# Patient Record
Sex: Female | Born: 1950 | Race: White | Hispanic: No | Smoking: Former smoker
Health system: Southern US, Community
[De-identification: ages and names within clinical notes are randomized; demographics above are authoritative.]

## PROBLEM LIST (undated history)

## (undated) DIAGNOSIS — K635 Polyp of colon: Secondary | ICD-10-CM

## (undated) DIAGNOSIS — A64 Unspecified sexually transmitted disease: Secondary | ICD-10-CM

## (undated) DIAGNOSIS — M1990 Unspecified osteoarthritis, unspecified site: Secondary | ICD-10-CM

## (undated) DIAGNOSIS — F419 Anxiety disorder, unspecified: Secondary | ICD-10-CM

## (undated) DIAGNOSIS — E7800 Pure hypercholesterolemia, unspecified: Secondary | ICD-10-CM

## (undated) DIAGNOSIS — J449 Chronic obstructive pulmonary disease, unspecified: Secondary | ICD-10-CM

## (undated) DIAGNOSIS — T7840XA Allergy, unspecified, initial encounter: Secondary | ICD-10-CM

## (undated) DIAGNOSIS — S20219A Contusion of unspecified front wall of thorax, initial encounter: Secondary | ICD-10-CM

## (undated) DIAGNOSIS — N890 Mild vaginal dysplasia: Secondary | ICD-10-CM

## (undated) DIAGNOSIS — F988 Other specified behavioral and emotional disorders with onset usually occurring in childhood and adolescence: Secondary | ICD-10-CM

## (undated) DIAGNOSIS — F329 Major depressive disorder, single episode, unspecified: Secondary | ICD-10-CM

## (undated) DIAGNOSIS — F1910 Other psychoactive substance abuse, uncomplicated: Secondary | ICD-10-CM

## (undated) DIAGNOSIS — F0390 Unspecified dementia without behavioral disturbance: Secondary | ICD-10-CM

## (undated) DIAGNOSIS — D649 Anemia, unspecified: Secondary | ICD-10-CM

## (undated) DIAGNOSIS — F32A Depression, unspecified: Secondary | ICD-10-CM

## (undated) DIAGNOSIS — H269 Unspecified cataract: Secondary | ICD-10-CM

## (undated) DIAGNOSIS — B029 Zoster without complications: Secondary | ICD-10-CM

## (undated) HISTORY — DX: Pure hypercholesterolemia, unspecified: E78.00

## (undated) HISTORY — DX: Unspecified cataract: H26.9

## (undated) HISTORY — DX: Anxiety disorder, unspecified: F41.9

## (undated) HISTORY — DX: Allergy, unspecified, initial encounter: T78.40XA

## (undated) HISTORY — DX: Mild vaginal dysplasia: N89.0

## (undated) HISTORY — PX: ACHILLES TENDON SURGERY: SHX542

## (undated) HISTORY — DX: Polyp of colon: K63.5

## (undated) HISTORY — DX: Anemia, unspecified: D64.9

## (undated) HISTORY — PX: POLYPECTOMY: SHX149

## (undated) HISTORY — DX: Unspecified osteoarthritis, unspecified site: M19.90

## (undated) HISTORY — PX: TONSILLECTOMY AND ADENOIDECTOMY: SUR1326

## (undated) HISTORY — DX: Other specified behavioral and emotional disorders with onset usually occurring in childhood and adolescence: F98.8

## (undated) HISTORY — DX: Unspecified dementia, unspecified severity, without behavioral disturbance, psychotic disturbance, mood disturbance, and anxiety: F03.90

## (undated) HISTORY — PX: AUGMENTATION MAMMAPLASTY: SUR837

## (undated) HISTORY — DX: Depression, unspecified: F32.A

## (undated) HISTORY — DX: Major depressive disorder, single episode, unspecified: F32.9

## (undated) HISTORY — DX: Zoster without complications: B02.9

## (undated) HISTORY — DX: Contusion of unspecified front wall of thorax, initial encounter: S20.219A

## (undated) HISTORY — DX: Unspecified sexually transmitted disease: A64

## (undated) HISTORY — DX: Other psychoactive substance abuse, uncomplicated: F19.10

## (undated) HISTORY — DX: Chronic obstructive pulmonary disease, unspecified: J44.9

## (undated) HISTORY — PX: TUBAL LIGATION: SHX77

---

## 1997-03-05 HISTORY — PX: ABDOMINAL SURGERY: SHX537

## 1997-03-05 HISTORY — PX: ABDOMINAL HYSTERECTOMY: SHX81

## 1997-10-20 ENCOUNTER — Encounter: Admission: RE | Admit: 1997-10-20 | Discharge: 1997-10-20 | Payer: Self-pay | Admitting: Family Medicine

## 1997-10-28 ENCOUNTER — Inpatient Hospital Stay (HOSPITAL_COMMUNITY): Admission: RE | Admit: 1997-10-28 | Discharge: 1997-10-31 | Payer: Self-pay | Admitting: Obstetrics and Gynecology

## 1998-09-13 ENCOUNTER — Other Ambulatory Visit: Admission: RE | Admit: 1998-09-13 | Discharge: 1998-09-13 | Payer: Self-pay | Admitting: Obstetrics and Gynecology

## 1999-05-08 ENCOUNTER — Encounter: Payer: Self-pay | Admitting: Obstetrics and Gynecology

## 1999-05-08 ENCOUNTER — Encounter: Admission: RE | Admit: 1999-05-08 | Discharge: 1999-05-08 | Payer: Self-pay | Admitting: Obstetrics and Gynecology

## 1999-09-13 ENCOUNTER — Other Ambulatory Visit: Admission: RE | Admit: 1999-09-13 | Discharge: 1999-09-13 | Payer: Self-pay | Admitting: Obstetrics and Gynecology

## 2000-09-24 ENCOUNTER — Other Ambulatory Visit: Admission: RE | Admit: 2000-09-24 | Discharge: 2000-09-24 | Payer: Self-pay | Admitting: Obstetrics and Gynecology

## 2000-11-13 ENCOUNTER — Encounter: Admission: RE | Admit: 2000-11-13 | Discharge: 2000-11-13 | Payer: Self-pay | Admitting: Obstetrics and Gynecology

## 2000-11-13 ENCOUNTER — Encounter: Payer: Self-pay | Admitting: Obstetrics and Gynecology

## 2000-11-19 ENCOUNTER — Encounter: Payer: Self-pay | Admitting: Obstetrics and Gynecology

## 2000-11-19 ENCOUNTER — Encounter: Admission: RE | Admit: 2000-11-19 | Discharge: 2000-11-19 | Payer: Self-pay | Admitting: Obstetrics and Gynecology

## 2001-09-25 ENCOUNTER — Other Ambulatory Visit: Admission: RE | Admit: 2001-09-25 | Discharge: 2001-09-25 | Payer: Self-pay | Admitting: Obstetrics and Gynecology

## 2002-02-05 ENCOUNTER — Ambulatory Visit (HOSPITAL_BASED_OUTPATIENT_CLINIC_OR_DEPARTMENT_OTHER): Admission: RE | Admit: 2002-02-05 | Discharge: 2002-02-05 | Payer: Self-pay | Admitting: Plastic Surgery

## 2002-10-16 ENCOUNTER — Other Ambulatory Visit: Admission: RE | Admit: 2002-10-16 | Discharge: 2002-10-16 | Payer: Self-pay | Admitting: Obstetrics and Gynecology

## 2003-10-18 ENCOUNTER — Other Ambulatory Visit: Admission: RE | Admit: 2003-10-18 | Discharge: 2003-10-18 | Payer: Self-pay | Admitting: Obstetrics and Gynecology

## 2004-03-05 DIAGNOSIS — N890 Mild vaginal dysplasia: Secondary | ICD-10-CM

## 2004-03-05 HISTORY — PX: COLONOSCOPY: SHX174

## 2004-03-05 HISTORY — DX: Mild vaginal dysplasia: N89.0

## 2004-10-24 ENCOUNTER — Other Ambulatory Visit: Admission: RE | Admit: 2004-10-24 | Discharge: 2004-10-24 | Payer: Self-pay | Admitting: Obstetrics and Gynecology

## 2004-11-15 ENCOUNTER — Ambulatory Visit: Payer: Self-pay | Admitting: Pulmonary Disease

## 2004-11-16 ENCOUNTER — Ambulatory Visit: Payer: Self-pay | Admitting: Pulmonary Disease

## 2004-11-29 ENCOUNTER — Other Ambulatory Visit: Admission: RE | Admit: 2004-11-29 | Discharge: 2004-11-29 | Payer: Self-pay | Admitting: Obstetrics and Gynecology

## 2004-12-01 ENCOUNTER — Ambulatory Visit: Payer: Self-pay | Admitting: Gastroenterology

## 2004-12-14 ENCOUNTER — Ambulatory Visit: Payer: Self-pay | Admitting: Gastroenterology

## 2004-12-14 ENCOUNTER — Encounter (INDEPENDENT_AMBULATORY_CARE_PROVIDER_SITE_OTHER): Payer: Self-pay | Admitting: Specialist

## 2005-05-30 ENCOUNTER — Other Ambulatory Visit: Admission: RE | Admit: 2005-05-30 | Discharge: 2005-05-30 | Payer: Self-pay | Admitting: Obstetrics and Gynecology

## 2005-06-22 ENCOUNTER — Ambulatory Visit: Payer: Self-pay | Admitting: Pulmonary Disease

## 2005-07-02 ENCOUNTER — Ambulatory Visit: Payer: Self-pay | Admitting: Pulmonary Disease

## 2005-11-08 ENCOUNTER — Other Ambulatory Visit: Admission: RE | Admit: 2005-11-08 | Discharge: 2005-11-08 | Payer: Self-pay | Admitting: Obstetrics and Gynecology

## 2006-05-21 ENCOUNTER — Other Ambulatory Visit: Admission: RE | Admit: 2006-05-21 | Discharge: 2006-05-21 | Payer: Self-pay | Admitting: Obstetrics and Gynecology

## 2006-09-27 ENCOUNTER — Ambulatory Visit: Payer: Self-pay | Admitting: Pulmonary Disease

## 2006-09-27 LAB — CONVERTED CEMR LAB
ALT: 17 units/L (ref 0–35)
AST: 20 units/L (ref 0–37)
Albumin: 3.4 g/dL — ABNORMAL LOW (ref 3.5–5.2)
Alkaline Phosphatase: 55 units/L (ref 39–117)
BUN: 11 mg/dL (ref 6–23)
CO2: 30 meq/L (ref 19–32)
Calcium: 8.7 mg/dL (ref 8.4–10.5)
Chloride: 106 meq/L (ref 96–112)
Cholesterol: 208 mg/dL (ref 0–200)
Creatinine, Ser: 0.8 mg/dL (ref 0.4–1.2)
Direct LDL: 122.1 mg/dL
GFR calc Af Amer: 96 mL/min
GFR calc non Af Amer: 79 mL/min
Glucose, Bld: 101 mg/dL — ABNORMAL HIGH (ref 70–99)
HDL: 64.1 mg/dL (ref >39.0)
Potassium: 4.6 meq/L (ref 3.5–5.1)
Sodium: 142 meq/L (ref 135–145)
Total Bilirubin: 0.7 mg/dL (ref 0.3–1.2)
Total CHOL/HDL Ratio: 3.2
Total Protein: 6 g/dL (ref 6.0–8.3)
Triglycerides: 90 mg/dL (ref 0–149)
VLDL: 18 mg/dL (ref 0–40)

## 2006-10-09 ENCOUNTER — Ambulatory Visit: Payer: Self-pay | Admitting: Pulmonary Disease

## 2006-10-14 ENCOUNTER — Ambulatory Visit: Payer: Self-pay | Admitting: Family Medicine

## 2006-11-13 ENCOUNTER — Other Ambulatory Visit: Admission: RE | Admit: 2006-11-13 | Discharge: 2006-11-13 | Payer: Self-pay | Admitting: Obstetrics and Gynecology

## 2007-01-24 ENCOUNTER — Ambulatory Visit: Payer: Self-pay | Admitting: Pulmonary Disease

## 2007-01-24 LAB — CONVERTED CEMR LAB
ALT: 19 units/L (ref 0–35)
AST: 21 units/L (ref 0–37)
Albumin: 3.5 g/dL (ref 3.5–5.2)
Alkaline Phosphatase: 60 units/L (ref 39–117)
Bilirubin, Direct: 0.1 mg/dL (ref 0.0–0.3)
Cholesterol: 201 mg/dL (ref 0–200)
Direct LDL: 115.7 mg/dL
HDL: 76.3 mg/dL (ref >39.0)
Total Bilirubin: 0.9 mg/dL (ref 0.3–1.2)
Total CHOL/HDL Ratio: 2.6
Total Protein: 6.2 g/dL (ref 6.0–8.3)
Triglycerides: 95 mg/dL (ref 0–149)
VLDL: 19 mg/dL (ref 0–40)

## 2007-07-05 ENCOUNTER — Encounter: Payer: Self-pay | Admitting: Pulmonary Disease

## 2007-07-05 DIAGNOSIS — F988 Other specified behavioral and emotional disorders with onset usually occurring in childhood and adolescence: Secondary | ICD-10-CM | POA: Insufficient documentation

## 2007-07-05 DIAGNOSIS — L259 Unspecified contact dermatitis, unspecified cause: Secondary | ICD-10-CM

## 2007-07-05 DIAGNOSIS — E7800 Pure hypercholesterolemia, unspecified: Secondary | ICD-10-CM

## 2007-07-05 DIAGNOSIS — Z87898 Personal history of other specified conditions: Secondary | ICD-10-CM

## 2007-10-09 ENCOUNTER — Telehealth: Payer: Self-pay | Admitting: Pulmonary Disease

## 2007-10-15 ENCOUNTER — Ambulatory Visit: Payer: Self-pay | Admitting: Pulmonary Disease

## 2007-10-21 ENCOUNTER — Telehealth: Payer: Self-pay | Admitting: Pulmonary Disease

## 2007-10-22 LAB — CONVERTED CEMR LAB
ALT: 21 units/L (ref 0–35)
AST: 21 units/L (ref 0–37)
Albumin: 3.8 g/dL (ref 3.5–5.2)
Alkaline Phosphatase: 49 units/L (ref 39–117)
BUN: 7 mg/dL (ref 6–23)
Bilirubin, Direct: 0.1 mg/dL (ref 0.0–0.3)
CO2: 31 meq/L (ref 19–32)
Calcium: 9.2 mg/dL (ref 8.4–10.5)
Chloride: 107 meq/L (ref 96–112)
Cholesterol: 173 mg/dL (ref 0–200)
Creatinine, Ser: 0.9 mg/dL (ref 0.4–1.2)
GFR calc Af Amer: 83 mL/min
GFR calc non Af Amer: 69 mL/min
Glucose, Bld: 94 mg/dL (ref 70–99)
HDL: 56.5 mg/dL (ref >39.0)
LDL Cholesterol: 95 mg/dL (ref 0–99)
Potassium: 4.5 meq/L (ref 3.5–5.1)
Sodium: 144 meq/L (ref 135–145)
Total Bilirubin: 0.6 mg/dL (ref 0.3–1.2)
Total CHOL/HDL Ratio: 3.1
Total Protein: 6.2 g/dL (ref 6.0–8.3)
Triglycerides: 107 mg/dL (ref 0–149)
VLDL: 21 mg/dL (ref 0–40)

## 2007-11-14 ENCOUNTER — Other Ambulatory Visit: Admission: RE | Admit: 2007-11-14 | Discharge: 2007-11-14 | Payer: Self-pay | Admitting: Obstetrics and Gynecology

## 2007-11-14 ENCOUNTER — Ambulatory Visit: Payer: Self-pay | Admitting: Obstetrics and Gynecology

## 2007-11-14 ENCOUNTER — Encounter: Payer: Self-pay | Admitting: Obstetrics and Gynecology

## 2007-12-16 ENCOUNTER — Ambulatory Visit: Payer: Self-pay | Admitting: Pulmonary Disease

## 2007-12-21 DIAGNOSIS — M459 Ankylosing spondylitis of unspecified sites in spine: Secondary | ICD-10-CM

## 2007-12-21 DIAGNOSIS — J309 Allergic rhinitis, unspecified: Secondary | ICD-10-CM | POA: Insufficient documentation

## 2007-12-21 DIAGNOSIS — D126 Benign neoplasm of colon, unspecified: Secondary | ICD-10-CM

## 2008-03-04 ENCOUNTER — Telehealth: Payer: Self-pay | Admitting: Pulmonary Disease

## 2008-04-08 ENCOUNTER — Encounter: Payer: Self-pay | Admitting: Pulmonary Disease

## 2008-04-08 ENCOUNTER — Telehealth: Payer: Self-pay | Admitting: Pulmonary Disease

## 2008-07-07 ENCOUNTER — Telehealth: Payer: Self-pay | Admitting: Pulmonary Disease

## 2008-07-12 ENCOUNTER — Encounter: Payer: Self-pay | Admitting: Pulmonary Disease

## 2008-09-15 ENCOUNTER — Telehealth: Payer: Self-pay | Admitting: Pulmonary Disease

## 2008-11-01 ENCOUNTER — Telehealth: Payer: Self-pay | Admitting: Pulmonary Disease

## 2008-11-01 ENCOUNTER — Ambulatory Visit: Payer: Self-pay | Admitting: Family Medicine

## 2008-11-01 ENCOUNTER — Encounter: Payer: Self-pay | Admitting: Pulmonary Disease

## 2008-11-05 ENCOUNTER — Ambulatory Visit: Payer: Self-pay | Admitting: Pulmonary Disease

## 2008-11-22 ENCOUNTER — Ambulatory Visit: Payer: Self-pay | Admitting: Obstetrics and Gynecology

## 2008-11-22 ENCOUNTER — Other Ambulatory Visit: Admission: RE | Admit: 2008-11-22 | Discharge: 2008-11-22 | Payer: Self-pay | Admitting: Obstetrics and Gynecology

## 2008-11-22 ENCOUNTER — Encounter: Payer: Self-pay | Admitting: Obstetrics and Gynecology

## 2008-12-10 ENCOUNTER — Telehealth: Payer: Self-pay | Admitting: Pulmonary Disease

## 2008-12-20 ENCOUNTER — Ambulatory Visit: Payer: Self-pay | Admitting: Pulmonary Disease

## 2008-12-20 LAB — CONVERTED CEMR LAB
ALT: 17 units/L (ref 0–35)
AST: 20 units/L (ref 0–37)
Albumin: 4 g/dL (ref 3.5–5.2)
Alkaline Phosphatase: 62 units/L (ref 39–117)
Bilirubin, Direct: 0.1 mg/dL (ref 0.0–0.3)
Cholesterol: 220 mg/dL — ABNORMAL HIGH (ref 0–200)
Direct LDL: 138.3 mg/dL
HDL: 58 mg/dL (ref >39.00)
Total Bilirubin: 1 mg/dL (ref 0.3–1.2)
Total CHOL/HDL Ratio: 4
Total Protein: 6.8 g/dL (ref 6.0–8.3)
Triglycerides: 179 mg/dL — ABNORMAL HIGH (ref 0.0–149.0)
VLDL: 35.8 mg/dL (ref 0.0–40.0)

## 2008-12-31 ENCOUNTER — Encounter: Payer: Self-pay | Admitting: Pulmonary Disease

## 2009-01-03 ENCOUNTER — Ambulatory Visit: Payer: Self-pay | Admitting: Pulmonary Disease

## 2009-01-03 LAB — CONVERTED CEMR LAB
ALT: 19 units/L (ref 0–35)
AST: 20 units/L (ref 0–37)
Albumin: 4.5 g/dL (ref 3.5–5.2)
Alkaline Phosphatase: 59 units/L (ref 39–117)
BUN: 14 mg/dL (ref 6–23)
Basophils Absolute: 0 10*3/uL (ref 0.0–0.1)
Basophils Relative: 0 % (ref 0.0–3.0)
Bilirubin, Direct: 0.1 mg/dL (ref 0.0–0.3)
CO2: 29 meq/L (ref 19–32)
Calcium: 9.1 mg/dL (ref 8.4–10.5)
Chloride: 102 meq/L (ref 96–112)
Creatinine, Ser: 0.9 mg/dL (ref 0.4–1.2)
Eosinophils Absolute: 0.1 10*3/uL (ref 0.0–0.7)
Eosinophils Relative: 0.5 % (ref 0.0–5.0)
GFR calc non Af Amer: 68.49 mL/min (ref >60)
Glucose, Bld: 88 mg/dL (ref 70–99)
HCT: 47.5 % — ABNORMAL HIGH (ref 36.0–46.0)
Hemoglobin: 16.4 g/dL — ABNORMAL HIGH (ref 12.0–15.0)
Lymphocytes Relative: 15.6 % (ref 12.0–46.0)
Lymphs Abs: 1.9 10*3/uL (ref 0.7–4.0)
MCHC: 34.4 g/dL (ref 30.0–36.0)
MCV: 98.1 fL (ref 78.0–100.0)
Monocytes Absolute: 0.3 10*3/uL (ref 0.1–1.0)
Monocytes Relative: 2.5 % — ABNORMAL LOW (ref 3.0–12.0)
Neutro Abs: 9.8 10*3/uL — ABNORMAL HIGH (ref 1.4–7.7)
Neutrophils Relative %: 81.4 % — ABNORMAL HIGH (ref 43.0–77.0)
Platelets: 258 10*3/uL (ref 150.0–400.0)
Potassium: 4.3 meq/L (ref 3.5–5.1)
RBC: 4.84 M/uL (ref 3.87–5.11)
RDW: 11.4 % — ABNORMAL LOW (ref 11.5–14.6)
Sodium: 142 meq/L (ref 135–145)
TSH: 1.19 microintl units/mL (ref 0.35–5.50)
Total Bilirubin: 1.1 mg/dL (ref 0.3–1.2)
Total Protein: 7.6 g/dL (ref 6.0–8.3)
Vit D, 25-Hydroxy: 40 ng/mL (ref 30–89)
WBC: 12.1 10*3/uL — ABNORMAL HIGH (ref 4.5–10.5)

## 2009-01-04 LAB — CONVERTED CEMR LAB
Cholesterol: 208 mg/dL — ABNORMAL HIGH (ref 0–200)
Direct LDL: 120 mg/dL
HDL: 58 mg/dL (ref >39.00)
Total CHOL/HDL Ratio: 4
Triglycerides: 241 mg/dL — ABNORMAL HIGH (ref 0.0–149.0)
VLDL: 48.2 mg/dL — ABNORMAL HIGH (ref 0.0–40.0)

## 2009-03-31 ENCOUNTER — Telehealth (INDEPENDENT_AMBULATORY_CARE_PROVIDER_SITE_OTHER): Payer: Self-pay | Admitting: *Deleted

## 2009-06-17 ENCOUNTER — Ambulatory Visit: Payer: Self-pay | Admitting: Pulmonary Disease

## 2009-06-21 ENCOUNTER — Telehealth (INDEPENDENT_AMBULATORY_CARE_PROVIDER_SITE_OTHER): Payer: Self-pay | Admitting: *Deleted

## 2009-06-21 LAB — CONVERTED CEMR LAB
ALT: 16 units/L (ref 0–35)
Alkaline Phosphatase: 54 units/L (ref 39–117)
Bilirubin, Direct: 0.1 mg/dL (ref 0.0–0.3)
HDL: 76.2 mg/dL (ref >39.00)
Total Bilirubin: 0.8 mg/dL (ref 0.3–1.2)
Total Protein: 6.6 g/dL (ref 6.0–8.3)

## 2009-09-09 ENCOUNTER — Encounter: Payer: Self-pay | Admitting: Pulmonary Disease

## 2009-09-09 ENCOUNTER — Telehealth (INDEPENDENT_AMBULATORY_CARE_PROVIDER_SITE_OTHER): Payer: Self-pay | Admitting: *Deleted

## 2009-11-29 ENCOUNTER — Other Ambulatory Visit: Admission: RE | Admit: 2009-11-29 | Discharge: 2009-11-29 | Payer: Self-pay | Admitting: Obstetrics and Gynecology

## 2009-11-29 ENCOUNTER — Ambulatory Visit: Payer: Self-pay | Admitting: Obstetrics and Gynecology

## 2009-12-15 ENCOUNTER — Encounter: Payer: Self-pay | Admitting: Pulmonary Disease

## 2009-12-21 ENCOUNTER — Telehealth: Payer: Self-pay | Admitting: Pulmonary Disease

## 2009-12-27 ENCOUNTER — Ambulatory Visit: Payer: Self-pay | Admitting: Pulmonary Disease

## 2010-01-31 ENCOUNTER — Encounter: Payer: Self-pay | Admitting: Gastroenterology

## 2010-03-22 ENCOUNTER — Telehealth: Payer: Self-pay | Admitting: Pulmonary Disease

## 2010-03-24 ENCOUNTER — Telehealth (INDEPENDENT_AMBULATORY_CARE_PROVIDER_SITE_OTHER): Payer: Self-pay | Admitting: *Deleted

## 2010-04-04 NOTE — Progress Notes (Signed)
Summary: adderall  Phone Note Call from Patient Call back at Home Phone [phone]   Caller: Patient Call For: nadel Summary of Call: pt needs 3 rx's for adderall 20mg - mailed to pt's home address (which i have verified). call pt at home # if needed or cell [phone] Initial call taken by: Tivis Ringer, CNA,  December 21, 2009 4:44 PM  Follow-up for Phone Call        3 rx have been printed out for 11-4, 12-4 and 03-08-2010 to not fill before these dates.  rx have been mailed to pt per her request. Randell Loop CMA  December 22, 2009 9:09 AM     New/Updated Medications: ADDERALL XR 20 MG XR24H-CAP (AMPHETAMINE-DEXTROAMPHETAMINE) Take 1 tablet by mouth once a day... **DO NOT FILL BEFORE 01-06-2010** ADDERALL XR 20 MG XR24H-CAP (AMPHETAMINE-DEXTROAMPHETAMINE) Take 1 tablet by mouth once a day... **DO NOT FILL BEFORE 02-05-2010** ADDERALL XR 20 MG XR24H-CAP (AMPHETAMINE-DEXTROAMPHETAMINE) Take 1 tablet by mouth once a day... **DO NOT FILL BEFORE 03-08-2010** Prescriptions: ADDERALL XR 20 MG XR24H-CAP (AMPHETAMINE-DEXTROAMPHETAMINE) Take 1 tablet by mouth once a day... **DO NOT FILL BEFORE 03-08-2010**  #30 x 0   Entered by:   Randell Loop CMA   Authorized by:   Michele Mcalpine MD   Signed by:   Randell Loop CMA on 12/22/2009   Method used:   Print then Give to Patient   RxID:   [id number] ADDERALL XR 20 MG XR24H-CAP (AMPHETAMINE-DEXTROAMPHETAMINE) Take 1 tablet by mouth once a day... **DO NOT FILL BEFORE 02-05-2010**  #30 x 0   Entered by:   Randell Loop CMA   Authorized by:   Michele Mcalpine MD   Signed by:   Randell Loop CMA on 12/22/2009   Method used:   Print then Give to Patient   RxID:   [id number] ADDERALL XR 20 MG XR24H-CAP (AMPHETAMINE-DEXTROAMPHETAMINE) Take 1 tablet by mouth once a day... **DO NOT FILL BEFORE 01-06-2010**  #30 x 0   Entered by:   Randell Loop CMA   Authorized by:   Michele Mcalpine MD   Signed by:   Randell Loop CMA on 12/22/2009   Method used:    Print then Give to Patient   RxID:   [id number] ADDERALL XR 20 MG XR24H-CAP (AMPHETAMINE-DEXTROAMPHETAMINE) Take 1 tablet by mouth once a day... **DO NOT FILL BEFORE 12-06-2009**  #30 x 0   Entered by:   Randell Loop CMA   Authorized by:   Michele Mcalpine MD   Signed by:   Randell Loop CMA on 12/22/2009   Method used:   Print then Give to Patient   RxID:   [id number]  this rx was printed out wrong so this was placed in the recycle bin. Randell Loop CMA  December 22, 2009 9:00 AM

## 2010-04-04 NOTE — Letter (Signed)
Summary: Colonoscopy Date Change Letter  [facility]  [address] [place], [place] [address]   Phone: [phone]  Fax: [phone]      January 31, 2010 MRN: [id number]   Desiree Salinas [address] [place], [place]  [address]   Dear Desiree Salinas,   Previously you were recommended to have a repeat colonoscopy around this time. Your chart was recently reviewed by Dr.Dylanie Quesenberry of [facility]. Follow up colonoscopy is now recommended in 12-2014. This revised recommendation is based on current, nationally recognized guidelines for colorectal cancer screening and polyp surveillance. These guidelines are endorsed by the American Cancer Society, [employer] on Colorectal Cancer as well as numerous other major medical organizations.  Please understand that our recommendation assumes that you do not have any new symptoms such as bleeding, a change in bowel habits, anemia, or significant abdominal discomfort. If you do have any concerning GI symptoms or want to discuss the guideline recommendations, please call to arrange an office visit at your earliest convenience. Otherwise we will keep you in our reminder system and contact you 1-2 months prior to the date listed above to schedule your next colonoscopy.  Thank you,  Barbette Hair. Arlyce Dice, M.D.  [facility] [phone]

## 2010-04-04 NOTE — Miscellaneous (Signed)
  Medications Added ADDERALL XR 20 MG XR24H-CAP (AMPHETAMINE-DEXTROAMPHETAMINE) Take 1 tablet by mouth once a day... **DO NOT FILL BEFORE 10-06-2009** ADDERALL XR 20 MG XR24H-CAP (AMPHETAMINE-DEXTROAMPHETAMINE) Take 1 tablet by mouth once a day... **DO NOT FILL BEFORE 11-06-2009** ADDERALL XR 20 MG XR24H-CAP (AMPHETAMINE-DEXTROAMPHETAMINE) Take 1 tablet by mouth once a day... **DO NOT FILL BEFORE 12-06-2009**       Clinical Lists Changes  Medications: Changed medication from ADDERALL XR 20 MG XR24H-CAP (AMPHETAMINE-DEXTROAMPHETAMINE) Take 1 tablet by mouth once a day... **DO NOT FILL BEFORE 09-05-2009** to ADDERALL XR 20 MG XR24H-CAP (AMPHETAMINE-DEXTROAMPHETAMINE) Take 1 tablet by mouth once a day... **DO NOT FILL BEFORE 10-06-2009** - Signed Changed medication from ADDERALL XR 20 MG XR24H-CAP (AMPHETAMINE-DEXTROAMPHETAMINE) Take 1 tablet by mouth once a day... **DO NOT FILL BEFORE 10-06-2009** to ADDERALL XR 20 MG XR24H-CAP (AMPHETAMINE-DEXTROAMPHETAMINE) Take 1 tablet by mouth once a day... **DO NOT FILL BEFORE 11-06-2009** - Signed Changed medication from ADDERALL XR 20 MG XR24H-CAP (AMPHETAMINE-DEXTROAMPHETAMINE) Take 1 tablet by mouth once a day... **DO NOT FILL BEFORE 11-06-2009** to ADDERALL XR 20 MG XR24H-CAP (AMPHETAMINE-DEXTROAMPHETAMINE) Take 1 tablet by mouth once a day... **DO NOT FILL BEFORE 12-06-2009** - Signed Rx of ADDERALL XR 20 MG XR24H-CAP (AMPHETAMINE-DEXTROAMPHETAMINE) Take 1 tablet by mouth once a day... **DO NOT FILL BEFORE 10-06-2009**;  #30 x 0;  Signed;  Entered by: Michele Mcalpine MD;  Authorized by: Michele Mcalpine MD;  Method used: Print then Give to Patient Rx of ADDERALL XR 20 MG XR24H-CAP (AMPHETAMINE-DEXTROAMPHETAMINE) Take 1 tablet by mouth once a day... **DO NOT FILL BEFORE 11-06-2009**;  #30 x 0;  Signed;  Entered by: Michele Mcalpine MD;  Authorized by: Michele Mcalpine MD;  Method used: Print then Give to Patient Rx of ADDERALL XR 20 MG XR24H-CAP  (AMPHETAMINE-DEXTROAMPHETAMINE) Take 1 tablet by mouth once a day... **DO NOT FILL BEFORE 12-06-2009**;  #30 x 0;  Signed;  Entered by: Michele Mcalpine MD;  Authorized by: Michele Mcalpine MD;  Method used: Print then Give to Patient Observations: Added new observation of LLIMPORTMEDS: completed (09/09/2009 15:00)    Prescriptions: ADDERALL XR 20 MG XR24H-CAP (AMPHETAMINE-DEXTROAMPHETAMINE) Take 1 tablet by mouth once a day... **DO NOT FILL BEFORE 12-06-2009**  #30 x 0   Entered and Authorized by:   Michele Mcalpine MD   Signed by:   Michele Mcalpine MD on 09/09/2009   Method used:   Print then Give to Patient   RxID:   [id number] ADDERALL XR 20 MG XR24H-CAP (AMPHETAMINE-DEXTROAMPHETAMINE) Take 1 tablet by mouth once a day... **DO NOT FILL BEFORE 11-06-2009**  #30 x 0   Entered and Authorized by:   Michele Mcalpine MD   Signed by:   Michele Mcalpine MD on 09/09/2009   Method used:   Print then Give to Patient   RxID:   [id number] ADDERALL XR 20 MG XR24H-CAP (AMPHETAMINE-DEXTROAMPHETAMINE) Take 1 tablet by mouth once a day... **DO NOT FILL BEFORE 10-06-2009**  #30 x 0   Entered and Authorized by:   Michele Mcalpine MD   Signed by:   Michele Mcalpine MD on 09/09/2009   Method used:   Print then Give to Patient   RxID:   [id number]

## 2010-04-04 NOTE — Progress Notes (Signed)
Summary: lab results/ pick up rx's   Phone Note Call from Patient   Caller: Patient Call For: nadel Summary of Call: pt is "swinging by" in about 1 hour to pick up her rx's for adderall (this is already in envelope up front here). she also wants her lab results at this time as well. cell [phone] Initial call taken by: Tivis Ringer, CNA,  June 21, 2009 9:59 AM  Follow-up for Phone Call        Lab results from 4/15 unsigned in EMR.  Pt requesting results and recs.  Pt states she will be coming by to pick up Adderall rxs.  WIll forward message to SN - pls advise.  Thanks! Gweneth Dimitri RN  June 21, 2009 10:07 AM   Additional Follow-up for Phone Call Additional follow up Details #1::        lab results with pts rx up front for her to pick up Randell Loop CMA  June 21, 2009 11:01 AM

## 2010-04-04 NOTE — Progress Notes (Signed)
Summary: refills  Phone Note Call from Patient Call back at Home Phone [phone]   Caller: Patient Call For: Desiree Salinas Summary of Call: 3 rx's adderall xr pt will pick that up and send refill simvastatin  [facility] and does she need blood checked Initial call taken by: Lacinda Axon,  March 31, 2009 5:19 PM  Follow-up for Phone Call        there was a problem with printing out these rx----there were only 3 rx that were actually printed out for this pt and given to SN to sign---adderall rx for do not fill before 04-08-2009, 05-06-2009 and 06-06-2009.  simvastatin was sent to pts pharmacy per her request and she will not need to come in for labs until sometime in march and pt is aware of this also.  will call pt once the rx have been signed. Randell Loop CMA  April 01, 2009 9:18 AM   Additional Follow-up for Phone Call Additional follow up Details #1::        rx are up front and ready to be picked up and pt is aware Randell Loop CMA  April 01, 2009 11:22 AM     New/Updated Medications: ADDERALL XR 20 MG XR24H-CAP (AMPHETAMINE-DEXTROAMPHETAMINE) Take 1 tablet by mouth once a day... **DO NOT FILL BEFORE 06-06-2009** ADDERALL XR 20 MG XR24H-CAP (AMPHETAMINE-DEXTROAMPHETAMINE) Take 1 tablet by mouth once a day... **DO NOT FILL BEFORE 05-06-2009** ADDERALL XR 20 MG XR24H-CAP (AMPHETAMINE-DEXTROAMPHETAMINE) Take 1 tablet by mouth once a day... **DO NOT FILL BEFORE 04-08-2009** Prescriptions: ADDERALL XR 20 MG XR24H-CAP (AMPHETAMINE-DEXTROAMPHETAMINE) Take 1 tablet by mouth once a day... **DO NOT FILL BEFORE 04-08-2009**  #30 x 0   Entered by:   Randell Loop CMA   Authorized by:   Michele Mcalpine MD   Signed by:   Randell Loop CMA on 04/01/2009   Method used:   Print then Give to Patient   RxID:   [id number] ADDERALL XR 20 MG XR24H-CAP (AMPHETAMINE-DEXTROAMPHETAMINE) Take 1 tablet by mouth once a day... **DO NOT FILL BEFORE 05-06-2009**  #30 x 0   Entered by:   Randell Loop CMA  Authorized by:   Michele Mcalpine MD   Signed by:   Randell Loop CMA on 04/01/2009   Method used:   Print then Give to Patient   RxID:   [id number] ADDERALL XR 20 MG XR24H-CAP (AMPHETAMINE-DEXTROAMPHETAMINE) Take 1 tablet by mouth once a day... **DO NOT FILL BEFORE 06-06-2009**  #30 x 0   Entered by:   Randell Loop CMA   Authorized by:   Michele Mcalpine MD   Signed by:   Randell Loop CMA on 04/01/2009   Method used:   Print then Give to Patient   RxID:   [id number] ADDERALL XR 20 MG XR24H-CAP (AMPHETAMINE-DEXTROAMPHETAMINE) Take 1 tablet by mouth once a day... **DO NOT FILL BEFORE 02/05/09**  #30 x 0   Entered by:   Vernie Murders   Authorized by:   Michele Mcalpine MD   Signed by:   Vernie Murders on 04/01/2009   Method used:   Print then Give to Patient   RxID:   [id number] SIMVASTATIN 40 MG  TABS (SIMVASTATIN) Take 1 tablet by mouth once a day  #90 x 3   Entered by:   Vernie Murders   Authorized by:   Michele Mcalpine MD   Signed by:   Vernie Murders on 04/01/2009   Method used:   Electronically to  [facility]* (retail)       [address]       [place], [place]  [address]       Ph: [phone]       Fax: [phone]   RxID:   [id number] ADDERALL XR 20 MG XR24H-CAP (AMPHETAMINE-DEXTROAMPHETAMINE) Take 1 tablet by mouth once a day... **DO NOT FILL BEFORE 02/05/09**  #30 x 0   Entered by:   Vernie Murders   Authorized by:   Michele Mcalpine MD   Signed by:   Vernie Murders on 04/01/2009   Method used:   Print then Give to Patient   RxID:   [id number] ADDERALL XR 20 MG XR24H-CAP (AMPHETAMINE-DEXTROAMPHETAMINE) Take 1 tablet by mouth once a day... **DO NOT FILL BEFORE 02/05/09**  #30 x 0   Entered by:   Vernie Murders   Authorized by:   Michele Mcalpine MD   Signed by:   Vernie Murders on 04/01/2009   Method used:   Print then Give to Patient   RxID:   [id number] ADDERALL XR 20 MG XR24H-CAP (AMPHETAMINE-DEXTROAMPHETAMINE) Take 1 tablet by mouth  once a day... **DO NOT FILL BEFORE 02/05/09**  #30 x 0   Entered by:   Vernie Murders   Authorized by:   Michele Mcalpine MD   Signed by:   Vernie Murders on 04/01/2009   Method used:   Print then Give to Patient   RxID:   [id number]

## 2010-04-04 NOTE — Assessment & Plan Note (Signed)
Summary: flu shot ///kp  Nurse Visit   Allergies: 1)  ! * Latex  Orders Added: 1)  Admin 1st Vaccine [90471] 2)  Flu Vaccine 8yrs + [[address]] Flu Vaccine Consent Questions     Do you have a history of severe allergic reactions to this vaccine? no    Any prior history of allergic reactions to egg and/or gelatin? no    Do you have a sensitivity to the preservative Thimersol? no    Do you have a past history of Guillan-Barre Syndrome? no    Do you currently have an acute febrile illness? no    Have you ever had a severe reaction to latex? no    Vaccine information given and explained to patient? yes    Are you currently pregnant? no    Lot Number:[id number]   Exp Date:09/02/2010   Site Given  Left Deltoid IM   Tammy Sidharth Leverette  December 27, 2009 1:14 PM

## 2010-04-04 NOTE — Progress Notes (Signed)
Summary: adderall  Phone Note Call from Patient Call back at Home Phone [phone]   Caller: Patient Call For: nadel Summary of Call: pt wants her 3 rx's for adderall mailed to her home (i have verified her address).  Initial call taken by: Tivis Ringer, CNA,  September 09, 2009 11:54 AM  Follow-up for Phone Call        pt last seen 12-20-2008, no pending appointments. Please advise. Thanks. Zackery Barefoot CMA  September 09, 2009 11:58 AM    Pt aware that RX's are ready and request that they be mailed to her home address. Done.Reynaldo Minium CMA  September 09, 2009 4:22 PM

## 2010-04-06 NOTE — Progress Notes (Signed)
Summary: adderall rx needs signature  Phone Note From Pharmacy   Caller: [facility]* Call For: Dr. Kriste Basque  Summary of Call: Received a fax from [facility] stating that the rx for adderall with directions to not fill before 03-08-10 did not have an MD signature on it so they could not fill it. I called [facility] to confirm this and they state the other 2 rx given on 12-22-09 did have sig but the 3rd rx did not so they need a new rx for this month. New rx printed and placed on SN look-at to sign. [facility] states we could mail the rx directly to them. We will notify pt once this has been done.  Initial call taken by: Carron Curie CMA,  March 22, 2010 10:13 AM  Follow-up for Phone Call        rx for adderall has been signed and mailed back to [facility]----called and spoke with pt and she is aware of this and scheduled appt for 3-9 for cpx and and will need another rx for the adderall for the month of feb and will get her other rx at her ov. Randell Loop CMA  March 22, 2010 2:09 PM   Additional Follow-up for Phone Call Additional follow up Details #1::        rx has been signed by SN and placed in the mail to the pt Randell Loop CMA  March 22, 2010 2:47 PM     New/Updated Medications: ADDERALL XR 20 MG XR24H-CAP (AMPHETAMINE-DEXTROAMPHETAMINE) Take 1 tablet by mouth once a day... **DO NOT FILL BEFORE 04-08-2010** Prescriptions: ADDERALL XR 20 MG XR24H-CAP (AMPHETAMINE-DEXTROAMPHETAMINE) Take 1 tablet by mouth once a day... **DO NOT FILL BEFORE 04-08-2010**  #30 x 0   Entered by:   Randell Loop CMA   Authorized by:   Michele Mcalpine MD   Signed by:   Randell Loop CMA on 03/22/2010   Method used:   Printed then mailed to ...       [facility]* (retail)       [address]       [place], [place]  [address]       Ph: [phone]       Fax: [phone]   RxID:   [id number] ADDERALL XR 20 MG XR24H-CAP (AMPHETAMINE-DEXTROAMPHETAMINE) Take 1 tablet by mouth  once a day... **DO NOT FILL BEFORE 03-08-2010**  #30 x 0   Entered by:   Carron Curie CMA   Authorized by:   Michele Mcalpine MD   Signed by:   Carron Curie CMA on 03/22/2010   Method used:   Printed then mailed to ...       [facility]* (retail)       [address]       [place], [place]  [address]       Ph: [phone]       Fax: [phone]   RxID:   [id number]

## 2010-04-06 NOTE — Progress Notes (Signed)
  Phone Note From Pharmacy Call back at [phone]   Caller: [facility]* Summary of Call: Jasmine December at [facility] called requesting a rx backdated to 12-23-10 for pt's Adderall.  Rx they filled was not signed by Dr Kriste Basque. Initial call taken by: Abigail Miyamoto RN,  March 24, 2010 1:08 PM  Follow-up for Phone Call        Spoke with Huntley Dec at [facility] and instrcuted that we were unable to back date prescriptions.  They will mail original rx back to Dr Kriste Basque to get his signature and then we are to mail it back to [facility]. Abigail Miyamoto RN  March 24, 2010 1:09 PM

## 2010-04-25 ENCOUNTER — Telehealth (INDEPENDENT_AMBULATORY_CARE_PROVIDER_SITE_OTHER): Payer: Self-pay | Admitting: *Deleted

## 2010-05-02 NOTE — Progress Notes (Signed)
Summary: sinus/ cough<<<RX to pharmacy  Phone Note Call from Patient   Caller: Patient Call For: nadel Summary of Call: pt c/o sinus congestion w/ cough/ yellow mucus x 2.5 wks. also pressure/ pain on face. (grandchildren have been staying with her and have all had the same thing for about 5 wks). pt has been taking allergy tabs as well as using saline spray. has also had "low grade" fevers "off and on" for the past few days. requests rx- [facility]. pt cell [phone] Initial call taken by: Tivis Ringer, CNA,  April 25, 2010 1:49 PM  Follow-up for Phone Call        Pt c/o sinus pain and pressure for 2 1/2 weeks.  Cough is prod w/ yellow sputum.  Low grade fever for a few days off and on.  She denies any sob or wheezing. Pls advise.Michel Bickers [facility]  April 25, 2010 3:03 PM  Additional Follow-up for Phone Call Additional follow up Details #1::        per SN---ok for pt to have augmentin 875mg    #14 1 by mouth two times a day and pred dosepak 5mg    6 day pack take as directed with no refills.   Randell Loop CMA  April 25, 2010 4:45 PM   Pt is aware of SN recs and will call if her sxs do not improve or get worse.Michel Bickers CMA  April 25, 2010 4:54 PM    New/Updated Medications: PREDNISONE (PAK) 5 MG TABS (PREDNISONE) Take 6 day pack as directed AUGMENTIN 875-125 MG TABS (AMOXICILLIN-POT CLAVULANATE) 1 by mouth two times a day for 7 days Prescriptions: AUGMENTIN 875-125 MG TABS (AMOXICILLIN-POT CLAVULANATE) 1 by mouth two times a day for 7 days  #14 x 0   Entered by:   Michel Bickers CMA   Authorized by:   Michele Mcalpine MD   Signed by:   Michel Bickers CMA on 04/25/2010   Method used:   Electronically to        [facility]* (retail)       [address]       [place], [place]  [address]       Ph: [phone]       Fax: [phone]   RxID:   [id number] PREDNISONE (PAK) 5 MG TABS (PREDNISONE) Take 6 day pack as directed  #1 x 0   Entered by:   Michel Bickers CMA   Authorized by:   Michele Mcalpine MD   Signed by:   Michel Bickers CMA on 04/25/2010   Method used:   Electronically to        [facility]* (retail)       [address]       [place], [place]  [id number]       Ph: [phone]       Fax: [phone]   RxID:   [id number]

## 2010-05-08 ENCOUNTER — Telehealth (INDEPENDENT_AMBULATORY_CARE_PROVIDER_SITE_OTHER): Payer: Self-pay | Admitting: *Deleted

## 2010-05-10 ENCOUNTER — Other Ambulatory Visit: Payer: BC Managed Care – PPO

## 2010-05-10 ENCOUNTER — Other Ambulatory Visit: Payer: Self-pay | Admitting: Pulmonary Disease

## 2010-05-10 ENCOUNTER — Encounter (INDEPENDENT_AMBULATORY_CARE_PROVIDER_SITE_OTHER): Payer: Self-pay | Admitting: *Deleted

## 2010-05-10 ENCOUNTER — Telehealth: Payer: Self-pay | Admitting: Pulmonary Disease

## 2010-05-10 DIAGNOSIS — Z0000 Encounter for general adult medical examination without abnormal findings: Secondary | ICD-10-CM

## 2010-05-10 DIAGNOSIS — E785 Hyperlipidemia, unspecified: Secondary | ICD-10-CM

## 2010-05-10 LAB — CBC WITH DIFFERENTIAL/PLATELET
Basophils Absolute: 0 10*3/uL (ref 0.0–0.1)
Eosinophils Relative: 2.8 % (ref 0.0–5.0)
HCT: 45.1 % (ref 36.0–46.0)
Lymphocytes Relative: 28.3 % (ref 12.0–46.0)
Monocytes Relative: 7.1 % (ref 3.0–12.0)
Neutrophils Relative %: 61.4 % (ref 43.0–77.0)
Platelets: 261 10*3/uL (ref 150.0–400.0)
WBC: 9.6 10*3/uL (ref 4.5–10.5)

## 2010-05-10 LAB — BASIC METABOLIC PANEL
Calcium: 8.8 mg/dL (ref 8.4–10.5)
Chloride: 107 mEq/L (ref 96–112)
Creatinine, Ser: 0.8 mg/dL (ref 0.4–1.2)
Sodium: 141 mEq/L (ref 135–145)

## 2010-05-10 LAB — LIPID PANEL
Cholesterol: 207 mg/dL — ABNORMAL HIGH (ref 0–200)
Total CHOL/HDL Ratio: 4
VLDL: 40.8 mg/dL — ABNORMAL HIGH (ref 0.0–40.0)

## 2010-05-10 LAB — HEPATIC FUNCTION PANEL
AST: 15 U/L (ref 0–37)
Albumin: 3.5 g/dL (ref 3.5–5.2)
Total Protein: 6 g/dL (ref 6.0–8.3)

## 2010-05-11 ENCOUNTER — Encounter (INDEPENDENT_AMBULATORY_CARE_PROVIDER_SITE_OTHER): Payer: BC Managed Care – PPO | Admitting: Pulmonary Disease

## 2010-05-11 ENCOUNTER — Encounter: Payer: Self-pay | Admitting: Pulmonary Disease

## 2010-05-11 ENCOUNTER — Other Ambulatory Visit: Payer: Self-pay | Admitting: Pulmonary Disease

## 2010-05-11 ENCOUNTER — Ambulatory Visit (INDEPENDENT_AMBULATORY_CARE_PROVIDER_SITE_OTHER)
Admission: RE | Admit: 2010-05-11 | Discharge: 2010-05-11 | Disposition: A | Discharge status: Home / Self Care | Payer: BC Managed Care – PPO | Source: Ambulatory Visit | Attending: Pulmonary Disease | Admitting: Pulmonary Disease

## 2010-05-11 DIAGNOSIS — Z0000 Encounter for general adult medical examination without abnormal findings: Secondary | ICD-10-CM

## 2010-05-12 ENCOUNTER — Encounter: Payer: Self-pay | Admitting: Pulmonary Disease

## 2010-05-16 NOTE — Progress Notes (Signed)
Summary: lab prior to or after CPX  Phone Note Call from Patient   Caller: Patient Call For: nadel Summary of Call: Patient phoned and stated that she was told that she could call to have her lab work entered into the system prior to her appointment but she isnt sure what labs she needs or if she needs to wait until after the appt in case he wants her to have something out of the norm  added. 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 She can be reached at [phone] Initial call taken by: Vedia Coffer,  May 08, 2010 4:21 PM  Follow-up for Phone Call        Pt has ov sched with SN for 3/9 at 10:30 am.  Do you want her to have labs done prior to appt, or can this wait until she is seen? Pls advise thanks Vernie Murders  May 08, 2010 5:36 PM'  Additional Follow-up for Phone Call Additional follow up Details #1::        per SN---ok for pt to have labs prior to appt---lip-bmp-hepat-cbcd-tsh--v70.0.  thanks Randell Loop CMA  May 09, 2010 9:42 AM     Additional Follow-up for Phone Call Additional follow up Details #2::    Spoke with pt .  Order placed in Epic to have labs drawn on 05-10-10. Abigail Miyamoto RN  May 09, 2010 10:18 AM

## 2010-05-16 NOTE — Progress Notes (Signed)
Summary: pt cant take todays appt needs to keep fridays  Phone Note Call from Patient   Caller: Patient Call For: Sherrin Stahle Summary of Call: Patient phoned stated that she just spoke to [facility] and was calling her back she can be reached at [phone] she will be there for about the next 45 minutes. 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  Initial call taken by: Vedia Coffer,  May 10, 2010 9:55 AM  Follow-up for Phone Call        called and spoke with pt and she is aware of appt rescheduled for 9am Randell Loop CMA  May 10, 2010 10:13 AM

## 2010-05-23 NOTE — Assessment & Plan Note (Signed)
Summary: cpx/la   CC:  17 month ROV & CPX....  History of Present Illness: 60 y/o WF here for a follow up visit...    ~  Oct10:  she has had a good year overall and remains on ADDERALL XR 20mg /d which we refill for her regularly, BMD 8/10 was normal, still smokes occas & CXR is clear, FLP on Simva40 was good in 2009 but not as good this yr & we discussed diet Rx (see below)... she notes some prob w/ staying asleep & this may be an issue w/ sleep hygiene- we discussed this & poss strategies to help, she will call if meds needed.   ~  May 11, 2010:  Tameyah remains stable on meds as below, but still smoking  ~1/2ppd & we discussed smoking cessation strategies, offered Chantix, etc;  CXR remains clear w/ min apical pleural thickening noted;  EKG w/ NSR, WNL;  Labs showed FLP not at goal w/ LDL 139 on the Simmva40 & we discussed changing to Lipitor 40mg /d to see if this works better for her (plus low chol/ low fat diet etc);  she sees DrGottsegen for GYN on Estradiol Rx;  she continues on the Adderall therapy which she feels she still benefits from & she requests to continue per our protocol...    Current Problem List:  ALLERGIC RHINITIS (ICD-477.9) - she uses Zyrtek & saline as needed...  ACUTE BRONCHITIS (ICD-466.0) - hx recurrent bronchitis... discussed smoking cessation...  ~  CXR 9/10 showed clear, NAD.Marland Kitchen.  ~  CXR 10/10 showed clear, WNL...  HYPERCHOLESTEROLEMIA (ICD-272.0) - changed to SIMVASTATIN 40mg /d in 2008... tolerates well...  ~  FLP 7/08 on Lip20 showed TChol 208, TG 90, HDL 64, LDL 122... changed to Simva40...  ~  FLP 8/09 showed TChol 173, TG 107, HDL 57, LDL 95... rec> same...  ~  FLP 9/10 showed TChol 220, TG 179, HDL 58, LDL 138... rec> better diiet, same med for now.  ~  FLP 11/10 showed TChol 208, TG 241, HDL 58, LDL 120... reviewed low fat diet recs...  COLONIC POLYPS (ICD-211.3) - colonoscopy 10/06 by Dorris Singh showed 3mm polyp= hyperplastic... follow up planned  3yrs...  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 she is active, exerc regularly, no on-going ortho complaints...  ~  3/12:  she notes left thumb tenosynovitis w/ injection & splint rx per DrSypher.  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 she wishes to continue this med and Rx written...  SHINGLES, HX OF (ICD-V13.8) > we discussed the Shingles vaccine & wrote Rx 3/12...  DERMATITIS (ICD-692.9) - followed by DrTafeen...  Health Maintenance - GYN= DrGottsegen w/ Hyst in 1999... she had a BMD here 8/08 w/ normal TScores... repeat BMD here 8/10 showed TScores +0.2 in Spine, and -0.6 in right FemNeck... Vit D level here 10/10 = 40... continue Calcium, Vits, Vit D 1000/d...   Preventive Screening-Counseling & Management  Alcohol-Tobacco     Smoking Status: current     Packs/Day: 0.5  Allergies: 1)  ! * Latex  Comments:  Nurse/Medical Assistant: The patient's medications and allergies were reviewed with the patient and were updated in the Medication and Allergy Lists.  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 Father alive  ~44 w/ ankylosing spondylitis Mother alive age 59 w/ hx breast cancer  Social History: Reviewed history and no changes required. Smoking Status:  current Packs/Day:  0.5  Review of Systems       The patient complains of joint pain and stiffness.  The patient denies fever, chills, sweats,  anorexia, fatigue, weakness, malaise, weight loss, sleep disorder, blurring, diplopia, eye irritation, eye discharge, vision loss, eye pain, photophobia, earache, ear discharge, tinnitus, decreased hearing, nasal congestion, nosebleeds, sore throat, hoarseness, chest pain, palpitations, syncope, dyspnea on exertion, orthopnea, PND, peripheral edema, cough, dyspnea at rest, excessive sputum, hemoptysis, wheezing, pleurisy, nausea, vomiting, diarrhea, constipation, change in bowel habits, abdominal pain, melena, hematochezia, jaundice, gas/bloating, indigestion/heartburn, dysphagia, odynophagia, dysuria, hematuria, urinary frequency, urinary hesitancy, nocturia, incontinence, back pain, joint swelling, muscle cramps, muscle weakness, arthritis, sciatica, restless legs, leg pain at night, leg pain with exertion, rash, itching, dryness, suspicious lesions, paralysis, paresthesias, seizures, tremors, vertigo, transient blindness, frequent falls, frequent headaches, difficulty walking, depression, anxiety, memory loss, confusion, cold intolerance, heat intolerance, polydipsia, polyphagia, polyuria, unusual weight change, abnormal bruising, bleeding, enlarged lymph nodes, urticaria, allergic rash, hay fever, and recurrent infections.    Vital Signs:  Patient profile:   59 year old female Height:      65.5 inches Weight:      124.25 pounds BMI:     20.44 O2 Sat:      100 % on Room air Temp:     98.6 degrees F oral Pulse rate:   77 / minute BP sitting:   118 / 64  (left arm) Cuff size:   regular  Vitals Entered By: Randell Loop CMA (May 11, 2010 9:01 AM)  O2 Sat at Rest %:  100 O2 Flow:  Room air CC: 17 month ROV & CPX... Is Patient Diabetic? No Pain Assessment Patient in pain? no      Comments meds updated today with pt   Physical Exam  Additional Exam:  WD, WN, 60 y/o WF in NAD... GENERAL:  Alert & oriented; pleasant & cooperative... HEENT:  [place]/AT, EOM-wnl, PERRLA, EACs-clear, TMs-wnl,  NOSE-clear, THROAT-clear & wnl. NECK:  Supple w/ fairROM; no JVD; normal carotid impulses w/o bruits; no thyromegaly or nodules palpated; no lymphadenopathy. CHEST:  Clear to P & A; without wheezes/ rales/ or rhonchi. HEART:  Regular Rhythm; without murmurs/ rubs/ or gallops. ABDOMEN:  Soft & nontender; normal bowel sounds; no organomegaly or masses detected. EXT: without deformities, mild arthritic changes; no varicose veins/ venous insuffic/ or edema. NEURO:  CN's intact; motor testing normal; sensory testing normal; gait normal & balance OK. DERM:  No lesions noted; no rash etc...    Impression & Recommendations:  Problem # 1:  PHYSICAL EXAMINATION (ICD-V70.0)  Orders: 12 Lead EKG (12 Lead EKG) T-2 View CXR (71020TC) Labs done 05/10/10 & reviewed w/ pt...  Problem # 2:  ACUTE BRONCHITIS (ICD-466.0) No recent resp exac, but she needs to quit all the smoking & we discussed smoking cessation strategies...  Problem # 3:  HYPERCHOLESTEROLEMIA (ICD-272.0) With her TG still >200 we discussed better low fat diet, and w/ her LDL at 139 on the Simva40 we decided to ch to [id number]... Her updated medication list for this problem includes:    Lipitor 40 Mg Tabs (Atorvastatin calcium) .Marland Kitchen... Take 1 tab by mouth once daily.Marland KitchenMarland Kitchen  Problem # 4:  COLONIC POLYPS (ICD-211.3) She had hyperpl polyp in 2006> due for colon from [facility] 10/16...  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 doing satis, very active, just OTC analgesics Prn...  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  Complete Medication List: 1)  Zyrtec Allergy 10 Mg Tabs (Cetirizine hcl) .... Take one tablet by mouth at bedtime 2)  Aspirin 81 Mg Tbec (Aspirin) .... Take 1 tablet by mouth once a day 3)  Lipitor 40 Mg Tabs (Atorvastatin calcium) .... Take 1 tab by mouth once daily.Marland KitchenMarland Kitchen 4)  Estrace 1  Mg Tabs (Estradiol) .... Take 1 tablet by mouth once a day 5)  Adderall Xr 20 Mg Xr24h-cap (Amphetamine-dextroamphetamine) .... Take 1 tablet by mouth once a day... ** do not fill before 07/11/10 ** 6)  Centrum Silver Tabs (Multiple vitamins-minerals) .... Take 1 tablet by mouth once a day 7)  Protegra Cardio Tabs (Multiple vitamins-minerals) .... Take 1 tablet by mouth once a day 8)  Ultravate 0.05 % Crea (Halobetasol propionate) .... As needed 9)  Shingles Vaccine  .... Admin one shingles shot...  Patient Instructions: 1)  Today we updated your med list- see below.... 2)  We decided to change your simvastatin to LIPITOR 40mg  to see if we can improve your Lipid Profile further (let's plan a follow up FLP after you have been on it for 2-3 months- call when ready).Marland KitchenMarland Kitchen 3)  We also refilled your Adderall Rx... 4)  Today we did your follow up CXR & EKG... please call the "phone tree" in a few days for your results.Marland KitchenMarland Kitchen 5)  Call for any problems... Prescriptions: SHINGLES VACCINE Admin one shingles shot...  #1 x 0   Entered and Authorized by:   Michele Mcalpine MD   Signed by:   Michele Mcalpine MD on 05/11/2010   Method used:   Print then Give to Patient   RxID:   [id number] ADDERALL XR 20 MG XR24H-CAP (AMPHETAMINE-DEXTROAMPHETAMINE) Take 1 tablet by mouth once a day... ** DO NOT FILL BEFORE 07/11/10 **  #30 x 0   Entered and Authorized by:   Michele Mcalpine MD   Signed by:   Michele Mcalpine MD on 05/11/2010   Method used:   Print then Give to Patient   RxID:   [id number] ADDERALL XR 20 MG XR24H-CAP (AMPHETAMINE-DEXTROAMPHETAMINE) Take 1 tablet by mouth once a day... ** DO NOT FILL BEFORE 06/11/10 **  #30 x 0   Entered and Authorized by:   Michele Mcalpine MD   Signed by:   Michele Mcalpine MD on 05/11/2010   Method used:   Print then Give to Patient   RxID:   [id number] ADDERALL XR 20 MG XR24H-CAP (AMPHETAMINE-DEXTROAMPHETAMINE) Take 1 tablet by mouth once a day...  #30 x 0   Entered and  Authorized by:   Michele Mcalpine MD   Signed by:   Michele Mcalpine MD on 05/11/2010   Method used:   Print then Give to Patient   RxID:   [id number] LIPITOR 40 MG TABS (ATORVASTATIN CALCIUM) take 1 tab by mouth once daily...  #90 x 4   Entered and Authorized by:   Michele Mcalpine MD   Signed by:   Michele Mcalpine MD on 05/11/2010   Method used:   Print then Give to Patient  RxID:   [id number]

## 2010-07-21 NOTE — Letter (Signed)
October 25, 2006    Desiree Salinas. Desiree Manly, MD  [facility]  [address] [place], [address]  [place], [place] [address]   RE:  Desiree Salinas, Desiree Salinas  MRN:  [id number]  /  DOB:  1950-08-17   Dear Desiree Salinas:   Desiree Salinas is a 60 year old white female whom I follow for general  medical purposes.  She has a history of HLAB-27 positive mild ankylosing  spondylitis (positive family history), and adult ADD for which she takes  Adderall.  She is a smoker with mild bronchitic symptoms in the past.  She recently had some low back pain and a scan that showed bulging  disks.  She has requested that I call you for a neurology appointment  for further evaluation of these symptoms.   I am including copies of all the available data from the [facility] E-  chart, and as always I appreciate your evaluation.   Sincerely,    Sincerely,      Desiree Cloud. Kriste Basque, MD  Electronically Signed    SMN/MedQ  DD: 10/25/2006  DT: 10/26/2006  Job #: [id number]

## 2010-08-18 ENCOUNTER — Telehealth: Payer: Self-pay | Admitting: Pulmonary Disease

## 2010-08-18 NOTE — Telephone Encounter (Signed)
lmomtcb- need to inform her this will need to be addressed when SN comes back to the office 08/21/10.

## 2010-08-18 NOTE — Telephone Encounter (Signed)
PT CALLED BACK- I ADVISED HER RE: MSG. SHE WILL WAIT FOR NURSE TO CALL BACK Monday. Hazel Sams

## 2010-08-21 MED ORDER — AMPHETAMINE-DEXTROAMPHET ER 20 MG PO CP24: ORAL_CAPSULE | ORAL | Status: DC

## 2010-08-21 NOTE — Telephone Encounter (Signed)
Called and spoke with pt and she is aware that rx are ready to be picked up.  Left rx up front for pt

## 2010-08-21 NOTE — Telephone Encounter (Signed)
Per SN last OV note okay to refill per protocol. Rx's placed on SN cart to sign. Please advise pt when ready for pick up. Thanks

## 2010-11-07 ENCOUNTER — Telehealth: Payer: Self-pay | Admitting: Pulmonary Disease

## 2010-11-07 MED ORDER — AMPHETAMINE-DEXTROAMPHET ER 20 MG PO CP24: ORAL_CAPSULE | ORAL | Status: DC

## 2010-11-07 NOTE — Telephone Encounter (Signed)
rx have been printed out for SN to sign and will mail to pts home.

## 2010-11-08 NOTE — Telephone Encounter (Signed)
rx have been printed and placed in the mail to the pt per her request.

## 2010-12-11 ENCOUNTER — Encounter: Payer: Self-pay | Admitting: Gynecology

## 2010-12-11 DIAGNOSIS — N890 Mild vaginal dysplasia: Secondary | ICD-10-CM | POA: Insufficient documentation

## 2010-12-18 ENCOUNTER — Other Ambulatory Visit (HOSPITAL_COMMUNITY)
Admission: RE | Admit: 2010-12-18 | Discharge: 2010-12-18 | Disposition: A | Discharge status: Home / Self Care | Payer: BC Managed Care – PPO | Source: Ambulatory Visit | Attending: Obstetrics and Gynecology | Admitting: Obstetrics and Gynecology

## 2010-12-18 ENCOUNTER — Ambulatory Visit (INDEPENDENT_AMBULATORY_CARE_PROVIDER_SITE_OTHER): Payer: BC Managed Care – PPO | Admitting: Obstetrics and Gynecology

## 2010-12-18 ENCOUNTER — Encounter: Payer: Self-pay | Admitting: Obstetrics and Gynecology

## 2010-12-18 VITALS — BP 120/78 | Ht 64.5 in | Wt 125.0 lb

## 2010-12-18 DIAGNOSIS — Z01419 Encounter for gynecological examination (general) (routine) without abnormal findings: Secondary | ICD-10-CM | POA: Insufficient documentation

## 2010-12-18 DIAGNOSIS — Z780 Asymptomatic menopausal state: Secondary | ICD-10-CM

## 2010-12-18 DIAGNOSIS — N951 Menopausal and female climacteric states: Secondary | ICD-10-CM

## 2010-12-18 DIAGNOSIS — Z23 Encounter for immunization: Secondary | ICD-10-CM

## 2010-12-18 MED ORDER — ESTRADIOL 1 MG PO TABS: 1.0000 mg | ORAL_TABLET | Freq: Every day | ORAL | Status: DC

## 2010-12-18 NOTE — Progress Notes (Signed)
Patient came to see me today for her annual GYN exam. She remains on estradiol with excellent results. She does her bone densities in lab through Dr. Reggie Pile office. She is due for her yearly mammogram. She's having no GYN symptoms.  ROS: 12 systems reviewed done. Patient has hyperlipidemia. Patient is ADD adult onset.patient has a history of colon polyps. Patient has ankylosing spondylitis.  HEENT: Within normal limits. Kennon Portela present Neck: No masses. Supraclavicular lymph nodes: Not enlarged. Breasts: Examined in both sitting and lying position. Symmetrical without skin changes or masses. Abdomen: Soft no masses guarding or rebound. No hernias. Pelvic: External within normal limits. BUS within normal limits. Vaginal examination shows good estrogen effect, no cystocele enterocele or rectocele. Cervix and uterus absent. Adnexa within normal limits. Rectovaginal confirmatory. Extremities within normal limits.  Assessment: Vasomotor symptoms  Plan: Discussed non-oral estrogen for DVT protection. Patient declined. Continue estradiol 1 mg daily. Mammogram.

## 2010-12-20 ENCOUNTER — Other Ambulatory Visit: Payer: Self-pay | Admitting: *Deleted

## 2010-12-20 DIAGNOSIS — Z780 Asymptomatic menopausal state: Secondary | ICD-10-CM

## 2010-12-20 MED ORDER — ESTRADIOL 1 MG PO TABS: 1.0000 mg | ORAL_TABLET | Freq: Every day | ORAL | Status: DC

## 2010-12-20 NOTE — Telephone Encounter (Signed)
Received fax from [facility] stating she doesn't have home delivery benefits yet. So therefore will send the Rx to another pharmacy.

## 2010-12-21 MED ORDER — ESTRADIOL 1 MG PO TABS: 1.0000 mg | ORAL_TABLET | Freq: Every day | ORAL | Status: DC

## 2010-12-21 NOTE — Telephone Encounter (Signed)
Addended by: Richardson Chiquito on: 12/21/2010 09:50 AM   Modules accepted: Orders

## 2010-12-21 NOTE — Telephone Encounter (Signed)
Pt called back after sending Rx to [facility] and stated they changed Mail order services. Rx sent to another Pharmacy today [facility].

## 2011-01-11 ENCOUNTER — Encounter: Payer: Self-pay | Admitting: Obstetrics and Gynecology

## 2011-01-31 ENCOUNTER — Telehealth: Payer: Self-pay | Admitting: Pulmonary Disease

## 2011-01-31 MED ORDER — AMPHETAMINE-DEXTROAMPHET ER 20 MG PO CP24: ORAL_CAPSULE | ORAL | Status: DC

## 2011-01-31 NOTE — Telephone Encounter (Signed)
rx have been printed and placed on SN cart to be signed.  Will place these in the mail per pts request once they have been signed.

## 2011-05-16 ENCOUNTER — Telehealth: Payer: Self-pay | Admitting: Pulmonary Disease

## 2011-05-16 MED ORDER — AMPHETAMINE-DEXTROAMPHET ER 20 MG PO CP24: ORAL_CAPSULE | ORAL | Status: DC

## 2011-05-16 NOTE — Telephone Encounter (Signed)
I spoke with pt and is requesting a refill on her adderrall for 2 months time. I have printed off rx's and pt would like to pick up these rx's when ready. These has been placed on SN cart for signature

## 2011-05-17 NOTE — Telephone Encounter (Signed)
Called and spoke with pt and she is aware of rx left up front for her to pick up.

## 2011-05-21 ENCOUNTER — Telehealth: Payer: Self-pay | Admitting: *Deleted

## 2011-05-21 MED ORDER — AMPHETAMINE-DEXTROAMPHET ER 20 MG PO CP24: ORAL_CAPSULE | ORAL | Status: DC

## 2011-05-21 NOTE — Telephone Encounter (Signed)
Pt called and stated that she never did pick up the last rx that was printed since this was dated wrong.  Pt stated that she is almost out of her adderall and needs the 3 rx printed so she can come by tomorrow and pick these up.  She is needed rx for 3-15, 4-15 and 5-15.  Pt is aware that these have been printed and we will leave them up front for her to pick up in the morning.

## 2011-06-13 ENCOUNTER — Telehealth: Payer: Self-pay | Admitting: Pulmonary Disease

## 2011-06-13 MED ORDER — ATORVASTATIN CALCIUM 40 MG PO TABS: 40.0000 mg | ORAL_TABLET | Freq: Every day | ORAL | Status: DC

## 2011-06-13 NOTE — Telephone Encounter (Signed)
Rx refilled x 1 to last her until May rov. Spoke with pt and made aware that this was done and advised to keep rov for additional rx. Pt verbalized understanding and states nothing further needed.

## 2011-07-16 ENCOUNTER — Ambulatory Visit: Payer: BC Managed Care – PPO | Admitting: Pulmonary Disease

## 2011-08-08 ENCOUNTER — Ambulatory Visit (INDEPENDENT_AMBULATORY_CARE_PROVIDER_SITE_OTHER)
Admission: RE | Admit: 2011-08-08 | Discharge: 2011-08-08 | Disposition: A | Discharge status: Home / Self Care | Payer: BC Managed Care – PPO | Source: Ambulatory Visit | Attending: Pulmonary Disease | Admitting: Pulmonary Disease

## 2011-08-08 ENCOUNTER — Encounter: Payer: Self-pay | Admitting: Pulmonary Disease

## 2011-08-08 ENCOUNTER — Ambulatory Visit (INDEPENDENT_AMBULATORY_CARE_PROVIDER_SITE_OTHER): Payer: BC Managed Care – PPO | Admitting: Pulmonary Disease

## 2011-08-08 VITALS — BP 116/68 | HR 88 | Temp 97.0°F | Ht 65.5 in | Wt 125.6 lb

## 2011-08-08 DIAGNOSIS — F17210 Nicotine dependence, cigarettes, uncomplicated: Secondary | ICD-10-CM | POA: Insufficient documentation

## 2011-08-08 DIAGNOSIS — Z780 Asymptomatic menopausal state: Secondary | ICD-10-CM

## 2011-08-08 DIAGNOSIS — Z0000 Encounter for general adult medical examination without abnormal findings: Secondary | ICD-10-CM

## 2011-08-08 DIAGNOSIS — D126 Benign neoplasm of colon, unspecified: Secondary | ICD-10-CM

## 2011-08-08 DIAGNOSIS — F17200 Nicotine dependence, unspecified, uncomplicated: Secondary | ICD-10-CM

## 2011-08-08 DIAGNOSIS — Z23 Encounter for immunization: Secondary | ICD-10-CM

## 2011-08-08 DIAGNOSIS — E7800 Pure hypercholesterolemia, unspecified: Secondary | ICD-10-CM

## 2011-08-08 MED ORDER — ESTRADIOL 1 MG PO TABS: 1.0000 mg | ORAL_TABLET | Freq: Every day | ORAL | Status: DC

## 2011-08-08 MED ORDER — AMPHETAMINE-DEXTROAMPHET ER 20 MG PO CP24: ORAL_CAPSULE | ORAL | Status: DC

## 2011-08-08 MED ORDER — TETANUS-DIPHTH-ACELL PERTUSSIS 5-2.5-18.5 LF-MCG/0.5 IM SUSP: 0.5000 mL | Freq: Once | INTRAMUSCULAR | Status: DC

## 2011-08-08 MED ORDER — ATORVASTATIN CALCIUM 40 MG PO TABS: 40.0000 mg | ORAL_TABLET | Freq: Every day | ORAL | Status: DC

## 2011-08-08 NOTE — Patient Instructions (Addendum)
Today we updated your med list in our EPIC system...    Continue your current medications the same...    We refilled your meds per request...  Today we did your follow up CXR... Please return to our lab one morning at your convenience for FASTING blood work...    We will call you w/ the results when avail...  Today we gave you the combination Tetanus bvaccine called the TDAP (it should be good for 10 yrs)...  Call for any questions...  Congrats on the birth of your grandson- the big O

## 2011-08-09 ENCOUNTER — Encounter: Payer: Self-pay | Admitting: Pulmonary Disease

## 2011-08-09 NOTE — Progress Notes (Signed)
Subjective:     Patient ID: Desiree Salinas, female   DOB: 1950-12-02, 61 y.o.   MRN: [id number]  HPI 61 y/o WF here for a follow up visit...   ~  Oct10:  she has had a good year overall and remains on ADDERALL XR 20mg /d which we refill for her regularly, BMD 8/10 was normal, still smokes occas & CXR is clear, FLP on Simva40 was good in 2009 but not as good this yr & we discussed diet Rx (see below)... she notes some prob w/ staying asleep & this may be an issue w/ sleep hygiene- we discussed this & poss strategies to help, she will call if meds needed.  ~  May 11, 2010:  [name] remains stable on meds as below, but still smoking ~1/2ppd & we discussed smoking cessation strategies, offered Chantix, etc;  CXR remains clear w/ min apical pleural thickening noted;  EKG w/ NSR, WNL;  Labs showed FLP not at goal w/ LDL 139 on the Simva40 & we discussed changing to Lipitor 40mg /d to see if this works better for her (plus low chol/ low fat diet etc);  she sees DrGottsegen for GYN on Estradiol Rx;  she continues on the Adderall therapy which she feels she still benefits from & she requests to continue per our protocol...  ~  August 08, 2011:  37mo ROV & CPX> [name] has had a good year- Daugh had 2nd child, Desiree Salinas, & lives in [place]; medically she notes some crepitus in knees but still plays tennis & dances w/o pain etc; she's noted some insomnia- able to fall asleep OK but wakes up & unable to fall back to sleep; as noted she is still taking the Adderall for ADD & we discussed it's effect on sleep, don't take too late in the day etc; I'm reluctant to add sleeping meds or Benzo on top of this & she will try OTC meds like TylenolPM, Melatonin, etc...  We reviewed prob list, meds, xrays and labs> see below>>  OK TDAP today... CXR 6/13 showed normal heart size, clear lungs, NAD... LABS 6/13:  FLP- at goals on Lip40;  Chems- wnl;  CBC- wnl;  TSH=1.88;  VitD=17;  UA- ok...   Problem List:  ALLERGIC RHINITIS  (ICD-477.9) - she uses Zyrtek & saline as needed...  ACUTE BRONCHITIS (ICD-466.0) - hx recurrent bronchitis in the past... CIGARETTE SMOKER >> prev ~1/2ppd but she hints that maybe it's less now; we discussed smoking cessation options but she is not interested. ~  CXR 9/10 showed clear, NAD.Marland Kitchen. ~  CXR 10/10 showed clear, WNL.Marland Kitchen. ~  CXR 3/12 remains clear w/ min apical pleural thickening noted. ~  CXR 6/13 showed normal heart size, clear lungs, NAD...  HYPERCHOLESTEROLEMIA (ICD-272.0) - on Simva40 since 2008, she was changed to ATORVASTATIN 40mg /d in 2012> ~  FLP 7/08 on Lip20 showed TChol 208, TG 90, HDL 64, LDL 122... changed to Simva40... ~  FLP 8/09 on Simva40 showed TChol 173, TG 107, HDL 57, LDL 95... rec> same... ~  FLP 9/10 on Simva40 showed TChol 220, TG 179, HDL 58, LDL 138... rec> better diiet, same med for now. ~  FLP 4/11 on Simva40 showed TChol 201, TG 80, HDL 76, LDL 114... Continue same. ~  FLP 3/12 on Simva40 showed TChol 207, TG 204, HDL 57, LDL 139... Rec> ch to [id number]. ~  FLP 6/13 on Lip40 showed TChol TChol 146, TG 103, HDL 73, LDL 52... Continue same.  COLONIC POLYPS (ICD-211.3) - colonoscopy  10/06 by Dorris Singh showed 3mm polyp= hyperplastic... follow up planned 41yrs...  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 she is active, exerc regularly, no on-going ortho complaints... ~  3/12:  she notes left thumb tenosynovitis w/ injection & splint rx per DrSypher; she indicates that she requires periodic injections for this problem. ~  6/13:  she notes some crepitus in knees but no pain 7 continues to play tennis & dance regularly...  VITAMIN D DEFICIENCY >> rec to take ~2000u Vit D supplement OTC daily... ~  Labs 10/10 showed Vit D level = 40... Continue OTC supplement ~1000u daily... ~  Labs 6/13 showed Vit D level = 17... rec to incr Vit D supplement to ~2000u daily...  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  ~  6/13:  she wishes to continue this med and Rx written per our protocol.  SHINGLES, HX OF (ICD-V13.8) > she had a mild outbreak in 2011 in left V1 distrib & was treated by DrTaffeen; we discussed the Shingles vaccine & wrote Rx 3/12==> [facility]  DERMATITIS (ICD-692.9) - followed by DrTafeen...  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 she had a BMD here 8/08 w/ normal TScores... repeat BMD here 8/10 showed TScores +0.2 in Spine, and -0.6 in right FemNeck... Vit D level here 6/13 = 17, rec supplement orally... continue Calcium, Vits, Vit D 2000/d...   Past Surgical History  Procedure Date  . Abdominal surgery 1999    Abdominoplasty  . Abdominal hysterectomy 1999    TAH  . Tonsillectomy and adenoidectomy   . Augmentation mammaplasty     Implants  . Tubal ligation     Outpatient Encounter Prescriptions as of 08/08/2011  Medication Sig Dispense Refill  . amphetamine-dextroamphetamine (ADDERALL XR, 20MG ,) 20 MG 24 hr capsule Take 1 tablet by mouth once a day...  DO NOT FILL BEFORE 10/18/2011  30 capsule  0  . aspirin 81 MG tablet Take 81 mg by mouth daily.        Marland Kitchen atorvastatin (LIPITOR) 40 MG tablet Take 1 tablet (40 mg total) by mouth daily.  90 tablet  3  . Calcium Carbonate-Vitamin D (CALCIUM + D PO) Take by mouth daily.        . Cetirizine HCl (ZYRTEC PO) Take by mouth.        . estradiol (ESTRACE) 1 MG tablet Take 1 tablet (1 mg total) by mouth daily.  90 tablet  4  . Multiple Vitamins-Minerals (PROTEGRA CARDIO PO) Take by mouth.         Facility-Administered Encounter Medications as of 08/08/2011  Medication Dose Route Frequency Provider Last Rate Last Dose  . TDaP (BOOSTRIX) injection 0.5 mL  0.5 mL Intramuscular Once Michele Mcalpine, MD         Allergies  Allergen Reactions  . Latex   . Nickel     Current Medications, Allergies, Past Medical History, Past Surgical History, Family History, and Social History were reviewed in [employer] record.   Review of Systems        The patient complains of joint pain and stiffness.  The patient denies fever, chills, sweats, anorexia, fatigue, weakness, malaise, weight loss, sleep disorder, blurring, diplopia, eye irritation, eye discharge,  vision loss, eye pain, photophobia, earache, ear discharge, tinnitus, decreased hearing, nasal congestion, nosebleeds, sore throat, hoarseness, chest pain, palpitations, syncope, dyspnea on exertion, orthopnea, PND, peripheral edema, cough, dyspnea at rest, excessive sputum, hemoptysis, wheezing, pleurisy, nausea, vomiting, diarrhea, constipation, change in bowel habits, abdominal pain, melena, hematochezia, jaundice, gas/bloating, indigestion/heartburn, dysphagia, odynophagia, dysuria, hematuria, urinary frequency, urinary hesitancy, nocturia, incontinence, back pain, joint swelling, muscle cramps, muscle weakness, arthritis, sciatica, restless legs, leg pain at night, leg pain with exertion, rash, itching, dryness, suspicious lesions, paralysis, paresthesias, seizures, tremors, vertigo, transient blindness, frequent falls, frequent headaches, difficulty walking, depression, anxiety, memory loss, confusion, cold intolerance, heat intolerance, polydipsia, polyphagia, polyuria, unusual weight change, abnormal bruising, bleeding, enlarged lymph nodes, urticaria, allergic rash, hay fever, and recurrent infections.     Objective:   Physical Exam     WD, WN, 61 y/o WF in NAD... GENERAL:  Alert & oriented; pleasant & cooperative... HEENT:  [place]/AT, EOM-wnl, PERRLA, EACs-clear, TMs-wnl, NOSE-clear, THROAT-clear & wnl. NECK:  Supple w/ fairROM; no JVD; normal carotid impulses w/o bruits; no thyromegaly or nodules palpated; no  lymphadenopathy. CHEST:  Clear to P & A; without wheezes/ rales/ or rhonchi. HEART:  Regular Rhythm; without murmurs/ rubs/ or gallops. ABDOMEN:  Soft & nontender; normal bowel sounds; no organomegaly or masses detected. EXT: without deformities, mild arthritic changes; no varicose veins/ venous insuffic/ or edema. NEURO:  CN's intact; motor testing normal; sensory testing normal; gait normal & balance OK. DERM:  No lesions noted; no rash etc...  RADIOLOGY DATA:  Reviewed in the EPIC EMR & discussed w/ the patient...  LABORATORY DATA:  Reviewed in the EPIC EMR & discussed w/ the patient...   Assessment:     CPX>>  We gave her the TDAP today (6/13)  AR/ Smoker/ Hx Bronchits>  We discussed smoking cessation options but she is not interested in med rx; f/u CXR remains clear & she is asymptomatic x min cough; remains active w/ tennis & dance...  CHOL>  Controlled on low chol/ low fat diet + ATORVA40 & FLP is improved...  Hx Colon Polyps>  She had 3mm polyp removed 2006 by DrKaplan & it was hyperplastic w/ f/u suggested in 65yrs; clinically stable, no problems, no blood seen, etc...  AS/ HLA-B27 pos>  Father has AS, Suhayla is HLA-B27 pos but w/o other changes & continues to do well...  Vitamin D Defic>  Vit D level is down at 17... rec incr OTC supplement to at least 2000u daily...  ADD>  She remains on ADDERALL 20mg /d which we fill for her according to our [facility] office protocol; she continues to feel benefit from this med & wants to continue as is...  Hx Shingles/ Dermatitis>  She is followed by [facility] & she had the Shingles vaccine in 2012...     Plan:     Patient's Medications  New Prescriptions      VITAMIN D Supplement OTC...  Take 2000u daily  Previous Medications   ASPIRIN 81 MG TABLET    Take 81 mg by mouth daily.     CALCIUM CARBONATE-VITAMIN D (CALCIUM + D PO)    Take by mouth daily.     CETIRIZINE HCL (ZYRTEC PO)    Take by mouth.     MULTIPLE VITAMINS-MINERALS  (PROTEGRA CARDIO PO)    Take by mouth.     SIMVASTATIN (ZOCOR) 40 MG TABLET    OFF Simva40 ==> ON Atorva40   Modified Medications = REFILLS   Modified Medication Previous Medication  AMPHETAMINE-DEXTROAMPHETAMINE (ADDERALL XR, 20MG ,) 20 MG 24 HR CAPSULE amphetamine-dextroamphetamine (ADDERALL XR, 20MG ,) 20 MG 24 hr capsule      Take 1 tablet by mouth once a day...  DO NOT FILL BEFORE 10/18/2011    Take 1 tablet by mouth once a day...  DO NOT FILL BEFORE 07/18/2011   ATORVASTATIN (LIPITOR) 40 MG TABLET atorvastatin (LIPITOR) 40 MG tablet      Take 1 tablet (40 mg total) by mouth daily.    Take 1 tablet (40 mg total) by mouth daily.   ESTRADIOL (ESTRACE) 1 MG TABLET estradiol (ESTRACE) 1 MG tablet      Take 1 tablet (1 mg total) by mouth daily.    Take 1 tablet (1 mg total) by mouth daily.  Discontinued Medications   AMPHETAMINE-DEXTROAMPHETAMINE (ADDERALL XR, 20MG ,) 20 MG 24 HR CAPSULE    Take 1 tablet by mouth once a day.  DO NOT FILL BEFORE 06-18-2011

## 2011-08-14 ENCOUNTER — Other Ambulatory Visit (INDEPENDENT_AMBULATORY_CARE_PROVIDER_SITE_OTHER): Payer: BC Managed Care – PPO

## 2011-08-14 DIAGNOSIS — Z0000 Encounter for general adult medical examination without abnormal findings: Secondary | ICD-10-CM

## 2011-08-14 LAB — URINALYSIS, ROUTINE W REFLEX MICROSCOPIC
Ketones, ur: NEGATIVE
Leukocytes, UA: NEGATIVE
Nitrite: NEGATIVE
Specific Gravity, Urine: >=1.03 (ref 1.000–1.030)
pH: 6 (ref 5.0–8.0)

## 2011-08-14 LAB — LIPID PANEL
Cholesterol: 146 mg/dL (ref 0–200)
LDL Cholesterol: 52 mg/dL (ref 0–99)
Triglycerides: 103 mg/dL (ref 0.0–149.0)

## 2011-08-14 LAB — CBC WITH DIFFERENTIAL/PLATELET
Basophils Relative: 0.8 % (ref 0.0–3.0)
Eosinophils Relative: 1.7 % (ref 0.0–5.0)
Lymphocytes Relative: 26.8 % (ref 12.0–46.0)
Monocytes Relative: 6.6 % (ref 3.0–12.0)
Neutrophils Relative %: 64.1 % (ref 43.0–77.0)
RBC: 5.01 Mil/uL (ref 3.87–5.11)
WBC: 9.4 10*3/uL (ref 4.5–10.5)

## 2011-08-14 LAB — BASIC METABOLIC PANEL
BUN: 12 mg/dL (ref 6–23)
CO2: 27 mEq/L (ref 19–32)
Chloride: 109 mEq/L (ref 96–112)
Creatinine, Ser: 0.9 mg/dL (ref 0.4–1.2)

## 2011-08-14 LAB — HEPATIC FUNCTION PANEL
ALT: 15 U/L (ref 0–35)
Total Bilirubin: 0.4 mg/dL (ref 0.3–1.2)
Total Protein: 6.6 g/dL (ref 6.0–8.3)

## 2011-08-15 LAB — VITAMIN D 25 HYDROXY (VIT D DEFICIENCY, FRACTURES): Vit D, 25-Hydroxy: 17 ng/mL — ABNORMAL LOW (ref 30–89)

## 2011-08-16 ENCOUNTER — Other Ambulatory Visit: Payer: Self-pay | Admitting: *Deleted

## 2011-08-16 MED ORDER — VITAMIN D 50 MCG (2000 UT) PO TABS: 2000.0000 [IU] | ORAL_TABLET | Freq: Every day | ORAL | Status: AC

## 2011-11-07 ENCOUNTER — Telehealth: Payer: Self-pay | Admitting: Pulmonary Disease

## 2011-11-07 MED ORDER — AMPHETAMINE-DEXTROAMPHET ER 20 MG PO CP24: ORAL_CAPSULE | ORAL | Status: DC

## 2011-11-07 NOTE — Telephone Encounter (Signed)
Pt last RX was given on 08-08-11 to not fill until 10-18-11. For # 30. Please advise if ok to refill med. Carron Curie, CMA

## 2011-11-07 NOTE — Telephone Encounter (Signed)
Called and spoke with pt and she is aware that rx are up front and ready to be picked up.  Pt voiced her understanding and nothing further is needed.

## 2011-11-07 NOTE — Telephone Encounter (Signed)
rx have been printed out and placed on SN cart to be signed and i will call pt once these are ready.

## 2011-12-19 ENCOUNTER — Encounter: Payer: BC Managed Care – PPO | Admitting: Obstetrics and Gynecology

## 2011-12-24 ENCOUNTER — Encounter: Payer: Self-pay | Admitting: Obstetrics and Gynecology

## 2011-12-24 ENCOUNTER — Other Ambulatory Visit (HOSPITAL_COMMUNITY)
Admission: RE | Admit: 2011-12-24 | Discharge: 2011-12-24 | Disposition: A | Discharge status: Home / Self Care | Payer: BC Managed Care – PPO | Source: Ambulatory Visit | Attending: Obstetrics and Gynecology | Admitting: Obstetrics and Gynecology

## 2011-12-24 ENCOUNTER — Ambulatory Visit (INDEPENDENT_AMBULATORY_CARE_PROVIDER_SITE_OTHER): Payer: BC Managed Care – PPO | Admitting: Obstetrics and Gynecology

## 2011-12-24 VITALS — BP 144/86 | Ht 65.0 in | Wt 120.0 lb

## 2011-12-24 DIAGNOSIS — Z01419 Encounter for gynecological examination (general) (routine) without abnormal findings: Secondary | ICD-10-CM | POA: Insufficient documentation

## 2011-12-24 DIAGNOSIS — N890 Mild vaginal dysplasia: Secondary | ICD-10-CM

## 2011-12-24 DIAGNOSIS — N893 Dysplasia of vagina, unspecified: Secondary | ICD-10-CM

## 2011-12-24 DIAGNOSIS — Z780 Asymptomatic menopausal state: Secondary | ICD-10-CM

## 2011-12-24 DIAGNOSIS — Z1151 Encounter for screening for human papillomavirus (HPV): Secondary | ICD-10-CM | POA: Insufficient documentation

## 2011-12-24 MED ORDER — ESTRADIOL 1 MG PO TABS: 1.0000 mg | ORAL_TABLET | Freq: Every day | ORAL | Status: DC

## 2011-12-24 NOTE — Progress Notes (Signed)
Patient came to see me today for her annual GYN exam. She remains on oral estradiol for relief of vasomotor symptoms. She is having no vaginal bleeding. She is having no pelvic pain. She is due for her mammogram next month. In 1999 she had a total abdominal hysterectomy for fibroids. At that time her cervix showed no dysplasia. Prior to surgery she had had normal Pap smears. 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 Colposcopy with biopsy confirmed the vain. She has had yearly normal Pap smears since then. She does her lab and her bone densities through her PCP.  HEENT: Within normal limits.Kennon Portela present. Neck: No masses. Supraclavicular lymph nodes: Not enlarged. Breasts: Examined in both sitting and lying position. Symmetrical without skin changes or masses. Abdomen: Soft no masses guarding or rebound. No hernias. Pelvic: External within normal limits. BUS within normal limits. Vaginal examination shows good estrogen effect, no cystocele enterocele or rectocele. Cervix and uterus absent. Adnexa within normal limits. Rectovaginal confirmatory. Extremities within normal limits.  Assessment: #1.VAIN-1. #2. Menopausal symptoms  Plan: Pap and HPV typing done.The new Pap smear guidelines were discussed with the patient. Mammogram next month. Discussed estrogen patch. Patient declined. Estradiol 1 mg by mouth daily.

## 2011-12-24 NOTE — Patient Instructions (Signed)
Schedule mammogram for November.

## 2011-12-25 LAB — URINALYSIS W MICROSCOPIC + REFLEX CULTURE
Bilirubin Urine: NEGATIVE
Casts: NONE SEEN
Crystals: NONE SEEN
Glucose, UA: NEGATIVE mg/dL
Ketones, ur: NEGATIVE mg/dL
Specific Gravity, Urine: 1.014 (ref 1.005–1.030)
pH: 5.5 (ref 5.0–8.0)

## 2011-12-27 ENCOUNTER — Other Ambulatory Visit: Payer: Self-pay | Admitting: Obstetrics and Gynecology

## 2011-12-27 DIAGNOSIS — N390 Urinary tract infection, site not specified: Secondary | ICD-10-CM

## 2011-12-27 MED ORDER — SULFAMETHOXAZOLE-TRIMETHOPRIM 800-160 MG PO TABS: 1.0000 | ORAL_TABLET | Freq: Two times a day (BID) | ORAL | Status: DC

## 2011-12-28 ENCOUNTER — Encounter: Payer: Self-pay | Admitting: Obstetrics and Gynecology

## 2012-01-08 ENCOUNTER — Encounter: Payer: Self-pay | Admitting: Obstetrics and Gynecology

## 2012-02-13 ENCOUNTER — Telehealth: Payer: Self-pay | Admitting: Pulmonary Disease

## 2012-02-13 MED ORDER — AMPHETAMINE-DEXTROAMPHET ER 20 MG PO CP24: ORAL_CAPSULE | ORAL | Status: DC

## 2012-02-13 NOTE — Telephone Encounter (Signed)
Rx's printed and placed for Dr Kriste Basque to sign.  Verified address with pt and informed that rx was to be signed by Dr Kriste Basque and placed in the mail.

## 2012-04-28 ENCOUNTER — Telehealth: Payer: Self-pay | Admitting: Pulmonary Disease

## 2012-04-28 MED ORDER — AMOXICILLIN-POT CLAVULANATE 875-125 MG PO TABS: 1.0000 | ORAL_TABLET | Freq: Two times a day (BID) | ORAL | Status: DC

## 2012-04-28 NOTE — Telephone Encounter (Signed)
Per SN----  Ok to send in augmentin 875 mg  1 po bid  #14 mucinex 2 po bid and increase fluids Align once daily.  Called and spoke with pt and she is aware of SN recs and of medication sent to the pharmacy---nothing further is needed.

## 2012-04-28 NOTE — Telephone Encounter (Signed)
Spoke with pt  She states that having nasal and chest congestion, prod cough with moderate yellow sputum, and low grade temp x 3 wks Taking mucinex and simply saline without relief Pt denies any SOB, CP, wheezing or chest tightness Please advise, thanks! Allergies  Allergen Reactions  . Latex   . Nickel    Pt last seen here 08/08/11 Next ov not yet pending

## 2012-05-22 ENCOUNTER — Telehealth: Payer: Self-pay | Admitting: Pulmonary Disease

## 2012-05-22 NOTE — Telephone Encounter (Signed)
Last OV 08/08/2011 Was last given a 90 day supply in 02/13/12.  Please advise if this is okay to refill. Thanks.

## 2012-05-23 MED ORDER — AMPHETAMINE-DEXTROAMPHET ER 20 MG PO CP24: ORAL_CAPSULE | ORAL | Status: DC

## 2012-05-23 NOTE — Telephone Encounter (Signed)
rx have been placed in the mail to the pt.  Nothing further is needed.

## 2012-05-23 NOTE — Telephone Encounter (Signed)
rx have been printed out and placed on SN cart to be signed and will mail to the pt once this has been done.

## 2012-08-11 ENCOUNTER — Encounter: Payer: Self-pay | Admitting: Pulmonary Disease

## 2012-08-11 ENCOUNTER — Ambulatory Visit (INDEPENDENT_AMBULATORY_CARE_PROVIDER_SITE_OTHER): Payer: BC Managed Care – PPO | Admitting: Pulmonary Disease

## 2012-08-11 ENCOUNTER — Ambulatory Visit (INDEPENDENT_AMBULATORY_CARE_PROVIDER_SITE_OTHER)
Admission: RE | Admit: 2012-08-11 | Discharge: 2012-08-11 | Disposition: A | Discharge status: Home / Self Care | Payer: BC Managed Care – PPO | Source: Ambulatory Visit | Attending: Pulmonary Disease | Admitting: Pulmonary Disease

## 2012-08-11 VITALS — BP 116/72 | HR 73 | Temp 97.9°F | Ht 64.75 in | Wt 121.2 lb

## 2012-08-11 DIAGNOSIS — F988 Other specified behavioral and emotional disorders with onset usually occurring in childhood and adolescence: Secondary | ICD-10-CM

## 2012-08-11 DIAGNOSIS — L259 Unspecified contact dermatitis, unspecified cause: Secondary | ICD-10-CM

## 2012-08-11 DIAGNOSIS — D126 Benign neoplasm of colon, unspecified: Secondary | ICD-10-CM

## 2012-08-11 DIAGNOSIS — Z0000 Encounter for general adult medical examination without abnormal findings: Secondary | ICD-10-CM

## 2012-08-11 DIAGNOSIS — M459 Ankylosing spondylitis of unspecified sites in spine: Secondary | ICD-10-CM

## 2012-08-11 DIAGNOSIS — F17210 Nicotine dependence, cigarettes, uncomplicated: Secondary | ICD-10-CM

## 2012-08-11 DIAGNOSIS — F17200 Nicotine dependence, unspecified, uncomplicated: Secondary | ICD-10-CM

## 2012-08-11 DIAGNOSIS — E7800 Pure hypercholesterolemia, unspecified: Secondary | ICD-10-CM

## 2012-08-11 DIAGNOSIS — J309 Allergic rhinitis, unspecified: Secondary | ICD-10-CM

## 2012-08-11 MED ORDER — AMPHETAMINE-DEXTROAMPHET ER 20 MG PO CP24: ORAL_CAPSULE | ORAL | Status: DC

## 2012-08-11 MED ORDER — ATORVASTATIN CALCIUM 40 MG PO TABS: 40.0000 mg | ORAL_TABLET | Freq: Every day | ORAL | Status: DC

## 2012-08-11 NOTE — Patient Instructions (Addendum)
Today we updated your med list in our EPIC system...    Continue your current medications the same...    We refilled the meds you requested...  Today we did your follow up CXR... Please return to our lab one morning this week for your FASTING blood work...    We will contact you w/ the results when available...   Keep up the efforts directed toward smoking cessation- you can do it!!!  Call for any questions...  Let's plan a follow up visit in 53yr, sooner if needed for problems.Marland KitchenMarland Kitchen

## 2012-08-19 ENCOUNTER — Encounter: Payer: Self-pay | Admitting: Pulmonary Disease

## 2012-08-19 NOTE — Progress Notes (Signed)
Subjective:     Patient ID: Desiree Salinas, female   DOB: Jul 31, 1950, 62 y.o.   MRN: [id number]  HPI  62 y/o WF here for a follow up visit...   ~  Oct10:  she has had a good year overall and remains on ADDERALL XR 20mg /d which we refill for her regularly, BMD 8/10 was normal, still smokes occas & CXR is clear, FLP on Simva40 was good in 2009 but not as good this yr & we discussed diet Rx (see below)... she notes some prob w/ staying asleep & this may be an issue w/ sleep hygiene- we discussed this & poss strategies to help, she will call if meds needed.  ~  May 11, 2010:  Desiree Salinas remains stable on meds as below, but still smoking ~1/2ppd & we discussed smoking cessation strategies, offered Chantix, etc;  CXR remains clear w/ min apical pleural thickening noted;  EKG w/ NSR, WNL;  Labs showed FLP not at goal w/ LDL 139 on the Simva40 & we discussed changing to Lipitor 40mg /d to see if this works better for her (plus low chol/ low fat diet etc);  she sees DrGottsegen for GYN on Estradiol Rx;  she continues on the Adderall therapy which she feels she still benefits from & she requests to continue per our protocol...  ~  August 08, 2011:  32mo ROV & CPX> Desiree Salinas has had a good year- Daugh had 2nd child, [name], & lives in [place]; medically she notes some crepitus in knees but still plays tennis & dances w/o pain etc; she's noted some insomnia- able to fall asleep OK but wakes up & unable to fall back to sleep; as noted she is still taking the Adderall for ADD & we discussed it's effect on sleep, don't take too late in the day etc; I'm reluctant to add sleeping meds or Benzo on top of this & she will try OTC meds like TylenolPM, Melatonin, etc...  We reviewed prob list, meds, xrays and labs> see below>>  OK TDAP today... CXR 6/13 showed normal heart size, clear lungs, NAD... LABS 6/13:  FLP- at goals on Lip40;  Chems- wnl;  CBC- wnl;  TSH=1.88;  VitD=17;  UA- ok...  ~  August 11, 2012:  Yearly ROV & CPX> Desiree Salinas  has been doing reasonably well but notes incr stress, worries, palpit, insomnia (daughter & grandchild in [place]);  She's been using an E-cig in order to cut back on cigarettes, and we discussed the need to step down the nicotine etc...  We reviewed the following medical problems during today's office visit >>     AR, AB, Smoker> not on breathing meds & we've encouraged smoking cessation efforts...    CHOL> on Lip40; FLP 6/13 showed TChol 148, TG 103, HDL 73, LDL 52... She will ret for f/u FLP.    GI- colon polyps> last colon 10/06 by Dorris Singh w/ one 3mm hyperplastic polyp & f/u planned 80yrs...    GYN- DrGottsegen on Estrace...    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    VitD defic> she had stopped her calcium, MVI, VitD 2000 & advised to re-start these supplements...    ADD> on Adderall20/d & she feels it really helps & that she needs to continue, refilled...    Dermatitis> she has a few ecchymoses, she checks w/ DrTaffeen yearly...  We reviewed prob  list, meds, xrays and labs> see below for updates >>  CXR 6/14 showed norm heart size, clear lungs, NAD... LABS 6/14:  pending...   Problem List:  ALLERGIC RHINITIS (ICD-477.9) - she uses Zyrtek & saline as needed...  ACUTE BRONCHITIS (ICD-466.0) - hx recurrent bronchitis in the past... CIGARETTE SMOKER >> prev ~1/2ppd but she hints that maybe it's less now; we discussed smoking cessation options but she is not interested. ~  CXR 9/10 showed clear, NAD.Marland Kitchen. ~  CXR 10/10 showed clear, WNL.Marland Kitchen. ~  CXR 3/12 remains clear w/ min apical pleural thickening noted. ~  CXR 6/13 showed normal heart size, clear lungs, NAD.Marland Kitchen. ~  CXR 6/14 showed norm heart size, clear lungs, NAD...   HYPERCHOLESTEROLEMIA (ICD-272.0) - on Simva40 since 2008, she was changed to ATORVASTATIN 40mg /d in 2012> ~  FLP 7/08 on Lip20 showed TChol 208, TG 90, HDL 64, LDL 122...  changed to Simva40... ~  FLP 8/09 on Simva40 showed TChol 173, TG 107, HDL 57, LDL 95... rec> same... ~  FLP 9/10 on Simva40 showed TChol 220, TG 179, HDL 58, LDL 138... rec> better diiet, same med for now. ~  FLP 4/11 on Simva40 showed TChol 201, TG 80, HDL 76, LDL 114... Continue same. ~  FLP 3/12 on Simva40 showed TChol 207, TG 204, HDL 57, LDL 139... Rec> ch to [id number]. ~  FLP 6/13 on Lip40 showed TChol TChol 146, TG 103, HDL 73, LDL 52... Continue same. ~  FLP 6/14 on Lip40 => pending  COLONIC POLYPS (ICD-211.3) - colonoscopy 10/06 by Dorris Singh showed 3mm polyp= hyperplastic... follow up planned 56yrs...  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 she is active, exerc regularly, no on-going ortho complaints... ~  3/12:  she notes left thumb tenosynovitis w/ injection & splint rx per DrSypher; she indicates that she requires periodic injections for this problem. ~  6/13:  she notes some crepitus in knees but no pain & continues to play tennis & dance regularly...  VITAMIN D DEFICIENCY >> rec to take ~2000u Vit D supplement OTC daily... ~  Labs 10/10 showed Vit D level = 40... Continue OTC supplement ~1000u daily... ~  Labs 6/13 showed Vit D level = 17... rec to incr Vit D supplement to ~2000u daily...  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  ~  6/13:  she wishes to continue this med and Rx written per our protocol.  SHINGLES, HX OF (ICD-V13.8) > she had a mild outbreak in 2011 in left V1 distrib & was treated by DrTaffeen; we discussed the Shingles vaccine & wrote Rx 3/12==> [facility]  DERMATITIS (ICD-692.9) - followed by DrTafeen...  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 she had a BMD here 8/08  w/ normal TScores... repeat BMD here 8/10 showed TScores +0.2 in Spine, and -0.6 in right FemNeck... Vit D level here 6/13 = 17, rec supplement orally... continue Calcium, Vits, Vit D 2000/d...   Past Surgical History  Procedure Laterality Date  . Abdominal surgery  1999    Abdominoplasty  . Abdominal hysterectomy  1999    TAH  . Tonsillectomy and adenoidectomy    . Augmentation mammaplasty      Implants  . Tubal ligation  Outpatient Encounter Prescriptions as of 08/11/2012  Medication Sig Dispense Refill  . amphetamine-dextroamphetamine (ADDERALL XR) 20 MG 24 hr capsule Take 1 tablet by mouth once a day...  DO NOT FILL BEFORE 11/01/2012  30 capsule  0  . atorvastatin (LIPITOR) 40 MG tablet Take 1 tablet (40 mg total) by mouth daily.  90 tablet  3  . cetirizine (ZYRTEC) 10 MG tablet Take 10 mg by mouth daily as needed for allergies.      . Diphenhydramine-APAP, sleep, (TYLENOL PM EXTRA STRENGTH PO) Take by mouth as needed.       Marland Kitchen estradiol (ESTRACE) 1 MG tablet Take 1 tablet (1 mg total) by mouth daily.  90 tablet  4    Allergies  Allergen Reactions  . Latex   . Nickel     Current Medications, Allergies, Past Medical History, Past Surgical History, Family History, and Social History were reviewed in [employer] record.   Review of Systems         The patient complains of joint pain and stiffness.  The patient denies fever, chills, sweats, anorexia, fatigue, weakness, malaise, weight loss, sleep disorder, blurring, diplopia, eye irritation, eye discharge, vision loss, eye pain, photophobia, earache, ear discharge, tinnitus, decreased hearing, nasal congestion, nosebleeds, sore throat, hoarseness, chest pain, palpitations, syncope, dyspnea on exertion, orthopnea, PND, peripheral edema, cough, dyspnea at rest, excessive sputum, hemoptysis, wheezing, pleurisy, nausea, vomiting, diarrhea, constipation, change in bowel habits, abdominal pain, melena,  hematochezia, jaundice, gas/bloating, indigestion/heartburn, dysphagia, odynophagia, dysuria, hematuria, urinary frequency, urinary hesitancy, nocturia, incontinence, back pain, joint swelling, muscle cramps, muscle weakness, arthritis, sciatica, restless legs, leg pain at night, leg pain with exertion, rash, itching, dryness, suspicious lesions, paralysis, paresthesias, seizures, tremors, vertigo, transient blindness, frequent falls, frequent headaches, difficulty walking, depression, anxiety, memory loss, confusion, cold intolerance, heat intolerance, polydipsia, polyphagia, polyuria, unusual weight change, abnormal bruising, bleeding, enlarged lymph nodes, urticaria, allergic rash, hay fever, and recurrent infections.     Objective:   Physical Exam      WD, WN, 62 y/o WF in NAD... GENERAL:  Alert & oriented; pleasant & cooperative... HEENT:  [place]/AT, EOM-wnl, PERRLA, EACs-clear, TMs-wnl, NOSE-clear, THROAT-clear & wnl. NECK:  Supple w/ fairROM; no JVD; normal carotid impulses w/o bruits; no thyromegaly or nodules palpated; no lymphadenopathy. CHEST:  Clear to P & A; without wheezes/ rales/ or rhonchi. HEART:  Regular Rhythm; without murmurs/ rubs/ or gallops. ABDOMEN:  Soft & nontender; normal bowel sounds; no organomegaly or masses detected. EXT: without deformities, mild arthritic changes; no varicose veins/ venous insuffic/ or edema. NEURO:  CN's intact; motor testing normal; sensory testing normal; gait normal & balance OK. DERM:  No lesions noted; no rash etc...  RADIOLOGY DATA:  Reviewed in the EPIC EMR & discussed w/ the patient...  LABORATORY DATA:  Reviewed in the EPIC EMR & discussed w/ the patient...   Assessment:     CPX>>  We gave her the TDAP 6/13  AR/ Smoker/ Hx Bronchits>  We discussed smoking cessation options but she is not interested in med rx; f/u CXR remains clear & she is asymptomatic x min cough; remains active w/ tennis & dance...  CHOL>  Controlled on low chol/  low fat diet + ATORVA40 & FLP is improved...  Hx Colon Polyps>  She had 3mm polyp removed 2006 by DrKaplan & it was hyperplastic w/ f/u suggested in 78yrs; clinically stable, no problems, no blood seen, etc...  AS/ HLA-B27 pos>  Father has AS, Desiree Salinas is HLA-B27  pos but w/o other changes & continues to do well...  Vitamin D Defic>  Vit D level is down at 17... rec incr OTC supplement to at least 2000u daily...  ADD>  She remains on ADDERALL 20mg /d which we fill for her according to our [facility] office protocol; she continues to feel benefit from this med & wants to continue as is...  Hx Shingles/ Dermatitis>  She is followed by [facility] & she had the Shingles vaccine in 2012...     Plan:     Patient's Medications  New Prescriptions      VITAMIN D Supplement OTC...  Take 2000u daily  Previous Medications   ASPIRIN 81 MG TABLET    Take 81 mg by mouth daily.     CALCIUM CARBONATE-VITAMIN D (CALCIUM + D PO)    Take by mouth daily.     CETIRIZINE HCL (ZYRTEC PO)    Take by mouth.     MULTIPLE VITAMINS-MINERALS (PROTEGRA CARDIO PO)    Take by mouth.     SIMVASTATIN (ZOCOR) 40 MG TABLET    OFF Simva40 ==> ON Atorva40   Modified Medications = REFILLS   Modified Medication Previous Medication   AMPHETAMINE-DEXTROAMPHETAMINE (ADDERALL XR, 20MG ,) 20 MG 24 HR CAPSULE amphetamine-dextroamphetamine (ADDERALL XR, 20MG ,) 20 MG 24 hr capsule      Take 1 tablet by mouth once a day...  DO NOT FILL BEFORE 10/18/2011    Take 1 tablet by mouth once a day...  DO NOT FILL BEFORE 07/18/2011   ATORVASTATIN (LIPITOR) 40 MG TABLET atorvastatin (LIPITOR) 40 MG tablet      Take 1 tablet (40 mg total) by mouth daily.    Take 1 tablet (40 mg total) by mouth daily.   ESTRADIOL (ESTRACE) 1 MG TABLET estradiol (ESTRACE) 1 MG tablet      Take 1 tablet (1 mg total) by mouth daily.    Take 1 tablet (1 mg total) by mouth daily.  Discontinued Medications   AMPHETAMINE-DEXTROAMPHETAMINE (ADDERALL XR, 20MG ,) 20 MG 24 HR CAPSULE     Take 1 tablet by mouth once a day.  DO NOT FILL BEFORE 06-18-2011

## 2012-08-22 ENCOUNTER — Other Ambulatory Visit (INDEPENDENT_AMBULATORY_CARE_PROVIDER_SITE_OTHER): Payer: BC Managed Care – PPO

## 2012-08-22 DIAGNOSIS — Z0000 Encounter for general adult medical examination without abnormal findings: Secondary | ICD-10-CM

## 2012-08-22 DIAGNOSIS — N390 Urinary tract infection, site not specified: Secondary | ICD-10-CM

## 2012-08-22 LAB — BASIC METABOLIC PANEL
BUN: 12 mg/dL (ref 6–23)
Chloride: 105 mEq/L (ref 96–112)
Glucose, Bld: 108 mg/dL — ABNORMAL HIGH (ref 70–99)
Potassium: 4.4 mEq/L (ref 3.5–5.1)

## 2012-08-22 LAB — HEPATIC FUNCTION PANEL
ALT: 13 U/L (ref 0–35)
AST: 14 U/L (ref 0–37)
Albumin: 3.9 g/dL (ref 3.5–5.2)

## 2012-08-22 LAB — LIPID PANEL
Cholesterol: 167 mg/dL (ref 0–200)
VLDL: 26.4 mg/dL (ref 0.0–40.0)

## 2012-08-22 LAB — TSH: TSH: 1.89 u[IU]/mL (ref 0.35–5.50)

## 2012-08-23 LAB — VITAMIN D 25 HYDROXY (VIT D DEFICIENCY, FRACTURES): Vit D, 25-Hydroxy: 38 ng/mL (ref 30–89)

## 2012-08-23 LAB — URINALYSIS W MICROSCOPIC + REFLEX CULTURE
Bacteria, UA: NONE SEEN
Casts: NONE SEEN
Hgb urine dipstick: NEGATIVE
Ketones, ur: NEGATIVE mg/dL
Nitrite: NEGATIVE
Protein, ur: NEGATIVE mg/dL
pH: 6 (ref 5.0–8.0)

## 2012-08-23 LAB — CBC WITH DIFFERENTIAL/PLATELET
Eosinophils Relative: 2.7 % (ref 0.0–5.0)
Monocytes Relative: 6.6 % (ref 3.0–12.0)
Neutrophils Relative %: 57.9 % (ref 43.0–77.0)
Platelets: 264 10*3/uL (ref 150.0–400.0)
WBC: 9.1 10*3/uL (ref 4.5–10.5)

## 2012-09-04 ENCOUNTER — Other Ambulatory Visit: Payer: Self-pay

## 2012-09-04 DIAGNOSIS — Z780 Asymptomatic menopausal state: Secondary | ICD-10-CM

## 2012-09-04 MED ORDER — ESTRADIOL 1 MG PO TABS: 1.0000 mg | ORAL_TABLET | Freq: Every day | ORAL | Status: DC

## 2012-10-09 ENCOUNTER — Other Ambulatory Visit: Payer: Self-pay

## 2012-10-09 DIAGNOSIS — Z780 Asymptomatic menopausal state: Secondary | ICD-10-CM

## 2012-10-09 MED ORDER — ESTRADIOL 1 MG PO TABS: 1.0000 mg | ORAL_TABLET | Freq: Every day | ORAL | Status: DC

## 2012-10-09 NOTE — Telephone Encounter (Signed)
Rx escribed to [facility].

## 2012-10-09 NOTE — Telephone Encounter (Signed)
Patient needs her estrogen and said her pharmacy does not have it. I told her I escribed 90 day supply to [facility] on 09/04/12. She said they do not have it. She asked me to send it to [facility]. I told her it would be 90 day with no refills as she is due for CE in OCtober.  She said she will call back to schedule.

## 2012-12-26 ENCOUNTER — Ambulatory Visit (INDEPENDENT_AMBULATORY_CARE_PROVIDER_SITE_OTHER): Payer: BC Managed Care – PPO | Admitting: Gynecology

## 2012-12-26 ENCOUNTER — Encounter: Payer: Self-pay | Admitting: Gynecology

## 2012-12-26 VITALS — BP 120/70 | Ht 65.0 in | Wt 123.0 lb

## 2012-12-26 DIAGNOSIS — N893 Dysplasia of vagina, unspecified: Secondary | ICD-10-CM

## 2012-12-26 DIAGNOSIS — N898 Other specified noninflammatory disorders of vagina: Secondary | ICD-10-CM

## 2012-12-26 DIAGNOSIS — N951 Menopausal and female climacteric states: Secondary | ICD-10-CM

## 2012-12-26 DIAGNOSIS — Z79890 Hormone replacement therapy (postmenopausal): Secondary | ICD-10-CM

## 2012-12-26 DIAGNOSIS — Z780 Asymptomatic menopausal state: Secondary | ICD-10-CM

## 2012-12-26 DIAGNOSIS — Z01419 Encounter for gynecological examination (general) (routine) without abnormal findings: Secondary | ICD-10-CM

## 2012-12-26 MED ORDER — ESTRADIOL 1 MG PO TABS: 1.0000 mg | ORAL_TABLET | Freq: Every day | ORAL | Status: DC

## 2012-12-26 NOTE — Progress Notes (Addendum)
Desiree Salinas [birth year] [id number]        62 y.o.  [id number] for annual exam.  Former patient of Dr. Eda Paschal. Several issues noted below.  Past medical history,surgical history, medications, allergies, family history and social history were all reviewed and documented in the EPIC chart.  ROS:  Performed and pertinent positives and negatives are included in the history, assessment and plan .  Exam: Kim assistant Filed Vitals:   12/26/12 1400  BP: 120/70  Height: 5\' 5"  (1.651 m)  Weight: 123 lb (55.792 kg)   General appearance  Normal Skin grossly normal Head/Neck normal with no cervical or supraclavicular adenopathy thyroid normal Lungs  clear Cardiac RR, without RMG Abdominal  soft, nontender, without masses, organomegaly or hernia Breasts  examined lying and sitting without masses, retractions, discharge or axillary adenopathy. Bilateral implants noted Pelvic  Ext/BUS/vagina  normal with atrophic genital changes   Adnexa  Without masses or tenderness    Anus and perineum  normal   Rectovaginal  normal sphincter tone without palpated masses or tenderness.    Assessment/Plan:  62 y.o. [id number] female for annual exam.   1. Postmenopausal/menopausal symptoms/HRT status post TAH 1999 for leiomyoma. Had been on Estrace 1 mg replacement. It ran out several months ago and is having unacceptable hot flushes night sweats and inability to sleep. Wants to reinitiate HRT.  I reviewed the whole issue of HRT with her to include the WHI study with increased risk of stroke, heart attack, DVT and breast cancer. The ACOG and NAMS statements for lowest dose for the shortest period of time reviewed. Transdermal versus oral first-pass effect benefit discussed.  Patient understands and accepts. She declines transdermal and prefers oral accepting the risks. Estradiol 1 mg x1 year prescribed. 2. Mammography coming due in November and she knows to schedule this. SBE monthly reviewed. 3. Pap smear 2013. No  Pap smear done today. 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 Followup Pap smears have been normal x9. Plan repeat Pap smear at 3 year interval. 4. Bone density. Patient cannot remember her last bone density. We'll go ahead and schedule baseline now given her postmenopausal status and cigarette smoking. Increase calcium vitamin D reviewed. 5. Colonoscopy 2008. She said they did remove polyps. I've asked her to call her gastroenterologist to see when they want to repeat her colonoscopy. 6. Stop smoking strategies reviewed and encouraged with the patient. 7. Health maintenance. No blood work done as this is all done through her primary physician's office. Followup one year, sooner as needed.  Note: This document was prepared with digital dictation and possible smart phrase technology. Any transcriptional errors that result from this process are unintentional.   Dara Lords MD, 2:26 PM 12/26/2012

## 2012-12-26 NOTE — Patient Instructions (Addendum)
Followup in one year for annual exam.  Consider Stop Smoking.  Help is available at [facility]'s smoking cessation program @ www.[place].com or 336-832-0838. OR 1-800-QUIT-NOW (1-800-784-8669) for free smoking cessation counseling.  Smokefree.gov (http://www.smokefree.gov) provides free, accurate, evidence-based information and professional assistance to help support the immediate and long-term needs of people trying to quit smoking.    Smoking Hazards Smoking cigarettes is extremely bad for your health. Tobacco smoke has over 200 known poisons in it. There are over 60 chemicals in tobacco smoke that cause cancer. Some of the chemicals found in cigarette smoke include:  Cyanide.  Benzene.  Formaldehyde.  Methanol (wood alcohol).  Acetylene (fuel used in welding torches).  Ammonia.  Cigarette smoke also contains the poisonous gases nitrogen oxide and carbon monoxide.  Cigarette smokers have an increased risk of many serious medical problems, including: Lung cancer.  Lung disease (such as pneumonia, bronchitis, and emphysema).  Heart attack and chest pain due to the heart not getting enough oxygen (angina).  Heart disease and peripheral blood vessel disease.  Hypertension.  Stroke.  Oral cancer (cancer of the lip, mouth, or voice box).  Bladder cancer.  Pancreatic cancer.  Cervical cancer.  Pregnancy complications, including premature birth.  Low birthweight babies.  Early menopause.  Lower estrogen level for women.  Infertility.  Facial wrinkles.  Blindness.  Increased risk of broken bones (fractures).  Senile dementia.  Stillbirths and smaller newborn babies, birth defects, and genetic damage to sperm.  Stomach ulcers and internal bleeding.  Children of smokers have an increased risk of the following, because of secondhand smoke exposure:  Sudden infant death syndrome (SIDS).  Respiratory infections.  Lung cancer.  Heart disease.  Ear infections.  Smoking causes  approximately: 90% of all lung cancer deaths in men.  80% of all lung cancer deaths in women.  90% of deaths from chronic obstructive lung disease.  Compared with nonsmokers, smoking increases the risk of: Coronary heart disease by 2 to 4 times.  Stroke by 2 to 4 times.  Men developing lung cancer by 23 times.  Women developing lung cancer by 13 times.  Dying from chronic obstructive lung diseases by 12 times.  Someone who smokes 2 packs a day loses about 8 years of his or her life. Even smoking lightly shortens your life expectancy by several years. You can greatly reduce the risk of medical problems for you and your family by stopping now. Smoking is the most preventable cause of death and disease in our society. Within days of quitting smoking, your circulation returns to normal, you decrease the risk of having a heart attack, and your lung capacity improves. There may be some increased phlegm in the first few days after quitting, and it may take months for your lungs to clear up completely. Quitting for 10 years cuts your lung cancer risk to almost that of a nonsmoker. WHY IS SMOKING ADDICTIVE? Nicotine is the chemical agent in tobacco that is capable of causing addiction or dependence.  When you smoke and inhale, nicotine is absorbed rapidly into the bloodstream through your lungs. Nicotine absorbed through the lungs is capable of creating a powerful addiction. Both inhaled and non-inhaled nicotine may be addictive.  Addiction studies of cigarettes and spit tobacco show that addiction to nicotine occurs mainly during the teen years, when young people begin using tobacco products.  WHAT ARE THE BENEFITS OF QUITTING?  There are many health benefits to quitting smoking.  Likelihood of developing cancer and heart disease   decreases. Health improvements are seen almost immediately.  Blood pressure, pulse rate, and breathing patterns start returning to normal soon after quitting.  People who quit  may see an improvement in their overall quality of life.  Some people choose to quit all at once. Other options include nicotine replacement products, such as patches, gum, and nasal sprays. Do not use these products without first checking with your caregiver. QUITTING SMOKING It is not easy to quit smoking. Nicotine is addicting, and longtime habits are hard to change. To start, you can write down all your reasons for quitting, tell your family and friends you want to quit, and ask for their help. Throw your cigarettes away, chew gum or cinnamon sticks, keep your hands busy, and drink extra water or juice. Go for walks and practice deep breathing to relax. Think of all the money you are saving: around $1,000 a year, for the average pack-a-day smoker. Nicotine patches and gum have been shown to improve success at efforts to stop smoking. Zyban (bupropion) is an anti-depressant drug that can be prescribed to reduce nicotine withdrawal symptoms and to suppress the urge to smoke. Smoking is an addiction with both physical and psychological effects. Joining a stop-smoking support group can help you cope with the emotional issues. For more information and advice on programs to stop smoking, call your doctor, your local hospital, or these organizations: American Lung Association - 1-800-LUNGUSA   Smoking Cessation  This document explains the best ways for you to quit smoking and new treatments to help. It lists new medicines that can double or triple your chances of quitting and quitting for good. It also considers ways to avoid relapses and concerns you may have about quitting, including weight gain. NICOTINE: A POWERFUL ADDICTION If you have tried to quit smoking, you know how hard it can be. It is hard because nicotine is a very addictive drug. For some people, it can be as addictive as heroin or cocaine. Usually, people make 2 or 3 tries, or more, before finally being able to quit. Each time you try to  quit, you can learn about what helps and what hurts. Quitting takes hard work and a lot of effort, but you can quit smoking. QUITTING SMOKING IS ONE OF THE MOST IMPORTANT THINGS YOU WILL EVER DO.  You will live longer, feel better, and live better.   The impact on your body of quitting smoking is felt almost immediately:   Within 20 minutes, blood pressure decreases. Pulse returns to its normal level.   After 8 hours, carbon monoxide levels in the blood return to normal. Oxygen level increases.   After 24 hours, chance of heart attack starts to decrease. Breath, hair, and body stop smelling like smoke.   After 48 hours, damaged nerve endings begin to recover. Sense of taste and smell improve.   After 72 hours, the body is virtually free of nicotine. Bronchial tubes relax and breathing becomes easier.   After 2 to 12 weeks, lungs can hold more air. Exercise becomes easier and circulation improves.   Quitting will reduce your risk of having a heart attack, stroke, cancer, or lung disease:   After 1 year, the risk of coronary heart disease is cut in half.   After 5 years, the risk of stroke falls to the same as a nonsmoker.   After 10 years, the risk of lung cancer is cut in half and the risk of other cancers decreases significantly.   After 15 years,   the risk of coronary heart disease drops, usually to the level of a nonsmoker.   If you are pregnant, quitting smoking will improve your chances of having a healthy baby.   The people you live with, especially your children, will be healthier.   You will have extra money to spend on things other than cigarettes.  FIVE KEYS TO QUITTING Studies have shown that these 5 steps will help you quit smoking and quit for good. You have the best chances of quitting if you use them together: 1. Get ready.  2. Get support and encouragement.  3. Learn new skills and behaviors.  4. Get medicine to reduce your nicotine addiction and use it  correctly.  5. Be prepared for relapse or difficult situations. Be determined to continue trying to quit, even if you do not succeed at first.  1. GET READY  Set a quit date.   Change your environment.   Get rid of ALL cigarettes, ashtrays, matches, and lighters in your home, car, and place of work.   Do not let people smoke in your home.   Review your past attempts to quit. Think about what worked and what did not.   Once you quit, do not smoke. NOT EVEN A PUFF!  2. GET SUPPORT AND ENCOURAGEMENT Studies have shown that you have a better chance of being successful if you have help. You can get support in many ways.  Tell your family, friends, and coworkers that you are going to quit and need their support. Ask them not to smoke around you.   Talk to your caregivers (doctor, dentist, nurse, pharmacist, psychologist, and/or smoking counselor).   Get individual, group, or telephone counseling and support. The more counseling you have, the better your chances are of quitting. Programs are available at local hospitals and health centers. Call your local health department for information about programs in your area.   Spiritual beliefs and practices may help some smokers quit.   Quit meters are small computer programs online or downloadable that keep track of quit statistics, such as amount of "quit-time," cigarettes not smoked, and money saved.   Many smokers find one or more of the many self-help books available useful in helping them quit and stay off tobacco.  3. LEARN NEW SKILLS AND BEHAVIORS  Try to distract yourself from urges to smoke. Talk to someone, go for a walk, or occupy your time with a task.   When you first try to quit, change your routine. Take a different route to work. Drink tea instead of coffee. Eat breakfast in a different place.   Do something to reduce your stress. Take a hot bath, exercise, or read a book.   Plan something enjoyable to do every day. Reward  yourself for not smoking.   Explore interactive web-based programs that specialize in helping you quit.  4. GET MEDICINE AND USE IT CORRECTLY Medicines can help you stop smoking and decrease the urge to smoke. Combining medicine with the above behavioral methods and support can quadruple your chances of successfully quitting smoking. The U.[place]. Food and Drug Administration (FDA) has approved 7 medicines to help you quit smoking. These medicines fall into 3 categories.  Nicotine replacement therapy (delivers nicotine to your body without the negative effects and risks of smoking):   Nicotine gum: Available over-the-counter.   Nicotine lozenges: Available over-the-counter.   Nicotine inhaler: Available by prescription.   Nicotine nasal spray: Available by prescription.   Nicotine skin patches (transdermal): Available   by prescription and over-the-counter.   Antidepressant medicine (helps people abstain from smoking, but how this works is unknown):   Bupropion sustained-release (SR) tablets: Available by prescription.   Nicotinic receptor partial agonist (simulates the effect of nicotine in your brain):   Varenicline tartrate tablets: Available by prescription.   Ask your caregiver for advice about which medicines to use and how to use them. Carefully read the information on the package.   Everyone who is trying to quit may benefit from using a medicine. If you are pregnant or trying to become pregnant, nursing an infant, you are under age 18, or you smoke fewer than 10 cigarettes per day, talk to your caregiver before taking any nicotine replacement medicines.   You should stop using a nicotine replacement product and call your caregiver if you experience nausea, dizziness, weakness, vomiting, fast or irregular heartbeat, mouth problems with the lozenge or gum, or redness or swelling of the skin around the patch that does not go away.   Do not use any other product containing nicotine  while using a nicotine replacement product.   Talk to your caregiver before using these products if you have diabetes, heart disease, asthma, stomach ulcers, you had a recent heart attack, you have high blood pressure that is not controlled with medicine, a history of irregular heartbeat, or you have been prescribed medicine to help you quit smoking.  5. BE PREPARED FOR RELAPSE OR DIFFICULT SITUATIONS  Most relapses occur within the first 3 months after quitting. Do not be discouraged if you start smoking again. Remember, most people try several times before they finally quit.   You may have symptoms of withdrawal because your body is used to nicotine. You may crave cigarettes, be irritable, feel very hungry, cough often, get headaches, or have difficulty concentrating.   The withdrawal symptoms are only temporary. They are strongest when you first quit, but they will go away within 10 to 14 days.  Here are some difficult situations to watch for:  Alcohol. Avoid drinking alcohol. Drinking lowers your chances of successfully quitting.   Caffeine. Try to reduce the amount of caffeine you consume. It also lowers your chances of successfully quitting.   Other smokers. Being around smoking can make you want to smoke. Avoid smokers.   Weight gain. Many smokers will gain weight when they quit, usually less than 10 pounds. Eat a healthy diet and stay active. Do not let weight gain distract you from your main goal, quitting smoking. Some medicines that help you quit smoking may also help delay weight gain. You can always lose the weight gained after you quit.   Bad mood or depression. There are a lot of ways to improve your mood other than smoking.  If you are having problems with any of these situations, talk to your caregiver. SPECIAL SITUATIONS AND CONDITIONS Studies suggest that everyone can quit smoking. Your situation or condition can give you a special reason to quit.  Pregnant women/new  mothers: By quitting, you protect your baby's health and your own.   Hospitalized patients: By quitting, you reduce health problems and help healing.   Heart attack patients: By quitting, you reduce your risk of a second heart attack.   Lung, head, and neck cancer patients: By quitting, you reduce your chance of a second cancer.   Parents of children and adolescents: By quitting, you protect your children from illnesses caused by secondhand smoke.  QUESTIONS TO THINK ABOUT Think about the following questions   before you try to stop smoking. You may want to talk about your answers with your caregiver.  Why do you want to quit?   If you tried to quit in the past, what helped and what did not?   What will be the most difficult situations for you after you quit? How will you plan to handle them?   Who can help you through the tough times? Your family? Friends? Caregiver?   What pleasures do you get from smoking? What ways can you still get pleasure if you quit?  Here are some questions to ask your caregiver:  How can you help me to be successful at quitting?   What medicine do you think would be best for me and how should I take it?   What should I do if I need more help?   What is smoking withdrawal like? How can I get information on withdrawal?  Quitting takes hard work and a lot of effort, but you can quit smoking.   

## 2012-12-27 LAB — URINALYSIS W MICROSCOPIC + REFLEX CULTURE
Bacteria, UA: NONE SEEN
Bilirubin Urine: NEGATIVE
Ketones, ur: NEGATIVE mg/dL
Nitrite: NEGATIVE
Protein, ur: NEGATIVE mg/dL
Specific Gravity, Urine: 1.024 (ref 1.005–1.030)
Urobilinogen, UA: 0.2 mg/dL (ref 0.0–1.0)

## 2012-12-29 ENCOUNTER — Telehealth: Payer: Self-pay | Admitting: Pulmonary Disease

## 2012-12-29 MED ORDER — AMPHETAMINE-DEXTROAMPHET ER 20 MG PO CP24: ORAL_CAPSULE | ORAL | Status: DC

## 2012-12-29 NOTE — Telephone Encounter (Signed)
Last OV 08/11/12 No pending OV Last fill 08/11/12  Pt would like 3 rx's if possible.  SN - please advise on refill. Thanks.

## 2012-12-30 NOTE — Telephone Encounter (Signed)
All 3 rx have been signed by SN and placed up front to be picked up.  i have called pt and she is aware.

## 2013-01-08 ENCOUNTER — Encounter: Payer: Self-pay | Admitting: Gynecology

## 2013-02-17 ENCOUNTER — Other Ambulatory Visit: Payer: Self-pay | Admitting: Gynecology

## 2013-02-17 ENCOUNTER — Ambulatory Visit (INDEPENDENT_AMBULATORY_CARE_PROVIDER_SITE_OTHER): Payer: BC Managed Care – PPO

## 2013-02-17 DIAGNOSIS — Z13820 Encounter for screening for osteoporosis: Secondary | ICD-10-CM

## 2013-02-17 DIAGNOSIS — Z780 Asymptomatic menopausal state: Secondary | ICD-10-CM

## 2013-03-27 ENCOUNTER — Other Ambulatory Visit: Payer: Self-pay | Admitting: Orthopedic Surgery

## 2013-04-02 ENCOUNTER — Ambulatory Visit (HOSPITAL_BASED_OUTPATIENT_CLINIC_OR_DEPARTMENT_OTHER)
Admission: RE | Admit: 2013-04-02 | Payer: BC Managed Care – PPO | Source: Ambulatory Visit | Admitting: Orthopedic Surgery

## 2013-04-02 ENCOUNTER — Encounter (HOSPITAL_BASED_OUTPATIENT_CLINIC_OR_DEPARTMENT_OTHER): Admission: RE | Payer: Self-pay | Source: Ambulatory Visit

## 2013-04-02 SURGERY — CARPOMETACARPEL (CMC) SUSPENSION PLASTY
Anesthesia: General | Laterality: Left

## 2013-05-08 ENCOUNTER — Telehealth: Payer: Self-pay | Admitting: Pulmonary Disease

## 2013-05-08 MED ORDER — AMPHETAMINE-DEXTROAMPHET ER 20 MG PO CP24: ORAL_CAPSULE | ORAL | Status: DC

## 2013-05-08 NOTE — Telephone Encounter (Signed)
Pt is aware that her prescriptions are ready for pick up. She is going to think about who she would like to be referred to for primary care and get back with [place].

## 2013-05-08 NOTE — Telephone Encounter (Signed)
Pt is requesting 3 prescriptions for Adderall.  Last OV 08/11/12 Verified with pt's pharmacy - last fill 03/19/13  SN - please advise on refill. Thanks.

## 2013-05-08 NOTE — Telephone Encounter (Signed)
May only have 3 days left.Desiree Salinas

## 2013-05-08 NOTE — Telephone Encounter (Signed)
Per SN---  Ok to refill the adderall (3)   rx for the pt.   rx have been printed out and will leave these up front for the pt to pick up.  Please let the pt know that SN will be retiring from primary care as of April 1 but will still be seeing pulmonary pts.  We will need to set the pt up with new primary care.  thanks

## 2013-05-13 ENCOUNTER — Telehealth: Payer: Self-pay | Admitting: Pulmonary Disease

## 2013-05-13 NOTE — Telephone Encounter (Signed)
I called and spoke with Desiree Salinas. She reports her therapists recommended she start on short term medication for panic attacks and depression. Was advised PCP will need to write for this. She wants to know if SN can start her on something until she gets new PCP. Please advise SN thanks --aware SN out this week Allergies  Allergen Reactions  . Eucerin [Basis Facial Moisturizer]     eucerin lotion causes an allergic skin reaction  . Latex   . Nickel

## 2013-05-18 NOTE — Telephone Encounter (Signed)
LMOMTCB x 1 

## 2013-05-18 NOTE — Telephone Encounter (Signed)
Per SN---  Will need to stop the adderal if having panic and depression.  And SN suggests eval by Dr. Evaristo Bury will ne be able to take all this together.

## 2013-05-19 NOTE — Telephone Encounter (Signed)
LMTCBx2. [place] Skalicky, CMA  

## 2013-05-20 NOTE — Telephone Encounter (Signed)
Called, spoke with pt.  Explained below to her per Dr. Lenna Gilford. She verbalized understanding.  Provided pt with Dr. Karen Chafe office # to call and schedule this appt per protocol as these appts must be scheduled by the pt.  Pt verbalized understanding, and states she will call to schedule this.  Pt states she still hasn't been set up with a new PCP.  Pt states she's not sure which dr or office she would like to establish with yet.  Pt states she will check on this.  Asked pt to pls call us back, so we can make a referral for this.  She verbalized understanding and is in agreement with this plan.  Will keep msg open to ensure appt and referral are made.

## 2013-05-25 NOTE — Telephone Encounter (Signed)
I spoke with the pt and she wants to stay at the [facility] location. I advised the only option here is the new MD that is starting in Sept, accepting appts in June. I provided her with the contact # and she states she will call in June to set an appt. [place] Bing, CMA

## 2013-07-23 ENCOUNTER — Telehealth: Payer: Self-pay | Admitting: Pulmonary Disease

## 2013-07-23 NOTE — Telephone Encounter (Signed)
lmomtcb X1 

## 2013-07-24 NOTE — Telephone Encounter (Signed)
Pt received a recall letter from our office to schedule her 1 year physical appt with Dr. Lenna Gilford. Please advise if ok to schedule an appt with you or does she need to be referred to another PCP? [place] Bing, CMA

## 2013-07-24 NOTE — Telephone Encounter (Signed)
Called spoke with pt. appt scheduled for June. Nothing further needed

## 2013-07-24 NOTE — Telephone Encounter (Signed)
Okay to schedule appt with SN at her convinence thanks

## 2013-08-17 ENCOUNTER — Other Ambulatory Visit: Payer: Self-pay | Admitting: Pulmonary Disease

## 2013-08-19 ENCOUNTER — Ambulatory Visit (INDEPENDENT_AMBULATORY_CARE_PROVIDER_SITE_OTHER)
Admission: RE | Admit: 2013-08-19 | Discharge: 2013-08-19 | Disposition: A | Discharge status: Home / Self Care | Payer: BC Managed Care – PPO | Source: Ambulatory Visit | Attending: Pulmonary Disease | Admitting: Pulmonary Disease

## 2013-08-19 ENCOUNTER — Ambulatory Visit (INDEPENDENT_AMBULATORY_CARE_PROVIDER_SITE_OTHER): Payer: BC Managed Care – PPO | Admitting: Pulmonary Disease

## 2013-08-19 ENCOUNTER — Encounter: Payer: Self-pay | Admitting: Pulmonary Disease

## 2013-08-19 VITALS — BP 122/70 | HR 83 | Temp 98.2°F | Ht 65.5 in | Wt 114.4 lb

## 2013-08-19 DIAGNOSIS — F988 Other specified behavioral and emotional disorders with onset usually occurring in childhood and adolescence: Secondary | ICD-10-CM

## 2013-08-19 DIAGNOSIS — D126 Benign neoplasm of colon, unspecified: Secondary | ICD-10-CM

## 2013-08-19 DIAGNOSIS — Z0000 Encounter for general adult medical examination without abnormal findings: Secondary | ICD-10-CM

## 2013-08-19 DIAGNOSIS — F17210 Nicotine dependence, cigarettes, uncomplicated: Secondary | ICD-10-CM

## 2013-08-19 DIAGNOSIS — F17200 Nicotine dependence, unspecified, uncomplicated: Secondary | ICD-10-CM

## 2013-08-19 DIAGNOSIS — J309 Allergic rhinitis, unspecified: Secondary | ICD-10-CM

## 2013-08-19 DIAGNOSIS — E7800 Pure hypercholesterolemia, unspecified: Secondary | ICD-10-CM

## 2013-08-19 DIAGNOSIS — M459 Ankylosing spondylitis of unspecified sites in spine: Secondary | ICD-10-CM

## 2013-08-19 MED ORDER — AMPHETAMINE-DEXTROAMPHET ER 20 MG PO CP24
ORAL_CAPSULE | ORAL | Status: DC
Start: 2013-08-19 — End: 2013-08-19

## 2013-08-19 MED ORDER — AMPHETAMINE-DEXTROAMPHET ER 20 MG PO CP24: ORAL_CAPSULE | ORAL | Status: DC

## 2013-08-19 NOTE — Patient Instructions (Signed)
Today we updated your med list in our EPIC system...    Continue your current medications the same...    We refilled your meds per request...  Today we did your follow up CXR & EKG... Please return to our lab at your concenience one morning for your FASTING blood work...    We will contact you w/ the results when available...   We will help you set up an appt w/ DrPlovsky as we discussed...  Call for any questions.Marland KitchenMarland Kitchen

## 2013-08-19 NOTE — Progress Notes (Signed)
Subjective:     Patient ID: Desiree Salinas, female   DOB: 1950/05/23, 63 y.o.   MRN: [id number]  HPI 63 y/o WF here for a follow up visit...   ~  Oct10:  she has had a good year overall and remains on ADDERALL XR 24m/d which we refill for her regularly, BMD 8/10 was normal, still smokes occas & CXR is clear, FLP on Simva40 was good in 2009 but not as good this yr & we discussed diet Rx (see below)... she notes some prob w/ staying asleep & this may be an issue w/ sleep hygiene- we discussed this & poss strategies to help, she will call if meds needed.  ~  May 11, 2010:  LKeyunaremains stable on meds as below, but still smoking ~1/2ppd & we discussed smoking cessation strategies, offered Chantix, etc;  CXR remains clear w/ min apical pleural thickening noted;  EKG w/ NSR, WNL;  Labs showed FLP not at goal w/ LDL 139 on the Simva40 & we discussed changing to Lipitor 471md to see if this works better for her (plus low chol/ low fat diet etc);  she sees DrGottsegen for GYN on Estradiol Rx;  she continues on the Adderall therapy which she feels she still benefits from & she requests to continue per our protocol...  ~  August 08, 2011:  1570moV & CPX> Desiree Salinas had a good year- Daugh had 2nd child, [place] she notes some crepitus in knees but still plays tennis & dances w/o pain etc; she's noted some insomnia- able to fall asleep OK but wakes up & unable to fall back to sleep; as noted she is still taking the Adderall for ADD & we discussed it's effect on sleep, don't take too late in the day etc; I'm reluctant to add sleeping meds or Benzo on top of this & she will try OTC meds like TylenolPM, Melatonin, etc...  We reviewed prob list, meds, xrays and labs> see below>>  OK TDAP today...  CXR 6/13 showed normal heart size, clear lungs, NAD...  LABS 6/13:  FLP- at goals on Lip40;  Chems- wnl;  CBC- wnl;  TSH=1.88;  VitD=17;  UA- ok...  ~  August 11, 2012:  Yearly ROV & CPX> Desiree Salinas been doing reasonably well but notes incr stress, worries, palpit, insomnia (daughter & grandchild in [place] She's been using an E-cig in order to cut back on cigarettes, and we discussed the need to step down the nicotine etc...  We reviewed the following medical problems during today's office visit >>     AR, AB, Smoker> not on breathing meds & we've encouraged smoking cessation efforts...    CHOL> on Lip40; FLP 6/13 showed TChol 148, TG 103, HDL 73, LDL 52... She will ret for f/u FLP.    GI- colon polyps> last colon 10/06 by DrKDemetra Shiner one 3mm70mperplastic polyp & f/u planned 43yr21yr   GYN- DrGottsegen on Estrace...    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    VitD defic> she had stopped her calcium, MVI, VitD 2000 & advised to re-start these supplements...    ADD> on Adderall20/d & she feels it really helps & that she needs to continue, refilled...    Dermatitis> she has a few ecchymoses, she checks w/ DrTaffeen yearly...  We reviewed  prob list, meds, xrays and labs> see below for updates >>   CXR 6/14 showed norm heart size, clear lungs, NAD...  LABS 6/14:  Pending...    ~  August 19, 2013:  Yearly [place] describes some difficulty w/ palpit (rapid reg), anxiety, & agoraphobia=> we discussed the need for psyche eval & will refer her to [facility]; she has lost 7# down to 114#w/ BMI<20 & asked to supplement her intake w/ Ensure, sustacal, etc;  She is still smoking but has cut back- remeinded of the need to quit completely & it is hopded that some additional meds from [facility] may help w/ smoking cessation as well... We reviewed the following medical problems during today's office visit >>     AR, AB, Smoker> not on breathing meds & we've encouraged smoking cessation efforts...    CHOL> on Lip40; FLP 6/15 showed TChol 150, TG 95, HDL 39, LDL 92...    GI- colon polyps> last colon  10/06 by Demetra Shiner w/ one 31m hyperplastic polyp & f/u planned 128yr..    GYN- DrGottsegen=> [place] on Estrace1m10mhis notes reviewed...    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    VitD defic> she has again stopped her calcium, MVI, VitD & advised to re-start these supplements; Labs 6/15 showed VitD= 40...    ADD/ anxiety/ depression> on Adderall20/d for adultADD& she feels it really helps & wants to continue; She describes being down in the dumps related to dauMadisongrandchildren, anxious and ?agoraphobic- we reviewed the need for Psyche help w/ these issues and to pick the best meds for her needs=> refer to DrPlovsky...    Dermatitis> she has a few ecchymoses, she checks w/ DrTaffeen yearly...  We reviewed prob list, meds, xrays and labs> see below for updates >> requests refill of Adderall- ok...  CXR 6/15 showed norm heart size, clear lungs, mild DJD shoulders, NAD... Marland KitchenMarland KitchenEKG 6/15 showed NSR, rate60, poor R grogression V1-3, otherw WNL 7 no change from 2010...  LABS 6/15:  FLP- at goals on Lip40;  Chems- wnl;  CBC- wnl;  TSH=1.64;  VitD=40...            Problem List:  ALLERGIC RHINITIS (ICD-477.9) - she uses Zyrtek & saline as needed...  ACUTE BRONCHITIS (ICD-466.0) - hx recurrent bronchitis in the past... CIGARETTE SMOKER >> prev ~1/2ppd but she hints that maybe it's less now; we discussed smoking cessation options but she is not interested. ~  CXR 9/10 showed clear, NAD... Marland Kitchen  CXR 10/10 showed clear, WNL... Marland Kitchen  CXR 3/12 remains clear w/ min apical pleural thickening noted. ~  CXR 6/13 showed normal heart size, clear lungs, NAD... Marland Kitchen  CXR 6/14 showed norm heart size, clear lungs, NAD... Marland Kitchen~  CXR 6/15 showed norm heart size, clear lungs, mild DJD shoulders, NAD... We reviewed smoking cessation strategies.  HYPERCHOLESTEROLEMIA (ICD-272.0) - on Simva40 since  2008, she was changed to ATORVASTATIN 108m88min 2012> ~  FLP 7/08 on Lip20 showed TChol 208, TG 90, HDL 64, LDL 122... changed to [place]~  FLP [place]9 on Simva40 showed TChol 173, TG 107, HDL 57, LDL 95... rec> same... ~  FLP 9/10 on Simva40 showed TChol 220, TG 179, HDL 58, LDL 138... rec> better diiet, same med for now. ~  FLP [place]1 on Simva40 showed TChol 201, TG  80, HDL 76, LDL 114... Continue same. ~  FLP 3/12 on Simva40 showed TChol 207, TG 204, HDL 57, LDL 139... Rec> ch to [id number]. ~  FLP 6/13 on Lip40 showed TChol TChol 146, TG 103, HDL 73, LDL 52... Continue same. ~  FLP 6/14 on Lip40 showed TChol 167, TG 132, HDL 64, LDL 76 ~  FLP 6/15 on Lip40 showed TChol 150, TG 95, HDL 39, LDL 92  COLONIC POLYPS (ICD-211.3) - colonoscopy 10/06 by DrKaplan showed 41m polyp= hyperplastic... follow up planned 113yr..  GYN> DrFontaine >> s/p TAH 1999 for leiomyoma; she has hot flashes and they discussed risk/ benefit/ options for HRT- on Estradiol1m35m. ~  She is up to date on Mammograms, PAPs, BMD screening per DrFontaine=> his notes reviewed...   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 she is active, exerc regularly, no on-going ortho complaints... ~  3/12:  she notes left thumb tenosynovitis w/ injection & splint rx per DrSypher; she indicates that she requires periodic injections for this problem. ~  6/13:  she notes some crepitus in knees but no pain & continues to play tennis & dance regularly... ~  She had Ortho, hand eval by DrSypher  VITAMIN D DEFICIENCY >> rec to take ~2000u Vit D supplement OTC daily... ~  Labs 10/10 showed Vit D level = 40... Continue OTC supplement ~1000u daily... ~  Labs 6/13 showed Vit D level = 17... rec to incr Vit D supplement to ~2000u daily... ~  BMD [facility] (DrFontaine) showed TScores -1.0 in Spine, and -0.7 in hips...  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  ~  6/14:  she wishes to continue this med and Rx written per our protocol... ~  6/15: on Adderall20/d for adultADD& she feels it really helps & wants to continue; She describes being down in the dumps related to daugFisherrandchildren, anxious and ?agoraphobic- we reviewed the need for Psyche help w/ these issues and to pick the best meds for her needs=> refer to DrPlovsky...   SHINGLES, HX OF (ICD-V13.8) > she had a mild outbreak in 2011 in left V1 distrib & was treated by DrTaffeen; we discussed the Shingles vaccine & wrote Rx 3/12==> [facility]  DERMATITIS (ICD-692.9) - followed by DrTafeen...  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 she had a BMD here 8/08 w/ normal TScores... repeat BMD here 8/10 showed TScores +0.2 in Spine, and -0.6 in right FemNeck... Vit D level here 6/13 = 17, rec supplement orally... continue Calcium, Vits, Vit D 2000/d...   Past Surgical History  Procedure Laterality Date  . Abdominal surgery  1999    Abdominoplasty  . Abdominal hysterectomy  1999    TAH  . Tonsillectomy and adenoidectomy    . Augmentation mammaplasty      Implants  . Tubal ligation      Outpatient Encounter Prescriptions as of 08/19/2013  Medication Sig  . amphetamine-dextroamphetamine (ADDERALL XR) 20 MG 24 hr capsule Take 1 tablet by mouth once a day...  DO NOT FILL BEFORE 07/12/2013  . atorvastatin (LIPITOR) 40 MG tablet TAKE 1 BY MOUTH DAILY  . estradiol (ESTRACE) 1 MG tablet Take 1 tablet (1 mg total) by mouth daily.  .Marland Kitchen  Melatonin 3 MG TABS Take 1 tablet by mouth at bedtime.  Marland Kitchen KRILL OIL PO Take by mouth.  . Multiple Vitamin (MULTIVITAMIN) tablet Take 1 tablet by mouth daily.  . [DISCONTINUED] Calcium Carbonate-Vitamin D (CALCIUM + D PO) Take by mouth daily.   .  [DISCONTINUED] cetirizine (ZYRTEC) 10 MG tablet Take 10 mg by mouth daily as needed for allergies.  . [DISCONTINUED] Cholecalciferol (VITAMIN D PO) Take by mouth.  . [DISCONTINUED] Diphenhydramine-APAP, sleep, (TYLENOL PM EXTRA STRENGTH PO) Take by mouth as needed.     Allergies  Allergen Reactions  . Eucerin [Basis Facial Moisturizer]     eucerin lotion causes an allergic skin reaction  . Latex   . Nickel     Current Medications, Allergies, Past Medical History, Past Surgical History, Family History, and Social History were reviewed in [employer] record.   Review of Systems        The patient complains of joint pain and stiffness.  The patient denies fever, chills, sweats, anorexia, fatigue, weakness, malaise, weight loss, sleep disorder, blurring, diplopia, eye irritation, eye discharge, vision loss, eye pain, photophobia, earache, ear discharge, tinnitus, decreased hearing, nasal congestion, nosebleeds, sore throat, hoarseness, chest pain, palpitations, syncope, dyspnea on exertion, orthopnea, PND, peripheral edema, cough, dyspnea at rest, excessive sputum, hemoptysis, wheezing, pleurisy, nausea, vomiting, diarrhea, constipation, change in bowel habits, abdominal pain, melena, hematochezia, jaundice, gas/bloating, indigestion/heartburn, dysphagia, odynophagia, dysuria, hematuria, urinary frequency, urinary hesitancy, nocturia, incontinence, back pain, joint swelling, muscle cramps, muscle weakness, arthritis, sciatica, restless legs, leg pain at night, leg pain with exertion, rash, itching, dryness, suspicious lesions, paralysis, paresthesias, seizures, tremors, vertigo, transient blindness, frequent falls, frequent headaches, difficulty walking, depression, anxiety, memory loss, confusion, cold intolerance, heat intolerance, polydipsia, polyphagia, polyuria, unusual weight change, abnormal bruising, bleeding, enlarged lymph nodes, urticaria, allergic rash, hay fever, and  recurrent infections.     Objective:   Physical Exam     WD, WN, 64 y/o WF in NAD... GENERAL:  Alert & oriented; pleasant & cooperative... HEENT:  [place]/AT, EOM-wnl, PERRLA, EACs-clear, TMs-wnl, NOSE-clear, THROAT-clear & wnl. NECK:  Supple w/ fairROM; no JVD; normal carotid impulses w/o bruits; no thyromegaly or nodules palpated; no lymphadenopathy. CHEST:  Clear to P & A; without wheezes/ rales/ or rhonchi. HEART:  Regular Rhythm; without murmurs/ rubs/ or gallops. ABDOMEN:  Soft & nontender; normal bowel sounds; no organomegaly or masses detected. EXT: without deformities, mild arthritic changes; no varicose veins/ venous insuffic/ or edema. NEURO:  CN's intact; motor testing normal; sensory testing normal; gait normal & balance OK. DERM:  No lesions noted; no rash etc...  RADIOLOGY DATA:  Reviewed in the EPIC EMR & discussed w/ the patient...  LABORATORY DATA:  Reviewed in the EPIC EMR & discussed w/ the patient...   Assessment:      CPX>>  We gave her the TDAP 6/13  AR/ Smoker/ Hx Bronchits>  We discussed smoking cessation options but she is not interested in med rx; f/u CXR remains clear & she is asymptomatic x min cough; remains active w/ tennis & dance...  CHOL>  Controlled on low chol/ low fat diet + ATORVA40 & FLP is improved...  Hx Colon Polyps>  She had 28m polyp removed 2006 by DrKaplan & it was hyperplastic w/ f/u suggested in 11yr clinically stable, no problems, no blood seen, etc...  AS/ HLA-B27 pos>  Father has AS, LaKarelys HLA-B27 pos but w/o other changes & continues to do well...  Vitamin D Defic>  Vit  D level is 40... rec contin OTC supplement to at least 2000u daily...  ADD>  She remains on ADDERALL 84m/d which we fill for her [place]; she continues to feel benefit from this med & wants to continue as is... ~  6/15: she is c/o depression & anxiety (?agoraphobia) related to her daughter & grandchilden; we discussed need to see  Psyche, DrPlovsky for counseling & med rx options...  Hx Shingles/ Dermatitis>  [facility] she had the Shingles vaccine in 2012...     Plan:     Patient's Medications  New Prescriptions   No medications on file  Previous Medications   ATORVASTATIN (LIPITOR) 40 MG TABLET    TAKE 1 BY MOUTH DAILY   ESTRADIOL (ESTRACE) 1 MG TABLET    Take 1 tablet (1 mg total) by mouth daily.   KRILL OIL PO    Take by mouth.   MELATONIN 3 MG TABS    Take 1 tablet by mouth at bedtime.   MULTIPLE VITAMIN (MULTIVITAMIN) TABLET    Take 1 tablet by mouth daily.  Modified Medications   Modified Medication Previous Medication   AMPHETAMINE-DEXTROAMPHETAMINE (ADDERALL XR) 20 MG 24 HR CAPSULE amphetamine-dextroamphetamine (ADDERALL XR) 20 MG 24 hr capsule      Take 1 tablet by mouth once a day...DO NOT FILL BEFORE 10/18/2013    Take 1 tablet by mouth once a day...  DO NOT FILL BEFORE 07/12/2013  Discontinued Medications   CALCIUM CARBONATE-VITAMIN D (CALCIUM + D PO)    Take by mouth daily.    CETIRIZINE (ZYRTEC) 10 MG TABLET    Take 10 mg by mouth daily as needed for allergies.   CHOLECALCIFEROL (VITAMIN D PO)    Take by mouth.   DIPHENHYDRAMINE-APAP, SLEEP, (TYLENOL PM EXTRA STRENGTH PO)    Take by mouth as needed.

## 2013-08-24 ENCOUNTER — Other Ambulatory Visit (INDEPENDENT_AMBULATORY_CARE_PROVIDER_SITE_OTHER): Payer: BC Managed Care – PPO

## 2013-08-24 DIAGNOSIS — Z0000 Encounter for general adult medical examination without abnormal findings: Secondary | ICD-10-CM

## 2013-08-24 LAB — CBC WITH DIFFERENTIAL/PLATELET
BASOS PCT: 1 % (ref 0.0–3.0)
Basophils Absolute: 0.1 10*3/uL (ref 0.0–0.1)
EOS PCT: 2.4 % (ref 0.0–5.0)
Eosinophils Absolute: 0.3 10*3/uL (ref 0.0–0.7)
HCT: 50.9 % — ABNORMAL HIGH (ref 36.0–46.0)
HEMOGLOBIN: 17 g/dL — AB (ref 12.0–15.0)
Lymphocytes Relative: 30.6 % (ref 12.0–46.0)
Lymphs Abs: 3.3 10*3/uL (ref 0.7–4.0)
MCHC: 33.3 g/dL (ref 30.0–36.0)
MCV: 96.8 fl (ref 78.0–100.0)
MONO ABS: 0.7 10*3/uL (ref 0.1–1.0)
Monocytes Relative: 6.8 % (ref 3.0–12.0)
NEUTROS PCT: 59.2 % (ref 43.0–77.0)
Neutro Abs: 6.3 10*3/uL (ref 1.4–7.7)
Platelets: 306 10*3/uL (ref 150.0–400.0)
RBC: 5.26 Mil/uL — AB (ref 3.87–5.11)
RDW: 13.2 % (ref 11.5–15.5)
WBC: 10.6 10*3/uL — AB (ref 4.0–10.5)

## 2013-08-24 LAB — HEPATIC FUNCTION PANEL
ALK PHOS: 53 U/L (ref 39–117)
ALT: 16 U/L (ref 0–35)
AST: 15 U/L (ref 0–37)
Albumin: 4.2 g/dL (ref 3.5–5.2)
BILIRUBIN DIRECT: 0.1 mg/dL (ref 0.0–0.3)
TOTAL PROTEIN: 6.7 g/dL (ref 6.0–8.3)
Total Bilirubin: 0.8 mg/dL (ref 0.2–1.2)

## 2013-08-24 LAB — LIPID PANEL
CHOL/HDL RATIO: 4
Cholesterol: 150 mg/dL (ref 0–200)
HDL: 38.9 mg/dL — ABNORMAL LOW (ref >39.00)
LDL Cholesterol: 92 mg/dL (ref 0–99)
NONHDL: 111.1
TRIGLYCERIDES: 95 mg/dL (ref 0.0–149.0)
VLDL: 19 mg/dL (ref 0.0–40.0)

## 2013-08-24 LAB — BASIC METABOLIC PANEL
BUN: 14 mg/dL (ref 6–23)
CHLORIDE: 107 meq/L (ref 96–112)
CO2: 30 mEq/L (ref 19–32)
CREATININE: 0.7 mg/dL (ref 0.4–1.2)
Calcium: 9.2 mg/dL (ref 8.4–10.5)
GFR: 91.6 mL/min (ref >60.00)
Glucose, Bld: 92 mg/dL (ref 70–99)
POTASSIUM: 4.4 meq/L (ref 3.5–5.1)
SODIUM: 142 meq/L (ref 135–145)

## 2013-08-24 LAB — TSH: TSH: 1.64 u[IU]/mL (ref 0.35–4.50)

## 2013-08-24 LAB — VITAMIN D 25 HYDROXY (VIT D DEFICIENCY, FRACTURES): VITD: 40.01 ng/mL

## 2013-10-01 ENCOUNTER — Telehealth: Payer: Self-pay | Admitting: Pulmonary Disease

## 2013-10-01 NOTE — Telephone Encounter (Signed)
Rec'd [facility] and [place] PA forward 5 pages to [place]

## 2013-10-05 ENCOUNTER — Telehealth: Payer: Self-pay | Admitting: Gastroenterology

## 2013-10-05 ENCOUNTER — Telehealth: Payer: Self-pay | Admitting: Pulmonary Disease

## 2013-10-05 ENCOUNTER — Encounter: Payer: Self-pay | Admitting: Gastroenterology

## 2013-10-05 DIAGNOSIS — R197 Diarrhea, unspecified: Secondary | ICD-10-CM

## 2013-10-05 NOTE — Telephone Encounter (Signed)
Called spoke with pt. She c/o diarrhea x couple weeks. Pt reports it is getting better. She takes imodium PRN and it helps. She wants to know if she needs to see Dr. Deatra Ina or if SN can do stool sample. Please advise thanks  Allergies  Allergen Reactions  . Eucerin [Basis Facial Moisturizer]     eucerin lotion causes an allergic skin reaction  . Latex   . Nickel

## 2013-10-05 NOTE — Telephone Encounter (Signed)
Referral placed. Pt aware. Nothing further needed. 

## 2013-10-05 NOTE — Telephone Encounter (Signed)
Pts appt moved to 10/08/13@3pm  with Alonza Bogus PA. Left message for Suanne Marker to call back. Rhonda aware and will notify pt.

## 2013-10-05 NOTE — Telephone Encounter (Signed)
Per SN---  See if she can get into see Dr. Deatra Ina or PA ASAP.  Thanks.

## 2013-10-08 ENCOUNTER — Ambulatory Visit: Payer: BC Managed Care – PPO | Admitting: Gastroenterology

## 2013-10-09 ENCOUNTER — Encounter: Payer: Self-pay | Admitting: *Deleted

## 2013-10-14 ENCOUNTER — Other Ambulatory Visit: Payer: BC Managed Care – PPO

## 2013-10-14 ENCOUNTER — Encounter: Payer: Self-pay | Admitting: Gastroenterology

## 2013-10-14 ENCOUNTER — Ambulatory Visit (INDEPENDENT_AMBULATORY_CARE_PROVIDER_SITE_OTHER): Payer: BC Managed Care – PPO | Admitting: Gastroenterology

## 2013-10-14 ENCOUNTER — Ambulatory Visit: Payer: BC Managed Care – PPO | Admitting: Gastroenterology

## 2013-10-14 VITALS — BP 110/58 | HR 66 | Ht 65.5 in | Wt 113.0 lb

## 2013-10-14 DIAGNOSIS — R197 Diarrhea, unspecified: Secondary | ICD-10-CM

## 2013-10-14 NOTE — Progress Notes (Signed)
10/14/2013 Desiree Salinas [id number] 04-Mar-1951   HISTORY OF PRESENT ILLNESS:  This is a pleasant 63 year old female who is previously known to Dr. Deatra Ina for colonoscopy in 12/2004.  She had one hyperplastic colon polyp at that time and repeat was recommended in 10 years.  She presents to our office today with complaints of diarrhea for the past 6 weeks.  Sudden onset of diarrhea starting around 25 times per day.  Has decreased, but still have 10-12 BM's per day.  Having nocturnal BM's and incontinence at times.  No blood in stool.  Had some right sided abdominal pain initially but now not having any pain.  Does not feel sick.  Eating well "like a pig" but did lose 10 pounds over the past 6 weeks.  Says that she put a few of those pounds back on, however.  No recent travel, sick contacts to her knowledge, or antibiotics.  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    Past Medical History  Diagnosis Date  . VAIN I (vaginal intraepithelial neoplasia grade I) 2006    Positive high risk HPV  . Elevated cholesterol   . Shingles   . STD (sexually transmitted disease)     Condylomata  . ADD (attention deficit disorder)   . Colon polyps     HYPERPLASTIC POLYP   Past Surgical History  Procedure Laterality Date  . Abdominal surgery  1999    Abdominoplasty  . Abdominal hysterectomy  1999    TAH  . Tonsillectomy and adenoidectomy    . Augmentation mammaplasty      Implants  . Tubal ligation    . Colonoscopy  2006    reports that she has been smoking Cigarettes.  She has been smoking about 0.00 packs per day. She does not have any smokeless tobacco history on file. She reports that she drinks about one ounce of alcohol per week. She reports that she uses illicit drugs (Marijuana). family history includes Aneurysm in her maternal aunt, maternal grandfather, and maternal grandmother; Breast cancer in her mother;  Diabetes in her maternal grandmother; Heart disease in her father. Allergies  Allergen Reactions  . Eucerin [Basis Facial Moisturizer]     eucerin lotion causes an allergic skin reaction  . Latex   . Nickel       Outpatient Encounter Prescriptions as of 10/14/2013  Medication Sig  . amphetamine-dextroamphetamine (ADDERALL XR) 20 MG 24 hr capsule Take 1 tablet by mouth once a day...DO NOT FILL BEFORE 10/18/2013  . atorvastatin (LIPITOR) 40 MG tablet TAKE 1 BY MOUTH DAILY  . busPIRone (BUSPAR) 10 MG tablet Take 10 mg by mouth. Take 2 tablets by mouth every morning and one tablet at bedtime  . Calcium Carbonate-Vitamin D (CALTRATE 600+D PO) Take 1 capsule by mouth daily.  Marland Kitchen estradiol (ESTRACE) 1 MG tablet Take 1 tablet (1 mg total) by mouth daily.  . Melatonin 5 MG TABS Take 1 tablet by mouth daily.  . Multiple Vitamin (MULTIVITAMIN) tablet Take 1 tablet by mouth daily.  Marland Kitchen KRILL OIL PO Take by mouth.  . [DISCONTINUED] Melatonin 3 MG TABS Take 1 tablet by mouth at bedtime.     REVIEW OF SYSTEMS  : All other systems reviewed and negative except where noted in the History of Present Illness.   PHYSICAL EXAM: BP 110/58  Pulse 66  Ht 5' 5.5" (1.664 m)  Wt 113 lb (51.256 kg)  BMI 18.51 kg/m2 General: Well developed white female in no acute distress Head: Normocephalic and atraumatic Eyes:  Sclerae anicteric, conjunctiva pink. Ears: Normal auditory acuity  Lungs: Clear throughout to auscultation Heart: Regular rate and rhythm Abdomen: Soft, non-distended.  Normal bowel sounds.  Non-tender. Musculoskeletal: Symmetrical with no gross deformities  Skin: No lesions on visible extremities Extremities: No edema  Neurological: Alert oriented x 4, grossly non-focal Psychological:  Alert and cooperative. Normal mood and affect  ASSESSMENT AND PLAN: -Diarrhea x 6 weeks:  Sounds infectious but prolonged course.  Has history of Giardia several years ago, but no recent travel and other risk  factors currently.  Will check stool GI pathogen panel.  If positive then will treat appropriately.  If negative then will still try an empiric course of flagyl.  If no improvement on antibiotics then need to consider repeat colonoscopy.

## 2013-10-14 NOTE — Patient Instructions (Signed)
Your physician has requested that you go to the basement for the following lab work before leaving today: Stool Pathogen Panel

## 2013-10-15 LAB — GASTROINTESTINAL PATHOGEN PANEL PCR
C. difficile Tox A/B, PCR: NEGATIVE
Campylobacter, PCR: NEGATIVE
Cryptosporidium, PCR: NEGATIVE
E COLI (STEC) STX1/STX2, PCR: NEGATIVE
E coli (ETEC) LT/ST PCR: NEGATIVE
E coli 0157, PCR: NEGATIVE
GIARDIA LAMBLIA, PCR: NEGATIVE
NOROVIRUS, PCR: NEGATIVE
ROTAVIRUS, PCR: NEGATIVE
SALMONELLA, PCR: NEGATIVE
SHIGELLA, PCR: NEGATIVE

## 2013-10-15 NOTE — Progress Notes (Signed)
Reviewed and agree with management. Robert D. Kaplan, M.D., FACG  

## 2013-10-16 ENCOUNTER — Other Ambulatory Visit: Payer: Self-pay

## 2013-10-16 MED ORDER — METRONIDAZOLE 500 MG PO TABS: 500.0000 mg | ORAL_TABLET | Freq: Two times a day (BID) | ORAL | Status: DC

## 2013-10-19 ENCOUNTER — Telehealth: Payer: Self-pay | Admitting: Gastroenterology

## 2013-10-19 NOTE — Telephone Encounter (Signed)
A call to check on patient's progress.  She has not yet started Flagyl but diarrhea has continued to improve.  She's having one loose stools daily.  Stool pathogen panel was negative.  I advised Desiree Salinas to take a course of Flagyl and to report back if diarrhea does not entirely subside.

## 2013-12-17 ENCOUNTER — Telehealth: Payer: Self-pay | Admitting: Gastroenterology

## 2013-12-18 NOTE — Telephone Encounter (Signed)
Spoke with the patient. She took her 2 week course of flagyl. She reports the diarrhea may have slowed a little during that time but it did not stop. She takes daily probiotics but cannot tell that they make any difference. She recently fell and injured her shoulder. She is noe on Voltaren. She is back to having 10 to 12 stools daily, some very loose to semi formed. Some degree of urgency when she has to go. No incontinence. She is going to use Imodium as directed. Please advise, should I schedule the colonoscopy? See last office note.

## 2013-12-21 NOTE — Telephone Encounter (Signed)
I have left message for the patient to call back 

## 2013-12-21 NOTE — Telephone Encounter (Signed)
Patient is changing jobs and needs to wait on her new insurance or go with COBRA. She will call back.

## 2013-12-21 NOTE — Telephone Encounter (Signed)
Ok

## 2013-12-21 NOTE — Telephone Encounter (Signed)
FYI-she has not had diarrhea since she took a dose of Imodium on Friday. She is still on Volteran. She will let me know if she does not continue to improve. For the record, she has been off Adderall for "a long time" and she started Buspar after the diarrhea started. But she says no diarrhea now.

## 2013-12-21 NOTE — Telephone Encounter (Signed)
Check to see how long she has been on BuSpar and Adderall.  If she was on these medicines long before the diarrhea started and she should be scheduled for colonoscopy

## 2013-12-30 ENCOUNTER — Encounter: Payer: Self-pay | Admitting: Gynecology

## 2013-12-30 ENCOUNTER — Ambulatory Visit (INDEPENDENT_AMBULATORY_CARE_PROVIDER_SITE_OTHER): Payer: BC Managed Care – PPO | Admitting: Gynecology

## 2013-12-30 VITALS — BP 120/76 | Ht 65.0 in | Wt 116.0 lb

## 2013-12-30 DIAGNOSIS — N952 Postmenopausal atrophic vaginitis: Secondary | ICD-10-CM

## 2013-12-30 DIAGNOSIS — Z79890 Hormone replacement therapy (postmenopausal): Secondary | ICD-10-CM

## 2013-12-30 DIAGNOSIS — Z01419 Encounter for gynecological examination (general) (routine) without abnormal findings: Secondary | ICD-10-CM

## 2013-12-30 DIAGNOSIS — Z780 Asymptomatic menopausal state: Secondary | ICD-10-CM

## 2013-12-30 DIAGNOSIS — Z23 Encounter for immunization: Secondary | ICD-10-CM

## 2013-12-30 MED ORDER — ESTRADIOL 1 MG PO TABS: 1.0000 mg | ORAL_TABLET | Freq: Every day | ORAL | Status: DC

## 2013-12-30 NOTE — Progress Notes (Signed)
Desiree Salinas 1950-03-18 [id number]        63 y.o.  [id number] for annual exam.  Several issues noted below.  Past medical history,surgical history, problem list, medications, allergies, family history and social history were all reviewed and documented as reviewed in the EPIC chart.  ROS:  12 system ROS performed with pertinent positives and negatives included in the history, assessment and plan.   Additional significant findings :  none   Exam: Kim Counsellor Vitals:   12/30/13 1025  BP: 120/76  Height: 5\' 5"  (1.651 m)  Weight: 116 lb (52.617 kg)   General appearance:  Normal affect, orientation and appearance. Skin: Grossly normal HEENT: Without gross lesions.  No cervical or supraclavicular adenopathy. Thyroid normal.  Lungs:  Clear without wheezing, rales or rhonchi Cardiac: RR, without RMG Abdominal:  Soft, nontender, without masses, guarding, rebound, organomegaly or hernia Breasts:  Examined lying and sitting without masses, retractions, discharge or axillary adenopathy.  Bilateral implants noted Pelvic:  Ext/BUS/vagina with generalized atrophic changes  Adnexa  Without masses or tenderness    Anus and perineum  Normal   Rectovaginal  Normal sphincter tone without palpated masses or tenderness.    Assessment/Plan:  63 y.o. [name] for annual exam.   1. Post menopausal/atrophic genital changes/HRT.  Continues on estradiol 1 mg daily.  Not having significant hot flushes, night sweats, vaginal dryness or dyspareunia. Status post hysterectomy for leiomyoma.  I again reviewed the whole issue of HRT with her to include the WHI study with increased risk of stroke, heart attack, DVT and breast cancer. The ACOG and NAMS statements for lowest dose for the shortest period of time reviewed. Transdermal versus oral first-pass effect benefit discussed.  Timing issue also reviewed as far as possible benefit if started early in the menopause. Patient understands and accepts the  risks and wants to continue. I did ask her to break the tablets in half and try 0.5 mg estradiol to see if she tolerates this and if so stay on this dose this coming year. If not then go back to the 1 mg. Refill 1 year provided. 2. Pap smear/HPV negative 2013. No Pap smear done today. 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 Follow up Pap smears negative 10.  Plan repeat Pap smear next year at 3 year interval. 3. DEXA 2014 normal. Plan repeat DEXA at 5 years. Increase calcium vitamin D reviewed. 4. Mammography 01/2013. Repeat mammogram this coming November. SBE monthly reviewed. 5. Colonoscopy 2006. Patient is due to repeat this coming up and she knows to call to arrange. 6. Stop smoking. 7. Health maintenance. No routine lab work done as this is done at her primary physician's office. Follow up 1 year, sooner as needed.     Anastasio Auerbach MD, 10:55 AM 12/30/2013

## 2013-12-30 NOTE — Addendum Note (Signed)
Addended by: Nelva Nay on: 12/30/2013 11:28 AM   Modules accepted: Orders

## 2013-12-30 NOTE — Patient Instructions (Signed)
You may obtain a copy of any labs that were done today by logging onto MyChart as outlined in the instructions provided with your AVS (after visit summary). The office will not call with normal lab results but certainly if there are any significant abnormalities then we will contact you.   Health Maintenance, Female A healthy lifestyle and preventative care can promote health and wellness.  Maintain regular health, dental, and eye exams.  Eat a healthy diet. Foods like vegetables, fruits, whole grains, low-fat dairy products, and lean protein foods contain the nutrients you need without too many calories. Decrease your intake of foods high in solid fats, added sugars, and salt. Get information about a proper diet from your caregiver, if necessary.  Regular physical exercise is one of the most important things you can do for your health. Most adults should get at least 150 minutes of moderate-intensity exercise (any activity that increases your heart rate and causes you to sweat) each week. In addition, most adults need muscle-strengthening exercises on 2 or more days a week.   Maintain a healthy weight. The body mass index (BMI) is a screening tool to identify possible weight problems. It provides an estimate of body fat based on height and weight. Your caregiver can help determine your BMI, and can help you achieve or maintain a healthy weight. For adults 20 years and older:  A BMI below 18.5 is considered underweight.  A BMI of 18.5 to 24.9 is normal.  A BMI of 25 to 29.9 is considered overweight.  A BMI of 30 and above is considered obese.  Maintain normal blood lipids and cholesterol by exercising and minimizing your intake of saturated fat. Eat a balanced diet with plenty of fruits and vegetables. Blood tests for lipids and cholesterol should begin at age 61 and be repeated every 5 years. If your lipid or cholesterol levels are high, you are over 50, or you are a high risk for heart  disease, you may need your cholesterol levels checked more frequently.Ongoing high lipid and cholesterol levels should be treated with medicines if diet and exercise are not effective.  If you smoke, find out from your caregiver how to quit. If you do not use tobacco, do not start.  Lung cancer screening is recommended for adults aged 33 80 years who are at high risk for developing lung cancer because of a history of smoking. Yearly low-dose computed tomography (CT) is recommended for people who have at least a 30-pack-year history of smoking and are a current smoker or have quit within the past 15 years. A pack year of smoking is smoking an average of 1 pack of cigarettes a day for 1 year (for example: 1 pack a day for 30 years or 2 packs a day for 15 years). Yearly screening should continue until the smoker has stopped smoking for at least 15 years. Yearly screening should also be stopped for people who develop a health problem that would prevent them from having lung cancer treatment.  If you are pregnant, do not drink alcohol. If you are breastfeeding, be very cautious about drinking alcohol. If you are not pregnant and choose to drink alcohol, do not exceed 1 drink per day. One drink is considered to be 12 ounces (355 mL) of beer, 5 ounces (148 mL) of wine, or 1.5 ounces (44 mL) of liquor.  Avoid use of street drugs. Do not share needles with anyone. Ask for help if you need support or instructions about stopping  the use of drugs.  High blood pressure causes heart disease and increases the risk of stroke. Blood pressure should be checked at least every 1 to 2 years. Ongoing high blood pressure should be treated with medicines, if weight loss and exercise are not effective.  If you are 59 to 62 years old, ask your caregiver if you should take aspirin to prevent strokes.  Diabetes screening involves taking a blood sample to check your fasting blood sugar level. This should be done once every 3  years, after age 91, if you are within normal weight and without risk factors for diabetes. Testing should be considered at a younger age or be carried out more frequently if you are overweight and have at least 1 risk factor for diabetes.  Breast cancer screening is essential preventative care for women. You should practice "breast self-awareness." This means understanding the normal appearance and feel of your breasts and may include breast self-examination. Any changes detected, no matter how small, should be reported to a caregiver. Women in their 66s and 30s should have a clinical breast exam (CBE) by a caregiver as part of a regular health exam every 1 to 3 years. After age 101, women should have a CBE every year. Starting at age 100, women should consider having a mammogram (breast X-ray) every year. Women who have a family history of breast cancer should talk to their caregiver about genetic screening. Women at a high risk of breast cancer should talk to their caregiver about having an MRI and a mammogram every year.  Breast cancer gene (BRCA)-related cancer risk assessment is recommended for women who have family members with BRCA-related cancers. BRCA-related cancers include breast, ovarian, tubal, and peritoneal cancers. Having family members with these cancers may be associated with an increased risk for harmful changes (mutations) in the breast cancer genes BRCA1 and BRCA2. Results of the assessment will determine the need for genetic counseling and BRCA1 and BRCA2 testing.  The Pap test is a screening test for cervical cancer. Women should have a Pap test starting at age 57. Between ages 25 and 35, Pap tests should be repeated every 2 years. Beginning at age 37, you should have a Pap test every 3 years as long as the past 3 Pap tests have been normal. If you had a hysterectomy for a problem that was not cancer or a condition that could lead to cancer, then you no longer need Pap tests. If you are  between ages 50 and 76, and you have had normal Pap tests going back 10 years, you no longer need Pap tests. If you have had past treatment for cervical cancer or a condition that could lead to cancer, you need Pap tests and screening for cancer for at least 20 years after your treatment. If Pap tests have been discontinued, risk factors (such as a new sexual partner) need to be reassessed to determine if screening should be resumed. Some women have medical problems that increase the chance of getting cervical cancer. In these cases, your caregiver may recommend more frequent screening and Pap tests.  The human papillomavirus (HPV) test is an additional test that may be used for cervical cancer screening. The HPV test looks for the virus that can cause the cell changes on the cervix. The cells collected during the Pap test can be tested for HPV. The HPV test could be used to screen women aged 44 years and older, and should be used in women of any age  who have unclear Pap test results. After the age of 55, women should have HPV testing at the same frequency as a Pap test.  Colorectal cancer can be detected and often prevented. Most routine colorectal cancer screening begins at the age of 44 and continues through age 20. However, your caregiver may recommend screening at an earlier age if you have risk factors for colon cancer. On a yearly basis, your caregiver may provide home test kits to check for hidden blood in the stool. Use of a small camera at the end of a tube, to directly examine the colon (sigmoidoscopy or colonoscopy), can detect the earliest forms of colorectal cancer. Talk to your caregiver about this at age 86, when routine screening begins. Direct examination of the colon should be repeated every 5 to 10 years through age 13, unless early forms of pre-cancerous polyps or small growths are found.  Hepatitis C blood testing is recommended for all people born from [birth year] through 1964 and any  individual with known risks for hepatitis C.  Practice safe sex. Use condoms and avoid high-risk sexual practices to reduce the spread of sexually transmitted infections (STIs). Sexually active women aged 36 and younger should be checked for Chlamydia, which is a common sexually transmitted infection. Older women with new or multiple partners should also be tested for Chlamydia. Testing for other STIs is recommended if you are sexually active and at increased risk.  Osteoporosis is a disease in which the bones lose minerals and strength with aging. This can result in serious bone fractures. The risk of osteoporosis can be identified using a bone density scan. Women ages 20 and over and women at risk for fractures or osteoporosis should discuss screening with their caregivers. Ask your caregiver whether you should be taking a calcium supplement or vitamin D to reduce the rate of osteoporosis.  Menopause can be associated with physical symptoms and risks. Hormone replacement therapy is available to decrease symptoms and risks. You should talk to your caregiver about whether hormone replacement therapy is right for you.  Use sunscreen. Apply sunscreen liberally and repeatedly throughout the day. You should seek shade when your shadow is shorter than you. Protect yourself by wearing long sleeves, pants, a wide-brimmed hat, and sunglasses year round, whenever you are outdoors.  Notify your caregiver of new moles or changes in moles, especially if there is a change in shape or color. Also notify your caregiver if a mole is larger than the size of a pencil eraser.  Stay current with your immunizations. Document Released: 09/04/2010 Document Revised: 06/16/2012 Document Reviewed: 09/04/2010 [facility] Patient Information 2014 [employer].

## 2013-12-31 LAB — URINALYSIS W MICROSCOPIC + REFLEX CULTURE
BACTERIA UA: NONE SEEN
BILIRUBIN URINE: NEGATIVE
Casts: NONE SEEN
Crystals: NONE SEEN
GLUCOSE, UA: NEGATIVE mg/dL
HGB URINE DIPSTICK: NEGATIVE
KETONES UR: NEGATIVE mg/dL
Leukocytes, UA: NEGATIVE
Nitrite: NEGATIVE
PROTEIN: NEGATIVE mg/dL
Specific Gravity, Urine: 1.006 (ref 1.005–1.030)
Squamous Epithelial / LPF: NONE SEEN
UROBILINOGEN UA: 0.2 mg/dL (ref 0.0–1.0)
pH: 6.5 (ref 5.0–8.0)

## 2014-01-04 ENCOUNTER — Encounter: Payer: Self-pay | Admitting: Gynecology

## 2014-01-22 ENCOUNTER — Encounter: Payer: Self-pay | Admitting: Gynecology

## 2014-02-09 ENCOUNTER — Telehealth: Payer: Self-pay | Admitting: Pulmonary Disease

## 2014-02-09 NOTE — Telephone Encounter (Signed)
Per Leigh, okay to refill. Remind patient to set up appt in June 2016 for yearly.   LMTCB---confirm pharmacy

## 2014-02-10 MED ORDER — ATORVASTATIN CALCIUM 40 MG PO TABS: ORAL_TABLET | ORAL | Status: DC

## 2014-02-10 NOTE — Telephone Encounter (Signed)
Pt aware of refill and states she is actually looking for new PCP as she is aware that SN is seeing pulmonary now. Pt was given Dr Donneta Romberg name in Primary Care downstairs to contact.  Nothing more needed at this time.

## 2014-05-12 ENCOUNTER — Telehealth: Payer: Self-pay | Admitting: *Deleted

## 2014-05-12 NOTE — Telephone Encounter (Signed)
Pt called requesting refill on Liptor 40 mg tablet she is out medication, her PCP has retired. I saw a telephone encounter where Dr.Nadel gave pt 90 day supply back in dec. 2015 and was "looking for a PCP" then. I explained to pt unsure if you would approve, but I would ask. Please advise

## 2014-05-12 NOTE — Telephone Encounter (Signed)
Okay for 1 month refill. She definitely though needs to find a primary M.D. To follow.

## 2014-05-13 MED ORDER — ATORVASTATIN CALCIUM 40 MG PO TABS: ORAL_TABLET | ORAL | Status: DC

## 2014-05-13 NOTE — Telephone Encounter (Signed)
Pt informed, rx sent 

## 2014-06-16 ENCOUNTER — Other Ambulatory Visit: Payer: Self-pay | Admitting: Gynecology

## 2014-06-16 ENCOUNTER — Other Ambulatory Visit: Payer: Self-pay | Admitting: Pulmonary Disease

## 2014-06-17 ENCOUNTER — Other Ambulatory Visit: Payer: Self-pay | Admitting: Gynecology

## 2014-06-22 ENCOUNTER — Other Ambulatory Visit: Payer: Self-pay | Admitting: Gynecology

## 2014-06-22 ENCOUNTER — Other Ambulatory Visit: Payer: Self-pay | Admitting: Pulmonary Disease

## 2014-06-22 NOTE — Telephone Encounter (Signed)
Per 05/12/14 note " Okay for 1 month refill. She definitely though needs to find a primary M.D. To follow.

## 2014-06-23 ENCOUNTER — Other Ambulatory Visit: Payer: Self-pay | Admitting: Pulmonary Disease

## 2014-06-23 MED ORDER — ATORVASTATIN CALCIUM 40 MG PO TABS: ORAL_TABLET | ORAL | Status: DC

## 2014-06-23 NOTE — Telephone Encounter (Signed)
Refill request received from [facility] for refill on pt atrovastatin. Last office visit 08-19-13. Per SN, ok to refill medication with 3 additional refills. Approval faxed back to [phone].

## 2014-10-26 ENCOUNTER — Encounter: Payer: Self-pay | Admitting: Gastroenterology

## 2015-01-03 ENCOUNTER — Encounter: Payer: Self-pay | Admitting: Gynecology

## 2015-02-21 ENCOUNTER — Other Ambulatory Visit: Payer: Self-pay | Admitting: Acute Care

## 2015-02-21 ENCOUNTER — Encounter: Payer: Self-pay | Admitting: Gynecology

## 2015-02-21 DIAGNOSIS — F17210 Nicotine dependence, cigarettes, uncomplicated: Principal | ICD-10-CM

## 2015-03-02 ENCOUNTER — Ambulatory Visit
Admission: RE | Admit: 2015-03-02 | Discharge: 2015-03-02 | Disposition: A | Discharge status: Home / Self Care | Payer: BLUE CROSS/BLUE SHIELD | Source: Ambulatory Visit | Attending: Acute Care | Admitting: Acute Care

## 2015-03-02 DIAGNOSIS — F17210 Nicotine dependence, cigarettes, uncomplicated: Principal | ICD-10-CM

## 2015-03-14 ENCOUNTER — Encounter: Payer: Self-pay | Admitting: Gynecology

## 2015-03-14 ENCOUNTER — Ambulatory Visit (INDEPENDENT_AMBULATORY_CARE_PROVIDER_SITE_OTHER): Payer: BLUE CROSS/BLUE SHIELD | Admitting: Gynecology

## 2015-03-14 ENCOUNTER — Other Ambulatory Visit (HOSPITAL_COMMUNITY)
Admission: RE | Admit: 2015-03-14 | Discharge: 2015-03-14 | Disposition: A | Discharge status: Home / Self Care | Payer: BLUE CROSS/BLUE SHIELD | Source: Ambulatory Visit | Attending: Gynecology | Admitting: Gynecology

## 2015-03-14 VITALS — BP 120/70 | Ht 66.0 in | Wt 116.0 lb

## 2015-03-14 DIAGNOSIS — Z01419 Encounter for gynecological examination (general) (routine) without abnormal findings: Secondary | ICD-10-CM

## 2015-03-14 DIAGNOSIS — N952 Postmenopausal atrophic vaginitis: Secondary | ICD-10-CM | POA: Diagnosis not present

## 2015-03-14 NOTE — Addendum Note (Signed)
Addended by: Nelva Nay on: 03/14/2015 02:37 PM   Modules accepted: Orders

## 2015-03-14 NOTE — Patient Instructions (Signed)
Schedule your colonoscopy with:  [facility]   Address: [place], [place], [place] [address]  [phone]   Consider Stop Smoking.  Help is available at [facility] smoking cessation program @ www.[facility].com or 5022313371. OR 1-800-QUIT-NOW 5518572023) for free smoking cessation counseling.  Smokefree.gov (Inrails.tn) provides free, accurate, evidence-based information and professional assistance to help support the immediate and long-term needs of people trying to quit smoking.    Smoking Hazards Smoking cigarettes is extremely bad for your health. Tobacco smoke has over 200 known poisons in it. There are over 60 chemicals in tobacco smoke that cause cancer. Some of the chemicals found in cigarette smoke include:  Cyanide.  Benzene.  Formaldehyde.  Methanol (wood alcohol).  Acetylene (fuel used in welding torches).  Ammonia.  Cigarette smoke also contains the poisonous gases nitrogen oxide and carbon monoxide.  Cigarette smokers have an increased risk of many serious medical problems, including: Lung cancer.  Lung disease (such as pneumonia, bronchitis, and emphysema).  Heart attack and chest pain due to the heart not getting enough oxygen (angina).  Heart disease and peripheral blood vessel disease.  Hypertension.  Stroke.  Oral cancer (cancer of the lip, mouth, or voice box).  Bladder cancer.  Pancreatic cancer.  Cervical cancer.  Pregnancy complications, including premature birth.  Low birthweight babies.  Early menopause.  Lower estrogen level for women.  Infertility.  Facial wrinkles.  Blindness.  Increased risk of broken bones (fractures).  Senile dementia.  Stillbirths and smaller newborn babies, birth defects, and genetic damage to sperm.  Stomach ulcers and internal bleeding.  Children of smokers have an increased risk of the following, because of secondhand smoke exposure:  Sudden infant death syndrome  (SIDS).  Respiratory infections.  Lung cancer.  Heart disease.  Ear infections.  Smoking causes approximately: 90% of all lung cancer deaths in men.  80% of all lung cancer deaths in women.  90% of deaths from chronic obstructive lung disease.  Compared with nonsmokers, smoking increases the risk of: Coronary heart disease by 2 to 4 times.  Stroke by 2 to 4 times.  Men developing lung cancer by 23 times.  Women developing lung cancer by 13 times.  Dying from chronic obstructive lung diseases by 12 times.  Someone who smokes 2 packs a day loses about 8 years of his or her life. Even smoking lightly shortens your life expectancy by several years. You can greatly reduce the risk of medical problems for you and your family by stopping now. Smoking is the most preventable cause of death and disease in our society. Within days of quitting smoking, your circulation returns to normal, you decrease the risk of having a heart attack, and your lung capacity improves. There may be some increased phlegm in the first few days after quitting, and it may take months for your lungs to clear up completely. Quitting for 10 years cuts your lung cancer risk to almost that of a nonsmoker. WHY IS SMOKING ADDICTIVE? Nicotine is the chemical agent in tobacco that is capable of causing addiction or dependence.  When you smoke and inhale, nicotine is absorbed rapidly into the bloodstream through your lungs. Nicotine absorbed through the lungs is capable of creating a powerful addiction. Both inhaled and non-inhaled nicotine may be addictive.  Addiction studies of cigarettes and spit tobacco show that addiction to nicotine occurs mainly during the teen years, when young people begin using tobacco products.  WHAT ARE THE BENEFITS OF QUITTING?  There  are many health benefits to quitting smoking.  Likelihood of developing cancer and heart disease decreases. Health improvements are seen almost immediately.  Blood pressure,  pulse rate, and breathing patterns start returning to normal soon after quitting.  People who quit may see an improvement in their overall quality of life.  Some people choose to quit all at once. Other options include nicotine replacement products, such as patches, gum, and nasal sprays. Do not use these products without first checking with your caregiver. QUITTING SMOKING It is not easy to quit smoking. Nicotine is addicting, and longtime habits are hard to change. To start, you can write down all your reasons for quitting, tell your family and friends you want to quit, and ask for their help. Throw your cigarettes away, chew gum or cinnamon sticks, keep your hands busy, and drink extra water or juice. Go for walks and practice deep breathing to relax. Think of all the money you are saving: around $1,000 a year, for the average pack-a-day smoker. Nicotine patches and gum have been shown to improve success at efforts to stop smoking. Zyban (bupropion) is an anti-depressant drug that can be prescribed to reduce nicotine withdrawal symptoms and to suppress the urge to smoke. Smoking is an addiction with both physical and psychological effects. Joining a stop-smoking support group can help you cope with the emotional issues. For more information and advice on programs to stop smoking, call your doctor, your local hospital, or these organizations: American Lung Association - 1-800-LUNGUSA   Smoking Cessation  This document explains the best ways for you to quit smoking and new treatments to help. It lists new medicines that can double or triple your chances of quitting and quitting for good. It also considers ways to avoid relapses and concerns you may have about quitting, including weight gain. NICOTINE: A POWERFUL ADDICTION If you have tried to quit smoking, you know how hard it can be. It is hard because nicotine is a very addictive drug. For some people, it can be as addictive as heroin or cocaine.  Usually, people make 2 or 3 tries, or more, before finally being able to quit. Each time you try to quit, you can learn about what helps and what hurts. Quitting takes hard work and a lot of effort, but you can quit smoking. QUITTING SMOKING IS ONE OF THE MOST IMPORTANT THINGS YOU WILL EVER DO.  You will live longer, feel better, and live better.   The impact on your body of quitting smoking is felt almost immediately:   Within 20 minutes, blood pressure decreases. Pulse returns to its normal level.   After 8 hours, carbon monoxide levels in the blood return to normal. Oxygen level increases.   After 24 hours, chance of heart attack starts to decrease. Breath, hair, and body stop smelling like smoke.   After 48 hours, damaged nerve endings begin to recover. Sense of taste and smell improve.   After 72 hours, the body is virtually free of nicotine. Bronchial tubes relax and breathing becomes easier.   After 2 to 12 weeks, lungs can hold more air. Exercise becomes easier and circulation improves.   Quitting will reduce your risk of having a heart attack, stroke, cancer, or lung disease:   After 1 year, the risk of coronary heart disease is cut in half.   After 5 years, the risk of stroke falls to the same as a nonsmoker.   After 10 years, the risk of lung cancer is cut  in half and the risk of other cancers decreases significantly.   After 15 years, the risk of coronary heart disease drops, usually to the level of a nonsmoker.   If you are pregnant, quitting smoking will improve your chances of having a healthy baby.   The people you live with, especially your children, will be healthier.   You will have extra money to spend on things other than cigarettes.  FIVE KEYS TO QUITTING Studies have shown that these 5 steps will help you quit smoking and quit for good. You have the best chances of quitting if you use them together:  Get ready.   Get support and encouragement.   Learn  new skills and behaviors.   Get medicine to reduce your nicotine addiction and use it correctly.   Be prepared for relapse or difficult situations. Be determined to continue trying to quit, even if you do not succeed at first.  1. GET READY  Set a quit date.   Change your environment.   Get rid of ALL cigarettes, ashtrays, matches, and lighters in your home, car, and place of work.   Do not let people smoke in your home.   Review your past attempts to quit. Think about what worked and what did not.   Once you quit, do not smoke. NOT EVEN A PUFF!  2. GET SUPPORT AND ENCOURAGEMENT Studies have shown that you have a better chance of being successful if you have help. You can get support in many ways.  Tell your family, friends, and coworkers that you are going to quit and need their support. Ask them not to smoke around you.   Talk to your caregivers (doctor, dentist, nurse, pharmacist, psychologist, and/or smoking counselor).   Get individual, group, or telephone counseling and support. The more counseling you have, the better your chances are of quitting. Programs are available at [employer] and health centers. Call your local health department for information about programs in your area.   Spiritual beliefs and practices may help some smokers quit.   Quit meters are Insurance underwriter that keep track of quit statistics, such as amount of "quit-time," cigarettes not smoked, and money saved.   Many smokers find one or more of the many self-help books available useful in helping them quit and stay off tobacco.  3. LEARN NEW SKILLS AND BEHAVIORS  Try to distract yourself from urges to smoke. Talk to someone, go for a walk, or occupy your time with a task.   When you first try to quit, change your routine. Take a different route to work. Drink tea instead of coffee. Eat breakfast in a different place.   Do something to reduce your stress. Take a hot  bath, exercise, or read a book.   Plan something enjoyable to do every day. Reward yourself for not smoking.   Explore interactive web-based programs that specialize in helping you quit.  4. GET MEDICINE AND USE IT CORRECTLY Medicines can help you stop smoking and decrease the urge to smoke. Combining medicine with the above behavioral methods and support can quadruple your chances of successfully quitting smoking. The [place] Food and Drug Administration (FDA) has approved 7 medicines to help you quit smoking. These medicines fall into 3 categories.  Nicotine replacement therapy (delivers nicotine to your body without the negative effects and risks of smoking):   Nicotine gum: Available over-the-counter.   Nicotine lozenges: Available over-the-counter.   Nicotine inhaler: Available by prescription.  Nicotine nasal spray: Available by prescription.   Nicotine skin patches (transdermal): Available by prescription and over-the-counter.   Antidepressant medicine (helps people abstain from smoking, but how this works is unknown):   Bupropion sustained-release (SR) tablets: Available by prescription.   Nicotinic receptor partial agonist (simulates the effect of nicotine in your brain):   Varenicline tartrate tablets: Available by prescription.   Ask your caregiver for advice about which medicines to use and how to use them. Carefully read the information on the package.   Everyone who is trying to quit may benefit from using a medicine. If you are pregnant or trying to become pregnant, nursing an infant, you are under age 50, or you smoke fewer than 10 cigarettes per day, talk to your caregiver before taking any nicotine replacement medicines.   You should stop using a nicotine replacement product and call your caregiver if you experience nausea, dizziness, weakness, vomiting, fast or irregular heartbeat, mouth problems with the lozenge or gum, or redness or swelling of the skin around the  patch that does not go away.   Do not use any other product containing nicotine while using a nicotine replacement product.   Talk to your caregiver before using these products if you have diabetes, heart disease, asthma, stomach ulcers, you had a recent heart attack, you have high blood pressure that is not controlled with medicine, a history of irregular heartbeat, or you have been prescribed medicine to help you quit smoking.  5. BE PREPARED FOR RELAPSE OR DIFFICULT SITUATIONS  Most relapses occur within the first 3 months after quitting. Do not be discouraged if you start smoking again. Remember, most people try several times before they finally quit.   You may have symptoms of withdrawal because your body is used to nicotine. You may crave cigarettes, be irritable, feel very hungry, cough often, get headaches, or have difficulty concentrating.   The withdrawal symptoms are only temporary. They are strongest when you first quit, but they will go away within 10 to 14 days.  Here are some difficult situations to watch for:  Alcohol. Avoid drinking alcohol. Drinking lowers your chances of successfully quitting.   Caffeine. Try to reduce the amount of caffeine you consume. It also lowers your chances of successfully quitting.   Other smokers. Being around smoking can make you want to smoke. Avoid smokers.   Weight gain. Many smokers will gain weight when they quit, usually less than 10 pounds. Eat a healthy diet and stay active. Do not let weight gain distract you from your main goal, quitting smoking. Some medicines that help you quit smoking may also help delay weight gain. You can always lose the weight gained after you quit.   Bad mood or depression. There are a lot of ways to improve your mood other than smoking.  If you are having problems with any of these situations, talk to your caregiver. SPECIAL SITUATIONS AND CONDITIONS Studies suggest that everyone can quit smoking. Your  situation or condition can give you a special reason to quit.  Pregnant women/new mothers: By quitting, you protect your baby's health and your own.   Hospitalized patients: By quitting, you reduce health problems and help healing.   Heart attack patients: By quitting, you reduce your risk of a second heart attack.   Lung, head, and neck cancer patients: By quitting, you reduce your chance of a second cancer.   Parents of children and adolescents: By quitting, you protect your children from illnesses caused  by secondhand smoke.  QUESTIONS TO THINK ABOUT Think about the following questions before you try to stop smoking. You may want to talk about your answers with your caregiver.  Why do you want to quit?   If you tried to quit in the past, what helped and what did not?   What will be the most difficult situations for you after you quit? How will you plan to handle them?   Who can help you through the tough times? Your family? Friends? Caregiver?   What pleasures do you get from smoking? What ways can you still get pleasure if you quit?  Here are some questions to ask your caregiver:  How can you help me to be successful at quitting?   What medicine do you think would be best for me and how should I take it?   What should I do if I need more help?   What is smoking withdrawal like? How can I get information on withdrawal?  Quitting takes hard work and a lot of effort, but you can quit smoking.  You may obtain a copy of any labs that were done today by logging onto MyChart as outlined in the instructions provided with your AVS (after visit summary). The office will not call with normal lab results but certainly if there are any significant abnormalities then we will contact you.   Health Maintenance Adopting a healthy lifestyle and getting preventive care can go a long way to promote health and wellness. Talk with your health care provider about what schedule of regular  examinations is right for you. This is a good chance for you to check in with your provider about disease prevention and staying healthy. In between checkups, there are plenty of things you can do on your own. Experts have done a lot of research about which lifestyle changes and preventive measures are most likely to keep you healthy. Ask your health care provider for more information. WEIGHT AND DIET  Eat a healthy diet  Be sure to include plenty of vegetables, fruits, low-fat dairy products, and lean protein.  Do not eat a lot of foods high in solid fats, added sugars, or salt.  Get regular exercise. This is one of the most important things you can do for your health.  Most adults should exercise for at least 150 minutes each week. The exercise should increase your heart rate and make you sweat (moderate-intensity exercise).  Most adults should also do strengthening exercises at least twice a week. This is in addition to the moderate-intensity exercise.  Maintain a healthy weight  Body mass index (BMI) is a measurement that can be used to identify possible weight problems. It estimates body fat based on height and weight. Your health care provider can help determine your BMI and help you achieve or maintain a healthy weight.  For females 19 years of age and older:   A BMI below 18.5 is considered underweight.  A BMI of 18.5 to 24.9 is normal.  A BMI of 25 to 29.9 is considered overweight.  A BMI of 30 and above is considered obese.  Watch levels of cholesterol and blood lipids  You should start having your blood tested for lipids and cholesterol at 64 years of age, then have this test every 5 years.  You may need to have your cholesterol levels checked more often if:  Your lipid or cholesterol levels are high.  You are older than 64 years of age.  You  are at high risk for heart disease.  CANCER SCREENING   Lung Cancer  Lung cancer screening is recommended for adults  92-57 years old who are at high risk for lung cancer because of a history of smoking.  A yearly low-dose CT scan of the lungs is recommended for people who:  Currently smoke.  Have quit within the past 15 years.  Have at least a 30-pack-year history of smoking. A pack year is smoking an average of one pack of cigarettes a day for 1 year.  Yearly screening should continue until it has been 15 years since you quit.  Yearly screening should stop if you develop a health problem that would prevent you from having lung cancer treatment.  Breast Cancer  Practice breast self-awareness. This means understanding how your breasts normally appear and feel.  It also means doing regular breast self-exams. Let your health care provider know about any changes, no matter how small.  If you are in your 20s or 30s, you should have a clinical breast exam (CBE) by a health care provider every 1-3 years as part of a regular health exam.  If you are 75 or older, have a CBE every year. Also consider having a breast X-ray (mammogram) every year.  If you have a family history of breast cancer, talk to your health care provider about genetic screening.  If you are at high risk for breast cancer, talk to your health care provider about having an MRI and a mammogram every year.  Breast cancer gene (BRCA) assessment is recommended for women who have family members with BRCA-related cancers. BRCA-related cancers include:  Breast.  Ovarian.  Tubal.  Peritoneal cancers.  Results of the assessment will determine the need for genetic counseling and BRCA1 and BRCA2 testing. Cervical Cancer Routine pelvic examinations to screen for cervical cancer are no longer recommended for nonpregnant women who are considered low risk for cancer of the pelvic organs (ovaries, uterus, and vagina) and who do not have symptoms. A pelvic examination may be necessary if you have symptoms including those associated with pelvic  infections. Ask your health care provider if a screening pelvic exam is right for you.   The Pap test is the screening test for cervical cancer for women who are considered at risk.  If you had a hysterectomy for a problem that was not cancer or a condition that could lead to cancer, then you no longer need Pap tests.  If you are older than 65 years, and you have had normal Pap tests for the past 10 years, you no longer need to have Pap tests.  If you have had past treatment for cervical cancer or a condition that could lead to cancer, you need Pap tests and screening for cancer for at least 20 years after your treatment.  If you no longer get a Pap test, assess your risk factors if they change (such as having a new sexual partner). This can affect whether you should start being screened again.  Some women have medical problems that increase their chance of getting cervical cancer. If this is the case for you, your health care provider may recommend more frequent screening and Pap tests.  The human papillomavirus (HPV) test is another test that may be used for cervical cancer screening. The HPV test looks for the virus that can cause cell changes in the cervix. The cells collected during the Pap test can be tested for HPV.  The HPV test can be  used to screen women 54 years of age and older. Getting tested for HPV can extend the interval between normal Pap tests from three to five years.  An HPV test also should be used to screen women of any age who have unclear Pap test results.  After 64 years of age, women should have HPV testing as often as Pap tests.  Colorectal Cancer  This type of cancer can be detected and often prevented.  Routine colorectal cancer screening usually begins at 64 years of age and continues through 64 years of age.  Your health care provider may recommend screening at an earlier age if you have risk factors for colon cancer.  Your health care provider may also  recommend using home test kits to check for hidden blood in the stool.  A small camera at the end of a tube can be used to examine your colon directly (sigmoidoscopy or colonoscopy). This is done to check for the earliest forms of colorectal cancer.  Routine screening usually begins at age 17.  Direct examination of the colon should be repeated every 5-10 years through 64 years of age. However, you may need to be screened more often if early forms of precancerous polyps or small growths are found. Skin Cancer  Check your skin from head to toe regularly.  Tell your health care provider about any new moles or changes in moles, especially if there is a change in a mole's [place] or color.  Also tell your health care provider if you have a mole that is larger than the size of a pencil eraser.  Always use sunscreen. Apply sunscreen liberally and repeatedly throughout the day.  Protect yourself by wearing long sleeves, pants, a wide-brimmed hat, and sunglasses whenever you are outside. HEART DISEASE, DIABETES, AND HIGH BLOOD PRESSURE   Have your blood pressure checked at least every 1-2 years. High blood pressure causes heart disease and increases the risk of stroke.  If you are between 77 years and 65 years old, ask your health care provider if you should take aspirin to prevent strokes.  Have regular diabetes screenings. This involves taking a blood sample to check your fasting blood sugar level.  If you are at a normal weight and have a low risk for diabetes, have this test once every three years after 64 years of age.  If you are overweight and have a high risk for diabetes, consider being tested at a younger age or more often. PREVENTING INFECTION  Hepatitis B  If you have a higher risk for hepatitis B, you should be screened for this virus. You are considered at high risk for hepatitis B if:  You were born in a country where hepatitis B is common. Ask your health care provider which  countries are considered high risk.  Your parents were born in a high-risk country, and you have not been immunized against hepatitis B (hepatitis B vaccine).  You have HIV or AIDS.  You use needles to inject street drugs.  You live with someone who has hepatitis B.  You have had sex with someone who has hepatitis B.  You get hemodialysis treatment.  You take certain medicines for conditions, including cancer, organ transplantation, and autoimmune conditions. Hepatitis C  Blood testing is recommended for:  Everyone born from [birth year] through 1964.  Anyone with known risk factors for hepatitis C. Sexually transmitted infections (STIs)  You should be screened for sexually transmitted infections (STIs) including gonorrhea and chlamydia if:  You  are sexually active and are younger than 64 years of age.  You are older than 64 years of age and your health care provider tells you that you are at risk for this type of infection.  Your sexual activity has changed since you were last screened and you are at an increased risk for chlamydia or gonorrhea. Ask your health care provider if you are at risk.  If you do not have HIV, but are at risk, it may be recommended that you take a prescription medicine daily to prevent HIV infection. This is called pre-exposure prophylaxis (PrEP). You are considered at risk if:  You are sexually active and do not regularly use condoms or know the HIV status of your partner(s).  You take drugs by injection.  You are sexually active with a partner who has HIV. Talk with your health care provider about whether you are at high risk of being infected with HIV. If you choose to begin PrEP, you should first be tested for HIV. You should then be tested every 3 months for as long as you are taking PrEP.  PREGNANCY   If you are premenopausal and you may become pregnant, ask your health care provider about preconception counseling.  If you may become pregnant, take  400 to 800 micrograms (mcg) of folic acid every day.  If you want to prevent pregnancy, talk to your health care provider about birth control (contraception). OSTEOPOROSIS AND MENOPAUSE   Osteoporosis is a disease in which the bones lose minerals and strength with aging. This can result in serious bone fractures. Your risk for osteoporosis can be identified using a bone density scan.  If you are 10 years of age or older, or if you are at risk for osteoporosis and fractures, ask your health care provider if you should be screened.  Ask your health care provider whether you should take a calcium or vitamin D supplement to lower your risk for osteoporosis.  Menopause may have certain physical symptoms and risks.  Hormone replacement therapy may reduce some of these symptoms and risks. Talk to your health care provider about whether hormone replacement therapy is right for you.  HOME CARE INSTRUCTIONS   Schedule regular health, dental, and eye exams.  Stay current with your immunizations.   Do not use any tobacco products including cigarettes, chewing tobacco, or electronic cigarettes.  If you are pregnant, do not drink alcohol.  If you are breastfeeding, limit how much and how often you drink alcohol.  Limit alcohol intake to no more than 1 drink per day for nonpregnant women. One drink equals 12 ounces of beer, 5 ounces of wine, or 1 ounces of hard liquor.  Do not use street drugs.  Do not share needles.  Ask your health care provider for help if you need support or information about quitting drugs.  Tell your health care provider if you often feel depressed.  Tell your health care provider if you have ever been abused or do not feel safe at home. Document Released: 09/04/2010 Document Revised: 07/06/2013 Document Reviewed: 01/21/2013 [facility] Patient Information 2015 [place], [place]. This information is not intended to replace advice given to you by your health care provider.  Make sure you discuss any questions you have with your health care provider.

## 2015-03-14 NOTE — Progress Notes (Signed)
Desiree Salinas [birth year] [id number]        65 y.o.  [id number]  for annual exam.  Doing well.  Past medical history,surgical history, problem list, medications, allergies, family history and social history were all reviewed and documented as reviewed in the EPIC chart.  ROS:  Performed with pertinent positives and negatives included in the history, assessment and plan.   Additional significant findings :  none   Exam: Caryn Bee assistant Filed Vitals:   03/14/15 1402  BP: 120/70  Height: 5\' 6"  (1.676 m)  Weight: 116 lb (52.617 kg)   General appearance:  Normal affect, orientation and appearance. Skin: Grossly normal HEENT: Without gross lesions.  No cervical or supraclavicular adenopathy. Thyroid normal.  Lungs:  Clear without wheezing, rales or rhonchi Cardiac: RR, without RMG Abdominal:  Soft, nontender, without masses, guarding, rebound, organomegaly or hernia Breasts:  Examined lying and sitting without masses, retractions, discharge or axillary adenopathy.  Bilateral implants noted Pelvic:  Ext/BUS/vagina with atrophic changes  Cervix with atrophic changes  Uterus anteverted, normal size, shape and contour, midline and mobile nontender   Adnexa  Without masses or tenderness    Anus and perineum  Normal   Rectovaginal  Normal sphincter tone without palpated masses or tenderness.    Assessment/Plan:  65 y.o. [id number] female for annual exam.   1. Postmenopausal/atrophic genital changes.  Status post TAH 1999.  Had been on estradiol but has weaned herself off and is doing well without significant hot flushes, night sweats or vaginal dryness. Continue to monitor reportedly issues. 2. Pap smear/HPV negative 2013. Pap smear done today. 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 Follow up Pap smears negative. 3. Mammography 02/2015. Continue with annual mammography when due. SBE monthly reviewed. 4. DEXA 2014 normal. Recommend repeat at 5 year interval. Increased calcium and  vitamin D. 5. Colonoscopy due now on patients going to call [facility] and schedule. 6. Stop smoking again discussed and encouraged. 7. Health maintenance. No routine lab work done as this is done at her primary physician's office. Follow up 1 year, sooner as needed.   Anastasio Auerbach MD, 2:28 PM 03/14/2015

## 2015-03-15 ENCOUNTER — Telehealth: Payer: Self-pay | Admitting: Acute Care

## 2015-03-15 LAB — URINALYSIS W MICROSCOPIC + REFLEX CULTURE
Bacteria, UA: NONE SEEN [HPF]
Bilirubin Urine: NEGATIVE
Casts: NONE SEEN [LPF]
Crystals: NONE SEEN [HPF]
GLUCOSE, UA: NEGATIVE
Hgb urine dipstick: NEGATIVE
Ketones, ur: NEGATIVE
Leukocytes, UA: NEGATIVE
Nitrite: NEGATIVE
PH: 6.5 (ref 5.0–8.0)
Protein, ur: NEGATIVE
SPECIFIC GRAVITY, URINE: 1.019 (ref 1.001–1.035)
SQUAMOUS EPITHELIAL / LPF: NONE SEEN [HPF] (ref <=5)
WBC, UA: NONE SEEN WBC/HPF (ref <=5)
YEAST: NONE SEEN [HPF]

## 2015-03-15 NOTE — Telephone Encounter (Signed)
Jan. 10, 2017                                                                            Desiree Salinas: DOB: 1950-06-20  Dr. Ardeth Perfect, [name] Low Dose Screening CT was read as a Lung RADS 2, indicating nodules with a very low likelihood of becoming a clinically active cancer due to size or lack of growth, benign in appearance and behavior. The recommendation is for a repeat scan in 12 months, which we will schedule and order in Dec. 2017. We have called these results to [name], who verbalized understanding of them. Please see the complete scan report attatched. Please note the incidental findings of atherosclerosis, including the coronary arteries and follow up as you feel is clinically indicated as you know this patient's history well. Thank you so much, we appreciate the referral and enjoyed working with [name]. Please let me know if you have any questions or concerns regarding the LDCT results.  Eric Form, NP [facility] [phone]   This letter faxed to Dr. Hoover Brunette office 03/15/15

## 2015-03-16 LAB — URINE CULTURE
Colony Count: NO GROWTH
Organism ID, Bacteria: NO GROWTH

## 2015-03-17 LAB — CYTOLOGY - PAP

## 2015-05-25 ENCOUNTER — Other Ambulatory Visit: Payer: Self-pay | Admitting: Acute Care

## 2015-05-25 DIAGNOSIS — F17210 Nicotine dependence, cigarettes, uncomplicated: Principal | ICD-10-CM

## 2015-09-15 ENCOUNTER — Encounter: Payer: Self-pay | Admitting: Gastroenterology

## 2015-11-14 ENCOUNTER — Ambulatory Visit (AMBULATORY_SURGERY_CENTER): Payer: Self-pay | Admitting: *Deleted

## 2015-11-14 VITALS — Ht 65.5 in | Wt 112.6 lb

## 2015-11-14 DIAGNOSIS — Z86010 Personal history of colonic polyps: Secondary | ICD-10-CM

## 2015-11-14 MED ORDER — NA SULFATE-K SULFATE-MG SULF 17.5-3.13-1.6 GM/177ML PO SOLN: 1.0000 | Freq: Once | ORAL | 0 refills | Status: AC

## 2015-11-14 NOTE — Progress Notes (Signed)
No egg or soy allergy known to patient  No issues with past sedation with any surgeries  or procedures, no intubation problems  No diet pills per patient No home 02 use per patient  No blood thinners per patient  Pt denies issues with constipation  No A fib or A flutter   

## 2015-11-16 ENCOUNTER — Encounter: Payer: Self-pay | Admitting: Gastroenterology

## 2015-11-28 ENCOUNTER — Ambulatory Visit (AMBULATORY_SURGERY_CENTER): Payer: BLUE CROSS/BLUE SHIELD | Admitting: Gastroenterology

## 2015-11-28 ENCOUNTER — Encounter: Payer: Self-pay | Admitting: Gastroenterology

## 2015-11-28 VITALS — BP 110/60 | HR 72 | Temp 98.9°F | Resp 12 | Ht 65.0 in | Wt 112.0 lb

## 2015-11-28 DIAGNOSIS — Z86010 Personal history of colonic polyps: Secondary | ICD-10-CM | POA: Diagnosis not present

## 2015-11-28 MED ORDER — SODIUM CHLORIDE 0.9 % IV SOLN: 500.0000 mL | INTRAVENOUS | Status: DC

## 2015-11-28 NOTE — Progress Notes (Signed)
A/ox3, pleased with MAC, report to RN 

## 2015-11-28 NOTE — Op Note (Signed)
[facility] Patient Name: Desiree Salinas Procedure Date: 11/28/2015 10:03 AM MRN: [id number] Endoscopist: Mallie Mussel L. Loletha Carrow , MD Age: 65 Referring MD:  Date of Birth: 18-Aug-1950 Gender: Female Account #: [id number] Procedure:                Colonoscopy Indications:              Screening for colorectal malignant neoplasm (no                            adenomatous polyps on colonoscopy 12/2004) Medicines:                Monitored Anesthesia Care Procedure:                Pre-Anesthesia Assessment:                           - Prior to the procedure, a History and Physical                            was performed, and patient medications and                            allergies were reviewed. The patient's tolerance of                            previous anesthesia was also reviewed. The risks                            and benefits of the procedure and the sedation                            options and risks were discussed with the patient.                            All questions were answered, and informed consent                            was obtained. Prior Anticoagulants: The patient has                            taken no previous anticoagulant or antiplatelet                            agents. ASA Grade Assessment: II - A patient with                            mild systemic disease. After reviewing the risks                            and benefits, the patient was deemed in                            satisfactory condition to undergo the procedure.  After obtaining informed consent, the colonoscope                            was passed under direct vision. Throughout the                            procedure, the patient's blood pressure, pulse, and                            oxygen saturations were monitored continuously. The                            Model PCF-H190L [id number]) scope was introduced                            through the anus and  advanced to the the cecum,                            identified by appendiceal orifice and ileocecal                            valve. The colonoscopy was performed without                            difficulty. The patient tolerated the procedure                            well. The quality of the bowel preparation was                            good. The ileocecal valve, appendiceal orifice, and                            rectum were photographed. The bowel preparation                            used was SUPREP. Scope In: 10:21:37 AM Scope Out: 10:43:39 AM Scope Withdrawal Time: 0 hours 8 minutes 1 second  Total Procedure Duration: 0 hours 22 minutes 2 seconds  Findings:                 The perianal and digital rectal examinations were                            normal.                           The colon (entire examined portion) was moderately                            tortuous. Advancing the scope required changing the                            patient to a supine position and applying abdominal  pressure.                           The exam was otherwise without abnormality on                            direct and retroflexion views. Complications:            No immediate complications. Estimated Blood Loss:     Estimated blood loss: none. Impression:               - Tortuous colon.                           - The examination was otherwise normal on direct                            and retroflexion views.                           - No specimens collected. Recommendation:           - Patient has a contact number available for                            emergencies. The signs and symptoms of potential                            delayed complications were discussed with the                            patient. Return to normal activities tomorrow.                            Written discharge instructions were provided to the                             patient.                           - Resume previous diet.                           - Continue present medications.                           - Repeat colonoscopy in 10 years for screening                            purposes. Zoya Sprecher L. Loletha Carrow, MD 11/28/2015 10:47:13 AM This report has been signed electronically.

## 2015-11-28 NOTE — Patient Instructions (Signed)
YOU HAD AN ENDOSCOPIC PROCEDURE TODAY AT [facility]:   Refer to the procedure report that was given to you for any specific questions about what was found during the examination.  If the procedure report does not answer your questions, please call your gastroenterologist to clarify.  If you requested that your care partner not be given the details of your procedure findings, then the procedure report has been included in a sealed envelope for you to review at your convenience later.  YOU SHOULD EXPECT: Some feelings of bloating in the abdomen. Passage of more gas than usual.  Walking can help get rid of the air that was put into your GI tract during the procedure and reduce the bloating. If you had a lower endoscopy (such as a colonoscopy or flexible sigmoidoscopy) you may notice spotting of blood in your stool or on the toilet paper. If you underwent a bowel prep for your procedure, you may not have a normal bowel movement for a few days.  Please Note:  You might notice some irritation and congestion in your nose or some drainage.  This is from the oxygen used during your procedure.  There is no need for concern and it should clear up in a day or so.  SYMPTOMS TO REPORT IMMEDIATELY:   Following lower endoscopy (colonoscopy or flexible sigmoidoscopy):  Excessive amounts of blood in the stool  Significant tenderness or worsening of abdominal pains  Swelling of the abdomen that is new, acute  Fever of 100F or higher   For urgent or emergent issues, a gastroenterologist can be reached at any hour by calling [phone].   DIET:  We do recommend a small meal at first, but then you may proceed to your regular diet.  Drink plenty of fluids but you should avoid alcoholic beverages for 24 hours.  ACTIVITY:  You should plan to take it easy for the rest of today and you should NOT DRIVE or use heavy machinery until tomorrow (because of the sedation medicines used during the test).     FOLLOW UP: Our staff will call the number listed on your records the next business day following your procedure to check on you and address any questions or concerns that you may have regarding the information given to you following your procedure. If we do not reach you, we will leave a message.  However, if you are feeling well and you are not experiencing any problems, there is no need to return our call.  We will assume that you have returned to your regular daily activities without incident.  If any biopsies were taken you will be contacted by phone or by letter within the next 1-3 weeks.  Please call us at [phone] if you have not heard about the biopsies in 3 weeks.    SIGNATURES/CONFIDENTIALITY: You and/or your care partner have signed paperwork which will be entered into your electronic medical record.  These signatures attest to the fact that that the information above on your After Visit Summary has been reviewed and is understood.  Full responsibility of the confidentiality of this discharge information lies with you and/or your care-partner.  Repeat colonoscopy in 10 years 2027.  Normal exam.

## 2015-11-28 NOTE — Progress Notes (Signed)
USED MARIJUANA YESTERDAY.

## 2015-11-29 ENCOUNTER — Telehealth: Payer: Self-pay

## 2015-11-29 NOTE — Telephone Encounter (Signed)
  Follow up Call-  Call back number 11/28/2015  Post procedure Call Back phone  # [phone]  Permission to leave phone message Yes  Some recent data might be hidden     Patient questions:  Do you have a fever, pain , or abdominal swelling? No. Pain Score  0 *  Have you tolerated food without any problems? Yes.    Have you been able to return to your normal activities? Yes.    Do you have any questions about your discharge instructions: Diet   No. Medications  No. Follow up visit  No.  Do you have questions or concerns about your Care? No.  Actions: * If pain score is 4 or above: No action needed, pain <4.

## 2015-12-30 ENCOUNTER — Ambulatory Visit (INDEPENDENT_AMBULATORY_CARE_PROVIDER_SITE_OTHER): Payer: BLUE CROSS/BLUE SHIELD | Admitting: Neurology

## 2015-12-30 ENCOUNTER — Encounter: Payer: Self-pay | Admitting: Neurology

## 2015-12-30 VITALS — BP 121/75 | HR 65 | Ht 65.0 in | Wt 115.6 lb

## 2015-12-30 DIAGNOSIS — E538 Deficiency of other specified B group vitamins: Secondary | ICD-10-CM

## 2015-12-30 DIAGNOSIS — R4781 Slurred speech: Secondary | ICD-10-CM | POA: Diagnosis not present

## 2015-12-30 DIAGNOSIS — R413 Other amnesia: Secondary | ICD-10-CM

## 2015-12-30 DIAGNOSIS — R4789 Other speech disturbances: Secondary | ICD-10-CM

## 2015-12-30 NOTE — Progress Notes (Signed)
[facility]    Provider:  Dr Jaynee Eagles Referring Provider: [name], MD Primary Care Physician:  [name], MD  CC:  Memory changes  HPI:  Desiree Salinas is a 65 y.o. female here as a referral from Dr. Ardeth Perfect for memory changes. Past medical history of depression, anxiety, high cholesterol, chronic kidney disease, insomnia, tobacco use, vitamin D deficiency, attention deficit disorder on adderall.  Memory problems going on for "a while" maybe a year, she loses her train of thought, her younger sister visited her because she was worried patient was hoarding which she was not, she is losing track of her thoughts, she had one episode where she had too much medicine possibly but unsure why she felt her speech was slurred. She lives independently, alone, manage all her own affairs, no bills missing, she is driving fine but at night when driving she feels disoriented. Lists and sticky notes help. She is also ADD and has depression and anxiety. No family history of dementia. Sometimes forgets what she is doing, where she is when driving. No personality changes, no delusions or hallucinations, no inciting events or head trauma, no other associated symptoms or modifying factors. Symptoms wax and wane not progressive. Not interfering with her ADLs or IADLs.   Reviewed notes, labs and imaging from outside physicians, which showed:  Reviewed notes from primary care. She is a current every day smoker. She drinks wine occasionally. No risk for alcohol abuse reviewed her screening. States she feels like her memory is failing and she is also very disorganized. Her sister recently came to help her clean up her home. She states she has an Fhx of hoarding and her family and is afraid of being in that way. States high anxiety and is on many medications. Says her medications for psychiatric disorders were "tweaked" at last visit. She forgets things like what she is going to stay. She is very  anxious and fearful to go out of the home. Being treated for anxiety, ADD and depression. She follows with psychiatry. Symptoms ongoing for a year.  BUN 16, creatinine 1.1 10/27/2015, TSH 1.7.   Review of Systems: Patient complains of symptoms per HPI as well as the following symptoms: No CP, no SOB, no fevers, no chills. Pertinent negatives per HPI. All others negative.   Social History   Social History  . Marital status: Divorced    Spouse name: N/A  . Number of children: 2  . Years of education: College   Occupational History  . retired    Social History Main Topics  . Smoking status: Current Every Day Smoker    Packs/day: 1.00    Types: Cigarettes  . Smokeless tobacco: Never Used  . Alcohol use 0.0 oz/week     Comment: very rare   . Drug use:     Types: Marijuana  . Sexual activity: No     Comment: 1st intercourse 65 yo-More than 5 partners   Other Topics Concern  . Not on file   Social History Narrative   Lives: alone   Caffeine use: 2 cups coffee/day    Family History  Problem Relation Age of Onset  . Breast cancer Mother     Age 30's  . Diabetes Maternal Grandmother   . Aneurysm Maternal Grandmother   . Aneurysm Maternal Aunt   . Aneurysm Maternal Grandfather   . Colon cancer Neg Hx   . Colon polyps Neg Hx   . Esophageal cancer Neg Hx   .  Rectal cancer Neg Hx   . Stomach cancer Neg Hx   . Dementia Neg Hx     Past Medical History:  Diagnosis Date  . ADD (attention deficit disorder)   . Allergy   . Anxiety   . Colon polyps    HYPERPLASTIC POLYP  . Depression   . Elevated cholesterol   . Shingles   . STD (sexually transmitted disease)    Condylomata  . 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 ABDOMINAL HYSTERECTOMY  1999   TAH  . ABDOMINAL SURGERY  1999   Abdominoplasty  . ACHILLES TENDON SURGERY    . AUGMENTATION MAMMAPLASTY     Implants    . COLONOSCOPY  2006  . POLYPECTOMY    . TONSILLECTOMY AND ADENOIDECTOMY    . TUBAL LIGATION      Current Outpatient Prescriptions  Medication Sig Dispense Refill  . ADDERALL XR 15 MG 24 hr capsule   0  . atorvastatin (LIPITOR) 40 MG tablet TAKE 1 BY MOUTH DAILY 30 tablet 3  . buPROPion (WELLBUTRIN SR) 200 MG 12 hr tablet   1  . busPIRone (BUSPAR) 10 MG tablet Take 10 mg by mouth. Take 2 tablets by mouth every morning and one tablet at bedtime    . MELATONIN PO Take 3 mg by mouth as needed.    . pindolol (VISKEN) 5 MG tablet Take 10 mg by mouth daily.      Current Facility-Administered Medications  Medication Dose Route Frequency Provider Last Rate Last Dose  . 0.9 %  sodium chloride infusion  500 mL Intravenous Continuous Nelida Meuse III, MD      . Genia Hotter Durwin Reges) injection 0.5 mL  0.5 mL Intramuscular Once Noralee Space, MD        Allergies as of 12/30/2015 - Review Complete 11/28/2015  Allergen Reaction Noted  . Eucerin [basis facial moisturizer]  08/26/2012  . Latex    . Nickel Hives 12/11/2010  . Cobalt Rash 10/13/2015    Vitals: BP 121/75 (BP Location: Left Arm, Patient Position: Sitting, Cuff Size: Normal)   Pulse 65   Ht 5\' 5"  (1.651 m)   Wt 115 lb 9.6 oz (52.4 kg)   BMI 19.24 kg/m  Last Weight:  Wt Readings from Last 1 Encounters:  12/30/15 115 lb 9.6 oz (52.4 kg)   Last Height:   Ht Readings from Last 1 Encounters:  12/30/15 5\' 5"  (1.651 m)   Physical exam: Exam: Gen: NAD, conversant, well nourised, thin, well groomed                     CV: RRR, no MRG. No Carotid Bruits. No peripheral edema, warm, nontender Eyes: Conjunctivae clear without exudates or hemorrhage  Neuro: Detailed Neurologic Exam  Speech:    Speech is normal; fluent and spontaneous with normal comprehension.  Cognition:  MMSE - Mini Mental State Exam 12/30/2015  Orientation to time 5  Orientation to Place 5  Registration 3  Attention/ Calculation 5  Recall 2  Language- name  2 objects 2  Language- repeat 1  Language- follow 3 step command 3  Language- read & follow direction 1  Write a sentence 1  Copy design 1  Total score 29      The patient is oriented to person, place, and time;     recent and remote memory intact;  language fluent;     normal attention, concentration,     fund of knowledge Cranial Nerves:    The pupils are equal, round, and reactive to light. The fundi are normal and spontaneous venous pulsations are present. Visual fields are full to finger confrontation. Extraocular movements are intact. Trigeminal sensation is intact and the muscles of mastication are normal. The face is symmetric. The palate elevates in the midline. Hearing intact. Voice is normal. Shoulder shrug is normal. The tongue has normal motion without fasciculations.   Coordination:    Normal finger to nose and heel to shin. Normal rapid alternating movements.   Gait:    Heel-toe and tandem gait are normal.   Motor Observation:    No asymmetry, no atrophy, and no involuntary movements noted. Tone:    Normal muscle tone.    Posture:    Posture is normal. normal erect    Strength:    Strength is V/V in the upper and lower limbs.      Sensation: intact to LT     Reflex Exam:  DTR's:    Biceps and patellars in the upper and lower extremities are brisk and symmetric bilaterally.   Toes:    The toes are downgoing bilaterally.   Clonus:    Clonus is absent.    Assessment/Plan:  65 year old with here as a referral from Dr. Ardeth Perfect for memory changes. Past medical history of depression, anxiety, high cholesterol, chronic kidney disease, insomnia, tobacco use, vitamin D deficiency, attention deficit disorder on adderall.  MMSE 29/30. Neuro exam non focal. No Fhx of dementia.  Symptoms likely multifactorial due to normal cognitive aging, anxiety, ADD and psychiatric disorders. Reassured patient. Will order MRi of the brain to rule out stroke or other etiology  for her memory problems, slurred speech, word-finding difficulty.  Check B12, folate, rpr, hiv Continued f/u with therapy and psychiatry Advised continued cigarette smoking is a risk factor for dementia, encouraged cessation  Cc: [name], MD   Sarina Ill, MD  [facility] [address] [place], [place] [address]  Phone [phone] Fax [phone]

## 2015-12-30 NOTE — Patient Instructions (Signed)
Remember to drink plenty of fluid, eat healthy meals and do not skip any meals. Try to eat protein with a every meal and eat a healthy snack such as fruit or nuts in between meals. Try to keep a regular sleep-wake schedule and try to exercise daily, particularly in the form of walking, 20-30 minutes a day, if you can.   As far as diagnostic testing: labs, mri of the brain  I would like to see you back as needed, sooner if we need to. Please call us with any interim questions, concerns, problems, updates or refill requests.   Our phone number is [phone]. We also have an after hours call service for urgent matters and there is a physician on-call for urgent questions. For any emergencies you know to call 911 or go to the nearest emergency room

## 2015-12-31 LAB — B12 AND FOLATE PANEL
FOLATE: 7 ng/mL (ref >3.0)
VITAMIN B 12: 415 pg/mL (ref 211–946)

## 2015-12-31 LAB — HIV ANTIBODY (ROUTINE TESTING W REFLEX): HIV SCREEN 4TH GENERATION: NONREACTIVE

## 2015-12-31 LAB — RPR: RPR Ser Ql: NONREACTIVE

## 2016-01-01 ENCOUNTER — Encounter: Payer: Self-pay | Admitting: Neurology

## 2016-01-02 ENCOUNTER — Telehealth: Payer: Self-pay | Admitting: *Deleted

## 2016-01-02 NOTE — Telephone Encounter (Signed)
-----   Message from Melvenia Beam, MD sent at 01/01/2016  6:44 PM EDT ----- Labs normal thanks

## 2016-01-02 NOTE — Telephone Encounter (Signed)
Called and spoke to pt about normal labs. Pt verbalized understanding.

## 2016-03-08 ENCOUNTER — Ambulatory Visit
Admission: RE | Admit: 2016-03-08 | Discharge: 2016-03-08 | Disposition: A | Discharge status: Home / Self Care | Payer: BLUE CROSS/BLUE SHIELD | Source: Ambulatory Visit | Attending: Acute Care | Admitting: Acute Care

## 2016-03-08 DIAGNOSIS — F17210 Nicotine dependence, cigarettes, uncomplicated: Principal | ICD-10-CM

## 2016-03-13 ENCOUNTER — Other Ambulatory Visit: Payer: Self-pay | Admitting: Acute Care

## 2016-03-13 DIAGNOSIS — F17210 Nicotine dependence, cigarettes, uncomplicated: Secondary | ICD-10-CM

## 2016-08-01 ENCOUNTER — Ambulatory Visit: Payer: BLUE CROSS/BLUE SHIELD | Admitting: Neurology

## 2016-09-25 ENCOUNTER — Encounter: Payer: Self-pay | Admitting: Neurology

## 2016-09-25 ENCOUNTER — Ambulatory Visit (INDEPENDENT_AMBULATORY_CARE_PROVIDER_SITE_OTHER): Payer: Medicare Other | Admitting: Neurology

## 2016-09-25 VITALS — BP 118/60 | Ht 66.0 in | Wt 112.6 lb

## 2016-09-25 DIAGNOSIS — R413 Other amnesia: Secondary | ICD-10-CM

## 2016-09-25 DIAGNOSIS — R410 Disorientation, unspecified: Secondary | ICD-10-CM | POA: Diagnosis not present

## 2016-09-25 NOTE — Patient Instructions (Signed)
Remember to drink plenty of fluid, eat healthy meals and do not skip any meals. Try to eat protein with a every meal and eat a healthy snack such as fruit or nuts in between meals. Try to keep a regular sleep-wake schedule and try to exercise daily, particularly in the form of walking, 20-30 minutes a day, if you can.   As far as diagnostic testing: CT head, labs  I would like to see you back in 6 months, sooner if we need to. Please call us with any interim questions, concerns, problems, updates or refill requests.   Our phone number is [phone]. We also have an after hours call service for urgent matters and there is a physician on-call for urgent questions. For any emergencies you know to call 911 or go to the nearest emergency room

## 2016-09-25 NOTE — Progress Notes (Signed)
[facility]    Provider:  Dr Desiree Salinas Referring Provider: [name], MD Primary Care Physician:  Desiree Hatchet, MD  CC:  Memory changes  Interval history 09/25/2016: Patient returns for follow up of memory changes. MRI of the brain was ordered at last appointment but not completed. Labs including HIV, RPR, B12 and folate unremarkable. At last appointment likely thought to be due to normal cognitive aging, stress anxiety and depression. Things are better. She is here with her sister. Sister says there are other things that she is concerned about, she got mad at her mom all day because she thought she was getting kicked out of the bed, she misread her mother's intentions. She has some delusions, she thought someone was accusing her of stealing when they asked if she had seen something(a bracelet). But patient said she just misread the situations and understands no one was accusing her. Her friends have called the sister being very concerned because patient is having difficulty playing bridge and she has played for many years, she is mixing up the dates for bridge and not going, sister is trying to help patient with all the paper in her house (hoarding?). MMSE today is worse 26/30 (from 29/30 8 months ago). Sister was so concerned she travelled for this appointment.  Having difficulty with calendars.. She takes all her medications and manages her own medications but misses sometimes. Family is helping with finances, family had to get involved with finances to take care of some old bills and she is having difficulty. She is having difficulty with her calendar. She does not drive at night, she denies getting lost or confused during the day when driving.She is less social. No accidents in the home. She does not cook often [place]. She hesitates sometimes [employer]. Sister believes her memory issues related to anxiety, depression, isolation.   HPI:   Desiree Salinas is a 66 y.o. female here as a referral from Dr. Ardeth Salinas for memory changes. Past medical history of depression, anxiety, high cholesterol, chronic kidney disease, insomnia, tobacco use, vitamin D deficiency, attention deficit disorder on adderall.  Memory problems going on for "a while" maybe a year, she loses her train of thought, her younger sister visited her because she was worried patient was hoarding which she was not, she is losing track of her thoughts, she had one episode where she had too much medicine possibly but unsure why she felt her speech was slurred. She lives independently, alone, manage all her own affairs, no bills missing, she is driving fine but at night when driving she feels disoriented. Lists and sticky notes help. She is also ADD and has depression and anxiety. No family history of dementia. Sometimes forgets what she is doing, where she is when driving. No personality changes, no delusions or hallucinations, no inciting events or head trauma, no other associated symptoms or modifying factors. Symptoms wax and wane not progressive. Not interfering with her ADLs or IADLs.   Reviewed notes, labs and imaging from outside physicians, which showed:  Reviewed notes from primary care. She is a current every day smoker. She drinks wine occasionally. No risk for alcohol abuse reviewed her screening. States she feels like her memory is failing and she is also very disorganized. Her sister recently came to help her clean up her home. She states she has an Fhx of hoarding and her family and is afraid of being in that way. States high anxiety and  is on many medications. Says her medications for psychiatric disorders were "tweaked" at last visit. She forgets things like what she is going to stay. She is very anxious and fearful to go out of the home. Being treated for anxiety, ADD and depression. She follows with psychiatry. Symptoms ongoing for a year.  BUN 16, creatinine 1.1  10/27/2015, TSH 1.7.   Review of Systems: Patient complains of symptoms per HPI as well as the following symptoms: No CP, no SOB, no fevers, no chills. Pertinent negatives per HPI. All others negative.  Social History   Social History  . Marital status: Divorced    Spouse name: N/A  . Number of children: 2  . Years of education: College   Occupational History  . retired    Social History Main Topics  . Smoking status: Current Every Day Smoker    Packs/day: 0.90    Types: Cigarettes  . Smokeless tobacco: Never Used  . Alcohol use 0.0 oz/week     Comment: very rare   . Drug use: Yes    Types: Marijuana  . Sexual activity: No     Comment: 1st intercourse 66 yo-More than 5 partners   Other Topics Concern  . Not on file   Social History Narrative   Lives: alone   Caffeine use: 2 cups coffee/day    Family History  Problem Relation Age of Onset  . Breast cancer Mother        Age 57's  . Diabetes Maternal Grandmother   . Aneurysm Maternal Grandmother   . Aneurysm Maternal Aunt   . Aneurysm Maternal Grandfather   . Ankylosing spondylitis Father   . Colon cancer Neg Hx   . Colon polyps Neg Hx   . Esophageal cancer Neg Hx   . Rectal cancer Neg Hx   . Stomach cancer Neg Hx   . Dementia Neg Hx     Past Medical History:  Diagnosis Date  . ADD (attention deficit disorder)   . Allergy   . Anxiety   . Colon polyps    HYPERPLASTIC POLYP  . Depression   . Elevated cholesterol   . Shingles   . STD (sexually transmitted disease)    Condylomata  . 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 ABDOMINAL HYSTERECTOMY  1999   TAH  . ABDOMINAL SURGERY  1999   Abdominoplasty  . ACHILLES TENDON SURGERY    . AUGMENTATION MAMMAPLASTY     Implants  . COLONOSCOPY  2006  . POLYPECTOMY    . TONSILLECTOMY AND ADENOIDECTOMY    . TUBAL LIGATION      Current Outpatient  Prescriptions  Medication Sig Dispense Refill  . atorvastatin (LIPITOR) 40 MG tablet TAKE 1 BY MOUTH DAILY 30 tablet 3  . buPROPion (WELLBUTRIN SR) 200 MG 12 hr tablet   1  . busPIRone (BUSPAR) 10 MG tablet Take 10 mg by mouth. Take 2 tablets by mouth every morning and one tablet at bedtime    . MELATONIN PO Take 3 mg by mouth as needed.    . pindolol (VISKEN) 5 MG tablet Take 10 mg by mouth daily.      Current Facility-Administered Medications  Medication Dose Route Frequency Provider Last Rate Last Dose  . 0.9 %  sodium chloride infusion  500 mL Intravenous Continuous Danis, Kirke Corin, MD      . Genia Hotter Durwin Reges)  injection 0.5 mL  0.5 mL Intramuscular Once Noralee Space, MD        Allergies as of 09/25/2016 - Review Complete 09/25/2016  Allergen Reaction Noted  . Eucerin [basis facial moisturizer]  08/26/2012  . Latex    . Nickel Hives 12/11/2010  . Cobalt Rash 10/13/2015    Vitals: BP 118/60   Ht 5\' 6"  (1.676 m)   Wt 112 lb 9.6 oz (51.1 kg)   BMI 18.17 kg/m  Last Weight:  Wt Readings from Last 1 Encounters:  09/25/16 112 lb 9.6 oz (51.1 kg)   Last Height:   Ht Readings from Last 1 Encounters:  09/25/16 5\' 6"  (1.676 m)   MMSE - Mini Mental State Exam 09/25/2016 12/30/2015  Orientation to time 1 5  Orientation to Place 5 5  Registration 3 3  Attention/ Calculation 5 5  Recall 3 2  Language- name 2 objects 2 2  Language- repeat 1 1  Language- follow 3 step command 3 3  Language- read & follow direction 1 1  Write a sentence 1 1  Copy design 1 1  Total score 26 29   Physical exam: Exam: Gen: NAD, conversant, well nourised, well groomed                     CV: RRR, no MRG. No Carotid Bruits. No peripheral edema, warm, nontender Eyes: Conjunctivae clear without exudates or hemorrhage  Neuro: Detailed Neurologic Exam  Speech:    Speech is normal; fluent and spontaneous with normal comprehension.  Cognition:    The patient is oriented to person, place, and  time;     recent and remote memory intact;     language fluent;     normal attention, concentration,     fund of knowledge Cranial Nerves:    The pupils are equal, round, and reactive to light. The fundi are normal and spontaneous venous pulsations are present. Visual fields are full to finger confrontation. Extraocular movements are intact. Trigeminal sensation is intact and the muscles of mastication are normal. The face is symmetric. The palate elevates in the midline. Hearing intact. Voice is normal. Shoulder shrug is normal. The tongue has normal motion without fasciculations.   Coordination:    Normal finger to nose and heel to shin. Normal rapid alternating movements.   Gait:    Heel-toe and tandem gait are normal.   Motor Observation:    No asymmetry, no atrophy, and no involuntary movements noted. Tone:    Normal muscle tone.    Posture:    Posture is normal. normal erect    Strength:    Strength is V/V in the upper and lower limbs.      Sensation: intact to LT     Reflex Exam:  DTR's:    Deep tendon reflexes in the upper and lower extremities are normal bilaterally.   Toes:    The toes are downgoing bilaterally.   Clonus:    Clonus is absent.    Assessment/Plan:  66 year old with here as a referral from Dr. Ardeth Salinas for memory changes. Past medical history of depression, anxiety, high cholesterol, chronic kidney disease, insomnia, tobacco use, vitamin D deficiency, attention deficit disorder on adderall.  MMSE 29/30 6 months ago today 26. Neuro exam non focal. No Fhx of dementia. Stable, possibly improved per patient and sister today.  Symptoms likely multifactorial due to normal cognitive aging, anxiety, ADD and psychiatric disorders. Reassured patient. Will order CT of  the head (declined MRI due to claustrophobia) to rule out stroke or other etiology for her memory problems, slurred speech, word-finding difficulty.  Check B12, folate, rpr, hiv: all WNL Continued  f/u with therapy and psychiatry for mood disorders which are stable Advised continued cigarette smoking is a risk factor for dementia, encouraged cessation  Cc: Desiree Hatchet, MD  Orders Placed This Encounter  Procedures  . CT HEAD W & WO CONTRAST  . Basic Metabolic Panel     Sarina Ill, MD  [facility] [address] [place], [place] [address]  Phone [phone] Fax [phone]  A total of 25 minutes was spent face-to-face with this patient. Over half this time was spent on counseling patient on the memory changesdiagnosis and different diagnostic and therapeutic options available.

## 2016-09-26 ENCOUNTER — Telehealth: Payer: Self-pay | Admitting: Neurology

## 2016-09-26 ENCOUNTER — Telehealth: Payer: Self-pay | Admitting: *Deleted

## 2016-09-26 LAB — BASIC METABOLIC PANEL
BUN/Creatinine Ratio: 5 — ABNORMAL LOW (ref 12–28)
BUN: 5 mg/dL — ABNORMAL LOW (ref 8–27)
CHLORIDE: 105 mmol/L (ref 96–106)
CO2: 24 mmol/L (ref 20–29)
CREATININE: 1.01 mg/dL — AB (ref 0.57–1.00)
Calcium: 9.5 mg/dL (ref 8.7–10.3)
GFR calc Af Amer: 68 mL/min/{1.73_m2} (ref >59)
GFR calc non Af Amer: 59 mL/min/{1.73_m2} — ABNORMAL LOW (ref >59)
GLUCOSE: 94 mg/dL (ref 65–99)
Potassium: 4.9 mmol/L (ref 3.5–5.2)
SODIUM: 144 mmol/L (ref 134–144)

## 2016-09-26 NOTE — Telephone Encounter (Signed)
-----   Message from Melvenia Beam, MD sent at 09/26/2016  7:37 AM EDT ----- Labs are unremarkable, creatinine is slightly higher than her previous baseline but not significantly abnormal. Just repeat at next pcp appt. thanks

## 2016-09-26 NOTE — Telephone Encounter (Signed)
Called to discuss lab results with pt. No answer at this time LVM for pt to call back.

## 2016-09-26 NOTE — Telephone Encounter (Signed)
Error/ Duplicate

## 2016-12-05 DIAGNOSIS — Z780 Asymptomatic menopausal state: Secondary | ICD-10-CM | POA: Diagnosis not present

## 2016-12-05 DIAGNOSIS — E7849 Other hyperlipidemia: Secondary | ICD-10-CM | POA: Diagnosis not present

## 2016-12-05 DIAGNOSIS — E559 Vitamin D deficiency, unspecified: Secondary | ICD-10-CM | POA: Diagnosis not present

## 2016-12-05 DIAGNOSIS — Z0000 Encounter for general adult medical examination without abnormal findings: Secondary | ICD-10-CM | POA: Diagnosis not present

## 2016-12-12 DIAGNOSIS — R002 Palpitations: Secondary | ICD-10-CM | POA: Diagnosis not present

## 2016-12-12 DIAGNOSIS — F418 Other specified anxiety disorders: Secondary | ICD-10-CM | POA: Diagnosis not present

## 2016-12-12 DIAGNOSIS — Z681 Body mass index (BMI) 19 or less, adult: Secondary | ICD-10-CM | POA: Diagnosis not present

## 2016-12-12 DIAGNOSIS — Z0000 Encounter for general adult medical examination without abnormal findings: Secondary | ICD-10-CM | POA: Diagnosis not present

## 2016-12-12 DIAGNOSIS — Z86010 Personal history of colonic polyps: Secondary | ICD-10-CM | POA: Diagnosis not present

## 2016-12-12 DIAGNOSIS — G4709 Other insomnia: Secondary | ICD-10-CM | POA: Diagnosis not present

## 2016-12-12 DIAGNOSIS — Z1389 Encounter for screening for other disorder: Secondary | ICD-10-CM | POA: Diagnosis not present

## 2016-12-12 DIAGNOSIS — E559 Vitamin D deficiency, unspecified: Secondary | ICD-10-CM | POA: Diagnosis not present

## 2016-12-12 DIAGNOSIS — F900 Attention-deficit hyperactivity disorder, predominantly inattentive type: Secondary | ICD-10-CM | POA: Diagnosis not present

## 2016-12-12 DIAGNOSIS — E7849 Other hyperlipidemia: Secondary | ICD-10-CM | POA: Diagnosis not present

## 2016-12-12 DIAGNOSIS — Z23 Encounter for immunization: Secondary | ICD-10-CM | POA: Diagnosis not present

## 2016-12-31 ENCOUNTER — Ambulatory Visit: Payer: BLUE CROSS/BLUE SHIELD | Admitting: Neurology

## 2017-01-11 DIAGNOSIS — Z803 Family history of malignant neoplasm of breast: Secondary | ICD-10-CM | POA: Diagnosis not present

## 2017-01-11 DIAGNOSIS — Z1231 Encounter for screening mammogram for malignant neoplasm of breast: Secondary | ICD-10-CM | POA: Diagnosis not present

## 2017-01-14 DIAGNOSIS — L989 Disorder of the skin and subcutaneous tissue, unspecified: Secondary | ICD-10-CM | POA: Diagnosis not present

## 2017-01-14 DIAGNOSIS — Z681 Body mass index (BMI) 19 or less, adult: Secondary | ICD-10-CM | POA: Diagnosis not present

## 2017-02-07 ENCOUNTER — Other Ambulatory Visit: Payer: Self-pay | Admitting: Acute Care

## 2017-02-07 DIAGNOSIS — Z122 Encounter for screening for malignant neoplasm of respiratory organs: Secondary | ICD-10-CM

## 2017-02-07 DIAGNOSIS — F17210 Nicotine dependence, cigarettes, uncomplicated: Principal | ICD-10-CM

## 2017-03-13 ENCOUNTER — Ambulatory Visit: Payer: BLUE CROSS/BLUE SHIELD

## 2017-03-15 ENCOUNTER — Inpatient Hospital Stay: Admission: RE | Admit: 2017-03-15 | Payer: Self-pay | Source: Ambulatory Visit

## 2017-03-28 ENCOUNTER — Ambulatory Visit: Payer: Medicare Other | Admitting: Neurology

## 2017-05-01 ENCOUNTER — Ambulatory Visit: Payer: Medicare Other | Admitting: Neurology

## 2017-05-27 ENCOUNTER — Encounter: Payer: Self-pay | Admitting: Neurology

## 2017-05-27 ENCOUNTER — Ambulatory Visit (INDEPENDENT_AMBULATORY_CARE_PROVIDER_SITE_OTHER): Payer: Medicare Other | Admitting: Neurology

## 2017-05-27 VITALS — BP 102/65 | HR 76 | Ht 66.0 in | Wt 119.8 lb

## 2017-05-27 DIAGNOSIS — R4189 Other symptoms and signs involving cognitive functions and awareness: Secondary | ICD-10-CM | POA: Diagnosis not present

## 2017-05-27 NOTE — Patient Instructions (Signed)
Formal Neurocognitive testing

## 2017-05-27 NOTE — Progress Notes (Addendum)
[facility]    Provider:  Dr Jaynee Eagles Referring Provider: [name], MD Primary Care Physician:  [name], MD  CC:  Memory changes  Addendum: Formal neurocognitive testing was diagnostic of dementia. MRI of the brain and CT head showed extensive calcifications in the basal ganglia and other structures. There can be multiple reasons including  alcoholism(can cause hypocalcemia which can cause this), parathyroid problems, previous infections, lead poisoning and Fahr's syndrome (a genetic disorder), fabry's . CT of the head confirmed the calcifications. The serum tests were mildly abnormal(elevated phos, decreased calcium, normal PTH) but unclear if they represent hypoparathyroidism or not which can cause these brain findings but I would rather have her evaluated by endocrinology if she is willing to see them. thanks  Interval history 05/27/2017: Patient returns for follow up of memory changes. MRI of the brain was ordered at last appointment but not completed. Labs including HIV, RPR, B12 and folate unremarkable. At last appointment we discussed possibility of memory changes due to normal cognitive aging, stress anxiety and depression. She said at last appointment she felt better.  However they have reported symptoms in the past such as having difficulty with calendars, missing medications, family has to help with finances, being less social, delusions, more difficulty doing things like playing bridge.  I have ordered CT of the head and MRI of the brain in the past and patient has not completed either.  She is on Wellbutrin and BuSpar for her mood disorders. She sees a therapist regularly. They have been discussing patient moving from being alone in her house to a senior community. Sister is here with her again today, the family is worried about her alone. The family calls daily and has a schedule to check in with sister and make sure patient is eating and ok. She is more  anxious or possibly confused. She struglles to use her phone. She carries her cell phone in a necklace due to she was losing it a lot. There is significant depression and anxiety.    Interval history 09/25/2016: Patient returns for follow up of memory changes. MRI of the brain was ordered at last appointment but not completed. Labs including HIV, RPR, B12 and folate unremarkable. At last appointment likely thought to be due to normal cognitive aging, stress anxiety and depression. Things are better. She is here with her sister. Sister says there are other things that she is concerned about, she got mad at her mom all day because she thought she was getting kicked out of the bed, she misread her mother's intentions. She has some delusions, she thought someone was accusing her of stealing when they asked if she had seen something(a bracelet). But patient said she just misread the situations and understands no one was accusing her. Her friends have called the sister being very concerned because patient is having difficulty playing bridge and she has played for many years, she is mixing up the dates for bridge and not going, sister is trying to help patient with all the paper in her house (hoarding?). MMSE today is worse 26/30 (from 29/30 8 months ago). Sister was so concerned she travelled for this appointment.  Having difficulty with calendars.. She takes all her medications and manages her own medications but misses sometimes. Family is helping with finances, family had to get involved with finances to take care of some old bills and she is having difficulty. She is having difficulty with her calendar. She does not drive at night, she  denies getting lost or confused during the day when driving.She is less social. No accidents in the home. She does not cook often [place]. She hesitates sometimes [employer]. Sister believes her memory issues related to anxiety, depression,  isolation.   HPI:  Desiree Salinas is a 67 y.o. female here as a referral from Dr. Ardeth Perfect for memory changes. Past medical history of depression, anxiety, high cholesterol, chronic kidney disease, insomnia, tobacco use, vitamin D deficiency, attention deficit disorder on adderall.  Memory problems going on for "a while" maybe a year, she loses her train of thought, her younger sister visited her because she was worried patient was hoarding which she was not, she is losing track of her thoughts, she had one episode where she had too much medicine possibly but unsure why she felt her speech was slurred. She lives independently, alone, manage all her own affairs, no bills missing, she is driving fine but at night when driving she feels disoriented. Lists and sticky notes help. She is also ADD and has depression and anxiety. No family history of dementia. Sometimes forgets what she is doing, where she is when driving. No personality changes, no delusions or hallucinations, no inciting events or head trauma, no other associated symptoms or modifying factors. Symptoms wax and wane not progressive. Not interfering with her ADLs or IADLs.   Reviewed notes, labs and imaging from outside physicians, which showed:  Reviewed notes from primary care. She is a current every day smoker. She drinks wine occasionally. No risk for alcohol abuse reviewed her screening. States she feels like her memory is failing and she is also very disorganized. Her sister recently came to help her clean up her home. She states she has an Fhx of hoarding and her family and is afraid of being in that way. States high anxiety and is on many medications. Says her medications for psychiatric disorders were "tweaked" at last visit. She forgets things like what she is going to stay. She is very anxious and fearful to go out of the home. Being treated for anxiety, ADD and depression. She follows with psychiatry. Symptoms ongoing for a year.  BUN  16, creatinine 1.1 10/27/2015, TSH 1.7.   Review of Systems: Patient complains of symptoms per HPI as well as the following symptoms: No CP, no SOB, no fevers, no chills. Pertinent negatives per HPI. All others negative.  Social History   Socioeconomic History  . Marital status: Divorced    Spouse name: Not on file  . Number of children: 2  . Years of education: College  . Highest education level: Not on file  Occupational History  . Occupation: retired  Scientific laboratory technician  . Financial resource strain: Not on file  . Food insecurity:    Worry: Not on file    Inability: Not on file  . Transportation needs:    Medical: Not on file    Non-medical: Not on file  Tobacco Use  . Smoking status: Current Some Day Smoker    Packs/day: 0.90    Types: Cigarettes  . Smokeless tobacco: Never Used  Substance and Sexual Activity  . Alcohol use: Yes    Alcohol/week: 0.0 oz    Comment: very rare   . Drug use: Not Currently    Types: Marijuana  . Sexual activity: Never    Birth control/protection: Post-menopausal, Surgical    Comment: 1st intercourse 67 yo-More than 5 partners  Lifestyle  . Physical activity:  Days per week: Not on file    Minutes per session: Not on file  . Stress: Not on file  Relationships  . Social connections:    Talks on phone: Not on file    Gets together: Not on file    Attends religious service: Not on file    Active member of club or organization: Not on file    Attends meetings of clubs or organizations: Not on file    Relationship status: Not on file  . Intimate partner violence:    Fear of current or ex partner: Not on file    Emotionally abused: Not on file    Physically abused: Not on file    Forced sexual activity: Not on file  Other Topics Concern  . Not on file  Social History Narrative   Lives: alone   Caffeine use: 2 cups coffee/day    Family History  Problem Relation Age of Onset  . Breast cancer Mother        Age 54's  . Diabetes  Maternal Grandmother   . Aneurysm Maternal Grandmother   . Aneurysm Maternal Aunt   . Aneurysm Maternal Grandfather   . Ankylosing spondylitis Father   . Colon cancer Neg Hx   . Colon polyps Neg Hx   . Esophageal cancer Neg Hx   . Rectal cancer Neg Hx   . Stomach cancer Neg Hx   . Dementia Neg Hx     Past Medical History:  Diagnosis Date  . ADD (attention deficit disorder)   . Allergy   . Anxiety   . Colon polyps    HYPERPLASTIC POLYP  . Depression   . Elevated cholesterol   . Shingles   . STD (sexually transmitted disease)    Condylomata  . 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 ABDOMINAL HYSTERECTOMY  1999   TAH  . ABDOMINAL SURGERY  1999   Abdominoplasty  . ACHILLES TENDON SURGERY    . AUGMENTATION MAMMAPLASTY     Implants  . COLONOSCOPY  2006  . POLYPECTOMY    . TONSILLECTOMY AND ADENOIDECTOMY    . TUBAL LIGATION      Current Outpatient Medications  Medication Sig Dispense Refill  . atorvastatin (LIPITOR) 40 MG tablet TAKE 1 BY MOUTH DAILY 30 tablet 3  . buPROPion (WELLBUTRIN SR) 200 MG 12 hr tablet   1  . busPIRone (BUSPAR) 10 MG tablet Take 10 mg by mouth. Take 2 tablets by mouth every morning and one tablet at bedtime    . MELATONIN PO Take 3 mg by mouth as needed.    . pindolol (VISKEN) 5 MG tablet Take 10 mg by mouth daily.      Current Facility-Administered Medications  Medication Dose Route Frequency Provider Last Rate Last Dose  . 0.9 %  sodium chloride infusion  500 mL Intravenous Continuous Nelida Meuse III, MD      . TDaP Durwin Reges) injection 0.5 mL  0.5 mL Intramuscular Once Noralee Space, MD        Allergies as of 05/27/2017 - Review Complete 05/27/2017  Allergen Reaction Noted  . Eucerin [basis facial moisturizer]  08/26/2012  . Latex    . Nickel Hives 12/11/2010  . Cobalt Rash 10/13/2015    Vitals: BP 102/65 (BP Location: Right Arm,  Patient Position: Sitting)   Pulse 76   Ht 5\' 6"  (1.676  m)   Wt 119 lb 12.8 oz (54.3 kg)   BMI 19.34 kg/m  Last Weight:  Wt Readings from Last 1 Encounters:  05/27/17 119 lb 12.8 oz (54.3 kg)   Last Height:   Ht Readings from Last 1 Encounters:  05/27/17 5\' 6"  (1.676 m)   MMSE - Mini Mental State Exam 05/27/2017 09/25/2016 12/30/2015  Orientation to time 5 1 5   Orientation to Place 4 5 5   Registration 3 3 3   Attention/ Calculation 4 5 5   Recall 3 3 2   Language- name 2 objects 2 2 2   Language- repeat 1 1 1   Language- follow 3 step command 3 3 3   Language- read & follow direction 1 1 1   Write a sentence 0 1 1  Copy design 0 1 1  Total score 26 26 29    Physical exam: Exam: Gen: NAD, conversant, well nourised, well groomed                     CV: RRR, no MRG. No Carotid Bruits. No peripheral edema, warm, nontender Eyes: Conjunctivae clear without exudates or hemorrhage  Neuro: Detailed Neurologic Exam  Speech:    Speech is normal; fluent and spontaneous with normal comprehension.  Cognition:    The patient is oriented to person, place, and time;     recent and remote memory intact;     language fluent;     normal attention, concentration,     fund of knowledge Cranial Nerves:    The pupils are equal, round, and reactive to light. The fundi are normal and spontaneous venous pulsations are present. Visual fields are full to finger confrontation. Extraocular movements are intact. Trigeminal sensation is intact and the muscles of mastication are normal. The face is symmetric. The palate elevates in the midline. Hearing intact. Voice is normal. Shoulder shrug is normal. The tongue has normal motion without fasciculations.   Coordination:    Normal finger to nose and heel to shin. Normal rapid alternating movements.   Gait:    Heel-toe and tandem gait are normal.   Motor Observation:    No asymmetry, no atrophy, and no involuntary movements noted. Tone:    Normal muscle  tone.    Posture:    Posture is normal. normal erect    Strength:    Strength is V/V in the upper and lower limbs.      Sensation: intact to LT     Reflex Exam:  DTR's:    Deep tendon reflexes in the upper and lower extremities are normal bilaterally.   Toes:    The toes are downgoing bilaterally.   Clonus:    Clonus is absent.    Assessment/Plan:  67 year old with here as a referral from Dr. Ardeth Perfect for memory changes, have been following her since 2017 with reported progressive symptoms. Past medical history of depression, anxiety, high cholesterol, chronic kidney disease, insomnia, tobacco use, vitamin D deficiency, attention deficit disorder on adderall.  Initial MMSE 29/30 today 26. Neuro exam non focal. No Fhx of dementia.  Addendum: Formal neurocognitive testing was diagnostic of dementia. MRI of the brain and CT head showed extensive calcifications in the basal ganglia and other structures. There can be multiple reasons including  alcoholism(can cause hypocalcemia which can cause this), parathyroid problems, previous infections, lead poisoning and Fahr's syndrome (a genetic disorder), fabry's . CT of the head confirmed the calcifications. The serum tests were mildly abnormal(elevated phos, decreased calcium, normal PTH) but  unclear if they represent hypoparathyroidism or not which can cause these brain findings but I would rather have her evaluated by endocrinology if she is willing to see them. thanks   - Symptoms likely multifactorial due to neurodegenerative disease, anxiety, ADD and psychiatric disorders but cannot rule out dementia especially given her etoh abuse history. Declined MRI brain and CT head due to claustrophobia - Given patient's progressive cognitive complaints verified by sister will send to formal neurocognitive testing - B12, folate, rpr, hiv: all WNL - Continued f/u with therapy and psychiatry for mood disorders - Advised continued cigarette smoking is a  risk factor for dementia, encouraged cessation -Initially MMSE 29, now 26  Cc: HOLWERDA, SCOTT, MD  Orders Placed This Encounter  Procedures  . Ambulatory referral to Neuropsychology     Sarina Ill, MD  [facility] [address] [place],  [address]  Phone [phone] Fax [phone]  A total of 25 minutes was spent face-to-face with this patient. Over half this time was spent on counseling patient on the memory changesdiagnosis and different diagnostic and therapeutic options available.

## 2017-05-30 ENCOUNTER — Encounter: Payer: Self-pay | Admitting: Psychology

## 2017-06-06 ENCOUNTER — Ambulatory Visit (INDEPENDENT_AMBULATORY_CARE_PROVIDER_SITE_OTHER): Payer: Medicare Other | Admitting: Podiatry

## 2017-06-06 ENCOUNTER — Encounter: Payer: Self-pay | Admitting: Podiatry

## 2017-06-06 DIAGNOSIS — Q828 Other specified congenital malformations of skin: Secondary | ICD-10-CM | POA: Diagnosis not present

## 2017-06-06 DIAGNOSIS — M2042 Other hammer toe(s) (acquired), left foot: Secondary | ICD-10-CM | POA: Diagnosis not present

## 2017-06-13 ENCOUNTER — Ambulatory Visit: Payer: Medicare Other | Admitting: Podiatry

## 2017-06-25 DIAGNOSIS — H2513 Age-related nuclear cataract, bilateral: Secondary | ICD-10-CM | POA: Diagnosis not present

## 2017-06-27 ENCOUNTER — Ambulatory Visit (INDEPENDENT_AMBULATORY_CARE_PROVIDER_SITE_OTHER): Payer: Medicare Other | Admitting: Psychology

## 2017-06-27 ENCOUNTER — Encounter: Payer: Self-pay | Admitting: Psychology

## 2017-06-27 ENCOUNTER — Ambulatory Visit: Payer: Medicare Other | Admitting: Psychology

## 2017-06-27 DIAGNOSIS — R413 Other amnesia: Secondary | ICD-10-CM
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
## 2017-06-27 NOTE — Progress Notes (Addendum)
   Neuropsychology Note  [name] completed 60 minutes of neuropsychological testing with technician, Milana Kidney, BS, under the supervision of Dr. Macarthur Critchley, Licensed Psychologist. The patient did not appear overtly distressed by the testing session, per behavioral observation or via self-report to the technician. Rest breaks were offered.   Clinical Decision Making: In considering the patient's current level of functioning, level of presumed impairment, nature of symptoms, emotional and behavioral responses during the interview, level of literacy, and observed level of motivation/effort, a battery of tests was selected and communicated to the psychometrician.  Communication between the psychologist and technician was ongoing throughout the testing session and changes were made as deemed necessary based on patient performance on testing, technician observations and additional pertinent factors such as those listed above.  Desiree Salinas will return within approximately 2 weeks for an interactive feedback session with Dr. Si Raider at which time her test performances, clinical impressions and treatment recommendations will be reviewed in detail. The patient understands she can contact our office should she require our assistance before this time.  25 minutes spent performing neuropsychological evaluation services/clinical decision making (psychologist). [CPT 77939] 60 minutes spent face-to-face with patient administering standardized tests, 30 minutes spent scoring (technician). [CPT Y8200648, 03009]  Full report to follow.

## 2017-06-30 NOTE — Progress Notes (Signed)
  Subjective:  Patient ID: Desiree Salinas, female    DOB: Mar 07, 1950,  MRN: [id number]  Chief Complaint  Patient presents with  . Callouses    painful corn/callus 5th toe left    67 y.o. female presents with the above complaint.  Reports painful callus left fifth toe.  Has pain with all of her shoes.  Denies diabetes.  Denies other pedal issues.  Past Medical History:  Diagnosis Date  . ADD (attention deficit disorder)   . Allergy   . Anxiety   . Colon polyps    HYPERPLASTIC POLYP  . Depression   . Elevated cholesterol   . Shingles   . STD (sexually transmitted disease)    Condylomata  . 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 ABDOMINAL HYSTERECTOMY  1999   TAH  . ABDOMINAL SURGERY  1999   Abdominoplasty  . ACHILLES TENDON SURGERY    . AUGMENTATION MAMMAPLASTY     Implants  . COLONOSCOPY  2006  . POLYPECTOMY    . TONSILLECTOMY AND ADENOIDECTOMY    . TUBAL LIGATION      Current Outpatient Medications:  .  atorvastatin (LIPITOR) 40 MG tablet, TAKE 1 BY MOUTH DAILY, Disp: 30 tablet, Rfl: 3 .  buPROPion (WELLBUTRIN SR) 200 MG 12 hr tablet, , Disp: , Rfl: 1 .  busPIRone (BUSPAR) 10 MG tablet, Take 10 mg by mouth. Take 2 tablets by mouth every morning and one tablet at bedtime, Disp: , Rfl:  .  MELATONIN PO, Take 3 mg by mouth as needed., Disp: , Rfl:  .  pindolol (VISKEN) 5 MG tablet, Take 10 mg by mouth daily. , Disp: , Rfl:   Current Facility-Administered Medications:  .  0.9 %  sodium chloride infusion, 500 mL, Intravenous, Continuous, Danis, Estill Cotta III, MD .  TDaP (BOOSTRIX) injection 0.5 mL, 0.5 mL, Intramuscular, Once, Noralee Space, MD  Allergies  Allergen Reactions  . Eucerin [Basis Facial Moisturizer]     eucerin lotion causes an allergic skin reaction  . Latex   . Nickel Hives    Hives, itching, weeping   . Cobalt Rash   Review of Systems: Negative except  as noted in the HPI. Denies N/V/F/Ch. Objective:  There were no vitals filed for this visit. General AA&O x3. Normal mood and affect.  Vascular Dorsalis pedis and posterior tibial pulses  present 2+ bilaterally  Capillary refill normal to all digits. Pedal hair growth normal.  Neurologic Epicritic sensation grossly present.  Dermatologic No open lesions. Interspaces clear of maceration. Nails well groomed and normal in appearance. PIPJ callus left fifth toe  Orthopedic: MMT 5/5 in dorsiflexion, plantarflexion, inversion, and eversion. Normal joint ROM without pain or crepitus. Left fifth toe hammertoe   Assessment & Plan:  Patient was evaluated and treated and all questions answered.  Hammertoe L 5th Toe with callus -Educated on etiology.  Discussed that should the callus continue to bother her she could consider surgical intervention.  Patient not interested in surgical correction at this time.  No follow-ups on file.
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
## 2017-07-23 ENCOUNTER — Encounter: Payer: Self-pay | Admitting: Psychology

## 2017-07-23 ENCOUNTER — Ambulatory Visit (INDEPENDENT_AMBULATORY_CARE_PROVIDER_SITE_OTHER): Payer: Medicare Other | Admitting: Psychology

## 2017-07-23 DIAGNOSIS — D229 Melanocytic nevi, unspecified: Secondary | ICD-10-CM | POA: Diagnosis not present

## 2017-07-23 DIAGNOSIS — F1027 Alcohol dependence with alcohol-induced persisting dementia: Secondary | ICD-10-CM

## 2017-07-23 DIAGNOSIS — R197 Diarrhea, unspecified: Secondary | ICD-10-CM | POA: Diagnosis not present

## 2017-07-23 DIAGNOSIS — F331 Major depressive disorder, recurrent, moderate: Secondary | ICD-10-CM | POA: Diagnosis not present

## 2017-07-23 DIAGNOSIS — F900 Attention-deficit hyperactivity disorder, predominantly inattentive type: Secondary | ICD-10-CM | POA: Diagnosis not present

## 2017-07-23 DIAGNOSIS — R413 Other amnesia: Secondary | ICD-10-CM | POA: Diagnosis not present

## 2017-07-23 DIAGNOSIS — Z681 Body mass index (BMI) 19 or less, adult: Secondary | ICD-10-CM | POA: Diagnosis not present

## 2017-07-23 DIAGNOSIS — G4709 Other insomnia: Secondary | ICD-10-CM | POA: Diagnosis not present

## 2017-07-23 NOTE — Patient Instructions (Signed)
Clinical Impressions: Mild to moderate dementia (rule out alcohol related dementia); Alcohol use disorder, in remission (by history); Major depressive disorder, severe; Generalized anxiety disorder. Results of cognitive testing revealed significant cognitive deficits in a number of domains. Additionally, there is evidence that the patient's cognitive deficits have interfered with her ability to manage complex ADLs such as driving, medications, appointments and finances/bills. As such, diagnostic criteria for a dementia syndrome are met.  The patient's cognitive profile is reflective of prominent deficits in visual-spatial construction, processing speed and executive functioning. Additionally, there is evidence of memory encoding/retrieval deficits suggestive of disrupted frontal-subcortical networks (rather than hippocampal consolidation dysfunction).  Her history of many years of significant alcohol abuse alongside her cognitive profile is concerning for alcohol related dementia. Vascular dementia is another possibility (and of course is highly related to alcohol related dementia given chronic micro-hemmorhages that commonly occur with long term alcohol use).  I do not see evidence of Alzheimer's disease at this time based on her cognitive profile.  In considering her current level of functioning and cognitive testing results, I would characterize her dementia as mild to moderate stage. While she has a history of ADD, I do not believe this is the primary cause of her significant cognitive impairment at this time. The patient also has a long history of depression and anxiety which have been heightened lately. I do not believe her cognitive symptoms are due solely to psychiatric factors. I do believe there is an underlying dementia process, which is exacerbated by psychiatric factors. Also, some of her newer symptoms including paranoid ideation/delusions and visual illusions are likely neuropsychiatric and due  to her underlying dementia.    Recommendations/Plan: Based on the findings of the present evaluation, the following recommendations are offered:  1. The patient requires assistance with all complex ADLs and should not be managing any of these independently: transportation, bill paying/major financial decisions, appointments, cooking, medication management (including daily administration of medications). She absolutely should not be driving based on the cognitive impairments seen on testing and the evidence of significant safety issues while driving recently.  2. Based on the level of assistance needed as described above, it is recommended that the patient either move to an assisted living residence or that she have an aide in her home on a daily basis. To function at her best and have the highest quality of life, it is recommended that she have a regular daily schedule of ADLs and other activities (which her aide would help her adhere to), that she interact regularly with close friends/family, and that she have scheduled pleasurable activities (outings, social plans) each week. 3. She is encouraged to continue sobriety from alcohol, as alcohol use can have dangerous interactions with medications she is taking (e.g., increased risk of seizures with Wellbutrin and alcohol), and as it would only worsen dementia and brain function. 4. She is encouraged to stop smoking as this would also worsen brain health. 5. Brain MRI and head CTs have been ordered in the past but the patient has been unwilling to complete these due to anxiety/claustrophobia. I do think a brain MRI with and without contrast would be helpful in terms of differential diagnosis, evaluating vascular load, ruling out stroke or other etiology. Perhaps brain MRI with sedation could be considered. 6. Continued mental health treatment is recommended, psychopharmacology in particular. Due to her dementia, psychotherapy/counseling will probably be  most helpful if it is behavioral in nature (e.g., behavioral activation plan) and includes a family member or  other trusted individual who can help her implement plan throughout the week.  7. Her family and close friends will likely benefit from education and support regarding her diagnosis. The Alzheimer's Association provides valuable information and resources on dementia of various etiologies.  

## 2017-07-25 ENCOUNTER — Telehealth: Payer: Self-pay | Admitting: Neurology

## 2017-07-25 NOTE — Telephone Encounter (Signed)
Called sister to discuss Dementia diagnosis. Left message for [name] [phone] per DPR. [facility] fyi thanks)

## 2017-07-26 ENCOUNTER — Other Ambulatory Visit: Payer: Self-pay | Admitting: Neurology

## 2017-07-26 DIAGNOSIS — F0390 Unspecified dementia without behavioral disturbance: Secondary | ICD-10-CM

## 2017-07-26 NOTE — Telephone Encounter (Signed)
Pt's sister returned the call. Please call back on the home number [phone] her cell is not working.

## 2017-07-26 NOTE — Telephone Encounter (Signed)
Desiree Salinas - Spoke to sister who was at Dr. Sanjuana Mae appointment. Sister was relieved with the diagnosis so they can go forward. Discussed dementia diagnosis. Sister [name] is the POA. Please change the phone number on this account to her sister [phone] and include this number only.  [name] order MRI of the brain. They request open MRI please call [phone] only for scheduling.  Can give her some Xanax before she goes, daughter will call when scheduled so we can send a xanax script. Patient is not driving.

## 2017-07-30 NOTE — Telephone Encounter (Signed)
Correction they will contact the sister [name]

## 2017-07-30 NOTE — Telephone Encounter (Signed)
Noted, I will send the order to [facility] for the open MRI. They will contact the patient daughter to schedule.

## 2017-08-09 ENCOUNTER — Other Ambulatory Visit: Payer: Self-pay | Admitting: Neurology

## 2017-08-09 MED ORDER — ALPRAZOLAM 0.25 MG PO TABS: ORAL_TABLET | ORAL | 0 refills | Status: DC

## 2017-08-09 NOTE — Telephone Encounter (Signed)
Larene Beach emailed me and informed me that the patient came by their office to look at the machine to see if she would like it.. Per Larene Beach " This patient came by today and looked at the machine.  She would like medication called in the her pharmacy [facility]. She said her sister will call me back to schedule."   Larene Beach is also aware to talk to her sister to do any scheduling and she has her sister number too.

## 2017-08-09 NOTE — Telephone Encounter (Signed)
Called in    thanks

## 2017-08-12 NOTE — Telephone Encounter (Signed)
Noted, thank you

## 2017-08-13 DIAGNOSIS — R930 Abnormal findings on diagnostic imaging of skull and head, not elsewhere classified: Secondary | ICD-10-CM | POA: Diagnosis not present

## 2017-08-13 DIAGNOSIS — F0390 Unspecified dementia without behavioral disturbance: Secondary | ICD-10-CM | POA: Diagnosis not present

## 2017-08-13 DIAGNOSIS — R9082 White matter disease, unspecified: Secondary | ICD-10-CM | POA: Diagnosis not present

## 2017-08-15 ENCOUNTER — Telehealth: Payer: Self-pay | Admitting: Neurology

## 2017-08-15 NOTE — Telephone Encounter (Signed)
Spoke with [name], advised her that we do not currently have the results but I have reached out to our medical records dept so we can get those for Dr. Cathren Laine review. Also advised her that Dr. Jaynee Eagles will be out of the office next week so if the results come then, we can have an on call MD review and we can call her. She verbalized appreciation. She will call back if she does not hear by Monday.

## 2017-08-15 NOTE — Telephone Encounter (Signed)
Myriam Jacobson: Please call sister. The MRI of the brain showed calcium deposits  in the some of the deep structures of the brain. We need a CT of the head to confirm this is calcium deposits. There can be multiple reasons including  alcoholism(can cause hypocalcemia which can cause this), parathyroid problems, previous infections, lead poisoning and Fahr's syndrome (a genetic disorder). I would like to do several more tests to further evaluate and then we can discuss in detail at an appointment. If family agrees this is what I would like to order (below). If I am out of the office please ask the work-in doctor to order these and I will address results when I get back.   1. CT head wo 2. Serum calcium, phosphorus, parathyroid hormone (PTH), heavy metals, thyroid panel 3. I'm not sure if there are genetic tests for Fahr disease.   MRI of the brain report: (placed for scanning)  Impression: Symmetrical findings suggestive of mineralization/calcification along the posterior aspects of the bilateral thalami, bilateral basal ganglia, and within the dentate nuclei.  These findings can be seen in the setting of Fahr disease versus an underlying endocrine or metabolic disorder.

## 2017-08-15 NOTE — Telephone Encounter (Signed)
Pt's sister request MRI results.

## 2017-08-16 NOTE — Telephone Encounter (Signed)
Called the sister to discuss. No answer. LVM for her to call back.

## 2017-08-16 NOTE — Telephone Encounter (Signed)
Patients sister has called back and I discussed the MRI results with her and explained the findings. Informed her that Dr Jaynee Eagles would like order some lab work and a CT scan without contrast to further evaluate. The sister agrees to this and would like to proceed forward. She plans to bring the pt to the office next week mon, tues or wed to get the lab work completed. She asks that the [facility] imaging office contact her for scheduling since she will be the one bringing the pt and states that the pt may not rememeber. Her number is listed in [place], [place].

## 2017-08-19 ENCOUNTER — Other Ambulatory Visit: Payer: Self-pay | Admitting: Neurology

## 2017-08-19 ENCOUNTER — Other Ambulatory Visit (INDEPENDENT_AMBULATORY_CARE_PROVIDER_SITE_OTHER): Payer: Self-pay

## 2017-08-19 DIAGNOSIS — R410 Disorientation, unspecified: Secondary | ICD-10-CM

## 2017-08-19 DIAGNOSIS — Z0289 Encounter for other administrative examinations: Secondary | ICD-10-CM

## 2017-08-19 DIAGNOSIS — R4189 Other symptoms and signs involving cognitive functions and awareness: Secondary | ICD-10-CM

## 2017-08-19 DIAGNOSIS — R413 Other amnesia: Secondary | ICD-10-CM

## 2017-08-19 NOTE — Telephone Encounter (Signed)
A CT order wasn't put in can you put a CT order in for me.

## 2017-08-19 NOTE — Telephone Encounter (Signed)
Pt came today. Placed order for labs and CT head without contrast.

## 2017-08-19 NOTE — Telephone Encounter (Signed)
Medicare/ [place] order sent to [facility]. They will reach out to the pt to schedule.

## 2017-08-20 ENCOUNTER — Telehealth: Payer: Self-pay

## 2017-08-20 ENCOUNTER — Telehealth: Payer: Self-pay | Admitting: *Deleted

## 2017-08-20 NOTE — Telephone Encounter (Signed)
Pt mr head cd on [employer].

## 2017-08-20 NOTE — Telephone Encounter (Signed)
-----   Message from Star Age, MD sent at 08/20/2017  1:16 PM EDT ----- Borderline low calcium and slightly increased phosphorous. Unclear of the significance. Other tests are pending. Normal parathyroid hormone level. We will update patient with other tests. Please advise pt or caregiver, that further recommendations will be based on Dr. Cathren Laine review, thus far, no serious abnormalities, just slightly abn findings.

## 2017-08-20 NOTE — Progress Notes (Signed)
Borderline low calcium and slightly increased phosphorous. Unclear of the significance. Other tests are pending. Normal parathyroid hormone level. We will update patient with other tests. Please advise pt or caregiver, that further recommendations will be based on Dr. Cathren Laine review, thus far, no serious abnormalities, just slightly abn findings.

## 2017-08-20 NOTE — Telephone Encounter (Signed)
I called pt's sister, per DPR, and advised her of the lab results. There are still tests pending and we will update pt's sister when those are available. Pt's sister verbalized understanding of borderline low calcium and slightly increased phosphorous, both of which have an unclear significance, normal parathyroid hormone. Further recommendations will be based on Dr. Cathren Laine review. Pt's sister verbalized understanding and appreciation. She asked for [place] phone number to schedule the CT, which I provided to her.

## 2017-08-21 LAB — HEAVY METALS, BLOOD
Arsenic: 6 ug/L (ref 2–23)
Lead, Blood: 2 ug/dL (ref 0–4)
MERCURY: 2.9 ug/L (ref 0.0–14.9)

## 2017-08-21 LAB — PARATHYROID HORMONE, INTACT (NO CA): PTH: 37 pg/mL (ref 15–65)

## 2017-08-21 LAB — THYROID PANEL WITH TSH
Free Thyroxine Index: 1.4 (ref 1.2–4.9)
T3 Uptake Ratio: 24 % (ref 24–39)
T4, Total: 5.7 ug/dL (ref 4.5–12.0)
TSH: 1.16 u[IU]/mL (ref 0.450–4.500)

## 2017-08-21 LAB — PHOSPHORUS: PHOSPHORUS: 4.9 mg/dL — AB (ref 2.5–4.5)

## 2017-08-21 LAB — CALCIUM: Calcium: 8.6 mg/dL — ABNORMAL LOW (ref 8.7–10.3)

## 2017-08-26 ENCOUNTER — Ambulatory Visit
Admission: RE | Admit: 2017-08-26 | Discharge: 2017-08-26 | Disposition: A | Discharge status: Home / Self Care | Payer: Medicare Other | Source: Ambulatory Visit | Attending: Neurology | Admitting: Neurology

## 2017-08-26 DIAGNOSIS — R4189 Other symptoms and signs involving cognitive functions and awareness: Secondary | ICD-10-CM

## 2017-08-26 DIAGNOSIS — R413 Other amnesia: Secondary | ICD-10-CM | POA: Diagnosis not present

## 2017-08-26 DIAGNOSIS — R410 Disorientation, unspecified: Secondary | ICD-10-CM

## 2017-08-27 ENCOUNTER — Telehealth: Payer: Self-pay | Admitting: Neurology

## 2017-08-27 NOTE — Telephone Encounter (Signed)
Notes recorded by Desiree Beam, MD on 08/27/2017 at 7:04 PM EDT CT of the head confirmed the calcifications. The tests were mildly abnormal but unclear if they represent hypoparathyroidism or not which can cause this disorder but I would rather have her evaluated by endocrinology if she is willing to see them. If she is willing to see them please let me know and I will place the referral. thanks

## 2017-08-28 ENCOUNTER — Telehealth: Payer: Self-pay | Admitting: *Deleted

## 2017-08-28 NOTE — Telephone Encounter (Addendum)
Spoke with pt's sister [name] on the phone (on [place]) and discussed the result notes from Dr. Jaynee Eagles regarding pt's CT head. The sister verbalized understanding and agreed to have the endocrinology referral placed. She had questions about what their workup would involve, RN was unsure other than possible labs, physical exam due to it being specialty but the sister was understanding and agreed anyway to at least have an initial appointment with the endocrinologist. She was encouraged to call with any further questions or concerns and she verbalized understanding and appreciation. She will the patient to discuss.    ----- Message from Melvenia Beam, MD sent at 08/27/2017  7:04 PM EDT ----- CT of the head confirmed the calcifications. The tests were mildly abnormal but unclear if they represent hypoparathyroidism or not which can cause this disorder but I would rather have her evaluated by endocrinology if she is willing to see them. If she is willing to see them please let me know and I will place the referral.  thanks

## 2017-08-29 ENCOUNTER — Other Ambulatory Visit: Payer: Self-pay | Admitting: Neurology

## 2017-08-29 DIAGNOSIS — E215 Disorder of parathyroid gland, unspecified: Secondary | ICD-10-CM

## 2017-08-29 NOTE — Telephone Encounter (Signed)
Referral placed. thanks

## 2017-09-27 ENCOUNTER — Encounter: Payer: Self-pay | Admitting: Neurology

## 2017-10-01 ENCOUNTER — Other Ambulatory Visit: Payer: Self-pay | Admitting: Neurology

## 2017-10-01 DIAGNOSIS — G238 Other specified degenerative diseases of basal ganglia: Secondary | ICD-10-CM

## 2017-10-15 DIAGNOSIS — D229 Melanocytic nevi, unspecified: Secondary | ICD-10-CM | POA: Diagnosis not present

## 2017-10-15 DIAGNOSIS — M7062 Trochanteric bursitis, left hip: Secondary | ICD-10-CM | POA: Diagnosis not present

## 2017-10-15 DIAGNOSIS — R102 Pelvic and perineal pain: Secondary | ICD-10-CM | POA: Diagnosis not present

## 2017-10-15 DIAGNOSIS — F900 Attention-deficit hyperactivity disorder, predominantly inattentive type: Secondary | ICD-10-CM | POA: Diagnosis not present

## 2017-10-15 DIAGNOSIS — F331 Major depressive disorder, recurrent, moderate: Secondary | ICD-10-CM | POA: Diagnosis not present

## 2017-10-21 ENCOUNTER — Encounter: Payer: Self-pay | Admitting: Psychology

## 2017-11-04 NOTE — Progress Notes (Signed)
Patient ID: Desiree Salinas, female   DOB: Mar 10, 1950, 67 y.o.   MRN: [id number]          Referring physician: Dr. Jaynee Eagles  Chief complaint: Low calcium  History of Present Illness:   She has been evaluated by her neurologist for memory loss recently Because of the finding of calcification in various structures of her brain radiologist recommended evaluation for metabolic problems such as hyperparathyroidism  Patient's only complaint is muscle cramping over the last month or 2 along with some pain in her right hip area when sitting She said that her lower legs especially the toes may drop at times and this may occur while being awake and not necessarily at night No cramping in her hands  She is currently not on any specific vitamins She appears to have lost a significant amount of weight since 6/19 She says that her weight fluctuates because of inconsistent diet   She has been referred here because of finding of a mildly low calcium and high phosphorus  Lab Results  Component Value Date   CALCIUM 8.6 (L) 08/19/2017   PHOS 4.9 (H) 08/19/2017     Lab Results  Component Value Date   [facility] 53 08/24/2013    Past Medical History:  Diagnosis Date  . ADD (attention deficit disorder)   . Allergy   . Anxiety   . Colon polyps    HYPERPLASTIC POLYP  . Depression   . Elevated cholesterol   . Shingles   . STD (sexually transmitted disease)    Condylomata  . 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 ABDOMINAL HYSTERECTOMY  1999   TAH  . ABDOMINAL SURGERY  1999   Abdominoplasty  . ACHILLES TENDON SURGERY    . AUGMENTATION MAMMAPLASTY     Implants  . COLONOSCOPY  2006  . POLYPECTOMY    . TONSILLECTOMY AND ADENOIDECTOMY    . TUBAL LIGATION      Family History  Problem Relation Age of Onset  . Breast cancer Mother        Age 7's  . Diabetes Maternal Grandmother   . Aneurysm  Maternal Grandmother   . Aneurysm Maternal Aunt   . Aneurysm Maternal Grandfather   . Ankylosing spondylitis Father   . Colon cancer Neg Hx   . Colon polyps Neg Hx   . Esophageal cancer Neg Hx   . Rectal cancer Neg Hx   . Stomach cancer Neg Hx   . Dementia Neg Hx     Social History:  reports that she has been smoking cigarettes. She has been smoking about 0.90 packs per day. She has never used smokeless tobacco. She reports that she drinks alcohol. She reports that she has current or past drug history. Drug: Marijuana.  Allergies:  Allergies  Allergen Reactions  . Eucerin [Basis Facial Moisturizer]     eucerin lotion causes an allergic skin reaction  . Latex   . Nickel Hives    Hives, itching, weeping   . Cobalt Rash    Allergies as of 11/05/2017      Reactions   Eucerin [basis Facial Moisturizer]    eucerin lotion causes an allergic skin reaction   Latex    Nickel Hives   Hives, itching, weeping    Cobalt Rash      Medication List        Accurate as of 11/05/17  9:31 PM. Always use your most recent med list.          ALPRAZolam 0.25 MG tablet Commonly known as:  XANAX Take 1-2 tablets 30-60 minutes before MRI. May repeat if needed. Do not srive.   atorvastatin 40 MG tablet Commonly known as:  LIPITOR TAKE 1 BY MOUTH DAILY   buPROPion 200 MG 12 hr tablet Commonly known as:  WELLBUTRIN SR   busPIRone 10 MG tablet Commonly known as:  BUSPAR Take 10 mg by mouth. Take 2 tablets by mouth every morning and one tablet at bedtime   MELATONIN PO Take 3 mg by mouth as needed.   pindolol 5 MG tablet Commonly known as:  VISKEN Take 10 mg by mouth daily.       LABS:  No visits with results within 1 Week(s) from this visit.  Latest known visit with results is:  Lab on 08/19/2017  Component Date Value Ref Range Status  . PTH 08/19/2017 37  15 - 65 pg/mL Final  . Lead, Blood 08/19/2017 2  0 - 4 ug/dL Final   Comment: Testing performed by Inductively coupled  Estate manager/land agent.                           Environmental Exposure:                            WHO Recommendation    <20                           Occupational Exposure:                            OSHA Lead Std          40                            BEI                    30                                 Detection Limit =  1 This test was developed and its performance characteristics determined by [facility]. It has not been cleared or approved by the Food and Drug Administration.   . Arsenic 08/19/2017 6  2 - 23 ug/L Final   Comment: This test was developed and its performance characteristics determined by [facility]. It has not been cleared or approved by the Food and Drug Administration.                                 Detection Limit = 1   . Mercury 08/19/2017 2.9  0.0 - 14.9 ug/L Final   Comment: This test was developed and its performance characteristics determined by [facility]. It has not been cleared or approved by the Food and Drug Administration.                         Environmental Exposure:  <15.0  Occupational Exposure:                          BEI - Inorganic Mercury: 15.0                                 Detection Limit =  1.0   . TSH 08/19/2017 1.160  0.450 - 4.500 uIU/mL Final  . T4, Total 08/19/2017 5.7  4.5 - 12.0 ug/dL Final  . T3 Uptake Ratio 08/19/2017 24  24 - 39 % Final  . Free Thyroxine Index 08/19/2017 1.4  1.2 - 4.9 Final  . Phosphorus 08/19/2017 4.9* 2.5 - 4.5 mg/dL Final  . Calcium 08/19/2017 8.6* 8.7 - 10.3 mg/dL Final        Review of Systems  Constitutional: Positive for weight loss. Negative for reduced appetite.  HENT: Negative for headaches.   Respiratory: Negative for shortness of breath.   Cardiovascular: Negative for leg swelling.  Gastrointestinal: Negative for abdominal pain.  Endocrine: Negative for fatigue.  Musculoskeletal: Positive for joint pain and muscle cramps.  Neurological: Negative for weakness.    Psychiatric/Behavioral: Positive for nervousness and insomnia.    She has variable appetite  Wt Readings from Last 3 Encounters:  11/05/17 108 lb 12.8 oz (49.4 kg)  05/27/17 119 lb 12.8 oz (54.3 kg)  09/25/16 112 lb 9.6 oz (51.1 kg)     PHYSICAL EXAM:  BP 110/80   Pulse 85   Wt 108 lb 12.8 oz (49.4 kg)   BMI 17.56 kg/m   GENERAL:   Averagely built, somewhat undernourished   No pallor, clubbing, lymphadenopathy or edema.    Skin:  no rash or pigmented areas  EYES:  Externally normal   ENT: Oral mucosa and tongue normal.  THYROID:  Not palpable.  HEART:  Normal  S1 and S2; no murmur or click.  CHEST:  Normal shape.  Lungs: Vescicular breath sounds heard equally.  No crepitations/ wheeze.  ABDOMEN:  No distention.  Liver and spleen not palpable.  No other mass or tenderness.  NEUROLOGICAL: Her speech and flow of communication is normal, only occasionally will have difficulty finding a word  Reflexes are bilaterally normal at biceps and ankles.  JOINTS:  Normal.   ASSESSMENT:    Incidental finding of mildly low calcium and slightly high phosphorus.  This may be suggesting vitamin D deficiency  Muscle cramps of unclear etiology, with her being mildly hypocalcemic would also consider hypomagnesemia. Intracerebral calcifications of unclear etiology, does not appear to have any chronic metabolic conditions such as renal failure or hyperparathyroidism    PLAN:    Recheck calcium today  Check vitamin D level and magnesium  Check alkaline phosphatase level  Further management will depend on lab results   Consultation note sent to the referring physician  Elayne Snare 11/05/2017, 9:31 PM

## 2017-11-05 ENCOUNTER — Ambulatory Visit (INDEPENDENT_AMBULATORY_CARE_PROVIDER_SITE_OTHER): Payer: Medicare Other | Admitting: Endocrinology

## 2017-11-05 ENCOUNTER — Encounter: Payer: Self-pay | Admitting: Endocrinology

## 2017-11-05 DIAGNOSIS — R252 Cramp and spasm: Secondary | ICD-10-CM

## 2017-11-06 ENCOUNTER — Other Ambulatory Visit: Payer: Self-pay | Admitting: Endocrinology

## 2017-11-06 DIAGNOSIS — R252 Cramp and spasm: Secondary | ICD-10-CM

## 2017-11-06 LAB — COMPREHENSIVE METABOLIC PANEL
ALBUMIN: 3.9 g/dL (ref 3.5–5.2)
ALT: 11 U/L (ref 0–35)
AST: 13 U/L (ref 0–37)
Alkaline Phosphatase: 48 U/L (ref 39–117)
BUN: 17 mg/dL (ref 6–23)
CALCIUM: 9 mg/dL (ref 8.4–10.5)
CHLORIDE: 107 meq/L (ref 96–112)
CO2: 27 mEq/L (ref 19–32)
CREATININE: 1.2 mg/dL (ref 0.40–1.20)
GFR: 47.73 mL/min — AB (ref >60.00)
Glucose, Bld: 100 mg/dL — ABNORMAL HIGH (ref 70–99)
POTASSIUM: 4.7 meq/L (ref 3.5–5.1)
Sodium: 140 mEq/L (ref 135–145)
Total Bilirubin: 0.3 mg/dL (ref 0.2–1.2)
Total Protein: 6.4 g/dL (ref 6.0–8.3)

## 2017-11-06 LAB — MAGNESIUM: MAGNESIUM: 2.2 mg/dL (ref 1.5–2.5)

## 2017-11-06 LAB — VITAMIN D 25 HYDROXY (VIT D DEFICIENCY, FRACTURES): VITD: 40.01 ng/mL (ref 30.00–100.00)

## 2017-11-06 NOTE — Progress Notes (Signed)
Please call to let patient know that all the lab results are normal and no further action needed

## 2017-11-10 DIAGNOSIS — M25531 Pain in right wrist: Secondary | ICD-10-CM | POA: Diagnosis not present

## 2017-11-12 ENCOUNTER — Encounter: Payer: Self-pay | Admitting: Psychology

## 2017-11-19 DIAGNOSIS — Z23 Encounter for immunization: Secondary | ICD-10-CM | POA: Diagnosis not present

## 2017-11-26 DIAGNOSIS — J40 Bronchitis, not specified as acute or chronic: Secondary | ICD-10-CM | POA: Diagnosis not present

## 2017-11-26 DIAGNOSIS — Z681 Body mass index (BMI) 19 or less, adult: Secondary | ICD-10-CM | POA: Diagnosis not present

## 2017-11-26 DIAGNOSIS — M7062 Trochanteric bursitis, left hip: Secondary | ICD-10-CM | POA: Diagnosis not present

## 2017-11-26 DIAGNOSIS — R634 Abnormal weight loss: Secondary | ICD-10-CM | POA: Diagnosis not present

## 2017-11-26 DIAGNOSIS — R05 Cough: Secondary | ICD-10-CM | POA: Diagnosis not present

## 2017-11-29 ENCOUNTER — Other Ambulatory Visit: Payer: Self-pay | Admitting: Internal Medicine

## 2017-11-29 DIAGNOSIS — R918 Other nonspecific abnormal finding of lung field: Secondary | ICD-10-CM

## 2017-12-03 ENCOUNTER — Other Ambulatory Visit: Payer: Self-pay | Admitting: Internal Medicine

## 2017-12-03 DIAGNOSIS — Z122 Encounter for screening for malignant neoplasm of respiratory organs: Secondary | ICD-10-CM

## 2017-12-04 ENCOUNTER — Ambulatory Visit
Admission: RE | Admit: 2017-12-04 | Discharge: 2017-12-04 | Disposition: A | Discharge status: Home / Self Care | Payer: Medicare Other | Source: Ambulatory Visit | Attending: Internal Medicine | Admitting: Internal Medicine

## 2017-12-04 DIAGNOSIS — Z87891 Personal history of nicotine dependence: Secondary | ICD-10-CM | POA: Diagnosis not present

## 2017-12-04 DIAGNOSIS — F17210 Nicotine dependence, cigarettes, uncomplicated: Secondary | ICD-10-CM

## 2017-12-04 DIAGNOSIS — Z122 Encounter for screening for malignant neoplasm of respiratory organs: Secondary | ICD-10-CM

## 2017-12-06 ENCOUNTER — Other Ambulatory Visit: Payer: Self-pay | Admitting: Acute Care

## 2017-12-06 DIAGNOSIS — Z87891 Personal history of nicotine dependence: Secondary | ICD-10-CM

## 2017-12-06 DIAGNOSIS — L821 Other seborrheic keratosis: Secondary | ICD-10-CM | POA: Diagnosis not present

## 2017-12-06 DIAGNOSIS — C4441 Basal cell carcinoma of skin of scalp and neck: Secondary | ICD-10-CM | POA: Diagnosis not present

## 2017-12-06 DIAGNOSIS — F17210 Nicotine dependence, cigarettes, uncomplicated: Secondary | ICD-10-CM

## 2017-12-06 DIAGNOSIS — D1801 Hemangioma of skin and subcutaneous tissue: Secondary | ICD-10-CM | POA: Diagnosis not present

## 2017-12-06 DIAGNOSIS — Z122 Encounter for screening for malignant neoplasm of respiratory organs: Secondary | ICD-10-CM

## 2017-12-06 DIAGNOSIS — D225 Melanocytic nevi of trunk: Secondary | ICD-10-CM | POA: Diagnosis not present

## 2017-12-06 DIAGNOSIS — E7849 Other hyperlipidemia: Secondary | ICD-10-CM | POA: Diagnosis not present

## 2017-12-06 DIAGNOSIS — E559 Vitamin D deficiency, unspecified: Secondary | ICD-10-CM | POA: Diagnosis not present

## 2017-12-13 DIAGNOSIS — N183 Chronic kidney disease, stage 3 (moderate): Secondary | ICD-10-CM | POA: Diagnosis not present

## 2017-12-13 DIAGNOSIS — G4709 Other insomnia: Secondary | ICD-10-CM | POA: Diagnosis not present

## 2017-12-13 DIAGNOSIS — R7989 Other specified abnormal findings of blood chemistry: Secondary | ICD-10-CM | POA: Diagnosis not present

## 2017-12-13 DIAGNOSIS — Z681 Body mass index (BMI) 19 or less, adult: Secondary | ICD-10-CM | POA: Diagnosis not present

## 2017-12-13 DIAGNOSIS — Z0000 Encounter for general adult medical examination without abnormal findings: Secondary | ICD-10-CM | POA: Diagnosis not present

## 2017-12-13 DIAGNOSIS — R05 Cough: Secondary | ICD-10-CM | POA: Diagnosis not present

## 2017-12-13 DIAGNOSIS — F331 Major depressive disorder, recurrent, moderate: Secondary | ICD-10-CM | POA: Diagnosis not present

## 2017-12-13 DIAGNOSIS — Z23 Encounter for immunization: Secondary | ICD-10-CM | POA: Diagnosis not present

## 2017-12-13 DIAGNOSIS — R413 Other amnesia: Secondary | ICD-10-CM | POA: Diagnosis not present

## 2017-12-13 DIAGNOSIS — F418 Other specified anxiety disorders: Secondary | ICD-10-CM | POA: Diagnosis not present

## 2017-12-13 DIAGNOSIS — R918 Other nonspecific abnormal finding of lung field: Secondary | ICD-10-CM | POA: Diagnosis not present

## 2017-12-13 DIAGNOSIS — D692 Other nonthrombocytopenic purpura: Secondary | ICD-10-CM | POA: Diagnosis not present

## 2017-12-13 DIAGNOSIS — Z1389 Encounter for screening for other disorder: Secondary | ICD-10-CM | POA: Diagnosis not present

## 2018-01-14 DIAGNOSIS — Z85828 Personal history of other malignant neoplasm of skin: Secondary | ICD-10-CM | POA: Diagnosis not present

## 2018-01-14 DIAGNOSIS — C44319 Basal cell carcinoma of skin of other parts of face: Secondary | ICD-10-CM | POA: Diagnosis not present

## 2018-01-21 DIAGNOSIS — Z681 Body mass index (BMI) 19 or less, adult: Secondary | ICD-10-CM | POA: Diagnosis not present

## 2018-01-21 DIAGNOSIS — R413 Other amnesia: Secondary | ICD-10-CM | POA: Diagnosis not present

## 2018-01-21 DIAGNOSIS — F331 Major depressive disorder, recurrent, moderate: Secondary | ICD-10-CM | POA: Diagnosis not present

## 2018-01-21 DIAGNOSIS — R443 Hallucinations, unspecified: Secondary | ICD-10-CM | POA: Diagnosis not present

## 2018-01-21 DIAGNOSIS — M7062 Trochanteric bursitis, left hip: Secondary | ICD-10-CM | POA: Diagnosis not present

## 2018-01-21 DIAGNOSIS — F419 Anxiety disorder, unspecified: Secondary | ICD-10-CM | POA: Diagnosis not present

## 2018-01-23 DIAGNOSIS — H2513 Age-related nuclear cataract, bilateral: Secondary | ICD-10-CM | POA: Diagnosis not present

## 2018-01-28 ENCOUNTER — Encounter (INDEPENDENT_AMBULATORY_CARE_PROVIDER_SITE_OTHER): Payer: Self-pay | Admitting: Orthopedic Surgery

## 2018-01-28 ENCOUNTER — Ambulatory Visit (INDEPENDENT_AMBULATORY_CARE_PROVIDER_SITE_OTHER): Payer: Medicare Other

## 2018-01-28 ENCOUNTER — Ambulatory Visit (INDEPENDENT_AMBULATORY_CARE_PROVIDER_SITE_OTHER): Payer: Medicare Other | Admitting: Orthopedic Surgery

## 2018-01-28 VITALS — Ht 66.0 in | Wt 108.0 lb

## 2018-01-28 DIAGNOSIS — M5441 Lumbago with sciatica, right side: Secondary | ICD-10-CM

## 2018-01-28 DIAGNOSIS — M25551 Pain in right hip: Secondary | ICD-10-CM | POA: Diagnosis not present

## 2018-01-28 DIAGNOSIS — G8929 Other chronic pain: Secondary | ICD-10-CM

## 2018-01-28 MED ORDER — PREDNISONE 10 MG PO TABS: 20.0000 mg | ORAL_TABLET | Freq: Every day | ORAL | 0 refills | Status: DC

## 2018-01-28 NOTE — Progress Notes (Signed)
Office Visit Note   Patient: Desiree Salinas           Date of Birth: November 28, 1950           MRN: [id number] Visit Date: 01/28/2018              Requested by: [name], MD [address] [place], [place] [address] PCP: [name], MD  Chief Complaint  Patient presents with  . Right Hip - Pain  . Lower Back - Pain      HPI: Patient is a 67 year old woman who presents with a chronic history of lower back pain.  She states she has tried a prednisone taper without relief presents with back pain radiating into the right groin region.  Assessment & Plan: Visit Diagnoses:  1. Chronic bilateral low back pain with right-sided sciatica   2. Pain in right hip     Plan: Will start on prednisone 20 mg every morning with breakfast.  Patient does have a caregiver with her.  Will reevaluate in 4 weeks to see how she progresses with the prednisone.  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  May require an MRI scan.  Follow-Up Instructions: No follow-ups on file.   Ortho Exam  Patient is alert, oriented, no adenopathy, well-dressed, normal affect, normal respiratory effort. Examination patient does have some mild dementia.  She has no pain with range of motion of the hip knee or ankle she has a negative straight leg raise no focal motor weakness.  Imaging: No results found. No images are attached to the encounter.  Labs: Lab Results  Component Value Date   LABORGA NO GROWTH 03/14/2015     Lab Results  Component Value Date   ALBUMIN 3.9 11/05/2017   ALBUMIN 4.2 08/24/2013   ALBUMIN 3.9 08/22/2012    Body mass index is 17.43 kg/m.  Orders:  Orders Placed This Encounter  Procedures  . XR Lumbar Spine 2-3 Views  . XR HIP UNILAT W OR W/O PELVIS 2-3 VIEWS RIGHT   Meds ordered this encounter  Medications  . predniSONE (DELTASONE) 10 MG tablet    Sig: Take 2 tablets (20 mg total) by mouth daily with breakfast.    Dispense:  60 tablet    Refill:  0     Procedures: No procedures performed  Clinical Data: No additional findings.  ROS:  All other systems negative, except as noted in the HPI. Review of Systems  Objective: Vital Signs: Ht 5\' 6"  (1.676 m)   Wt 108 lb (49 kg)   BMI 17.43 kg/m   Specialty Comments:  No specialty comments available.  PMFS History: Patient Active Problem List   Diagnosis Date Noted  . Diarrhea 10/14/2013  . Cigarette smoker 08/08/2011  . VAIN I (vaginal intraepithelial neoplasia grade I)   . COLONIC POLYPS 12/21/2007  . ALLERGIC RHINITIS 12/21/2007  . 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 HYPERCHOLESTEROLEMIA 07/05/2007  . 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 DERMATITIS 07/05/2007  . SHINGLES, HX OF 07/05/2007   Past Medical History:  Diagnosis Date  . ADD (attention deficit disorder)   . Allergy   . Anxiety   . Colon polyps    HYPERPLASTIC POLYP  . Depression   . Elevated cholesterol   . Shingles   . STD (sexually transmitted disease)    Condylomata  . 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  Breast cancer Mother        Age 74's  . Diabetes Maternal Grandmother   . Aneurysm Maternal Grandmother   . Aneurysm Maternal Aunt   . Aneurysm Maternal Grandfather   . Ankylosing spondylitis Father   . Colon cancer Neg Hx   . Colon polyps Neg Hx   . Esophageal cancer Neg Hx   . Rectal cancer Neg Hx   . Stomach cancer Neg Hx   . Dementia Neg Hx     Past Surgical History:  Procedure Laterality Date  . ABDOMINAL HYSTERECTOMY  1999   TAH  . ABDOMINAL SURGERY  1999   Abdominoplasty  . ACHILLES TENDON SURGERY    . AUGMENTATION MAMMAPLASTY     Implants  . COLONOSCOPY  2006  . POLYPECTOMY    . TONSILLECTOMY AND ADENOIDECTOMY    . TUBAL LIGATION     Social History   Occupational History  . Occupation: retired  Tobacco Use  . Smoking status: Current Some Day Smoker     Packs/day: 0.90    Types: Cigarettes  . Smokeless tobacco: Never Used  Substance and Sexual Activity  . Alcohol use: Yes    Alcohol/week: 0.0 standard drinks    Comment: very rare   . Drug use: Not Currently    Types: Marijuana  . Sexual activity: Never    Birth control/protection: Post-menopausal, Surgical    Comment: 1st intercourse 67 yo-More than 5 partners

## 2018-01-29 ENCOUNTER — Telehealth (INDEPENDENT_AMBULATORY_CARE_PROVIDER_SITE_OTHER): Payer: Self-pay

## 2018-01-29 NOTE — Telephone Encounter (Addendum)
-----   Message from Newt Minion, MD sent at 01/29/2018  1:13 PM EST -----  Patient was called and sent message via mychart.    Regarding: RE: Please Advise, No dictation Please call patient's family.  She does have a degenerative scoliosis with back pain and radicular symptoms.  Have recommended that she take prednisone 20 mg a day with breakfast and this may be decreased to 10 mg a day with breakfast if her symptoms resolve.  We are to follow-up with her in 2 weeks to see how she progresses.  If she is not progressing well with the prednisone we would need to set her up for an MRI scan of her lumbar spine and then have Dr. Ernestina Patches evaluate her for possible epidural steroid injection. ----- Message ----- From: Lorayne Bender, CMA Sent: 01/29/2018   8:38 AM EST To: Newt Minion, MD Subject: Please Advise, No dictation                    Dr. Sharol Given   Please advise if you have time today so I can send her an update. Thank you    This is Jaclynne Baldo sister, [place], in [place]. I am on her HIIPA and have Power of [employer]. She called after the visit today with you, but with her dementia - the summary of the visit was unclear. I believe you're prescribing prednasone and a return in 2 weeks. However, I am not clear on what you think the issue is that is causing the pain for her. She said you x-rayed and that her back is 'messed up'. I suspect you may have described it differently. Could you provide a summary of the outcome via this portal?  She also said she thought her scoliosis as a child may have contributed, however I have confirmed with our mother that she did not have scoliosis.  Many thanks,  [employer]

## 2018-02-03 ENCOUNTER — Encounter (INDEPENDENT_AMBULATORY_CARE_PROVIDER_SITE_OTHER): Payer: Self-pay | Admitting: Orthopedic Surgery

## 2018-02-10 ENCOUNTER — Ambulatory Visit (INDEPENDENT_AMBULATORY_CARE_PROVIDER_SITE_OTHER): Payer: Medicare Other | Admitting: Orthopedic Surgery

## 2018-02-10 ENCOUNTER — Encounter (INDEPENDENT_AMBULATORY_CARE_PROVIDER_SITE_OTHER): Payer: Self-pay | Admitting: Orthopedic Surgery

## 2018-02-10 VITALS — Ht 66.0 in | Wt 108.0 lb

## 2018-02-10 DIAGNOSIS — M5441 Lumbago with sciatica, right side: Secondary | ICD-10-CM

## 2018-02-10 DIAGNOSIS — G8929 Other chronic pain: Secondary | ICD-10-CM

## 2018-02-10 DIAGNOSIS — M4150 Other secondary scoliosis, site unspecified: Secondary | ICD-10-CM

## 2018-02-10 MED ORDER — PREDNISONE 10 MG PO TABS: 20.0000 mg | ORAL_TABLET | Freq: Every day | ORAL | 3 refills | Status: DC

## 2018-02-10 NOTE — Progress Notes (Signed)
Office Visit Note   Patient: Desiree Salinas           Date of Birth: 10/19/1950           MRN: [id number] Visit Date: 02/10/2018              Requested by: [name], MD [address] [place], [place] [address] PCP: [name], MD  Chief Complaint  Patient presents with  . 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  Assessment & Plan: Visit Diagnoses:  1. Chronic bilateral low back pain with right-sided sciatica   2. Degenerative scoliosis     Plan: Continue with the 10 mg of prednisone and then wean to taking 10 mg every other day as the symptoms improved.  A refill prescription was sent in for her prednisone should she need it.  Follow-Up Instructions: Return if symptoms worsen or fail to improve.   Ortho Exam  Patient is alert, oriented, no adenopathy, well-dressed, normal affect, normal respiratory effort. Examination patient has a normal gait.  She has a negative straight leg raise she does have some radicular pain to the right buttocks region there is no focal motor weakness.  We reviewed her radiographic findings with the grade 1 spondylolisthesis  at L4-5 and the degenerative scoliosis with calcification of the aorta.  Imaging: No results found. No images are attached to the encounter.  Labs: Lab Results  Component Value Date   LABORGA NO GROWTH 03/14/2015     Lab Results  Component Value Date   ALBUMIN 3.9 11/05/2017   ALBUMIN 4.2 08/24/2013   ALBUMIN 3.9 08/22/2012    Body mass index is 17.43 kg/m.  Orders:  No orders of the defined types were placed in this encounter.  Meds ordered this encounter  Medications  . predniSONE (DELTASONE) 10 MG tablet    Sig: Take 2 tablets (20 mg total) by mouth daily with breakfast.    Dispense:  60 tablet    Refill:  3       Procedures: No procedures performed  Clinical Data: No additional findings.  ROS:  All other systems negative, except as noted in the HPI. Review of Systems  Objective: Vital Signs: Ht 5\' 6"  (1.676 m)   Wt 108 lb (49 kg)   BMI 17.43 kg/m   Specialty Comments:  No specialty comments available.  PMFS History: Patient Active Problem List   Diagnosis Date Noted  . Degenerative scoliosis 02/10/2018  . Diarrhea 10/14/2013  . Cigarette smoker 08/08/2011  . VAIN I (vaginal intraepithelial neoplasia grade I)   . COLONIC POLYPS 12/21/2007  . ALLERGIC RHINITIS 12/21/2007  . 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 HYPERCHOLESTEROLEMIA 07/05/2007  . 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 DERMATITIS 07/05/2007  . SHINGLES, HX OF 07/05/2007   Past Medical History:  Diagnosis Date  . ADD (attention deficit disorder)   . Allergy   . Anxiety   . Colon polyps    HYPERPLASTIC POLYP  . Depression   . Elevated cholesterol   . Shingles   . STD (sexually transmitted disease)    Condylomata  . 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 Breast cancer Mother  Age 52's  . Diabetes Maternal Grandmother   . Aneurysm Maternal Grandmother   . Aneurysm Maternal Aunt   . Aneurysm Maternal Grandfather   . Ankylosing spondylitis Father   . Colon cancer Neg Hx   . Colon polyps Neg Hx   . Esophageal cancer Neg Hx   . Rectal cancer Neg Hx   . Stomach cancer Neg Hx   . Dementia Neg Hx     Past Surgical History:  Procedure Laterality Date  . ABDOMINAL HYSTERECTOMY  1999   TAH  . ABDOMINAL SURGERY  1999   Abdominoplasty  . ACHILLES TENDON SURGERY    . AUGMENTATION MAMMAPLASTY     Implants  . COLONOSCOPY  2006  . POLYPECTOMY    . TONSILLECTOMY AND ADENOIDECTOMY    . TUBAL LIGATION     Social History   Occupational History  . Occupation: retired  Tobacco Use  . Smoking  status: Current Some Day Smoker    Packs/day: 0.90    Types: Cigarettes  . Smokeless tobacco: Never Used  Substance and Sexual Activity  . Alcohol use: Yes    Alcohol/week: 0.0 standard drinks    Comment: very rare   . Drug use: Not Currently    Types: Marijuana  . Sexual activity: Never    Birth control/protection: Post-menopausal, Surgical    Comment: 1st intercourse 67 yo-More than 5 partners

## 2018-03-05 DIAGNOSIS — S20219A Contusion of unspecified front wall of thorax, initial encounter: Secondary | ICD-10-CM

## 2018-03-05 HISTORY — DX: Contusion of unspecified front wall of thorax, initial encounter: S20.219A

## 2018-04-17 ENCOUNTER — Ambulatory Visit (INDEPENDENT_AMBULATORY_CARE_PROVIDER_SITE_OTHER): Payer: Medicare Other | Admitting: Neurology

## 2018-04-17 ENCOUNTER — Encounter

## 2018-04-17 ENCOUNTER — Encounter: Payer: Self-pay | Admitting: Neurology

## 2018-04-17 VITALS — BP 103/62 | HR 96 | Ht 66.0 in | Wt 118.0 lb

## 2018-04-17 DIAGNOSIS — F1027 Alcohol dependence with alcohol-induced persisting dementia: Secondary | ICD-10-CM

## 2018-04-17 MED ORDER — DONEPEZIL HCL 5 MG PO TABS: 5.0000 mg | ORAL_TABLET | Freq: Every day | ORAL | 6 refills | Status: DC

## 2018-04-17 NOTE — Patient Instructions (Signed)
Donepezil tablets What is this medicine? DONEPEZIL (doe NEP e zil) is used to treat mild to moderate dementia caused by Alzheimer's disease. This medicine may be used for other purposes; ask your health care provider or pharmacist if you have questions. COMMON BRAND NAME(S): Aricept What should I tell my health care provider before I take this medicine? They need to know if you have any of these conditions: -asthma or other lung disease -difficulty passing urine -head injury -heart disease -history of irregular heartbeat -liver disease -seizures (convulsions) -stomach or intestinal disease, ulcers or stomach bleeding -an unusual or allergic reaction to donepezil, other medicines, foods, dyes, or preservatives -pregnant or trying to get pregnant -breast-feeding How should I use this medicine? Take this medicine by mouth with a glass of water. Follow the directions on the prescription label. You may take this medicine with or without food. Take this medicine at regular intervals. This medicine is usually taken before bedtime. Do not take it more often than directed. Continue to take your medicine even if you feel better. Do not stop taking except on your doctor's advice. If you are taking the 23 mg donepezil tablet, swallow it whole; do not cut, crush, or chew it. Talk to your pediatrician regarding the use of this medicine in children. Special care may be needed. Overdosage: If you think you have taken too much of this medicine contact a poison control center or emergency room at once. NOTE: This medicine is only for you. Do not share this medicine with others. What if I miss a dose? If you miss a dose, take it as soon as you can. If it is almost time for your next dose, take only that dose, do not take double or extra doses. What may interact with this medicine? Do not take this medicine with any of the following medications: -certain medicines for fungal infections like itraconazole,  fluconazole, posaconazole, and voriconazole -cisapride -dextromethorphan; quinidine -dofetilide -dronedarone -pimozide -quinidine -thioridazine -ziprasidone This medicine may also interact with the following medications: -antihistamines for allergy, cough and cold -atropine -bethanechol -carbamazepine -certain medicines for bladder problems like oxybutynin, tolterodine -certain medicines for Parkinson's disease like benztropine, trihexyphenidyl -certain medicines for stomach problems like dicyclomine, hyoscyamine -certain medicines for travel sickness like scopolamine -dexamethasone -ipratropium -NSAIDs, medicines for pain and inflammation, like ibuprofen or naproxen -other medicines for Alzheimer's disease -other medicines that prolong the QT interval (cause an abnormal heart rhythm) -phenobarbital -phenytoin -rifampin, rifabutin or rifapentine This list may not describe all possible interactions. Give your health care provider a list of all the medicines, herbs, non-prescription drugs, or dietary supplements you use. Also tell them if you smoke, drink alcohol, or use illegal drugs. Some items may interact with your medicine. What should I watch for while using this medicine? Visit your doctor or health care professional for regular checks on your progress. Check with your doctor or health care professional if your symptoms do not get better or if they get worse. You may get drowsy or dizzy. Do not drive, use machinery, or do anything that needs mental alertness until you know how this drug affects you. What side effects may I notice from receiving this medicine? Side effects that you should report to your doctor or health care professional as soon as possible: -allergic reactions like skin rash, itching or hives, swelling of the face, lips, or tongue -feeling faint or lightheaded, falls -loss of bladder control -seizures -signs and symptoms of a dangerous change in heartbeat or  heart   rhythm like chest pain; dizziness; fast or irregular heartbeat; palpitations; feeling faint or lightheaded, falls; breathing problems -signs and symptoms of infection like fever or chills; cough; sore throat; pain or trouble passing urine -signs and symptoms of liver injury like dark yellow or brown urine; general ill feeling or flu-like symptoms; light-colored stools; loss of appetite; nausea; right upper belly pain; unusually weak or tired; yellowing of the eyes or skin -slow heartbeat or palpitations -unusual bleeding or bruising -vomiting Side effects that usually do not require medical attention (report to your doctor or health care professional if they continue or are bothersome): -diarrhea, especially when starting treatment -headache -loss of appetite -muscle cramps -nausea -stomach upset This list may not describe all possible side effects. Call your doctor for medical advice about side effects. You may report side effects to FDA at 1-800-FDA-1088. Where should I keep my medicine? Keep out of reach of children. Store at room temperature between 15 and 30 degrees C (59 and 86 degrees F). Throw away any unused medicine after the expiration date. NOTE: This sheet is a summary. It may not cover all possible information. If you have questions about this medicine, talk to your doctor, pharmacist, or health care provider.  2019 [employer] (2015-08-08 21:00:42)

## 2018-04-17 NOTE — Progress Notes (Signed)
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 Here with her assistant as well as her sister [name] and her daughter [name]. Kimba has seen decline from last summer. More confusion in her own home. Worsening later in the day. She is sleeping on the couch more. She may not be taking her meds, a few times in the last week she has been up late and sitting up watching TV late. She needs prompting to take her meds. She gets up about 830am, her caregiver says she is still asleep about 830am and the caretaker lets her sleep. The caretaker stays for 4 hours and leaves early afternoon, they run errands. The rest of the afternoon she is alone, she watches a lot of TV, there is some agitation which is a little worsening and she was watching a show and asked her caregiver if she was mean to her. There is frustration.   Addendum: Formal neurocognitive testing was diagnostic of dementia. MRI of the brain and CT head showed extensive calcifications in the basal ganglia and other structures. There can be multiple reasons including  alcoholism(can cause hypocalcemia which can cause this), parathyroid problems, previous infections, lead poisoning and Fahr's syndrome (a genetic disorder), fabry's . CT of the head confirmed the calcifications. The serum tests were mildly abnormal(elevated phos, decreased calcium, normal PTH) but unclear if they represent hypoparathyroidism or not which can cause these brain findings but I would rather have her evaluated by endocrinology if she is willing to see them. thanks  Interval history 05/27/2017: Patient returns for follow up of memory changes. MRI of the brain was ordered at last appointment but not completed. Labs  including HIV, RPR, B12 and folate unremarkable. At last appointment we discussed possibility of memory changes due to normal cognitive aging, stress anxiety and depression. She said at last appointment she felt better.  However they have reported symptoms in the past such as having difficulty with calendars, missing medications, family has to help with finances, being less social, delusions, more difficulty doing things like playing bridge.  I have ordered CT of the head and MRI of the brain in the past and patient has not completed either.  She is on Wellbutrin and BuSpar for her mood disorders. She sees a therapist regularly. They have been discussing patient moving from being alone in her house to a senior community. Sister is here with her again today, the family is worried about her alone. The family calls daily and has a schedule to check in with sister and make sure patient is eating and ok. She is more anxious or possibly confused. She struglles to use her phone. She carries her cell phone in a necklace due to she was losing it a lot. There is significant depression and anxiety.    Interval history 09/25/2016: Patient returns for follow up of memory changes. MRI of the brain was ordered at last appointment but not completed. Labs including HIV, RPR, B12 and folate unremarkable. At last appointment likely thought to be due to normal cognitive aging, stress anxiety and depression. Things are better. She is here with her sister. Sister says there are other things that she is concerned about, she got mad at her mom all day because she thought she was getting kicked out of the  bed, she misread her mother's intentions. She has some delusions, she thought someone was accusing her of stealing when they asked if she had seen something(a bracelet). But patient said she just misread the situations and understands no one was accusing her. Her friends have called the sister being very concerned because patient is  having difficulty playing bridge and she has played for many years, she is mixing up the dates for bridge and not going, sister is trying to help patient with all the paper in her house (hoarding?). MMSE today is worse 26/30 (from 29/30 8 months ago). Sister was so concerned she travelled for this appointment.  Having difficulty with calendars.. She takes all her medications and manages her own medications but misses sometimes. Family is helping with finances, family had to get involved with finances to take care of some old bills and she is having difficulty. She is having difficulty with her calendar. She does not drive at night, she denies getting lost or confused during the day when driving.She is less social. No accidents in the home. She does not cook often [place]. She hesitates sometimes [employer]. Sister believes her memory issues related to anxiety, depression, isolation.   HPI:  DESSA LEDEE is a 68 y.o. female here as a referral from Dr. Ardeth Perfect for memory changes. Past medical history of depression, anxiety, high cholesterol, chronic kidney disease, insomnia, tobacco use, vitamin D deficiency, attention deficit disorder on adderall.  Memory problems going on for "a while" maybe a year, she loses her train of thought, her younger sister visited her because she was worried patient was hoarding which she was not, she is losing track of her thoughts, she had one episode where she had too much medicine possibly but unsure why she felt her speech was slurred. She lives independently, alone, manage all her own affairs, no bills missing, she is driving fine but at night when driving she feels disoriented. Lists and sticky notes help. She is also ADD and has depression and anxiety. No family history of dementia. Sometimes forgets what she is doing, where she is when driving. No personality changes, no delusions or hallucinations, no inciting events or head trauma, no  other associated symptoms or modifying factors. Symptoms wax and wane not progressive. Not interfering with her ADLs or IADLs.   Reviewed notes, labs and imaging from outside physicians, which showed:  Reviewed notes from primary care. She is a current every day smoker. She drinks wine occasionally. No risk for alcohol abuse reviewed her screening. States she feels like her memory is failing and she is also very disorganized. Her sister recently came to help her clean up her home. She states she has an Fhx of hoarding and her family and is afraid of being in that way. States high anxiety and is on many medications. Says her medications for psychiatric disorders were "tweaked" at last visit. She forgets things like what she is going to stay. She is very anxious and fearful to go out of the home. Being treated for anxiety, ADD and depression. She follows with psychiatry. Symptoms ongoing for a year.  BUN 16, creatinine 1.1 10/27/2015, TSH 1.7.   Review of Systems: Patient complains of symptoms per HPI as well as the following symptoms: No CP, no SOB, no fevers, no chills. Pertinent negatives per HPI. All others negative.  Social History   Socioeconomic History  . Marital status: Divorced    Spouse name: Not on  file  . Number of children: 2  . Years of education: College  . Highest education level: Not on file  Occupational History  . Occupation: retired  Scientific laboratory technician  . Financial resource strain: Not on file  . Food insecurity:    Worry: Not on file    Inability: Not on file  . Transportation needs:    Medical: Not on file    Non-medical: Not on file  Tobacco Use  . Smoking status: Current Some Day Smoker    Packs/day: 0.90    Types: Cigarettes  . Smokeless tobacco: Never Used  . Tobacco comment: 10 cigarettes per day as of 04/17/2018  Substance and Sexual Activity  . Alcohol use: Not Currently    Alcohol/week: 0.0 standard drinks    Comment: none since May 2019  . Drug use:  Not Currently    Types: Marijuana  . Sexual activity: Never    Birth control/protection: Post-menopausal, Surgical    Comment: 1st intercourse 68 yo-More than 5 partners  Lifestyle  . Physical activity:    Days per week: Not on file    Minutes per session: Not on file  . Stress: Not on file  Relationships  . Social connections:    Talks on phone: Not on file    Gets together: Not on file    Attends religious service: Not on file    Active member of club or organization: Not on file    Attends meetings of clubs or organizations: Not on file    Relationship status: Not on file  . Intimate partner violence:    Fear of current or ex partner: Not on file    Emotionally abused: Not on file    Physically abused: Not on file    Forced sexual activity: Not on file  Other Topics Concern  . Not on file  Social History Narrative   Lives: alone, has caregiver there 8:30-afternoon and 24 hour on call. Daughter also lives there      Family History  Problem Relation Age of Onset  . Breast cancer Mother        Age 37's  . Diabetes Maternal Grandmother   . Aneurysm Maternal Grandmother   . Aneurysm Maternal Aunt   . Aneurysm Maternal Grandfather   . Ankylosing spondylitis Father   . Colon cancer Neg Hx   . Colon polyps Neg Hx   . Esophageal cancer Neg Hx   . Rectal cancer Neg Hx   . Stomach cancer Neg Hx   . Dementia Neg Hx     Past Medical History:  Diagnosis Date  . ADD (attention deficit disorder)   . Allergy   . Anxiety   . Colon polyps    HYPERPLASTIC POLYP  . Dementia ([place])   . Depression   . Elevated cholesterol   . Shingles   . STD (sexually transmitted disease)    Condylomata  . 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 ABDOMINAL HYSTERECTOMY  1999   TAH  . ABDOMINAL SURGERY  1999   Abdominoplasty  . ACHILLES TENDON SURGERY    . AUGMENTATION MAMMAPLASTY      Implants  . COLONOSCOPY  2006  . POLYPECTOMY    . TONSILLECTOMY AND ADENOIDECTOMY    . TUBAL LIGATION      Current Outpatient Medications  Medication Sig Dispense Refill  . acetaminophen (TYLENOL) 500 MG  tablet Take 1,000 mg by mouth at bedtime.    Marland Kitchen amphetamine-dextroamphetamine (ADDERALL XR) 15 MG 24 hr capsule Take 1 tablet by mouth daily.    Marland Kitchen atorvastatin (LIPITOR) 40 MG tablet TAKE 1 BY MOUTH DAILY 30 tablet 3  . busPIRone (BUSPAR) 10 MG tablet Take 10 mg by mouth. Take 1 tablet by mouth every morning and 2 tablets at bedtime    . escitalopram (LEXAPRO) 10 MG tablet Take 10 mg by mouth daily.    Marland Kitchen MELATONIN PO Take 1 mg by mouth as needed.     . pindolol (VISKEN) 10 MG tablet Take 10 mg by mouth daily.    . predniSONE (DELTASONE) 10 MG tablet Take 2 tablets (20 mg total) by mouth daily with breakfast. (Patient taking differently: Take 10-20 mg by mouth daily with breakfast. ) 60 tablet 3  . donepezil (ARICEPT) 5 MG tablet Take 1 tablet (5 mg total) by mouth at bedtime. 30 tablet 6  . UNABLE TO FIND Med Name: [facility] Hemp 300 mg     Current Facility-Administered Medications  Medication Dose Route Frequency Provider Last Rate Last Dose  . 0.9 %  sodium chloride infusion  500 mL Intravenous Continuous Nelida Meuse III, MD      . TDaP Durwin Reges) injection 0.5 mL  0.5 mL Intramuscular Once Noralee Space, MD        Allergies as of 04/17/2018 - Review Complete 04/17/2018  Allergen Reaction Noted  . Eucerin [basis facial moisturizer]  08/26/2012  . Latex    . Nickel Hives 12/11/2010  . Cobalt Rash 10/13/2015    Vitals: BP 103/62 (BP Location: Right Arm, Patient Position: Sitting)   Pulse 96   Ht 5\' 6"  (1.676 m)   Wt 118 lb (53.5 kg)   BMI 19.05 kg/m  Last Weight:  Wt Readings from Last 1 Encounters:  04/17/18 118 lb (53.5 kg)   Last Height:   Ht Readings from Last 1 Encounters:  04/17/18 5\' 6"  (1.676 m)   MMSE - Mini Mental State Exam 05/27/2017 09/25/2016 12/30/2015    Orientation to time 5 1 5   Orientation to Place 4 5 5   Registration 3 3 3   Attention/ Calculation 4 5 5   Recall 3 3 2   Language- name 2 objects 2 2 2   Language- repeat 1 1 1   Language- follow 3 step command 3 3 3   Language- read & follow direction 1 1 1   Write a sentence 0 1 1  Copy design 0 1 1  Total score 26 26 29    Physical exam: Exam: Gen: NAD, conversant, well nourised, well groomed                     CV: RRR, no MRG. No Carotid Bruits. No peripheral edema, warm, nontender Eyes: Conjunctivae clear without exudates or hemorrhage  Neuro: Detailed Neurologic Exam  Speech:    Speech is normal; fluent and spontaneous with normal comprehension.  Cognition:    The patient is oriented to person, place, and time;     recent and remote memory intact;     language fluent;     normal attention, concentration,     fund of knowledge Cranial Nerves:    The pupils are equal, round, and reactive to light. The fundi are normal and spontaneous venous pulsations are present. Visual fields are full to finger confrontation. Extraocular movements are intact. Trigeminal sensation is intact and the muscles of mastication are normal. The face is  symmetric. The palate elevates in the midline. Hearing intact. Voice is normal. Shoulder shrug is normal. The tongue has normal motion without fasciculations.   Coordination:    Normal finger to nose and heel to shin. Normal rapid alternating movements.   Gait:    Heel-toe and tandem gait are normal.   Motor Observation:    No asymmetry, no atrophy, and no involuntary movements noted. Tone:    Normal muscle tone.    Posture:    Posture is normal. normal erect    Strength:    Strength is V/V in the upper and lower limbs.      Sensation: intact to LT     Reflex Exam:  DTR's:    Deep tendon reflexes in the upper and lower extremities are normal bilaterally.   Toes:    The toes are downgoing bilaterally.   Clonus:    Clonus is  absent.    Assessment/Plan:  68 year old with here as a referral from Dr. Ardeth Perfect for memory changes, have been following her since 2017 with reported progressive symptoms. Past medical history of depression, anxiety, high cholesterol, chronic kidney disease, insomnia, tobacco use, vitamin D deficiency, attention deficit disorder on adderall.  No Fhx of dementia. Formal neurocognitive testing was diagnostic of dementia, vascular/alcoholic.  - Start Aricept, not FDA approved for this type of dementia but some studies show it may help. - No need for any behavior modifying meds at this time, discussed management and redirection - She is waiting for placement at [facility] in the meantime recommend monitoring at all times suggested a video camera for safety and frequent checks on patient when no one can be with her at home   - B12, folate, rpr, hiv: all WNL - Continued f/u with therapy and psychiatry for mood disorders - Advised continued cigarette smoking is a risk factor for dementia, encouraged cessation  Cc: HOLWERDA, SCOTT, MD  A total of 50 minutes was spent face-to-face with this patient. Over half this time was spent on counseling patient on the  1. 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      Sarina Ill, MD  [facility] [address] [place], [place] [address]  Phone [phone] Fax [phone]  A total of 25 minutes was spent face-to-face with this patient. Over half this time was spent on counseling patient on the memory changesdiagnosis and different diagnostic and therapeutic options available.

## 2018-04-19 ENCOUNTER — Encounter: Payer: Self-pay | Admitting: Neurology

## 2018-04-19 DIAGNOSIS — F1027 Alcohol dependence with alcohol-induced persisting dementia: Secondary | ICD-10-CM | POA: Insufficient documentation

## 2018-04-30 ENCOUNTER — Telehealth: Payer: Self-pay | Admitting: Neurology

## 2018-04-30 NOTE — Telephone Encounter (Signed)
See mychart.  

## 2018-04-30 NOTE — Telephone Encounter (Signed)
Pt's sister Jacqui/DPR advised the pt took donepezil (ARICEPT) 5 MG tablet last night after eating and has eaten this am. The caregiver just called and advised the patient's eyes rolled to the back and she collapsed. The patient is ok now. Should the patient be seen or just stop the medication. Please call to advise

## 2018-06-04 ENCOUNTER — Telehealth (INDEPENDENT_AMBULATORY_CARE_PROVIDER_SITE_OTHER): Payer: Self-pay

## 2018-06-04 NOTE — Telephone Encounter (Signed)
I called pt and POA stated no to all COVID-19 questions.

## 2018-06-05 ENCOUNTER — Ambulatory Visit (INDEPENDENT_AMBULATORY_CARE_PROVIDER_SITE_OTHER): Payer: Medicare Other

## 2018-06-05 ENCOUNTER — Other Ambulatory Visit: Payer: Self-pay

## 2018-06-05 ENCOUNTER — Telehealth: Payer: Self-pay | Admitting: Neurology

## 2018-06-05 ENCOUNTER — Encounter (INDEPENDENT_AMBULATORY_CARE_PROVIDER_SITE_OTHER): Payer: Self-pay | Admitting: Orthopedic Surgery

## 2018-06-05 ENCOUNTER — Ambulatory Visit (INDEPENDENT_AMBULATORY_CARE_PROVIDER_SITE_OTHER): Payer: Medicare Other | Admitting: Orthopedic Surgery

## 2018-06-05 VITALS — Ht 66.0 in | Wt 118.0 lb

## 2018-06-05 DIAGNOSIS — R0782 Intercostal pain: Secondary | ICD-10-CM

## 2018-06-05 NOTE — Telephone Encounter (Signed)
Jacquline Aiken(care taker to pt not on DPR) called and was with pt @ Dr Jess Barters office re: a fall pt had on Tues.  Claude Manges states that Dr Sharol Given feels pt should be seen by Dr Jaynee Eagles because he believes the fall is neurologic.  Jacquline Aiken(care taker to pt not on DPR)is aware that based on her not being on DPR it is not likely she will get a call back.  She understands and is asking pt sister(which is on DPR) be called by RN to discuss the recent fall and an idea of pt being seen.  It was explained that pt would be given option of a virtual or a telephone visit once pt has given consent for her insurance to be filed.  Please call pt sister Desiree Salinas)

## 2018-06-09 ENCOUNTER — Encounter (INDEPENDENT_AMBULATORY_CARE_PROVIDER_SITE_OTHER): Payer: Self-pay | Admitting: Orthopedic Surgery

## 2018-06-09 ENCOUNTER — Other Ambulatory Visit: Payer: Self-pay

## 2018-06-09 ENCOUNTER — Ambulatory Visit
Admission: RE | Admit: 2018-06-09 | Discharge: 2018-06-09 | Disposition: A | Discharge status: Home / Self Care | Payer: Medicare Other | Source: Ambulatory Visit | Attending: Neurology | Admitting: Neurology

## 2018-06-09 ENCOUNTER — Other Ambulatory Visit: Payer: Self-pay | Admitting: Neurology

## 2018-06-09 ENCOUNTER — Telehealth: Payer: Self-pay | Admitting: Neurology

## 2018-06-09 ENCOUNTER — Encounter: Payer: Self-pay | Admitting: *Deleted

## 2018-06-09 DIAGNOSIS — R410 Disorientation, unspecified: Secondary | ICD-10-CM | POA: Diagnosis not present

## 2018-06-09 DIAGNOSIS — W19XXXA Unspecified fall, initial encounter: Secondary | ICD-10-CM | POA: Insufficient documentation

## 2018-06-09 DIAGNOSIS — R51 Headache: Secondary | ICD-10-CM | POA: Diagnosis not present

## 2018-06-09 DIAGNOSIS — S0990XA Unspecified injury of head, initial encounter: Secondary | ICD-10-CM

## 2018-06-09 DIAGNOSIS — E7521 Fabry (-Anderson) disease: Secondary | ICD-10-CM

## 2018-06-09 DIAGNOSIS — G44319 Acute post-traumatic headache, not intractable: Secondary | ICD-10-CM

## 2018-06-09 NOTE — Progress Notes (Signed)
Office Visit Note   Patient: Desiree Salinas           Date of Birth: 08-Feb-1951           MRN: [id number] Visit Date: 06/05/2018              Requested by: [name], MD [address] [place], [place] [address] PCP: [name], MD  Chief Complaint  Patient presents with  . Chest - Pain      HPI: Patient is a 68 year old woman who was found by her caregiver in her bedroom with reports that the room was torn apart.  Patient states she was playing karate with somebody.  Patient complains of bilateral rib pain pain with movement denies any shortness of breath.  Assessment & Plan: Visit Diagnoses:  1. Intercostal pain     Plan: Patient does have clonus in her lower extremities and a tremor of the upper extremities and a ataxic gait.  We have made recommendations to follow-up with her neurologist.  Conservative treatment for the rib pain.  Follow-Up Instructions: Return if symptoms worsen or fail to improve.   Ortho Exam  Patient is alert, oriented, no adenopathy, well-dressed, normal affect, normal respiratory effort. Examination patient does have a wide-based and ataxic gait.  Patient has an intention tremor with upper extremities.  No focal motor weakness in her upper extremities.  Semination the lower extremities patient has clonus with dorsiflexion of the ankle.  Patient is tender to palpation over her ribs.  No other areas on the upper extremity or lower extremity that are painful to palpation.  Imaging: Ct Head Wo Contrast  Result Date: 06/09/2018  [facility] [address] [place], [place] [address] [phone] NEUROIMAGING REPORT STUDY DATE: 06/09/2018 PATIENT NAME: Desiree Salinas DOB: 1950/10/24 MRN: [id number] EXAM: CT of the head without contrast ORDERING CLINICIAN: Delfin Edis, MD CLINICAL HISTORY: 68 year old woman with headaches COMPARISON FILMS: None TECHNIQUE: CT scan of the head was obtained utilizing 5 mm axial slices from the  skull base to the vertex. CONTRAST: None IMAGING SITE: [facility],  [address] FINDINGS: The fourth, third and lateral ventricles are normal in size for age.  No extra-axial fluid collections are seen.  No intracranial hemorrhage.  No evidence of mass effect or midline shift.  No acute findings.  There is heavy calcification within the pulvinar of both thalami and lighter calcification in the basal ganglia and slight calcification in the cerebellar nuclei and some occipital gyri.  There appears to be some mild chronic microvascular ischemic changes in the hemispheres.  The orbits and their contents, paranasal sinuses and calvarium are unremarkable.    This CT scan of the head without contrast shows the following: 1.   There is heavy calcification within the pulvinar nuclei of the thalamus bilaterally with milder calcification in the basal ganglia and slight calcification in the deep nuclei of the cerebellum and the medial occipital gyri.  Although nonspecific, possible etiologies include metabolic calcium disorders, Fabry's disease and sequela of previous infection. 2.   There are no acute findings. INTERPRETING PHYSICIAN: Richard A. Felecia Shelling, MD, PhD, FAAN Certified in  Neuroimaging by [employer]   No images are attached to the encounter.  Labs: Lab Results  Component Value Date   LABORGA NO GROWTH 03/14/2015     Lab Results  Component Value Date   ALBUMIN 3.9 11/05/2017   ALBUMIN 4.2 08/24/2013   ALBUMIN 3.9 08/22/2012  Body mass index is 19.05 kg/m.  Orders:  Orders Placed This Encounter  Procedures  . XR Chest 2 View   No orders of the defined types were placed in this encounter.    Procedures: No procedures performed  Clinical Data: No additional findings.  ROS:  All other systems negative, except as noted in the HPI. Review of Systems  Objective: Vital Signs: Ht 5\' 6"  (1.676 m)   Wt 118 lb (53.5 kg)   BMI 19.05 kg/m   Specialty  Comments:  No specialty comments available.  PMFS History: Patient Active Problem List   Diagnosis Date Noted  . Alcoholic dementia ([place]) 14/78/2956  . Degenerative scoliosis 02/10/2018  . Diarrhea 10/14/2013  . Cigarette smoker 08/08/2011  . VAIN I (vaginal intraepithelial neoplasia grade I)   . COLONIC POLYPS 12/21/2007  . ALLERGIC RHINITIS 12/21/2007  . 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 HYPERCHOLESTEROLEMIA 07/05/2007  . 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 DERMATITIS 07/05/2007  . SHINGLES, HX OF 07/05/2007   Past Medical History:  Diagnosis Date  . ADD (attention deficit disorder)   . Allergy   . Anxiety   . Colon polyps    HYPERPLASTIC POLYP  . Dementia ([place])   . Depression   . Elevated cholesterol   . Shingles   . STD (sexually transmitted disease)    Condylomata  . 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 Breast cancer Mother        Age 22's  . Diabetes Maternal Grandmother   . Aneurysm Maternal Grandmother   . Aneurysm Maternal Aunt   . Aneurysm Maternal Grandfather   . Ankylosing spondylitis Father   . Colon cancer Neg Hx   . Colon polyps Neg Hx   . Esophageal cancer Neg Hx   . Rectal cancer Neg Hx   . Stomach cancer Neg Hx   . Dementia Neg Hx     Past Surgical History:  Procedure Laterality Date  . ABDOMINAL HYSTERECTOMY  1999   TAH  . ABDOMINAL SURGERY  1999   Abdominoplasty  . ACHILLES TENDON SURGERY    . AUGMENTATION MAMMAPLASTY     Implants  . COLONOSCOPY  2006  . POLYPECTOMY    . TONSILLECTOMY AND ADENOIDECTOMY    . TUBAL LIGATION     Social History   Occupational History  . Occupation: retired  Tobacco Use  . Smoking status: Current Some Day Smoker    Packs/day: 0.90    Types: Cigarettes  . Smokeless tobacco: Never Used  . Tobacco comment: 10 cigarettes per day as of 04/17/2018  Substance and Sexual Activity  . Alcohol  use: Not Currently    Alcohol/week: 0.0 standard drinks    Comment: none since May 2019  . Drug use: Not Currently    Types: Marijuana  . Sexual activity: Never    Birth control/protection: Post-menopausal, Surgical    Comment: 1st intercourse 68 yo-More than 5 partners

## 2018-06-09 NOTE — Telephone Encounter (Signed)
Medicare/[place] no auth patient is scheduled at GI for 06/09/18.

## 2018-06-09 NOTE — Telephone Encounter (Signed)
Desiree Salinas, CT showed nothing acute but showed more significant calcifications(as seen in the past). I would like to test patient for Fabry's disease, it is a rare disorder but I want to make sure she doesn;t have it. It involves a blood test. I will order the blood test and she can come to the office when our lab is open.

## 2018-06-09 NOTE — Telephone Encounter (Signed)
See mychart.  

## 2018-06-10 NOTE — Telephone Encounter (Signed)
Note per Dr. Jaynee Eagles, pt will be coming into office tomorrow 4/8 @ 10:30 AM.

## 2018-06-11 ENCOUNTER — Encounter: Payer: Self-pay | Admitting: Neurology

## 2018-06-11 ENCOUNTER — Ambulatory Visit (INDEPENDENT_AMBULATORY_CARE_PROVIDER_SITE_OTHER): Payer: Medicare Other | Admitting: Neurology

## 2018-06-11 ENCOUNTER — Other Ambulatory Visit: Payer: Self-pay

## 2018-06-11 VITALS — BP 93/54 | HR 89 | Temp 98.7°F | Ht 66.0 in | Wt 125.0 lb

## 2018-06-11 DIAGNOSIS — F17210 Nicotine dependence, cigarettes, uncomplicated: Secondary | ICD-10-CM

## 2018-06-11 DIAGNOSIS — G9340 Encephalopathy, unspecified: Secondary | ICD-10-CM | POA: Diagnosis not present

## 2018-06-11 DIAGNOSIS — E7521 Fabry (-Anderson) disease: Secondary | ICD-10-CM

## 2018-06-11 DIAGNOSIS — Z8349 Family history of other endocrine, nutritional and metabolic diseases: Secondary | ICD-10-CM

## 2018-06-11 DIAGNOSIS — F0150 Vascular dementia without behavioral disturbance: Secondary | ICD-10-CM | POA: Diagnosis not present

## 2018-06-11 DIAGNOSIS — E538 Deficiency of other specified B group vitamins: Secondary | ICD-10-CM | POA: Diagnosis not present

## 2018-06-11 DIAGNOSIS — F1027 Alcohol dependence with alcohol-induced persisting dementia: Secondary | ICD-10-CM

## 2018-06-11 DIAGNOSIS — N3000 Acute cystitis without hematuria: Secondary | ICD-10-CM | POA: Diagnosis not present

## 2018-06-11 DIAGNOSIS — R079 Chest pain, unspecified: Secondary | ICD-10-CM

## 2018-06-11 NOTE — Progress Notes (Addendum)
[facility]    Provider:  Dr Jaynee Eagles Referring Provider: [name], MD Primary Care Physician:  [name], MD  CC:  Alcohol-related/vascular dementia  Interval history: Patient with dementia. Significant calcifications in the brain. She has been to endocrinology and no etiology found. She does not have a history of Faby disease or symptoms of this or dementia in first degree relatives. She has no neuropathic or limb pain, no skin findings. No corneal opacities, no known cardiac or pulmonary disease, no proteinuria She has no GI manifestations. 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 But given the extent of calcifications feel Genetic testing is warranted despite no other indications of the disease. Will check alpha-galactosidase however this is not diagnostic usually in women so will need to find genetic testing. I discussed all this with her sister and she agrees. Today patient is here with caregiver. She has also declined tremendously after a fall.   Here with caretaker. Last Tuesday a week ago she found her on the floor. She said she just fell 20 minutes ago which caretaker thinks is correct. The room was moved, it was a mess, everything was everywhere. She tried to see the medical doctor and she could not go to the ED. She saw ortho and nothing was broken but ribs bruised. She found patient on the floor the next night as well, she had diarrhea and it was everywhere. She usually does not have any GI problems but she ate cookies that night. There is no alcohol in the house. Patient reports her urine was dark. She was started on Cephalexin. She is more disoriented. She can;t find her way to the bathroom. She is getting more agitated, not like patient at all. Caretaker stays until 10pm and then comes back in the morning. Patient is not smoking. More confused. Caretaker cannot go into the bathroom and patient won;t her wipe her. Showers 3x a week. She drinks a lot  of fluids. 2 days antibiotics now.   Sister denies any of the following common Fabry's symptoms/signs in patient or family members:  Intermittent episodes of burning pain in the extremities (acroparesthesias)  ?Cutaneous vascular lesions (angiokeratomas) ?Diminished perspiration (hypo- or anhidrosis) ?Characteristic corneal and lenticular opacities ?Abdominal pain, nausea, and/or diarrhea of unknown etiology in young adulthood ?Left ventricular hypertrophy (LVH) or hypertrophic cardiomyopathy of unknown etiology, particularly in young adults ?Arrhythmias of unknown etiology, particularly in young adults ?Stroke of unknown etiology at any age ?Chronic kidney disease (CKD) and/or proteinuria of unknown etiology ?Multiple renal sinus cysts discovered incidentally A family history suggestive of the disorder (ie, history of unexplained gastrointestinal symptoms, extremity pain, and/or kidney disease, ischemic stroke, or cardiac disease in one or more family members) is particularly helpful and presents the opportunity for screening the entire pedigree   Interval history 04/17/2018: Patient Is here for 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 Here with her assistant as well as her sister Desiree Salinas and her daughter Desiree Salinas. Desiree Salinas has seen decline from last summer. More confusion in her own home. Worsening later in the day. She is sleeping on the couch more. She may not be taking her meds, a few times in the last week she has been up late and sitting up watching TV late. She needs prompting to take her meds. She gets up about 830am, her caregiver says she is still asleep about 830am and the caretaker lets her sleep. The caretaker stays for 4 hours and leaves early afternoon, they run errands. The  rest of the afternoon she is alone, she watches a lot of TV, there is some agitation which is a little worsening and she was watching a show and asked her caregiver if she  was mean to her. There is frustration.   Addendum: Formal neurocognitive testing was diagnostic of dementia. MRI of the brain and CT head showed extensive calcifications in the basal ganglia and other structures. There can be multiple reasons including  alcoholism(can cause hypocalcemia which can cause this), parathyroid problems, previous infections, lead poisoning and Fahr's syndrome (a genetic disorder), fabry's . CT of the head confirmed the calcifications. The serum tests were mildly abnormal(elevated phos, decreased calcium, normal PTH) but unclear if they represent hypoparathyroidism or not which can cause these brain findings but I would rather have her evaluated by endocrinology if she is willing to see them. thanks  Interval history 05/27/2017: Patient returns for follow up of memory changes. MRI of the brain was ordered at last appointment but not completed. Labs including HIV, RPR, B12 and folate unremarkable. At last appointment we discussed possibility of memory changes due to normal cognitive aging, stress anxiety and depression. She said at last appointment she felt better.  However they have reported symptoms in the past such as having difficulty with calendars, missing medications, family has to help with finances, being less social, delusions, more difficulty doing things like playing bridge.  I have ordered CT of the head and MRI of the brain in the past and patient has not completed either.  She is on Wellbutrin and BuSpar for her mood disorders. She sees a therapist regularly. They have been discussing patient moving from being alone in her house to a senior community. Sister is here with her again today, the family is worried about her alone. The family calls daily and has a schedule to check in with sister and make sure patient is eating and ok. She is more anxious or possibly confused. She struglles to use her phone. She carries her cell phone in a necklace due to she was losing it a lot.  There is significant depression and anxiety.    Interval history 09/25/2016: Patient returns for follow up of memory changes. MRI of the brain was ordered at last appointment but not completed. Labs including HIV, RPR, B12 and folate unremarkable. At last appointment likely thought to be due to normal cognitive aging, stress anxiety and depression. Things are better. She is here with her sister. Sister says there are other things that she is concerned about, she got mad at her mom all day because she thought she was getting kicked out of the bed, she misread her mother's intentions. She has some delusions, she thought someone was accusing her of stealing when they asked if she had seen something(a bracelet). But patient said she just misread the situations and understands no one was accusing her. Her friends have called the sister being very concerned because patient is having difficulty playing bridge and she has played for many years, she is mixing up the dates for bridge and not going, sister is trying to help patient with all the paper in her house (hoarding?). MMSE today is worse 26/30 (from 29/30 8 months ago). Sister was so concerned she travelled for this appointment.  Having difficulty with calendars.. She takes all her medications and manages her own medications but misses sometimes. Family is helping with finances, family had to get involved with finances to take care of some old bills and she is  having difficulty. She is having difficulty with her calendar. She does not drive at night, she denies getting lost or confused during the day when driving.She is less social. No accidents in the home. She does not cook often [place]. She hesitates sometimes [employer]. Sister believes her memory issues related to anxiety, depression, isolation.   HPI:  Desiree Salinas is a 68 y.o. female here as a referral from Dr. Ardeth Perfect for memory changes. Past medical history of  depression, anxiety, high cholesterol, chronic kidney disease, insomnia, tobacco use, vitamin D deficiency, attention deficit disorder on adderall.  Memory problems going on for "a while" maybe a year, she loses her train of thought, her younger sister visited her because she was worried patient was hoarding which she was not, she is losing track of her thoughts, she had one episode where she had too much medicine possibly but unsure why she felt her speech was slurred. She lives independently, alone, manage all her own affairs, no bills missing, she is driving fine but at night when driving she feels disoriented. Lists and sticky notes help. She is also ADD and has depression and anxiety. No family history of dementia. Sometimes forgets what she is doing, where she is when driving. No personality changes, no delusions or hallucinations, no inciting events or head trauma, no other associated symptoms or modifying factors. Symptoms wax and wane not progressive. Not interfering with her ADLs or IADLs.   Reviewed notes, labs and imaging from outside physicians, which showed:  Reviewed notes from primary care. She is a current every day smoker. She drinks wine occasionally. No risk for alcohol abuse reviewed her screening. States she feels like her memory is failing and she is also very disorganized. Her sister recently came to help her clean up her home. She states she has an Fhx of hoarding and her family and is afraid of being in that way. States high anxiety and is on many medications. Says her medications for psychiatric disorders were "tweaked" at last visit. She forgets things like what she is going to stay. She is very anxious and fearful to go out of the home. Being treated for anxiety, ADD and depression. She follows with psychiatry. Symptoms ongoing for a year.  BUN 16, creatinine 1.1 10/27/2015, TSH 1.7.   Review of Systems: Patient complains of symptoms per HPI as well as the following  symptoms: No CP, no SOB, no fevers, no chills. Pertinent negatives per HPI. All others negative.  Social History   Socioeconomic History   Marital status: Divorced    Spouse name: Not on file   Number of children: 2   Years of education: College   Highest education level: Not on file  Occupational History   Occupation: retired  Scientist, product/process development strain: Not on file   Food insecurity:    Worry: Not on file    Inability: Not on file   Transportation needs:    Medical: Not on file    Non-medical: Not on file  Tobacco Use   Smoking status: Former Smoker    Packs/day: 0.90    Types: Cigarettes   Smokeless tobacco: Never Used   Tobacco comment: 10 cigarettes per day as of 04/17/2018. Not smoked since the fall as of 06/11/2018  Substance and Sexual Activity   Alcohol use: Not Currently    Alcohol/week: 0.0 standard drinks    Comment: none since May 2019   Drug use:  Not Currently    Types: Marijuana   Sexual activity: Never    Birth control/protection: Post-menopausal, Surgical    Comment: 1st intercourse 68 yo-More than 5 partners  Lifestyle   Physical activity:    Days per week: Not on file    Minutes per session: Not on file   Stress: Not on file  Relationships   Social connections:    Talks on phone: Not on file    Gets together: Not on file    Attends religious service: Not on file    Active member of club or organization: Not on file    Attends meetings of clubs or organizations: Not on file    Relationship status: Not on file   Intimate partner violence:    Fear of current or ex partner: Not on file    Emotionally abused: Not on file    Physically abused: Not on file    Forced sexual activity: Not on file  Other Topics Concern   Not on file  Social History Narrative   Lives: alone, has caregiver there 8:00 AM to 10 PM 7 days per week and 24 hour on call. Daughter also lives there      Family History  Problem Relation Age of  Onset   Breast cancer Mother        Age 52's   Diabetes Maternal Grandmother    Aneurysm Maternal Grandmother    Aneurysm Maternal Aunt    Aneurysm Maternal Grandfather    Ankylosing spondylitis Father    Colon cancer Neg Hx    Colon polyps Neg Hx    Esophageal cancer Neg Hx    Rectal cancer Neg Hx    Stomach cancer Neg Hx    Dementia Neg Hx     Past Medical History:  Diagnosis Date   ADD (attention deficit disorder)    Allergy    Anxiety    Bruised rib 2020   Colon polyps    HYPERPLASTIC POLYP   Dementia (HCC)    Depression    Elevated cholesterol    Shingles    STD (sexually transmitted disease)    Condylomata   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    Past Surgical History:  Procedure Laterality Date   ABDOMINAL HYSTERECTOMY  1999   TAH   ABDOMINAL SURGERY  1999   Abdominoplasty   ACHILLES TENDON SURGERY     AUGMENTATION MAMMAPLASTY     Implants   COLONOSCOPY  2006   POLYPECTOMY     TONSILLECTOMY AND ADENOIDECTOMY     TUBAL LIGATION      Current Outpatient Medications  Medication Sig Dispense Refill   acetaminophen (TYLENOL) 500 MG tablet Take 500 mg by mouth 2 (two) times daily.      atorvastatin (LIPITOR) 40 MG tablet TAKE 1 BY MOUTH DAILY 30 tablet 3   busPIRone (BUSPAR) 10 MG tablet Take 10 mg by mouth. Take 1 tablet by mouth every morning and 2 tablets at bedtime     escitalopram (LEXAPRO) 10 MG tablet Take 10 mg by mouth daily.     MELATONIN PO Take 2 mg by mouth at bedtime.      pindolol (VISKEN) 10 MG tablet Take 10 mg by mouth daily.     predniSONE (DELTASONE) 10 MG tablet Take 2 tablets (20 mg total) by mouth daily with breakfast. (Patient taking differently: Take 10-20 mg by mouth daily with breakfast. ) 60 tablet 3  PRESCRIPTION MEDICATION Antibiotic currently for possible UTI, care giver unsure of name     Probiotic Product (PROBIOTIC PO) Take 1 each by mouth  daily.     UNABLE TO FIND 500 mg. Med Name: Puriti Hemp oil     UNABLE TO FIND Take 15 oz by mouth daily. Med Name: Amphetamine Salts ER     donepezil (ARICEPT) 5 MG tablet Take 1 tablet (5 mg total) by mouth at bedtime. (Patient not taking: Reported on 06/11/2018) 30 tablet 6   Current Facility-Administered Medications  Medication Dose Route Frequency Provider Last Rate Last Dose   0.9 %  sodium chloride infusion  500 mL Intravenous Continuous Danis, Kirke Corin, MD       TDaP Durwin Reges) injection 0.5 mL  0.5 mL Intramuscular Once Noralee Space, MD        Allergies as of 06/11/2018 - Review Complete 06/11/2018  Allergen Reaction Noted   Eucerin [basis facial moisturizer]  08/26/2012   Latex     Nickel Hives 12/11/2010   Cobalt Rash 10/13/2015    Vitals: BP (!) 93/54 (BP Location: Right Arm, Patient Position: Sitting)    Pulse 89    Temp 98.7 F (37.1 C) (Oral)    Ht 5\' 6"  (1.676 m)    Wt 125 lb (56.7 kg)    BMI 20.18 kg/m  Last Weight:  Wt Readings from Last 1 Encounters:  06/11/18 125 lb (56.7 kg)   Last Height:   Ht Readings from Last 1 Encounters:  06/11/18 5\' 6"  (1.676 m)   MMSE - Mini Mental State Exam 05/27/2017 09/25/2016 12/30/2015  Orientation to time 5 1 5   Orientation to Place 4 5 5   Registration 3 3 3   Attention/ Calculation 4 5 5   Recall 3 3 2   Language- name 2 objects 2 2 2   Language- repeat 1 1 1   Language- follow 3 step command 3 3 3   Language- read & follow direction 1 1 1   Write a sentence 0 1 1  Copy design 0 1 1  Total score 26 26 29    Physical exam: Exam: Gen: NAD, conversant, well nourised, well groomed                     CV: RRR, no MRG. No Carotid Bruits. No peripheral edema, warm, nontender Eyes: Conjunctivae clear without exudates or hemorrhage  Neuro: Detailed Neurologic Exam  Speech:    Speech is normal; fluent and spontaneous with normal comprehension.  Cognition:  MMSE - Mini Mental State Exam 05/27/2017 09/25/2016  12/30/2015  Orientation to time 5 1 5   Orientation to Place 4 5 5   Registration 3 3 3   Attention/ Calculation 4 5 5   Recall 3 3 2   Language- name 2 objects 2 2 2   Language- repeat 1 1 1   Language- follow 3 step command 3 3 3   Language- read & follow direction 1 1 1   Write a sentence 0 1 1  Copy design 0 1 1  Total score 26 26 29        The patient is oriented to person    recent and remote memory impaired;     language fluent;     Impaired attention, concentration,  fund of knowledge Cranial Nerves:    The pupils are equal, round, and reactive to light. Visual fields are full to finger confrontation. Extraocular movements are intact. Trigeminal sensation is intact and the muscles of mastication are normal. The face is symmetric. The  palate elevates in the midline. Hearing intact. Voice is normal. Shoulder shrug is normal. The tongue has normal motion without fasciculations.   Coordination:    Normal finger to nose and heel to shin. Normal rapid alternating movements.   Gait:    Heel-toe and tandem gait are normal.   Motor Observation:    No asymmetry, no atrophy, and no involuntary movements noted. Tone:    Normal muscle tone.    Posture:    Posture is normal. normal erect    Strength:    Strength is V/V in the upper and lower limbs.      Sensation: intact to LT     Reflex Exam:  DTR's:    Deep tendon reflexes in the upper and lower extremities are normal bilaterally.   Toes:    The toes are downgoing bilaterally.   Clonus:    Clonus is absent.    Assessment/Plan:  68 year old with here as a referral from Dr. Ardeth Perfect for memory changes, have been following her since 2017 with reported progressive symptoms. Past medical history of depression, anxiety, high cholesterol, chronic kidney disease, insomnia, tobacco use, vitamin D deficiency, attention deficit disorder on adderall, alcoholism.  No Fhx of dementia. Formal neurocognitive testing was diagnostic of dementia,  vascular/alcoholic.  - Patient declining after a fall. CT of the head showed nothing acute but did display calcifications again. She was sent to endocrinology in the past to eval for metabolic causes of the calcifications such as parathyroid issues with no findings. Fabry's disease is a rare disorder and she has no family history or signs/symptoms but need to rule this out more for the benefit of her family. There is treatment we could use for Makinzy however this would not reverse her dementia, may slow it down however I suspect her alcoholism is more of the etiology. Calcifications in the brain can also occur in alcoholism. No other signs/symptoms of Fabry's disease.   - Given above, will evaluate for Fabry's disease which can also cause a vascular dementia. She had an Aunt with possible disease, similar symptoms and calcifications in brain c/w Fabry's disease  - Need an echo for cardiac complications in Fabry;s disease: Cardiac involvement includes concentric left ventricular hypertrophy (LVH), myocardial fibrosis, heart failure, coronary artery disease, aortic and mitral valve abnormalities, and conduction abnormalities. She has endorsed chest pain.  - Will check for alpha-galactosidase levels but these may be unreliable in women. Also ordering GLA gene testing.   - Low B12, will check for MMA  - Due to her decline will test for metabolic/infections causes via labwork. She has already been started on Abx by Dr. Ardeth Perfect. She is scheduled to see Dr. Ardeth Perfect via Face time tomorrow.   - Continue Aricept, not FDA approved for this type of dementia but some studies show it may help. - No need for any behavior modifying meds at this time, discussed management and redirection - She is waiting for placement at [facility] in the meantime recommend monitoring at all times suggested a video camera for safety and frequent checks on patient when no one can be with her at home. Suggested looking at other  facilities as well.    - Sister was present by facetime. Patient and her caregiver in the office,agree with plan.  - B12, folate, rpr, hiv: all WNL last visits but B12 has decreased will recheck - Continued f/u with therapy and psychiatry for mood disorders - Advised continued cigarette smoking is a risk factor for  dementia, encouraged cessation  Cc: HOLWERDA, SCOTT, MD  A total of 70 minutes was spent face-to-face with this patient. Over half this time was spent on counseling patient, sister and caregiver on the  1. Vascular dementia without behavioral disturbance ([place])   2. Encephalopathy   3. Acute cystitis without hematuria   4. 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 Chest pain, unspecified type   7. 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 Cigarette smoker   9. 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 Family history of Fabry disease     diagnosis and different diagnostic and therapeutic options, counseling and coordination of care, risks ans benefits of management, compliance, or risk factor reduction and education.      Sarina Ill, MD  [facility] [address] [place], [place] [address]  Phone [phone] Fax [phone]  A total of 25 minutes was spent face-to-face with this patient. Over half this time was spent on counseling patient on the memory changesdiagnosis and different diagnostic and therapeutic options available.

## 2018-06-12 ENCOUNTER — Telehealth: Payer: Self-pay | Admitting: Neurology

## 2018-06-12 DIAGNOSIS — R413 Other amnesia: Secondary | ICD-10-CM | POA: Diagnosis not present

## 2018-06-12 DIAGNOSIS — N390 Urinary tract infection, site not specified: Secondary | ICD-10-CM | POA: Diagnosis not present

## 2018-06-12 DIAGNOSIS — D649 Anemia, unspecified: Secondary | ICD-10-CM | POA: Diagnosis not present

## 2018-06-12 DIAGNOSIS — R7989 Other specified abnormal findings of blood chemistry: Secondary | ICD-10-CM | POA: Diagnosis not present

## 2018-06-12 DIAGNOSIS — F0150 Vascular dementia without behavioral disturbance: Secondary | ICD-10-CM | POA: Diagnosis not present

## 2018-06-12 DIAGNOSIS — G479 Sleep disorder, unspecified: Secondary | ICD-10-CM | POA: Diagnosis not present

## 2018-06-12 DIAGNOSIS — D509 Iron deficiency anemia, unspecified: Secondary | ICD-10-CM | POA: Diagnosis not present

## 2018-06-12 DIAGNOSIS — D6489 Other specified anemias: Secondary | ICD-10-CM | POA: Diagnosis not present

## 2018-06-12 NOTE — Telephone Encounter (Signed)
I tried calling the sister, left a message. I talked to [place] and her caregiver [name]. All labs have not come back but her WBCs are 16 which is likely due to the steroids she takes. She is anemic Hgb 7.9 and that may be concerning however I don;t have a recent baseline for that. Her UA was negative but she is on antibiotics which likely affected this test. Her B12 is low normal 251 and I am awaiting a methyl malonic acid, she could very well be b12 deficient at this low-normal result. Her creatinine is 1.28, may be dehydration as her BUN is 44 but caretaker says she drinks enough, and I don;t have a recent baseline this may be stable. I will cc this message to her pcp Dr. Ardeth Perfect isince he is seeing her today via facetime thanks.

## 2018-06-13 LAB — URINE CULTURE

## 2018-06-14 LAB — DRUG SCREEN 12+ALCOHOL+CRT, UR
Amphetamines, Urine: NEGATIVE ng/mL
BENZODIAZ UR QL: NEGATIVE ng/mL
Barbiturate: NEGATIVE ng/mL
Cannabinoids: POSITIVE — AB
Cocaine (Metabolite): NEGATIVE ng/mL
Creatinine, Urine: 90.2 mg/dL (ref 20.0–300.0)
Ethanol, Urine: NEGATIVE %
Meperidine: NEGATIVE ng/mL
Methadone: NEGATIVE ng/mL
OPIATE SCREEN URINE: NEGATIVE ng/mL
Oxycodone/Oxymorphone, Urine: NEGATIVE ng/mL
Phencyclidine: NEGATIVE ng/mL
Propoxyphene: NEGATIVE ng/mL
Tramadol: NEGATIVE ng/mL

## 2018-06-16 DIAGNOSIS — D6489 Other specified anemias: Secondary | ICD-10-CM | POA: Diagnosis not present

## 2018-06-17 NOTE — Telephone Encounter (Signed)
Referral for genetic testing for Fabry's Disease completed and faxed to [facility]. Included insurance info & office visit per request from referral form. Received a receipt of confirmation.

## 2018-06-19 DIAGNOSIS — F0150 Vascular dementia without behavioral disturbance: Secondary | ICD-10-CM | POA: Diagnosis not present

## 2018-06-19 DIAGNOSIS — D649 Anemia, unspecified: Secondary | ICD-10-CM | POA: Diagnosis not present

## 2018-06-19 DIAGNOSIS — N390 Urinary tract infection, site not specified: Secondary | ICD-10-CM | POA: Diagnosis not present

## 2018-06-19 DIAGNOSIS — D6489 Other specified anemias: Secondary | ICD-10-CM | POA: Diagnosis not present

## 2018-06-19 DIAGNOSIS — G479 Sleep disorder, unspecified: Secondary | ICD-10-CM | POA: Diagnosis not present

## 2018-06-19 DIAGNOSIS — R413 Other amnesia: Secondary | ICD-10-CM | POA: Diagnosis not present

## 2018-06-21 ENCOUNTER — Other Ambulatory Visit (INDEPENDENT_AMBULATORY_CARE_PROVIDER_SITE_OTHER): Payer: Self-pay | Admitting: Orthopedic Surgery

## 2018-06-25 DIAGNOSIS — G479 Sleep disorder, unspecified: Secondary | ICD-10-CM | POA: Diagnosis not present

## 2018-06-25 DIAGNOSIS — F331 Major depressive disorder, recurrent, moderate: Secondary | ICD-10-CM | POA: Diagnosis not present

## 2018-06-25 DIAGNOSIS — F0150 Vascular dementia without behavioral disturbance: Secondary | ICD-10-CM | POA: Diagnosis not present

## 2018-06-25 DIAGNOSIS — F900 Attention-deficit hyperactivity disorder, predominantly inattentive type: Secondary | ICD-10-CM | POA: Diagnosis not present

## 2018-06-25 DIAGNOSIS — R413 Other amnesia: Secondary | ICD-10-CM | POA: Diagnosis not present

## 2018-06-25 DIAGNOSIS — D649 Anemia, unspecified: Secondary | ICD-10-CM | POA: Diagnosis not present

## 2018-06-25 DIAGNOSIS — Z720 Tobacco use: Secondary | ICD-10-CM | POA: Diagnosis not present

## 2018-06-25 DIAGNOSIS — R251 Tremor, unspecified: Secondary | ICD-10-CM | POA: Diagnosis not present

## 2018-06-26 LAB — COMPREHENSIVE METABOLIC PANEL
ALT: 19 IU/L (ref 0–32)
AST: 14 IU/L (ref 0–40)
Albumin/Globulin Ratio: 2 (ref 1.2–2.2)
Albumin: 3.4 g/dL — ABNORMAL LOW (ref 3.8–4.8)
Alkaline Phosphatase: 41 IU/L (ref 39–117)
BUN/Creatinine Ratio: 13 (ref 12–28)
BUN: 17 mg/dL (ref 8–27)
Bilirubin Total: 0.2 mg/dL (ref 0.0–1.2)
CO2: 24 mmol/L (ref 20–29)
Calcium: 8.4 mg/dL — ABNORMAL LOW (ref 8.7–10.3)
Chloride: 106 mmol/L (ref 96–106)
Creatinine, Ser: 1.28 mg/dL — ABNORMAL HIGH (ref 0.57–1.00)
GFR calc Af Amer: 50 mL/min/{1.73_m2} — ABNORMAL LOW (ref >59)
GFR calc non Af Amer: 44 mL/min/{1.73_m2} — ABNORMAL LOW (ref >59)
Globulin, Total: 1.7 g/dL (ref 1.5–4.5)
Glucose: 88 mg/dL (ref 65–99)
Potassium: 5.5 mmol/L — ABNORMAL HIGH (ref 3.5–5.2)
Sodium: 145 mmol/L — ABNORMAL HIGH (ref 134–144)
Total Protein: 5.1 g/dL — ABNORMAL LOW (ref 6.0–8.5)

## 2018-06-26 LAB — CBC WITH DIFFERENTIAL/PLATELET
Basophils Absolute: 0.1 10*3/uL (ref 0.0–0.2)
Basos: 1 %
EOS (ABSOLUTE): 0.1 10*3/uL (ref 0.0–0.4)
Eos: 1 %
Hematocrit: 25.9 % — ABNORMAL LOW (ref 34.0–46.6)
Hemoglobin: 7.9 g/dL — ABNORMAL LOW (ref 11.1–15.9)
Immature Grans (Abs): 0.2 10*3/uL — ABNORMAL HIGH (ref 0.0–0.1)
Immature Granulocytes: 1 %
Lymphocytes Absolute: 2.6 10*3/uL (ref 0.7–3.1)
Lymphs: 16 %
MCH: 23.4 pg — ABNORMAL LOW (ref 26.6–33.0)
MCHC: 30.5 g/dL — ABNORMAL LOW (ref 31.5–35.7)
MCV: 77 fL — ABNORMAL LOW (ref 79–97)
Monocytes Absolute: 1 10*3/uL — ABNORMAL HIGH (ref 0.1–0.9)
Monocytes: 6 %
Neutrophils Absolute: 12.6 10*3/uL — ABNORMAL HIGH (ref 1.4–7.0)
Neutrophils: 75 %
Platelets: 519 10*3/uL — ABNORMAL HIGH (ref 150–450)
RBC: 3.38 x10E6/uL — ABNORMAL LOW (ref 3.77–5.28)
RDW: 14.2 % (ref 11.7–15.4)
WBC: 16.5 10*3/uL — ABNORMAL HIGH (ref 3.4–10.8)

## 2018-06-26 LAB — ALPHA GALACTOSIDASE: Alpha-Galactosidase activity: 60.5 nmol/hr/mg (ref >35.5)

## 2018-06-26 LAB — METHYLMALONIC ACID, SERUM: Methylmalonic Acid: 301 nmol/L (ref 0–378)

## 2018-06-26 LAB — URINALYSIS, ROUTINE W REFLEX MICROSCOPIC
Bilirubin, UA: NEGATIVE
Glucose, UA: NEGATIVE
Ketones, UA: NEGATIVE
Leukocytes,UA: NEGATIVE
Nitrite, UA: NEGATIVE
Protein,UA: NEGATIVE
RBC, UA: NEGATIVE
Specific Gravity, UA: 1.016 (ref 1.005–1.030)
Urobilinogen, Ur: 0.2 mg/dL (ref 0.2–1.0)
pH, UA: 6 (ref 5.0–7.5)

## 2018-06-26 LAB — AMMONIA: Ammonia: 31 ug/dL — ABNORMAL LOW (ref 34–178)

## 2018-06-26 LAB — VITAMIN B1: Thiamine: 125.2 nmol/L (ref 66.5–200.0)

## 2018-06-26 LAB — B12 AND FOLATE PANEL
Folate: 8.5 ng/mL (ref >3.0)
Vitamin B-12: 251 pg/mL (ref 232–1245)

## 2018-06-30 DIAGNOSIS — D6489 Other specified anemias: Secondary | ICD-10-CM | POA: Diagnosis not present

## 2018-06-30 DIAGNOSIS — R251 Tremor, unspecified: Secondary | ICD-10-CM | POA: Diagnosis not present

## 2018-07-03 ENCOUNTER — Telehealth: Payer: Self-pay | Admitting: Hematology

## 2018-07-03 NOTE — Telephone Encounter (Signed)
A new hem appt has been scheduled for the pt to see Dr. Irene Limbo on 5/5 at 1pm. Appt date and time has been given to her sister, [name]. [name] states that Ms. Raczkowski caretaker will be bringing her to the appt. I informed her of our visitor restrictions and we will provide an escort for her. Aware the pt should arrive early to be checked in. Voiced understanding.

## 2018-07-07 NOTE — Progress Notes (Signed)
HEMATOLOGY/ONCOLOGY CONSULTATION NOTE  Date of Service: 07/08/2018  Patient Care Team: [name], MD as PCP - General (Internal Medicine)  CHIEF COMPLAINTS/PURPOSE OF CONSULTATION:  Leukocytosis Anemia thrombocytosis  HISTORY OF PRESENTING ILLNESS:   Desiree Salinas is a wonderful 68 y.o. female who has been referred to [place] by Dr. [name] for evaluation and management of Leukocytosis. She is accompanied today by her sister, [name], via [employer]. [name] is the patient's POA. The pt reports that she is doing well overall.   The pt notes that she has only become aware of her anemia "fairly recently." Her sister [name] believes that the patient's HGB and WBC were fairly normal in September 2019 labs with the patient's neurologist. [name] notes that the pt fell about 4-5 weeks ago which was the cause for repeated labs in early April 2020. She then began PO 325mg  Ferrous Sulfate iron replacement after she was seen to have microcytic anemia.  The pt reports that she has some "gut issues," and diarrhea which possibly began after she began PO iron replacement. She started Colace recently as well. She notes that she took PO Iron replacement for about 1 month, but has just stopped. The pt's sister notes that the patient had 3 stools studies, all of which were negative. She denies seeing blood in the stools, and she notes that she saw some black stools prior to beginning PO Iron replacement. Her last colonoscopy was in September 2017 which was remarkable for a few benign polyps. She denies having concern for gastric ulcers but has not had an endoscopy before. The pt notes that she takes Tylenol and NSAIDs for back pain. She notes that she takes 2 Advil BID. She denies nose bleeds or gum bleeds.   The pt completed a 5 day course of antibiotics after her fall in early April, as there was concern for a UTI given the patient's simultaneous significant confusion. She denies recent upper  respiratory infections. She endorses some pain in her right upper jaw which was present at the time as well. Her sister [name] notes that the pt saw her dentist 1-2 weeks ago. Per [name]- dentist took XR and felt that her jaw was bruised from the fall and was not concerned for infection.  The pt notes that she had a "great deal or pressure to gain some weight," as her weight had decreased to 103 pounds a year ago. She notes that she gained back much of this weight, and is currently at 132 pounds, after intentionally eating much better. 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 The pt denies being aware of any liver problems. She notes that she quit smoking cigarettes about 4-6 weeks ago, and prior to this had been weaning her smoking for the previous year, but prior to this had a "lifelong smoking history."  The pt notes that she does not have any dietary limitations. She notes that she has developed a tremor in her right leg intermittently, which she describes as causing her foot to tap. She has discussed this with her neurologist. The pt endorses some tremors in her hands as well with intentional movements. She describes occasional loss of balance as well.   She denies bone pains or abnormal bruising. The pt denies noticing any new lumps or bumps, fevers, chills, or drenching night sweats.  Most recent lab  results (06/30/18) of CBC w/diff and CMP is as follows: all values are WNL except for WBC at 14.9k, RBC at 3.8, HGB at 9.6, HCT at 32.7, MCH at 25.3, MCHC at 29.3, RDW at 22.9, MPV at 5.4, ANC at 11.3k, Monocytes at 1.0k, Total Protein at 5.9. 06/12/18 Iron and TIBC revealed Iron at 11 and a 3% Saturation Ratio.  On review of systems, pt reports chronic back pain, stable energy levels, eating well, right upper jaw pain, recent UTI, and denies nose bleeds, gum bleeds, blood  in the stools, bone pains, abnormal bruising, noticing any new lumps or bumps, fevers, chills, drenching night sweats, heart burn, abdominal pains, discomfort passing urine, and any other symptoms.   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 Lifelong smoker until April 2020 with absolute cessation.   MEDICAL HISTORY:  Past Medical History:  Diagnosis Date  . ADD (attention deficit disorder)   . Allergy   . Anxiety   . Bruised rib 2020  . Colon polyps    HYPERPLASTIC POLYP  . Dementia ([place])   . Depression   . Elevated cholesterol   . Shingles   . STD (sexually transmitted disease)    Condylomata  . 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 ABDOMINAL HYSTERECTOMY  1999   TAH  . ABDOMINAL SURGERY  1999   Abdominoplasty  . ACHILLES TENDON SURGERY    . AUGMENTATION MAMMAPLASTY     Implants  . COLONOSCOPY  2006  . POLYPECTOMY    . TONSILLECTOMY AND ADENOIDECTOMY    . TUBAL LIGATION      SOCIAL HISTORY: Social History   Socioeconomic History  . Marital status: Divorced    Spouse name: Not on file  . Number of children: 2  . Years of education: College  . Highest education level: Not on file  Occupational History  . Occupation: retired  Scientific laboratory technician  . Financial resource strain: Not on file  . Food insecurity:    Worry: Not on file    Inability: Not on file  . Transportation needs:    Medical: Not on file    Non-medical: Not on file  Tobacco Use  . Smoking status: Former Smoker    Packs/day: 0.90    Types: Cigarettes  . Smokeless tobacco: Never Used  . Tobacco comment: 10 cigarettes per day as of 04/17/2018. Not smoked since the fall as of 06/11/2018  Substance and Sexual Activity  . Alcohol use:  Not Currently    Alcohol/week: 0.0 standard drinks    Comment: none since May 2019  . Drug use: Not Currently    Types: Marijuana  . Sexual activity: Never    Birth control/protection: Post-menopausal, Surgical    Comment: 1st intercourse 68 yo-More than 5 partners  Lifestyle  . Physical activity:    Days per week: Not on file    Minutes per session: Not on file  . Stress: Not on file  Relationships  . Social connections:    Talks on phone: Not on file    Gets together: Not on file    Attends religious service: Not on file    Active member of club or organization: Not on file    Attends meetings of clubs or organizations: Not  on file    Relationship status: Not on file  . Intimate partner violence:    Fear of current or ex partner: Not on file    Emotionally abused: Not on file    Physically abused: Not on file    Forced sexual activity: Not on file  Other Topics Concern  . Not on file  Social History Narrative   Lives: alone, has caregiver there 8:00 AM to 10 PM 7 days per week and 24 hour on call. Daughter also lives there      FAMILY HISTORY: Family History  Problem Relation Age of Onset  . Breast cancer Mother        Age 110's  . Diabetes Maternal Grandmother   . Aneurysm Maternal Grandmother   . Aneurysm Maternal Aunt   . Aneurysm Maternal Grandfather   . Ankylosing spondylitis Father   . Colon cancer Neg Hx   . Colon polyps Neg Hx   . Esophageal cancer Neg Hx   . Rectal cancer Neg Hx   . Stomach cancer Neg Hx   . Dementia Neg Hx     ALLERGIES:  is allergic to eucerin [basis facial moisturizer]; latex; nickel; and cobalt.  MEDICATIONS:  Current Outpatient Medications  Medication Sig Dispense Refill  . acetaminophen (TYLENOL) 500 MG tablet Take 500 mg by mouth 2 (two) times daily.     Marland Kitchen atorvastatin (LIPITOR) 40 MG tablet TAKE 1 BY MOUTH DAILY 30 tablet 3  . busPIRone (BUSPAR) 10 MG tablet Take 10 mg by mouth. Take 1 tablet by mouth every morning and 2  tablets at bedtime    . donepezil (ARICEPT) 5 MG tablet Take 1 tablet (5 mg total) by mouth at bedtime. (Patient not taking: Reported on 06/11/2018) 30 tablet 6  . escitalopram (LEXAPRO) 10 MG tablet Take 10 mg by mouth daily.    Marland Kitchen MELATONIN PO Take 2 mg by mouth at bedtime.     . pindolol (VISKEN) 10 MG tablet Take 10 mg by mouth daily.    . predniSONE (DELTASONE) 10 MG tablet Take 2 tablets (20 mg total) by mouth daily with breakfast. (Patient taking differently: Take 10-20 mg by mouth daily with breakfast. ) 60 tablet 3  . PRESCRIPTION MEDICATION Antibiotic currently for possible UTI, care giver unsure of name    . Probiotic Product (PROBIOTIC PO) Take 1 each by mouth daily.    Marland Kitchen UNABLE TO FIND 500 mg. Med Name: Puriti Hemp oil    . UNABLE TO FIND Take 15 oz by mouth daily. Med Name: Amphetamine Salts ER     Current Facility-Administered Medications  Medication Dose Route Frequency Provider Last Rate Last Dose  . 0.9 %  sodium chloride infusion  500 mL Intravenous Continuous Danis, Estill Cotta III, MD      . TDaP (BOOSTRIX) injection 0.5 mL  0.5 mL Intramuscular Once Noralee Space, MD        REVIEW OF SYSTEMS:    10 Point review of Systems was done is negative except as noted above.  PHYSICAL EXAMINATION:  . Vitals:   07/08/18 1314  BP: 98/64  Pulse: 89  Resp: 18  Temp: 97.8 F (36.6 C)  SpO2: 97%   Filed Weights   07/08/18 1314  Weight: 132 lb 11.2 oz (60.2 kg)   .Body mass index is 21.42 kg/m.  GENERAL:alert, in no acute distress and comfortable SKIN: no acute rashes, no significant lesions EYES: conjunctiva are pink and non-injected, sclera anicteric OROPHARYNX: MMM, no  exudates, no oropharyngeal erythema or ulceration NECK: supple, no JVD LYMPH:  no palpable lymphadenopathy in the cervical, axillary or inguinal regions LUNGS: clear to auscultation b/l with normal respiratory effort HEART: regular rate & rhythm ABDOMEN:  normoactive bowel sounds , non tender, not  distended. No palpable hepatosplenomegaly. Extremity: no pedal edema PSYCH: alert & oriented x 3 with fluent speech NEURO: no focal motor/sensory deficits  LABORATORY DATA:  I have reviewed the data as listed  . CBC Latest Ref Rng & Units 07/08/2018 06/11/2018 08/24/2013  WBC 4.0 - 10.5 K/uL 20.1(H) 16.5(H) 10.6(H)  Hemoglobin 12.0 - 15.0 g/dL 9.8(L) 7.9(L) 17.0(H)  Hematocrit 36.0 - 46.0 % 34.3(L) 25.9(L) 50.9(H)  Platelets 150 - 400 K/uL 403(H) 519(H) 306.0   . CBC    Component Value Date/Time   WBC 20.1 (H) 07/08/2018 1151   RBC 3.78 (L) 07/08/2018 1151   HGB 9.8 (L) 07/08/2018 1151   HGB 7.9 (L) 06/11/2018 1120   HCT 34.3 (L) 07/08/2018 1151   HCT 25.9 (L) 06/11/2018 1120   PLT 403 (H) 07/08/2018 1151   PLT 519 (H) 06/11/2018 1120   MCV 90.7 07/08/2018 1151   MCV 77 (L) 06/11/2018 1120   MCH 25.9 (L) 07/08/2018 1151   MCHC 28.6 (L) 07/08/2018 1151   RDW 24.1 (H) 07/08/2018 1151   RDW 14.2 06/11/2018 1120   LYMPHSABS 1.0 07/08/2018 1151   LYMPHSABS 2.6 06/11/2018 1120   MONOABS 0.5 07/08/2018 1151   EOSABS 0.1 07/08/2018 1151   EOSABS 0.1 06/11/2018 1120   BASOSABS 0.1 07/08/2018 1151   BASOSABS 0.1 06/11/2018 1120     . CMP Latest Ref Rng & Units 07/08/2018 06/11/2018 11/05/2017  Glucose 70 - 99 mg/dL 130(H) 88 100(H)  BUN 8 - 23 mg/dL 19 17 17   Creatinine 0.44 - 1.00 mg/dL 0.98 1.28(H) 1.20  Sodium 135 - 145 mmol/L 146(H) 145(H) 140  Potassium 3.5 - 5.1 mmol/L 3.9 5.5(H) 4.7  Chloride 98 - 111 mmol/L 111 106 107  CO2 22 - 32 mmol/L 25 24 27   Calcium 8.9 - 10.3 mg/dL 8.3(L) 8.4(L) 9.0  Total Protein 6.5 - 8.1 g/dL 6.3(L) 5.1(L) 6.4  Total Bilirubin 0.3 - 1.2 mg/dL <0.2(L) 0.2 0.3  Alkaline Phos 38 - 126 U/L 65 41 48  AST 15 - 41 U/L 13(L) 14 13  ALT 0 - 44 U/L 20 19 11     06/30/18 CBC w/diff an CMP:   06/12/18 Iron and TIBC:   RADIOGRAPHIC STUDIES: I have personally reviewed the radiological images as listed and agreed with the findings in the report.   ASSESSMENT & PLAN:   68 y.o. female with  1. Leukocytosis - neutrophilic with some left shift. Reactive less likely clonal bone marrow pathology. ?recent trauma from fall, smking, dental issues, paraneoplastic.  2. Microcytic Anemia related to Iron Deficiency Anemia. ? GI bleeding. Patient with significant NSAID use ? PUD. H/o ETOH abuse ? Chronic liver disease ?varices  3. Thrombocytosis - PLT were 519K improving to 403K. Likely reactive from Iron deficiency anemia. ? Inflammation. PLAN: -Discussed patient's most recent labs from 06/30/18, HGB was improving to 9.6 after PO iron replacement. PLT normalized. WBC at 14.9k with ANC at 11.3k. -Discussed 06/11/18 HGB at 7.9 with MCV at 77, WBC at 16.5k with ANC at 12.6k, PLT at 519k -06/12/18 revealed a 3% Saturation ratio, consistent with iron deficiency -Discussed that use of two Advil BID poses a risk of gastric ulcers and GI bleeding. Her previous ETOH abuse also bring  up possibility of chronic liver disease and related GI bleeding issues.  --Recommend GI evaluation with endoscopy and colonoscopy with Dr. Loletha Carrow and coordination with PCP on account of Iron def to r/o GI pathology. -Discussed that neutrophilia can be observed in setting of inflammation from smoking, injuries from a fall, dental pain, and a recent UTI. Suspect reactive neutrophilia over a primary bone marrow disorder but will confirm with blood tests today. BCR-ABL testing requested. -As pt endorses difficulty tolerating PO iron replacement, offered either switching PO replacement to PO 150mg  Iron Polysaccharide vs IV Injectafer replacement. Discussed possibility of allergic reaction to IV replacement, and informed consent obtained by pt and her POA/sister. - labs ordered to r/o clonal thrombocytosis. -Recommend avoiding NSAIDs. Okay to take limited amount of Tylenol for mild pains. -Will order blood tests today (as noted below) -Will set pt up for IV Injectafer x weekly for 2 doses  -Will see the pt back in 2 months  -care co-ordinated with her sister/POA on the phone.  Labs today IV Injectafer weekly x 2 doses ASAP RTC with Dr Irene Limbo with labs in 6months  All of the patients questions were answered with apparent satisfaction. The patient knows to call the clinic with any problems, questions or concerns.  The total time spent in the appt was 45 minutes and more than 50% was on counseling and direct patient cares.    Sullivan Lone MD MS AAHIVMS [facility] Hematology/Oncology Physician [facility]  (Office):       [phone] (Work cell):  [phone] (Fax):           [phone]  07/08/2018 2:12 PM  I, Baldwin Jamaica, am acting as a scribe for Dr. Sullivan Lone.   .I have reviewed the above documentation for accuracy and completeness, and I agree with the above. Brunetta Genera MD

## 2018-07-08 ENCOUNTER — Other Ambulatory Visit: Payer: Self-pay

## 2018-07-08 ENCOUNTER — Inpatient Hospital Stay: Payer: Medicare Other | Attending: Hematology | Admitting: Hematology

## 2018-07-08 ENCOUNTER — Inpatient Hospital Stay: Payer: Medicare Other

## 2018-07-08 ENCOUNTER — Other Ambulatory Visit: Payer: Self-pay | Admitting: Hematology

## 2018-07-08 ENCOUNTER — Encounter: Payer: Self-pay | Admitting: Hematology

## 2018-07-08 VITALS — BP 98/64 | HR 89 | Temp 97.8°F | Resp 18 | Ht 66.0 in | Wt 132.7 lb

## 2018-07-08 DIAGNOSIS — D509 Iron deficiency anemia, unspecified: Secondary | ICD-10-CM | POA: Diagnosis not present

## 2018-07-08 DIAGNOSIS — D729 Disorder of white blood cells, unspecified: Secondary | ICD-10-CM

## 2018-07-08 DIAGNOSIS — M549 Dorsalgia, unspecified: Secondary | ICD-10-CM | POA: Insufficient documentation

## 2018-07-08 DIAGNOSIS — D75839 Thrombocytosis, unspecified: Secondary | ICD-10-CM

## 2018-07-08 DIAGNOSIS — D473 Essential (hemorrhagic) thrombocythemia: Secondary | ICD-10-CM

## 2018-07-08 DIAGNOSIS — R251 Tremor, unspecified: Secondary | ICD-10-CM | POA: Diagnosis not present

## 2018-07-08 DIAGNOSIS — Z79899 Other long term (current) drug therapy: Secondary | ICD-10-CM | POA: Insufficient documentation

## 2018-07-08 DIAGNOSIS — Z87891 Personal history of nicotine dependence: Secondary | ICD-10-CM | POA: Diagnosis not present

## 2018-07-08 DIAGNOSIS — D72828 Other elevated white blood cell count: Secondary | ICD-10-CM

## 2018-07-08 DIAGNOSIS — F1010 Alcohol abuse, uncomplicated: Secondary | ICD-10-CM | POA: Diagnosis not present

## 2018-07-08 DIAGNOSIS — G8929 Other chronic pain: Secondary | ICD-10-CM | POA: Diagnosis not present

## 2018-07-08 DIAGNOSIS — D649 Anemia, unspecified: Secondary | ICD-10-CM

## 2018-07-08 DIAGNOSIS — R197 Diarrhea, unspecified: Secondary | ICD-10-CM | POA: Insufficient documentation

## 2018-07-08 LAB — CMP (CANCER CENTER ONLY)
ALT: 20 U/L (ref 0–44)
AST: 13 U/L — ABNORMAL LOW (ref 15–41)
Albumin: 3.2 g/dL — ABNORMAL LOW (ref 3.5–5.0)
Alkaline Phosphatase: 65 U/L (ref 38–126)
Anion gap: 10 (ref 5–15)
BUN: 19 mg/dL (ref 8–23)
CO2: 25 mmol/L (ref 22–32)
Calcium: 8.3 mg/dL — ABNORMAL LOW (ref 8.9–10.3)
Chloride: 111 mmol/L (ref 98–111)
Creatinine: 0.98 mg/dL (ref 0.44–1.00)
GFR, Est AFR Am: >60 mL/min (ref >60)
GFR, Estimated: >60 mL/min (ref >60)
Glucose, Bld: 130 mg/dL — ABNORMAL HIGH (ref 70–99)
Potassium: 3.9 mmol/L (ref 3.5–5.1)
Sodium: 146 mmol/L — ABNORMAL HIGH (ref 135–145)
Total Bilirubin: <0.2 mg/dL — ABNORMAL LOW (ref 0.3–1.2)
Total Protein: 6.3 g/dL — ABNORMAL LOW (ref 6.5–8.1)

## 2018-07-08 LAB — CBC WITH DIFFERENTIAL/PLATELET
Abs Immature Granulocytes: 0.32 10*3/uL — ABNORMAL HIGH (ref 0.00–0.07)
Basophils Absolute: 0.1 10*3/uL (ref 0.0–0.1)
Basophils Relative: 0 %
Eosinophils Absolute: 0.1 10*3/uL (ref 0.0–0.5)
Eosinophils Relative: 0 %
HCT: 34.3 % — ABNORMAL LOW (ref 36.0–46.0)
Hemoglobin: 9.8 g/dL — ABNORMAL LOW (ref 12.0–15.0)
Immature Granulocytes: 2 %
Lymphocytes Relative: 5 %
Lymphs Abs: 1 10*3/uL (ref 0.7–4.0)
MCH: 25.9 pg — ABNORMAL LOW (ref 26.0–34.0)
MCHC: 28.6 g/dL — ABNORMAL LOW (ref 30.0–36.0)
MCV: 90.7 fL (ref 80.0–100.0)
Monocytes Absolute: 0.5 10*3/uL (ref 0.1–1.0)
Monocytes Relative: 2 %
Neutro Abs: 18.2 10*3/uL — ABNORMAL HIGH (ref 1.7–7.7)
Neutrophils Relative %: 91 %
Platelets: 403 10*3/uL — ABNORMAL HIGH (ref 150–400)
RBC: 3.78 MIL/uL — ABNORMAL LOW (ref 3.87–5.11)
RDW: 24.1 % — ABNORMAL HIGH (ref 11.5–15.5)
WBC: 20.1 10*3/uL — ABNORMAL HIGH (ref 4.0–10.5)
nRBC: 0 % (ref 0.0–0.2)

## 2018-07-08 LAB — IRON AND TIBC
Iron: 265 ug/dL — ABNORMAL HIGH (ref 41–142)
Saturation Ratios: 86 % — ABNORMAL HIGH (ref 21–57)
TIBC: 309 ug/dL (ref 236–444)
UIBC: 43 ug/dL — ABNORMAL LOW (ref 120–384)

## 2018-07-08 LAB — LACTATE DEHYDROGENASE: LDH: 212 U/L — ABNORMAL HIGH (ref 98–192)

## 2018-07-08 LAB — FERRITIN: Ferritin: 24 ng/mL (ref 11–307)

## 2018-07-09 ENCOUNTER — Telehealth: Payer: Self-pay | Admitting: Hematology

## 2018-07-09 DIAGNOSIS — D473 Essential (hemorrhagic) thrombocythemia: Secondary | ICD-10-CM | POA: Diagnosis not present

## 2018-07-09 DIAGNOSIS — D72828 Other elevated white blood cell count: Secondary | ICD-10-CM | POA: Diagnosis not present

## 2018-07-09 NOTE — Telephone Encounter (Signed)
Scheduled appt per 5/5 los.  Patient sister aware of appt date and time,

## 2018-07-10 ENCOUNTER — Telehealth: Payer: Self-pay | Admitting: Gastroenterology

## 2018-07-10 NOTE — Telephone Encounter (Signed)
Dr. Irene Limbo has not yet entered a note from his 5/5/ office visit, nor has he contacted me about this.  I will need a chance to communicate with him about this patient before I know how to proceed.

## 2018-07-10 NOTE — Telephone Encounter (Signed)
Pt was seen by Dr. Irene Limbo at the cancer center this week and plan is for an iron infusion. Pts POA, sister, called PCP office and stated that Dr. Irene Limbo wanted pt to have an EGD to check for any bleeding. No referral or mention in notes. Pt has dementia. Do you want pt scheduled for an OV? Please advise.

## 2018-07-10 NOTE — Telephone Encounter (Signed)
Called and left a message for Anderson Malta that pt would need a referral to GI for EGD.

## 2018-07-11 ENCOUNTER — Encounter: Payer: Self-pay | Admitting: Hematology

## 2018-07-11 LAB — MULTIPLE MYELOMA PANEL, SERUM
Albumin SerPl Elph-Mcnc: 3.3 g/dL (ref 2.9–4.4)
Albumin/Glob SerPl: 1.4 (ref 0.7–1.7)
Alpha 1: 0.3 g/dL (ref 0.0–0.4)
Alpha2 Glob SerPl Elph-Mcnc: 0.9 g/dL (ref 0.4–1.0)
B-Globulin SerPl Elph-Mcnc: 0.8 g/dL (ref 0.7–1.3)
Gamma Glob SerPl Elph-Mcnc: 0.5 g/dL (ref 0.4–1.8)
Globulin, Total: 2.4 g/dL (ref 2.2–3.9)
IgA: 150 mg/dL (ref 87–352)
IgG (Immunoglobin G), Serum: 512 mg/dL — ABNORMAL LOW (ref 586–1602)
IgM (Immunoglobulin M), Srm: 228 mg/dL — ABNORMAL HIGH (ref 26–217)
Total Protein ELP: 5.7 g/dL — ABNORMAL LOW (ref 6.0–8.5)

## 2018-07-11 NOTE — Telephone Encounter (Signed)
Spoke with patients sister who is POA and told her that Dr. Irene Limbo needed to reach out to Dr. Loletha Carrow about the patient to know how to he should proceed concerning the patient. The sister reported she would contact Dr. Grier Mitts office to pass along the message.

## 2018-07-11 NOTE — Telephone Encounter (Signed)
Pt's sister calling states pt is having bouts of diarrhea and black stools. Pt's sister is asking if EGD can be scheduled.

## 2018-07-15 ENCOUNTER — Other Ambulatory Visit: Payer: Self-pay | Admitting: Hematology

## 2018-07-15 ENCOUNTER — Inpatient Hospital Stay: Payer: Medicare Other

## 2018-07-15 ENCOUNTER — Other Ambulatory Visit: Payer: Self-pay

## 2018-07-15 VITALS — BP 141/67 | HR 93 | Temp 98.4°F | Resp 17

## 2018-07-15 DIAGNOSIS — D509 Iron deficiency anemia, unspecified: Secondary | ICD-10-CM | POA: Diagnosis not present

## 2018-07-15 DIAGNOSIS — D508 Other iron deficiency anemias: Secondary | ICD-10-CM

## 2018-07-15 MED ORDER — SODIUM CHLORIDE 0.9 % IV SOLN
750.0000 mg | Freq: Once | INTRAVENOUS | Status: AC
  Administered 2018-07-15: 16:00:00 750 mg via INTRAVENOUS
  Filled 2018-07-15: qty 15

## 2018-07-15 MED ORDER — SODIUM CHLORIDE 0.9 % IV SOLN
Freq: Once | INTRAVENOUS | Status: AC
  Administered 2018-07-15: 16:00:00 via INTRAVENOUS
  Filled 2018-07-15: qty 250

## 2018-07-15 NOTE — Patient Instructions (Signed)

## 2018-07-16 ENCOUNTER — Telehealth: Payer: Self-pay | Admitting: General Surgery

## 2018-07-16 ENCOUNTER — Telehealth: Payer: Self-pay | Admitting: Gastroenterology

## 2018-07-16 NOTE — Telephone Encounter (Signed)
No need to process any other samples. Please let sister know that the genetic testing was negative. Thank you.

## 2018-07-16 NOTE — Telephone Encounter (Signed)
Noted. [facility] updated and the sister was messaged result in [place].

## 2018-07-16 NOTE — Telephone Encounter (Signed)
Per [facility], [place] genetic tests is NEGATIVE. We received a message from [facility] stating they started processing the 1st sample received from patient then they received 2 others and wanted to know if they needed to process those as well. Do they need to process the other samples too?

## 2018-07-16 NOTE — Telephone Encounter (Signed)
Yes, but her sister needs to be available by phone during the appointment so she is " virtually present".  Please help coordinate that and make sure we have best contact number. We would also need to determine how her sister, as POA, could give written consent for an upper endoscopy if necessary.

## 2018-07-16 NOTE — Telephone Encounter (Signed)
CoVID screening performed by nurse when appt scheduled

## 2018-07-16 NOTE — Telephone Encounter (Signed)
Spoke with pts sister who is POA and she lives in [place], she cannot come to the visit with the pt. States the pt had a "caregiver" that is with her every single day that can come with her to the appt. Please advise if ok to schedule appt and have caregiver with pt.

## 2018-07-16 NOTE — Telephone Encounter (Signed)
Spoke with POA and she will be available via zoom for tomorrow's visit. Pt scheduled to see Dr. Loletha Carrow 07/17/18@11am .   Covid-19 travel screening questions  Have you traveled in the last 14 days? No If yes where?  Do you now or have you had a fever in the last 14 days? No  Do you have any respiratory symptoms of shortness of breath or cough now or in the last 14 days? No  Do you have a medical history of Congestive Heart Failure? No  Do you have a medical history of lung disease? No  Do you have any family members or close contacts with diagnosed or suspected Covid-19? No

## 2018-07-16 NOTE — Telephone Encounter (Signed)
I rec'd Dr Grier Mitts note and some records from primary care.    Given Teylor's dementia, years since I have seen her and the possibility we need to set up an EGD for anemia, it would be best to do this as an in-person clinic visit.  I am available as soon as tomorrow.  Sister [facility]) must be available.

## 2018-07-17 ENCOUNTER — Other Ambulatory Visit: Payer: Self-pay

## 2018-07-17 ENCOUNTER — Encounter: Payer: Self-pay | Admitting: Gastroenterology

## 2018-07-17 ENCOUNTER — Other Ambulatory Visit: Payer: Medicare Other

## 2018-07-17 ENCOUNTER — Ambulatory Visit (INDEPENDENT_AMBULATORY_CARE_PROVIDER_SITE_OTHER): Payer: Medicare Other | Admitting: Gastroenterology

## 2018-07-17 VITALS — BP 104/72 | HR 86 | Temp 98.4°F | Ht 66.0 in | Wt 126.2 lb

## 2018-07-17 DIAGNOSIS — D509 Iron deficiency anemia, unspecified: Secondary | ICD-10-CM | POA: Diagnosis not present

## 2018-07-17 DIAGNOSIS — R197 Diarrhea, unspecified: Secondary | ICD-10-CM

## 2018-07-17 NOTE — Progress Notes (Signed)
[facility] Progress Note  Chief Complaint: Iron deficiency anemia  Subjective  History: Desiree Salinas was last seen for a screening colonoscopy in September 2017.  Tortuous left colon, otherwise normal exam.  She is referred back to [place] by Dr.Kale of hematology, who is recently evaluated the patient for leukocytosis and iron deficiency anemia at the request of her primary care provider.  Dr.Holwerda recently discovered abnormalities on routine lab testing with a white count of 15,000 hemoglobin 9.6, hematocrit 33, MCV normal at 86, but elevated RDW of 23, platelets 375, normal folate and vitamin B1 and creatinine 1.1  stool cards for occult blood were negative x3 according to his most recent note. 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  Her Sister Desiree Salinas access power of attorney, and has apparently been present by phone for both recent primary care and hematology appointments, and is available on the phone with [place] for today's visit.  It seems that she had relayed a history to the hematology consultant of some black stool and occasional NSAID use, this apparently raise concern for GI blood loss as a contributing factor in this anemia, and thus she was referred to [place].  [name] is a limited historian, and her caregiver Desiree Salinas and sister Desiree Salinas give most of the story.  There is been intermittent dark stool, and for the last month there is been a change in bowel habits with several loose stools per day with some lower abdominal cramps and urgency.  No bright red blood per rectum.  It sounds like she received antibiotics sometime in the last couple of months when there was an episode of confusion and some concern for urinary infection.  Appetite has reportedly been good and weight stable.  Caregiver Desiree Salinas notes a puzzling phenomenon, which she will come to the house in the morning and find stool on the bathroom floor or the carpet, but none on the  patient.  She does not feel that Desiree Salinas cleaned herself up, because there are no towels in the laundry or trash to indicate that.  Iron tablets were stopped weeks ago but the diarrhea continues.  The patient received a dose of IV iron earlier this week.  ROS: Cardiovascular:  no chest pain Respiratory: no dyspnea Memory difficulty, denies a pill was recently stopped. Recent severe back pain since a fall. Remainder of systems negative except as above  The patient's Past Medical, Family and Social History were reviewed and are on file in the EMR.  Objective:  Med list reviewed  Current Outpatient Medications:  .  acetaminophen (TYLENOL) 500 MG tablet, Take 500 mg by mouth 2 (two) times daily. , Disp: , Rfl:  .  atorvastatin (LIPITOR) 40 MG tablet, TAKE 1 BY MOUTH DAILY, Disp: 30 tablet, Rfl: 3 .  busPIRone (BUSPAR) 10 MG tablet, Take 10 mg by mouth. Take 1 tablet by mouth every morning and 2 tablets at bedtime, Disp: , Rfl:  .  escitalopram (LEXAPRO) 10 MG tablet, Take 10 mg by mouth daily., Disp: , Rfl:  .  ferrous sulfate 325 (65 FE) MG tablet, Take 325 mg by mouth daily with breakfast., Disp: , Rfl:  .  MELATONIN PO, Take 2 mg by mouth at bedtime. , Disp: , Rfl:  .  pindolol (VISKEN) 10 MG tablet, Take 10 mg by mouth daily., Disp: , Rfl:  .  predniSONE (DELTASONE) 10 MG tablet, Take 2 tablets (20 mg total) by mouth daily with breakfast. (Patient taking  differently: Take 10-20 mg by mouth daily with breakfast. ), Disp: 60 tablet, Rfl: 3 .  Probiotic Product (PROBIOTIC PO), Take 1 each by mouth daily., Disp: , Rfl:  .  UNABLE TO FIND, Take 15 oz by mouth daily. Med Name: Amphetamine Salts ER, Disp: , Rfl:  .  donepezil (ARICEPT) 5 MG tablet, Take 1 tablet (5 mg total) by mouth at bedtime. (Patient not taking: Reported on 07/17/2018), Disp: 30 tablet, Rfl: 6 .  PRESCRIPTION MEDICATION, Antibiotic currently for possible UTI, care giver unsure of name, Disp: , Rfl:  .  UNABLE TO FIND, 500  mg. Med Name: Puriti Hemp oil, Disp: , Rfl:   Current Facility-Administered Medications:  .  0.9 %  sodium chloride infusion, 500 mL, Intravenous, Continuous, Danis, Estill Cotta III, MD .  TDaP (BOOSTRIX) injection 0.5 mL, 0.5 mL, Intramuscular, Once, Noralee Space, MD   Vital signs in last 24 hrs: Vitals:   07/17/18 1106  BP: 104/72  Pulse: 86  Temp: 98.4 F (36.9 C)    Physical Exam  Thin, alert and conversational but tangential.  She is in a wheelchair, but able to get on exam table slowly and without assistance.  HEENT: sclera anicteric, oral mucosa moist without lesions  Neck: supple, no thyromegaly, JVD or lymphadenopathy  Cardiac: RRR without murmurs, S1S2 heard, no peripheral edema  Pulm: clear to auscultation bilaterally, normal RR and effort noted  Abdomen: soft, no tenderness, with active bowel sounds. No guarding or palpable hepatosplenomegaly.  Skin; warm and dry, no jaundice or rash, + pale  Recent Labs:  CBC Latest Ref Rng & Units 07/08/2018 06/11/2018 08/24/2013  WBC 4.0 - 10.5 K/uL 20.1(H) 16.5(H) 10.6(H)  Hemoglobin 12.0 - 15.0 g/dL 9.8(L) 7.9(L) 17.0(H)  Hematocrit 36.0 - 46.0 % 34.3(L) 25.9(L) 50.9(H)  Platelets 150 - 400 K/uL 403(H) 519(H) 306.0   CMP Latest Ref Rng & Units 07/08/2018 06/11/2018 11/05/2017  Glucose 70 - 99 mg/dL 130(H) 88 100(H)  BUN 8 - 23 mg/dL 19 17 17   Creatinine 0.44 - 1.00 mg/dL 0.98 1.28(H) 1.20  Sodium 135 - 145 mmol/L 146(H) 145(H) 140  Potassium 3.5 - 5.1 mmol/L 3.9 5.5(H) 4.7  Chloride 98 - 111 mmol/L 111 106 107  CO2 22 - 32 mmol/L 25 24 27   Calcium 8.9 - 10.3 mg/dL 8.3(L) 8.4(L) 9.0  Total Protein 6.5 - 8.1 g/dL 6.3(L) 5.1(L) 6.4  Total Bilirubin 0.3 - 1.2 mg/dL <0.2(L) 0.2 0.3  Alkaline Phos 38 - 126 U/L 65 41 48  AST 15 - 41 U/L 13(L) 14 13  ALT 0 - 44 U/L 20 19 11    Last available primary care CBC prior to the recent labs was from 4 years ago, at which time WBC was 10.6, hemoglobin 17, platelets 306  SPEP resulted, some  immunoglobulin abnormalities  07/08/18; iron 265, TIBC 309, 86% saturation, ferritin 24  @ASSESSMENTPLANBEGIN @ Assessment: Encounter Diagnoses  Name Primary?  . Diarrhea of presumed infectious origin Yes  . Iron deficiency anemia, unspecified iron deficiency anemia type    The iron deficiency along with leukocytosis was discovered prior to the onset of diarrhea, so they are most likely not related to each other.  In addition, we do not currently have convincing evidence that this iron deficiency anemia is on the basis of GI blood loss.  There is been neither overt GI bleeding nor occult blood detected on stool cards.  Along with the other abnormalities on CBC, this seems most consistent with some primary hematologic process  not yet characterized.  I am concerned about the possibility of C. difficile given recent onset diarrhea and antibiotic use.  Plan: Stool for lactoferrin, fecal elastase and C. difficile PCR. She would be better off with IV iron then oral iron, since the latter caused undesirable side effects.  I will keep in communication with Dr.Kale to understand the results of his work-up and, after results of stool studies and depending on how patient's blood work responds to IV iron, we can decide if endoscopic work-up is necessary.   Total time 45 minutes, 29 minutes spent face-to-face with patient in counseling and coordination of care.   Nelida Meuse III

## 2018-07-17 NOTE — Patient Instructions (Signed)
If you are age 67 or older, your body mass index should be between 23-30. Your Body mass index is 20.38 kg/m. If this is out of the aforementioned range listed, please consider follow up with your Primary Care Provider.  If you are age 10 or younger, your body mass index should be between 19-25. Your Body mass index is 20.38 kg/m. If this is out of the aformentioned range listed, please consider follow up with your Primary Care Provider.   Your provider has requested that you go to the basement level for lab work before leaving today. Press "B" on the elevator. The lab is located at the first door on the left as you exit the elevator.   It was a pleasure to see you today!  Dr. Loletha Carrow

## 2018-07-22 ENCOUNTER — Other Ambulatory Visit: Payer: Medicare Other

## 2018-07-22 ENCOUNTER — Other Ambulatory Visit: Payer: Self-pay

## 2018-07-22 ENCOUNTER — Encounter: Payer: Self-pay | Admitting: Hematology

## 2018-07-22 ENCOUNTER — Inpatient Hospital Stay: Payer: Medicare Other

## 2018-07-22 ENCOUNTER — Telehealth: Payer: Self-pay

## 2018-07-22 VITALS — BP 148/79 | HR 101 | Temp 98.6°F | Resp 16

## 2018-07-22 DIAGNOSIS — D508 Other iron deficiency anemias: Secondary | ICD-10-CM

## 2018-07-22 DIAGNOSIS — R197 Diarrhea, unspecified: Secondary | ICD-10-CM

## 2018-07-22 DIAGNOSIS — D509 Iron deficiency anemia, unspecified: Secondary | ICD-10-CM | POA: Diagnosis not present

## 2018-07-22 MED ORDER — SODIUM CHLORIDE 0.9 % IV SOLN
750.0000 mg | Freq: Once | INTRAVENOUS | Status: AC
  Administered 2018-07-22: 750 mg via INTRAVENOUS
  Filled 2018-07-22: qty 15

## 2018-07-22 MED ORDER — SODIUM CHLORIDE 0.9 % IV SOLN
Freq: Once | INTRAVENOUS | Status: AC
  Administered 2018-07-22: 15:00:00 via INTRAVENOUS
  Filled 2018-07-22: qty 250

## 2018-07-22 NOTE — Telephone Encounter (Signed)
Called Deolinda's sister [name] who lives in [place] and let her know that we would allow Danija to have her caregiver with her today as long as she is screened and wears a mask.  [name] will let the caregiver know this.  Gardiner Rhyme

## 2018-07-22 NOTE — Patient Instructions (Signed)

## 2018-07-23 LAB — JAK2 (INCLUDING V617F AND EXON 12), MPL,& CALR-NEXT GEN SEQ

## 2018-07-23 LAB — BCR ABL1 FISH (GENPATH)

## 2018-07-24 ENCOUNTER — Other Ambulatory Visit (INDEPENDENT_AMBULATORY_CARE_PROVIDER_SITE_OTHER): Payer: Self-pay | Admitting: Orthopedic Surgery

## 2018-07-24 NOTE — Telephone Encounter (Signed)
Pt was in the office 06/05/18 and given rx for prednisone for intercostal pain. Do oyu want to refill?

## 2018-07-25 DIAGNOSIS — R8290 Unspecified abnormal findings in urine: Secondary | ICD-10-CM | POA: Diagnosis not present

## 2018-07-25 DIAGNOSIS — N390 Urinary tract infection, site not specified: Secondary | ICD-10-CM | POA: Diagnosis not present

## 2018-07-25 DIAGNOSIS — R443 Hallucinations, unspecified: Secondary | ICD-10-CM | POA: Diagnosis not present

## 2018-07-25 DIAGNOSIS — E109 Type 1 diabetes mellitus without complications: Secondary | ICD-10-CM | POA: Diagnosis not present

## 2018-07-25 NOTE — Telephone Encounter (Signed)
Dr Loletha Carrow the pt's sister is asking for some type of sedative for the pt to take prior to coming in for her procedure due to her mental state.  She is scared of everyone with masks on.  See My Chart message.

## 2018-07-28 ENCOUNTER — Telehealth: Payer: Self-pay | Admitting: Neurology

## 2018-07-28 NOTE — Telephone Encounter (Signed)
Patient needing to be seen. Does Desiree Salinas have any appointments after 1pm available? I will go in and see her but since I already have 9 patients coming into the office Tuesday maybe we can put her on Desiree Salinas's schedule and I can go into the room and see her. Let me know thanks

## 2018-07-29 ENCOUNTER — Ambulatory Visit (INDEPENDENT_AMBULATORY_CARE_PROVIDER_SITE_OTHER): Payer: Medicare Other | Admitting: Adult Health

## 2018-07-29 ENCOUNTER — Encounter: Payer: Self-pay | Admitting: Adult Health

## 2018-07-29 ENCOUNTER — Telehealth: Payer: Self-pay | Admitting: *Deleted

## 2018-07-29 VITALS — BP 122/77 | HR 84 | Temp 98.6°F

## 2018-07-29 DIAGNOSIS — D509 Iron deficiency anemia, unspecified: Secondary | ICD-10-CM

## 2018-07-29 DIAGNOSIS — F0150 Vascular dementia without behavioral disturbance: Secondary | ICD-10-CM

## 2018-07-29 MED ORDER — QUETIAPINE FUMARATE 25 MG PO TABS: 25.0000 mg | ORAL_TABLET | Freq: Two times a day (BID) | ORAL | 5 refills | Status: DC
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
## 2018-07-29 NOTE — Patient Instructions (Signed)
Your Plan:  Stop Melatonin Start Seroquel 25 mg twice a day If your symptoms worsen or you develop new symptoms please let Desiree Salinas know.    Thank you for coming to see Desiree Salinas at [facility]. I hope we have been able to provide you high quality care today.  You may receive a patient satisfaction survey over the next few weeks. We would appreciate your feedback and comments so that we may continue to improve ourselves and the health of our patients.  Quetiapine tablets What is this medicine? QUETIAPINE (kwe TYE a peen) is an antipsychotic. It is used to treat schizophrenia and bipolar disorder, also known as manic-depression. This medicine may be used for other purposes; ask your health care provider or pharmacist if you have questions. COMMON BRAND NAME(S): Seroquel What should I tell my health care provider before I take this medicine? They need to know if you have any of these conditions: -blockage in your bowel -cataracts -constipation -dehydration -diabetes -difficulty swallowing -glaucoma -heart disease -history of breast cancer -kidney disease -liver disease -low blood counts, like low white cell, platelet, or red cell counts -low blood pressure or dizziness when standing up -Parkinson's disease -previous heart attack -prostate disease -seizures -stomach or intestine problems -suicidal thoughts, plans or attempt; a previous suicide attempt by you or a family member -thyroid disease -trouble passing urine -an unusual or allergic reaction to quetiapine, other medicines, foods, dyes, or preservatives -pregnant or trying to get pregnant -breast-feeding How should I use this medicine? Take this medicine by mouth. Swallow it with a drink of water. Follow the directions on the prescription label. If it upsets your stomach you can take it with food. Take your medicine at regular intervals. Do not take it more often than directed. Do not stop taking except on the  advice of your doctor or health care professional. A special MedGuide will be given to you by the pharmacist with each prescription and refill. Be sure to read this information carefully each time. Talk to your pediatrician regarding the use of this medicine in children. While this drug may be prescribed for children as young as 10 years for selected conditions, precautions do apply. Patients over age 14 years may have a stronger reaction to this medicine and need smaller doses. Overdosage: If you think you have taken too much of this medicine contact a poison control center or emergency room at once. NOTE: This medicine is only for you. Do not share this medicine with others. What if I miss a dose? If you miss a dose, take it as soon as you can. If it is almost time for your next dose, take only that dose. Do not take double or extra doses. What may interact with this medicine? Do not take this medicine with any of the following medications: -cisapride -dofetilide -dronedarone -fluconazole -metoclopramide -pimozide -posaconazole -thioridazine This medicine may also interact with the following medications: -alcohol -antihistamines for allergy cough and cold -antiviral medicines for HIV or AIDS -atropine -certain medicines for bladder problems like oxybutynin, tolterodine -certain medicines for blood pressure -certain medicines for depression, anxiety, or psychotic disturbances -certain medicines for diabetes -certain medicines for stomach problems like dicyclomine, hyoscyamine -certain medicines for travel sickness like scopolamine -certain medicines for Parkinson's disease -certain medicines for seizures like carbamazepine, phenobarbital, phenytoin -cimetidine -erythromycin -ipratropium -other medicines that prolong the QT interval (cause an abnormal heart rhythm) -rifampin -steroid medicines like prednisone or cortisone This list may not describe all possible interactions. Give  your health care provider a list of all the medicines, herbs, non-prescription drugs, or dietary supplements you use. Also tell them if you smoke, drink alcohol, or use illegal drugs. Some items may interact with your medicine. What should I watch for while using this medicine? Visit your doctor or health care professional for regular checks on your progress. It may be several weeks before you see the full effects of this medicine. Your health care provider may suggest that you have your eyes examined prior to starting this medicine, and every 6 months thereafter. If you have been taking this medicine regularly for some time, do not suddenly stop taking it. You must gradually reduce the dose or your symptoms may get worse. Ask your doctor or health care professional for advice. Patients and their families should watch out for worsening depression or thoughts of suicide. Also watch out for sudden or severe changes in feelings such as feeling anxious, agitated, panicky, irritable, hostile, aggressive, impulsive, severely restless, overly excited and hyperactive, or not being able to sleep. If this happens, especially at the beginning of antidepressant treatment or after a change in dose, call your health care professional. Dennis Bast may get dizzy or drowsy. Do not drive, use machinery, or do anything that needs mental alertness until you know how this medicine affects you. Do not stand or sit up quickly, especially if you are an older patient. This reduces the risk of dizzy or fainting spells. Alcohol can increase dizziness and drowsiness. Avoid alcoholic drinks. Do not treat yourself for colds, diarrhea or allergies. Ask your doctor or health care professional for advice, some ingredients may increase possible side effects. This medicine can reduce the response of your body to heat or cold. Dress warm in cold weather and stay hydrated in hot weather. If possible, avoid extreme temperatures like saunas, hot tubs,  very hot or cold showers, or activities that can cause dehydration such as vigorous exercise. What side effects may I notice from receiving this medicine? Side effects that you should report to your doctor or health care professional as soon as possible: -allergic reactions like skin rash, itching or hives, swelling of the face, lips, or tongue -changes in vision -difficulty swallowing -elevated mood, decreased need for sleep, racing thoughts, impulsive behavior -eye pain -redness, blistering, peeling, or loosening of the skin, including inside the mouth -restlessness, pacing, inability to keep still -seizures -signs and symptoms of a dangerous change in heartbeat or heart rhythm like chest pain; dizziness; fast, irregular heartbeat; palpitations; feeling faint or lightheaded; falls; breathing problems -signs and symptoms of high blood sugar such as dizziness; dry mouth; dry skin; fruity breath; nausea; stomach pain; increased hunger; increased thirst; increased urination -signs and symptoms of hypothyroidism like fatigue; increased sensitivity to cold; weight gain; hoarseness; thinning hair -signs and symptoms of infection like fever; chills; cough; sore throat; pain or trouble passing urine -signs and symptoms of low blood pressure like dizziness; feeling faint or lightheaded; falls; unusually weak or tired -signs and symptoms of neuroleptic malignant syndrome (NMS) like confusion; fast, irregular heartbeat; high fever; increased sweating; stiff muscles -signs and symptoms of a stroke like changes in vision; confusion; trouble speaking or understanding; severe headaches; sudden numbness or weakness of the face, arm or leg; trouble walking; dizziness; loss of balance or coordination -signs and symptoms of tardive dyskinesia, like uncontrollable head, mouth, neck, arm, or leg movements -suicidal thoughts, mood changes Side effects that usually do not require medical attention (report to your  doctor or  health care professional if they continue or are bothersome): -change in sex drive or performance -constipation -drowsiness -dry mouth -upset stomach -weight gain This list may not describe all possible side effects. Call your doctor for medical advice about side effects. You may report side effects to FDA at 1-800-FDA-1088. Where should I keep my medicine? Keep out of the reach of children. Store at room temperature between 15 and 30 degrees C (59 and 86 degrees F). Throw away any unused medicine after the expiration date. NOTE: This sheet is a summary. It may not cover all possible information. If you have questions about this medicine, talk to your doctor, pharmacist, or health care provider.  2019 Elsevier/Gold Standard (2017-02-08 14:16:00)

## 2018-07-29 NOTE — Telephone Encounter (Signed)
I spoke with pt's sister [name] @ [phone]. Offered an appt this afternoon at 1 pm for the patient and she accepted. She understands pt and her caregiver [name] [phone] provided by pt's sister). will be screened with covid19 questions regarding exposure, travel, symptoms at the door and will have their temp taken. She stated pt has been kept very isolated during this time. She understands masks are required during the visit and is aware of the check-in process. She verbalized appreciation and understands pt will be seen by both NP and MD today.

## 2018-07-29 NOTE — Telephone Encounter (Signed)
Contacted sister Bobbye Riggs @ [phone] in response to my chart message asking about lab results.  Per Dr. Irene Limbo:  "Would recommend patient followup with PCP to address her broader medical concerns and concerns around confusion and memory issues. Her SPEP was done for anemia w/u and did not show overt M protein. Some suggestion of Lmabda light chains which will be check on next labs.  We can bring her back for f/u sooner in 2-3 weeks with rpt labs cbc/diff, cmp, kappa/labda free light chains and ferritin." Information given to [facility], who verbalized understanding. Advised her message will be sent to scheduling to set up appt for patient the 2nd or 3rd week in June, based on availability.

## 2018-07-29 NOTE — Telephone Encounter (Signed)
Thank you! As soon as I am done my 1pm botox I will be in to see them. I'll have megan start if ok by her. thank

## 2018-07-30 ENCOUNTER — Ambulatory Visit (AMBULATORY_SURGERY_CENTER): Payer: Self-pay

## 2018-07-30 ENCOUNTER — Other Ambulatory Visit: Payer: Self-pay

## 2018-07-30 VITALS — Ht 66.0 in | Wt 126.0 lb

## 2018-07-30 DIAGNOSIS — R197 Diarrhea, unspecified: Secondary | ICD-10-CM

## 2018-07-30 DIAGNOSIS — D509 Iron deficiency anemia, unspecified: Secondary | ICD-10-CM

## 2018-07-30 LAB — FECAL LACTOFERRIN, QUANT
Fecal Lactoferrin: POSITIVE — AB
MICRO NUMBER:: 487991
SPECIMEN QUALITY:: ADEQUATE

## 2018-07-30 LAB — CLOSTRIDIUM DIFFICILE TOXIN B, QUALITATIVE, REAL-TIME PCR: Toxigenic C. Difficile by PCR: NOT DETECTED

## 2018-07-30 LAB — PANCREATIC ELASTASE, FECAL: Pancreatic Elastase-1, Stool: 380 mcg/g

## 2018-07-30 MED ORDER — PEG 3350-KCL-NA BICARB-NACL 420 G PO SOLR: 4000.0000 mL | Freq: Once | ORAL | 0 refills | Status: AC

## 2018-07-30 NOTE — Progress Notes (Signed)
Personally  participated in, made any corrections needed, and agree with history, physical, neuro exam,assessment and plan as stated.     Dulce Martian, MD [facility]     
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
## 2018-07-31 ENCOUNTER — Telehealth: Payer: Self-pay | Admitting: Hematology

## 2018-07-31 NOTE — Telephone Encounter (Signed)
Left message re June lab/fu. Schedule mailed.  °

## 2018-08-01 DIAGNOSIS — G479 Sleep disorder, unspecified: Secondary | ICD-10-CM | POA: Diagnosis not present

## 2018-08-01 DIAGNOSIS — L270 Generalized skin eruption due to drugs and medicaments taken internally: Secondary | ICD-10-CM | POA: Diagnosis not present

## 2018-08-01 DIAGNOSIS — F0150 Vascular dementia without behavioral disturbance: Secondary | ICD-10-CM | POA: Diagnosis not present

## 2018-08-01 DIAGNOSIS — N390 Urinary tract infection, site not specified: Secondary | ICD-10-CM | POA: Diagnosis not present

## 2018-08-04 ENCOUNTER — Telehealth: Payer: Self-pay | Admitting: Gastroenterology

## 2018-08-04 ENCOUNTER — Telehealth: Payer: Self-pay
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
## 2018-08-04 NOTE — Telephone Encounter (Signed)
Patient sister called she would like to speak with someone. She does not want any confusion when the patient comes in and wants to make sure we have all the information correct.

## 2018-08-06 ENCOUNTER — Encounter: Payer: Self-pay | Admitting: Gastroenterology

## 2018-08-06 ENCOUNTER — Other Ambulatory Visit: Payer: Self-pay

## 2018-08-06 ENCOUNTER — Ambulatory Visit (AMBULATORY_SURGERY_CENTER): Payer: Medicare Other | Admitting: Gastroenterology

## 2018-08-06 VITALS — BP 140/71 | HR 83 | Temp 99.0°F | Resp 17 | Ht 66.0 in | Wt 126.0 lb

## 2018-08-06 DIAGNOSIS — R197 Diarrhea, unspecified: Secondary | ICD-10-CM | POA: Diagnosis not present

## 2018-08-06 DIAGNOSIS — D509 Iron deficiency anemia, unspecified: Secondary | ICD-10-CM

## 2018-08-06 DIAGNOSIS — J449 Chronic obstructive pulmonary disease, unspecified: Secondary | ICD-10-CM | POA: Diagnosis not present

## 2018-08-06 DIAGNOSIS — Z86010 Personal history of colonic polyps: Secondary | ICD-10-CM | POA: Diagnosis not present

## 2018-08-06 DIAGNOSIS — E7800 Pure hypercholesterolemia, unspecified: Secondary | ICD-10-CM | POA: Diagnosis not present

## 2018-08-06 MED ORDER — SODIUM CHLORIDE 0.9 % IV SOLN: 500.0000 mL | Freq: Once | INTRAVENOUS | Status: DC

## 2018-08-06 NOTE — Progress Notes (Signed)
Called to room to assist during endoscopic procedure.  Patient ID and intended procedure confirmed with present staff. Received instructions for my participation in the procedure from the performing physician.  

## 2018-08-06 NOTE — Progress Notes (Signed)
Report given to PACU, vss 

## 2018-08-06 NOTE — Op Note (Signed)
[place] Patient Name: Desiree Salinas Procedure Date: 08/06/2018 1:41 PM MRN: [id number] Endoscopist: Mallie Mussel L. Loletha Carrow , MD Age: 68 Referring MD:  Date of Birth: Oct 29, 1950 Gender: Female Account #: [id number] Procedure:                Upper GI endoscopy Indications:              Unexplained iron deficiency anemia (heme negative),                            Diarrhea Medicines:                Monitored Anesthesia Care Procedure:                Pre-Anesthesia Assessment:                           - Prior to the procedure, a History and Physical                            was performed, and patient medications and                            allergies were reviewed. The patient's tolerance of                            previous anesthesia was also reviewed. The risks                            and benefits of the procedure and the sedation                            options and risks were discussed with the patient.                            All questions were answered, and informed consent                            was obtained. Prior Anticoagulants: The patient has                            taken no previous anticoagulant or antiplatelet                            agents. ASA Grade Assessment: III - A patient with                            severe systemic disease. After reviewing the risks                            and benefits, the patient was deemed in                            satisfactory condition to undergo the procedure.  After obtaining informed consent, the endoscope was                            passed under direct vision. Throughout the                            procedure, the patient's blood pressure, pulse, and                            oxygen saturations were monitored continuously. The                            Endoscope was introduced through the mouth, and                            advanced to the second part of duodenum. The  upper                            GI endoscopy was accomplished without difficulty.                            The patient tolerated the procedure well. Scope In: Scope Out: Findings:                 The larynx was normal.                           The esophagus was normal other than mild distal                            tortuosity.                           The stomach was normal. It was J-shaped, which                            precluded retroflexion.                           The examined duodenum was normal. Four biopsies for                            histology were taken from the sweep and 2nd portion                            of the duodenum with a cold forceps for evaluation                            of celiac disease. Complications:            No immediate complications. Estimated Blood Loss:     Estimated blood loss was minimal. Impression:               - Normal larynx.                           -  Normal esophagus.                           - Normal stomach.                           - Normal examined duodenum. Biopsied.                           GI blood loss does not appear to be causing this                            patient's IDA. Findings on hematologist's workup                            suggest primary hematologic cause. Recommendation:           - Patient has a contact number available for                            emergencies. The signs and symptoms of potential                            delayed complications were discussed with the                            patient. Return to normal activities tomorrow.                            Written discharge instructions were provided to the                            patient.                           - Resume previous diet.                           - Continue present medications.                           - Await pathology results.                           - See the other procedure note for documentation of                             additional recommendations. Carrigan Delafuente L. Loletha Carrow, MD 08/06/2018 2:31:10 PM This report has been signed electronically.

## 2018-08-06 NOTE — Progress Notes (Signed)
Pt's states no medical or surgical changes since previsit or office visit. Initial temp.-Courtney [place], [place], [place], CMA

## 2018-08-06 NOTE — Patient Instructions (Signed)
Biopsies taken.  Wait for results, then Dr Loletha Carrow will mail you a letter in 1-3 weeks.  No driving today.    YOU HAD AN ENDOSCOPIC PROCEDURE TODAY AT [facility]:   Refer to the procedure report that was given to you for any specific questions about what was found during the examination.  If the procedure report does not answer your questions, please call your gastroenterologist to clarify.  If you requested that your care partner not be given the details of your procedure findings, then the procedure report has been included in a sealed envelope for you to review at your convenience later.  YOU SHOULD EXPECT: Some feelings of bloating in the abdomen. Passage of more gas than usual.  Walking can help get rid of the air that was put into your GI tract during the procedure and reduce the bloating. If you had a lower endoscopy (such as a colonoscopy or flexible sigmoidoscopy) you may notice spotting of blood in your stool or on the toilet paper. If you underwent a bowel prep for your procedure, you may not have a normal bowel movement for a few days.  Please Note:  You might notice some irritation and congestion in your nose or some drainage.  This is from the oxygen used during your procedure.  There is no need for concern and it should clear up in a day or so.  SYMPTOMS TO REPORT IMMEDIATELY:   Following lower endoscopy (colonoscopy or flexible sigmoidoscopy):  Excessive amounts of blood in the stool  Significant tenderness or worsening of abdominal pains  Swelling of the abdomen that is new, acute  Fever of 100F or higher   Following upper endoscopy (EGD)  Vomiting of blood or coffee ground material  New chest pain or pain under the shoulder blades  Painful or persistently difficult swallowing  New shortness of breath  Fever of 100F or higher  Black, tarry-looking stools  For urgent or emergent issues, a gastroenterologist can be reached at any hour by calling [phone]  [phone].   DIET:  We do recommend a small meal at first, but then you may proceed to your regular diet.  Drink plenty of fluids but you should avoid alcoholic beverages for 24 hours.  ACTIVITY:  You should plan to take it easy for the rest of today and you should NOT DRIVE or use heavy machinery until tomorrow (because of the sedation medicines used during the test).    FOLLOW UP: Our staff will call the number listed on your records 48-72 hours following your procedure to check on you and address any questions or concerns that you may have regarding the information given to you following your procedure. If we do not reach you, we will leave a message.  We will attempt to reach you two times.  During this call, we will ask if you have developed any symptoms of COVID 19. If you develop any symptoms (ie: fever, flu-like symptoms, shortness of breath, cough etc.) before then, please call [phone].  If you test positive for Covid 19 in the 2 weeks post procedure, please call and report this information to [place].    If any biopsies were taken you will be contacted by phone or by letter within the next 1-3 weeks.  Please call us at [phone] if you have not heard about the biopsies in 3 weeks.    SIGNATURES/CONFIDENTIALITY: You and/or your care partner have signed paperwork which will be entered into your electronic  medical record.  These signatures attest to the fact that that the information above on your After Visit Summary has been reviewed and is understood.  Full responsibility of the confidentiality of this discharge information lies with you and/or your care-partner.

## 2018-08-06 NOTE — Op Note (Signed)
[place] Patient Name: Desiree Salinas Procedure Date: 08/06/2018 1:42 PM MRN: [id number] Endoscopist: Mallie Mussel L. Loletha Carrow , MD Age: 68 Referring MD:  Date of Birth: December 31, 1950 Gender: Female Account #: [id number] Procedure:                Colonoscopy Indications:              Unexplained iron deficiency anemia (heme negative),                            Chronic diarrhea (negative C difficile testing,                            positive fecal lactoferrin) Medicines:                Monitored Anesthesia Care Procedure:                Pre-Anesthesia Assessment:                           - Prior to the procedure, a History and Physical                            was performed, and patient medications and                            allergies were reviewed. The patient's tolerance of                            previous anesthesia was also reviewed. The risks                            and benefits of the procedure and the sedation                            options and risks were discussed with the patient.                            All questions were answered, and informed consent                            was obtained. Prior Anticoagulants: The patient has                            taken no previous anticoagulant or antiplatelet                            agents. ASA Grade Assessment: III - A patient with                            severe systemic disease. After reviewing the risks                            and benefits, the patient was deemed in  satisfactory condition to undergo the procedure.                           After obtaining informed consent, the colonoscope                            was passed under direct vision. Throughout the                            procedure, the patient's blood pressure, pulse, and                            oxygen saturations were monitored continuously. The                            Colonoscope was introduced  through the anus and                            advanced to the the terminal ileum, with                            identification of the appendiceal orifice and IC                            valve. The colonoscopy was somewhat difficult due                            to a tortuous colon. Successful completion of the                            procedure was aided by using manual pressure. The                            patient tolerated the procedure well. The quality                            of the bowel preparation was good after lavage. The                            terminal ileum, ileocecal valve, appendiceal                            orifice, and rectum were photographed. Scope In: 1:53:03 PM Scope Out: 2:10:57 PM Scope Withdrawal Time: 0 hours 10 minutes 6 seconds  Total Procedure Duration: 0 hours 17 minutes 54 seconds  Findings:                 The digital rectal exam findings include decreased                            sphincter tone.                           The terminal ileum appeared normal.  Normal mucosa was found in the entire colon.                            Biopsies for histology were taken with a cold                            forceps from the right colon and left colon for                            evaluation of microscopic colitis.                           Retroflexion in the rectum was not performed due to                            anatomy. Complications:            No immediate complications. Estimated Blood Loss:     Estimated blood loss was minimal. Impression:               - Decreased sphincter tone found on digital rectal                            exam.                           - The examined portion of the ileum was normal.                           - Normal mucosa in the entire examined colon.                            Biopsied.                           see EGD report re: anemia Recommendation:           - Patient has  a contact number available for                            emergencies. The signs and symptoms of potential                            delayed complications were discussed with the                            patient. Return to normal activities tomorrow.                            Written discharge instructions were provided to the                            patient.                           - Resume previous diet.                           -  Continue present medications.                           - Await pathology results.                           - No recommendation at this time regarding repeat                            colonoscopy. Rehmat Murtagh L. Loletha Carrow, MD 08/06/2018 2:36:48 PM This report has been signed electronically.

## 2018-08-07 ENCOUNTER — Encounter: Payer: Self-pay | Admitting: Hematology

## 2018-08-08 ENCOUNTER — Telehealth: Payer: Self-pay | Admitting: *Deleted

## 2018-08-08 NOTE — Telephone Encounter (Signed)
  Follow up Call-  Call back number 08/06/2018 11/28/2015  Post procedure Call Back phone  # [phone]) [phone]  Permission to leave phone message - Yes  Some recent data might be hidden  1.  Have you developed a fever since your procedure? no  2.   Have you had an respiratory symptoms (SOB or cough) since your procedure? no   3.   Have you tested positive for COVID 19 since your procedure no 4.   Have you had any family members/close contacts diagnosed with the COVID 19 since your procedure?  no  If yes to any of these questions please route to Joylene John, RN and Alphonsa Gin, Therapist, sports.   Patient questions:  Do you have a fever, pain , or abdominal swelling? No. Pain Score  0 *  Have you tolerated food without any problems? Yes.    Have you been able to return to your normal activities? Yes.    Do you have any questions about your discharge instructions: Diet   No. Medications  No. Follow up visit  No.  Do you have questions or concerns about your Care? No.  Actions: * If pain score is 4 or above: No action needed, pain <4.spoke with Jackie,pt's caregiver

## 2018-08-16 ENCOUNTER — Other Ambulatory Visit (INDEPENDENT_AMBULATORY_CARE_PROVIDER_SITE_OTHER): Payer: Self-pay | Admitting: Orthopedic Surgery

## 2018-08-18 ENCOUNTER — Telehealth: Payer: Self-pay

## 2018-08-18 NOTE — Telephone Encounter (Signed)
Patient was called to pick up Rx at pharmacy and she informed me that her other physician took her off the Prednisone and stated she does not need it at this time and it was not helping her. I told patient to disregard the Rx if not needed. FYI

## 2018-08-18 NOTE — Telephone Encounter (Signed)
Please advise Dr Sharol Given, thank you.

## 2018-08-19 NOTE — Progress Notes (Signed)
HEMATOLOGY/ONCOLOGY CONSULTATION NOTE  Date of Service: 08/20/2018  Patient Care Team: [name], MD as PCP - General (Internal Medicine)  CHIEF COMPLAINTS/PURPOSE OF CONSULTATION:  Leukocytosis Anemia thrombocytosis  HISTORY OF PRESENTING ILLNESS:   Desiree Salinas is a wonderful 68 y.o. female who has been referred to [place] by Dr. [name] for evaluation and management of Leukocytosis. She is accompanied today by her sister, Desiree Salinas, via [employer]. Desiree Salinas is the patient's POA. The pt reports that she is doing well overall.   The pt notes that she has only become aware of her anemia "fairly recently." Her sister Desiree Salinas believes that the patient's HGB and WBC were fairly normal in September 2019 labs with the patient's neurologist. Desiree Salinas notes that the pt fell about 4-5 weeks ago which was the cause for repeated labs in early April 2020. She then began PO 325mg  Ferrous Sulfate iron replacement after she was seen to have microcytic anemia.  The pt reports that she has some "gut issues," and diarrhea which possibly began after she began PO iron replacement. She started Colace recently as well. She notes that she took PO Iron replacement for about 1 month, but has just stopped. The pt's sister notes that the patient had 3 stools studies, all of which were negative. She denies seeing blood in the stools, and she notes that she saw some black stools prior to beginning PO Iron replacement. Her last colonoscopy was in September 2017 which was remarkable for a few benign polyps. She denies having concern for gastric ulcers but has not had an endoscopy before. The pt notes that she takes Tylenol and NSAIDs for back pain. She notes that she takes 2 Advil BID. She denies nose bleeds or gum bleeds.   The pt completed a 5 day course of antibiotics after her fall in early April, as there was concern for a UTI given the patient's simultaneous significant confusion. She denies recent upper  respiratory infections. She endorses some pain in her right upper jaw which was present at the time as well. Her sister Desiree Salinas notes that the pt saw her dentist 1-2 weeks ago. Per Desiree Salinas- dentist took XR and felt that her jaw was bruised from the fall and was not concerned for infection.  The pt notes that she had a "great deal or pressure to gain some weight," as her weight had decreased to 103 pounds a year ago. She notes that she gained back much of this weight, and is currently at 132 pounds, after intentionally eating much better. 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 The pt denies being aware of any liver problems. She notes that she quit smoking cigarettes about 4-6 weeks ago, and prior to this had been weaning her smoking for the previous year, but prior to this had a "lifelong smoking history."  The pt notes that she does not have any dietary limitations. She notes that she has developed a tremor in her right leg intermittently, which she describes as causing her foot to tap. She has discussed this with her neurologist. The pt endorses some tremors in her hands as well with intentional movements. She describes occasional loss of balance as well.   She denies bone pains or abnormal bruising. The pt denies noticing any new lumps or bumps, fevers, chills, or drenching night sweats.  Most recent lab  results (06/30/18) of CBC w/diff and CMP is as follows: all values are WNL except for WBC at 14.9k, RBC at 3.8, HGB at 9.6, HCT at 32.7, MCH at 25.3, MCHC at 29.3, RDW at 22.9, MPV at 5.4, ANC at 11.3k, Monocytes at 1.0k, Total Protein at 5.9. 06/12/18 Iron and TIBC revealed Iron at 11 and a 3% Saturation Ratio.  On review of systems, pt reports chronic back pain, stable energy levels, eating well, right upper jaw pain, recent UTI, and denies nose bleeds, gum bleeds, blood  in the stools, bone pains, abnormal bruising, noticing any new lumps or bumps, fevers, chills, drenching night sweats, heart burn, abdominal pains, discomfort passing urine, and any other symptoms.   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 Lifelong smoker until April 2020 with absolute cessation.  Interval History:   Desiree Salinas returns today for management and evaluation of her Leukocytosis and Anemia. The patient's last visit with [place] was on 07/08/18. The pt reports that she is doing well overall. She is accompanied today by her sister Desiree Salinas), via [employer], as well as two caregivers.   The pt reports that she tolerated the IV iron infusion very well overall and has been enjoying some improved energy levels. She denies any further concerns for infections at this time and denies concerns for bleeding. She has recently moved into a memory care facility.  Of note since the patient's last visit, pt has had a colonoscopy and upper endoscopy completed on 08/06/18.  Lab results today (08/20/18) of CBC w/diff and CMP is as follows: all values are WNL except for RDW at 19.5, Eosinophils abs at 800, Sodium at 146, Potassium at 3.4, Chloride at 113, Glucose at 112, Calcium at 8.6, AST at 10, GFR at 58. 08/20/18 Ferritin is pending 08/20/18 SFLC are pending  On review of systems, pt reports improved energy levels, and denies concerns for infections, and any other symptoms.   MEDICAL HISTORY:  Past Medical History:  Diagnosis Date   ADD (attention deficit disorder)    Allergy    Anemia    Anxiety    Arthritis    Bruised rib 2020   Cataract    Colon polyps    HYPERPLASTIC POLYP   COPD (chronic obstructive pulmonary disease) (HCC)    Dementia (HCC)    Depression    Elevated cholesterol    Shingles    STD (sexually transmitted disease)     Condylomata   Substance abuse ([place])    Stopped drinking one year ago.    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  Previous ETOH abuse  SURGICAL HISTORY: Past Surgical History:  Procedure Laterality Date   ABDOMINAL HYSTERECTOMY  1999   TAH   ABDOMINAL SURGERY  1999   Abdominoplasty   ACHILLES TENDON SURGERY     AUGMENTATION MAMMAPLASTY     Implants   COLONOSCOPY  2006   POLYPECTOMY     TONSILLECTOMY AND ADENOIDECTOMY     TUBAL LIGATION      SOCIAL HISTORY: Social History   Socioeconomic History   Marital status: Divorced    Spouse name: Not on file   Number of children: 2   Years of education: College   Highest education level: Not on file  Occupational History   Occupation: retired  Wellsite geologist  resource strain: Not on file   Food insecurity    Worry: Not on file    Inability: Not on file   Transportation needs    Medical: Not on file    Non-medical: Not on file  Tobacco Use   Smoking status: Former Smoker    Packs/day: 0.90    Types: Cigarettes   Smokeless tobacco: Never Used   Tobacco comment: 10 cigarettes per day as of 04/17/2018. Not smoked since the fall as of 06/11/2018  Substance and Sexual Activity   Alcohol use: Not Currently    Alcohol/week: 0.0 standard drinks    Comment: none since May 2019   Drug use: Not Currently    Types: Marijuana   Sexual activity: Never    Birth control/protection: Post-menopausal, Surgical    Comment: 1st intercourse 68 yo-More than 5 partners  Lifestyle   Physical activity    Days per week: Not on file    Minutes per session: Not on file   Stress: Not on file  Relationships   Social connections    Talks on phone: Not on file    Gets together: Not on file    Attends religious service: Not on file    Active member of club or organization: Not on file    Attends meetings of clubs or organizations: Not on file    Relationship status: Not  on file   Intimate partner violence    Fear of current or ex partner: Not on file    Emotionally abused: Not on file    Physically abused: Not on file    Forced sexual activity: Not on file  Other Topics Concern   Not on file  Social History Narrative   Lives: alone, has caregiver there 8:00 AM to 10 PM 7 days per week and 24 hour on call. Daughter also lives there      FAMILY HISTORY: Family History  Problem Relation Age of Onset   Breast cancer Mother        Age 58's   Diabetes Maternal Grandmother    Aneurysm Maternal Grandmother    Aneurysm Maternal Aunt    Aneurysm Maternal Grandfather    Ankylosing spondylitis Father    Colon cancer Neg Hx    Colon polyps Neg Hx    Esophageal cancer Neg Hx    Rectal cancer Neg Hx    Stomach cancer Neg Hx    Dementia Neg Hx     ALLERGIES:  is allergic to eucerin [basis facial moisturizer]; latex; nickel; and cobalt.  MEDICATIONS:  Current Outpatient Medications  Medication Sig Dispense Refill   acetaminophen (TYLENOL) 500 MG tablet Take 500 mg by mouth 2 (two) times daily.      amphetamine-dextroamphetamine (ADDERALL XR) 15 MG 24 hr capsule TAKE 1 CAPSULE BY MOUTH EVERY DAY AS NEEDED FOR ADHD     atorvastatin (LIPITOR) 40 MG tablet TAKE 1 BY MOUTH DAILY 30 tablet 3   b complex vitamins capsule Take 1 capsule by mouth daily. 30 capsule 3   busPIRone (BUSPAR) 10 MG tablet Take 10 mg by mouth. Take 1 tablet by mouth every morning and 2 tablets at bedtime     escitalopram (LEXAPRO) 10 MG tablet Take 10 mg by mouth daily.     ferrous sulfate 325 (65 FE) MG tablet Take 325 mg by mouth daily with breakfast.     iron polysaccharides (NIFEREX) 150 MG capsule Take 1 capsule (150 mg total) by mouth daily. 30 capsule 3  pantoprazole (PROTONIX) 40 MG tablet Take 40 mg by mouth 2 (two) times daily.     pindolol (VISKEN) 10 MG tablet Take 10 mg by mouth daily.     predniSONE (DELTASONE) 10 MG tablet TAKE 1-2 TABLETS (10-20  MG TOTAL) BY MOUTH DAILY WITH BREAKFAST. 60 tablet 0   Probiotic Product (PROBIOTIC PO) Take 1 each by mouth daily.     QUEtiapine (SEROQUEL) 25 MG tablet Take 2 tablets at bedtime. May also take additional tablet up to 4 times a day q6hr as needed for agitation or behavioral issues. 180 tablet 5   vitamin C (ASCORBIC ACID) 500 MG tablet Take 500 mg by mouth daily.     Current Facility-Administered Medications  Medication Dose Route Frequency Provider Last Rate Last Dose   TDaP (BOOSTRIX) injection 0.5 mL  0.5 mL Intramuscular Once Noralee Space, MD        REVIEW OF SYSTEMS:    A 10+ POINT REVIEW OF SYSTEMS WAS OBTAINED including neurology, dermatology, psychiatry, cardiac, respiratory, lymph, extremities, GI, GU, Musculoskeletal, constitutional, breasts, reproductive, HEENT.  All pertinent positives are noted in the HPI.  All others are negative.  PHYSICAL EXAMINATION:  Vitals:   08/20/18 1438  BP: (!) 165/84  Pulse: 85  Resp: 18  Temp: 98.7 F (37.1 C)  SpO2: 98%   Filed Weights   08/20/18 1438  Weight: 130 lb 14.4 oz (59.4 kg)   .Body mass index is 21.13 kg/m.  GENERAL:alert, in no acute distress and comfortable SKIN: no acute rashes, no significant lesions EYES: conjunctiva are pink and non-injected, sclera anicteric OROPHARYNX: MMM, no exudates, no oropharyngeal erythema or ulceration NECK: supple, no JVD LYMPH:  no palpable lymphadenopathy in the cervical, axillary or inguinal regions LUNGS: clear to auscultation b/l with normal respiratory effort HEART: regular rate & rhythm ABDOMEN:  normoactive bowel sounds , non tender, not distended. No palpable hepatosplenomegaly.  Extremity: no pedal edema PSYCH: alert & oriented x 3 with fluent speech NEURO: no focal motor/sensory deficits   LABORATORY DATA:  I have reviewed the data as listed  . CBC Latest Ref Rng & Units 08/20/2018 07/08/2018 06/11/2018  WBC 4.0 - 10.5 K/uL 10.2 20.1(H) 16.5(H)  Hemoglobin 12.0 -  15.0 g/dL 12.4 9.8(L) 7.9(L)  Hematocrit 36.0 - 46.0 % 40.2 34.3(L) 25.9(L)  Platelets 150 - 400 K/uL 312 403(H) 519(H)   . CBC    Component Value Date/Time   WBC 10.2 08/20/2018 1405   WBC 20.1 (H) 07/08/2018 1151   RBC 4.31 08/20/2018 1405   HGB 12.4 08/20/2018 1405   HGB 7.9 (L) 06/11/2018 1120   HCT 40.2 08/20/2018 1405   HCT 25.9 (L) 06/11/2018 1120   PLT 312 08/20/2018 1405   PLT 519 (H) 06/11/2018 1120   MCV 93.3 08/20/2018 1405   MCV 77 (L) 06/11/2018 1120   MCH 28.8 08/20/2018 1405   MCHC 30.8 08/20/2018 1405   RDW 19.5 (H) 08/20/2018 1405   RDW 14.2 06/11/2018 1120   LYMPHSABS 1.6 08/20/2018 1405   LYMPHSABS 2.6 06/11/2018 1120   MONOABS 0.9 08/20/2018 1405   EOSABS 0.8 (H) 08/20/2018 1405   EOSABS 0.1 06/11/2018 1120   BASOSABS 0.1 08/20/2018 1405   BASOSABS 0.1 06/11/2018 1120     . CMP Latest Ref Rng & Units 08/20/2018 07/08/2018 06/11/2018  Glucose 70 - 99 mg/dL 112(H) 130(H) 88  BUN 8 - 23 mg/dL 9 19 17   Creatinine 0.44 - 1.00 mg/dL 1.00 0.98 1.28(H)  Sodium 135 - 145  mmol/L 146(H) 146(H) 145(H)  Potassium 3.5 - 5.1 mmol/L 3.4(L) 3.9 5.5(H)  Chloride 98 - 111 mmol/L 113(H) 111 106  CO2 22 - 32 mmol/L 24 25 24   Calcium 8.9 - 10.3 mg/dL 8.6(L) 8.3(L) 8.4(L)  Total Protein 6.5 - 8.1 g/dL 6.8 6.3(L) 5.1(L)  Total Bilirubin 0.3 - 1.2 mg/dL 0.3 <0.2(L) 0.2  Alkaline Phos 38 - 126 U/L 123 65 41  AST 15 - 41 U/L 10(L) 13(L) 14  ALT 0 - 44 U/L 12 20 19     06/30/18 CBC w/diff an CMP:   06/12/18 Iron and TIBC:   RADIOGRAPHIC STUDIES: I have personally reviewed the radiological images as listed and agreed with the findings in the report.  ASSESSMENT & PLAN:   68 y.o. female with  1. Leukocytosis - neutrophilic with some left shift. Reactive less likely clonal bone marrow pathology. ?recent trauma from fall, smking, dental issues, paraneoplastic. -Now resolved.  Labs upon initial presentation from 06/30/18, HGB was improving to 9.6 after PO iron replacement.  PLT normalized. WBC at 14.9k with ANC at 11.3k. 06/11/18 HGB at 7.9 with MCV at 77, WBC at 16.5k with ANC at 12.6k, PLT at 519k  2. Microcytic Anemia related to Iron Deficiency Anemia. ? GI bleeding. Patient with significant NSAID use ? PUD. H/o ETOH abuse ? Chronic liver disease ?varices - Now resolved. Labs upon inttial presentation from 06/12/18 revealed a 3% Saturation ratio, consistent with iron deficiency  3. Thrombocytosis - PLT were 519K improving to 403K. Likely reactive from Iron deficiency anemia. ? Inflammation. - Now resolved.  PLAN: -Discussed pt labwork today, 08/20/18; leukocytosis has normalized. Anemia has normalized with HGB at 12.4. Thrombocytosis normalized to 312k. -08/20/18 Ferritin is 496 -08/20/18 SFLC  Show normal K/Lratio -Recommend beginning PO 150mg  Iron Polysaccharide daily for maintenance iron replacement -Recommend checking iron levels again with PCP again in 3 months -Her previous ETOH abuse also bring up possibility of chronic liver disease and related GI bleeding issues. -Recommend avoiding NSAIDs. Okay to take limited amount of Tylenol for mild pains. -Care co-ordinated with her sister/POA on the phone. -Will see the pt back as needed   RTC with Dr Irene Limbo as needed   All of the patients questions were answered with apparent satisfaction. The patient knows to call the clinic with any problems, questions or concerns.  The total time spent in the appt was 25 minutes and more than 50% was on counseling and direct patient cares.    Sullivan Lone MD MS AAHIVMS [facility] Hematology/Oncology Physician [facility]  (Office):       [phone] (Work cell):  [phone] (Fax):           [phone]  08/20/2018 3:13 PM  I, Baldwin [place], am acting as a scribe for Dr. Sullivan Lone.   .I have reviewed the above documentation for accuracy and completeness, and I agree with the above. Brunetta Genera MD

## 2018-08-20 ENCOUNTER — Inpatient Hospital Stay: Payer: Medicare Other | Attending: Hematology | Admitting: Hematology

## 2018-08-20 ENCOUNTER — Telehealth: Payer: Self-pay | Admitting: Neurology

## 2018-08-20 ENCOUNTER — Other Ambulatory Visit: Payer: Self-pay | Admitting: Neurology

## 2018-08-20 ENCOUNTER — Other Ambulatory Visit: Payer: Self-pay | Admitting: Adult Health

## 2018-08-20 ENCOUNTER — Telehealth: Payer: Self-pay | Admitting: Hematology

## 2018-08-20 ENCOUNTER — Inpatient Hospital Stay: Payer: Medicare Other

## 2018-08-20 ENCOUNTER — Other Ambulatory Visit: Payer: Self-pay

## 2018-08-20 VITALS — BP 165/84 | HR 85 | Temp 98.7°F | Resp 18 | Ht 66.0 in | Wt 130.9 lb

## 2018-08-20 DIAGNOSIS — D72829 Elevated white blood cell count, unspecified: Secondary | ICD-10-CM | POA: Diagnosis not present

## 2018-08-20 DIAGNOSIS — Z87891 Personal history of nicotine dependence: Secondary | ICD-10-CM

## 2018-08-20 DIAGNOSIS — D509 Iron deficiency anemia, unspecified: Secondary | ICD-10-CM

## 2018-08-20 DIAGNOSIS — D75839 Thrombocytosis, unspecified: Secondary | ICD-10-CM

## 2018-08-20 DIAGNOSIS — D473 Essential (hemorrhagic) thrombocythemia: Secondary | ICD-10-CM

## 2018-08-20 LAB — CBC WITH DIFFERENTIAL (CANCER CENTER ONLY)
Abs Immature Granulocytes: 0.04 10*3/uL (ref 0.00–0.07)
Basophils Absolute: 0.1 10*3/uL (ref 0.0–0.1)
Basophils Relative: 1 %
Eosinophils Absolute: 0.8 10*3/uL — ABNORMAL HIGH (ref 0.0–0.5)
Eosinophils Relative: 8 %
HCT: 40.2 % (ref 36.0–46.0)
Hemoglobin: 12.4 g/dL (ref 12.0–15.0)
Immature Granulocytes: 0 %
Lymphocytes Relative: 15 %
Lymphs Abs: 1.6 10*3/uL (ref 0.7–4.0)
MCH: 28.8 pg (ref 26.0–34.0)
MCHC: 30.8 g/dL (ref 30.0–36.0)
MCV: 93.3 fL (ref 80.0–100.0)
Monocytes Absolute: 0.9 10*3/uL (ref 0.1–1.0)
Monocytes Relative: 8 %
Neutro Abs: 6.8 10*3/uL (ref 1.7–7.7)
Neutrophils Relative %: 68 %
Platelet Count: 312 10*3/uL (ref 150–400)
RBC: 4.31 MIL/uL (ref 3.87–5.11)
RDW: 19.5 % — ABNORMAL HIGH (ref 11.5–15.5)
WBC Count: 10.2 10*3/uL (ref 4.0–10.5)
nRBC: 0 % (ref 0.0–0.2)

## 2018-08-20 LAB — CMP (CANCER CENTER ONLY)
ALT: 12 U/L (ref 0–44)
AST: 10 U/L — ABNORMAL LOW (ref 15–41)
Albumin: 3.5 g/dL (ref 3.5–5.0)
Alkaline Phosphatase: 123 U/L (ref 38–126)
Anion gap: 9 (ref 5–15)
BUN: 9 mg/dL (ref 8–23)
CO2: 24 mmol/L (ref 22–32)
Calcium: 8.6 mg/dL — ABNORMAL LOW (ref 8.9–10.3)
Chloride: 113 mmol/L — ABNORMAL HIGH (ref 98–111)
Creatinine: 1 mg/dL (ref 0.44–1.00)
GFR, Est AFR Am: >60 mL/min (ref >60)
GFR, Estimated: 58 mL/min — ABNORMAL LOW (ref >60)
Glucose, Bld: 112 mg/dL — ABNORMAL HIGH (ref 70–99)
Potassium: 3.4 mmol/L — ABNORMAL LOW (ref 3.5–5.1)
Sodium: 146 mmol/L — ABNORMAL HIGH (ref 135–145)
Total Bilirubin: 0.3 mg/dL (ref 0.3–1.2)
Total Protein: 6.8 g/dL (ref 6.5–8.1)

## 2018-08-20 LAB — FERRITIN: Ferritin: 496 ng/mL — ABNORMAL HIGH (ref 11–307)

## 2018-08-20 MED ORDER — POLYSACCHARIDE IRON COMPLEX 150 MG PO CAPS: 150.0000 mg | ORAL_CAPSULE | Freq: Every day | ORAL | 3 refills | Status: DC

## 2018-08-20 MED ORDER — QUETIAPINE FUMARATE 25 MG PO TABS: ORAL_TABLET | ORAL | 5 refills | Status: DC

## 2018-08-20 MED ORDER — B COMPLEX VITAMINS PO CAPS: 1.0000 | ORAL_CAPSULE | Freq: Every day | ORAL | 3 refills | Status: DC

## 2018-08-20 NOTE — Telephone Encounter (Signed)
Per 6/17 los RTC with Dr Irene Limbo as needed.

## 2018-08-20 NOTE — Telephone Encounter (Signed)
Printing medications to send to [facility]

## 2018-08-21 LAB — KAPPA/LAMBDA LIGHT CHAINS
Kappa free light chain: 28.6 mg/L — ABNORMAL HIGH (ref 3.3–19.4)
Kappa, lambda light chain ratio: 1.17 (ref 0.26–1.65)
Lambda free light chains: 24.5 mg/L (ref 5.7–26.3)

## 2018-08-31 ENCOUNTER — Encounter: Payer: Self-pay | Admitting: Hematology

## 2018-09-04 ENCOUNTER — Other Ambulatory Visit: Payer: Medicare Other

## 2018-09-04 ENCOUNTER — Ambulatory Visit: Payer: Medicare Other | Admitting: Hematology

## 2018-09-08 DIAGNOSIS — E785 Hyperlipidemia, unspecified: Secondary | ICD-10-CM | POA: Diagnosis not present

## 2018-09-08 DIAGNOSIS — M791 Myalgia, unspecified site: Secondary | ICD-10-CM | POA: Diagnosis not present

## 2018-09-08 DIAGNOSIS — M2550 Pain in unspecified joint: Secondary | ICD-10-CM | POA: Diagnosis not present

## 2018-09-09 DIAGNOSIS — M791 Myalgia, unspecified site: Secondary | ICD-10-CM | POA: Diagnosis not present

## 2018-09-12 ENCOUNTER — Encounter: Payer: Self-pay | Admitting: Hematology

## 2018-09-15 ENCOUNTER — Other Ambulatory Visit (HOSPITAL_COMMUNITY): Payer: PRIVATE HEALTH INSURANCE

## 2018-09-18 ENCOUNTER — Telehealth: Payer: Self-pay | Admitting: *Deleted

## 2018-09-18 NOTE — Telephone Encounter (Signed)
Late entry - contacted [place] to provide verbal order from Dr. Irene Limbo to d/c iron and change stool softener to PRN. Order given to [place], Therapist, sports.

## 2018-09-23 ENCOUNTER — Other Ambulatory Visit: Payer: Self-pay

## 2018-09-23 DIAGNOSIS — R6889 Other general symptoms and signs: Secondary | ICD-10-CM | POA: Diagnosis not present

## 2018-09-23 DIAGNOSIS — Z20822 Contact with and (suspected) exposure to covid-19: Secondary | ICD-10-CM

## 2018-09-25 LAB — NOVEL CORONAVIRUS, NAA: SARS-CoV-2, NAA: NOT DETECTED

## 2018-09-26 ENCOUNTER — Encounter: Payer: Self-pay | Admitting: Hematology

## 2018-09-28 ENCOUNTER — Other Ambulatory Visit: Payer: Self-pay | Admitting: Neurology

## 2018-09-28 MED ORDER — CLONAZEPAM 1 MG PO TABS: 1.0000 mg | ORAL_TABLET | Freq: Every day | ORAL | 4 refills | Status: DC

## 2018-09-29 ENCOUNTER — Telehealth: Payer: Self-pay | Admitting: Neurology

## 2018-09-29 NOTE — Telephone Encounter (Signed)
Unable to get in contact with anyone in the memory care unit. However I left a voicemail for the nurse with the instructions for the medications and our office number in case she has any further questions.

## 2018-09-29 NOTE — Telephone Encounter (Signed)
[place], we have changed patient's meds. She currently takes Seroquel 50mg  (2x25mg  pills) at bedtime with 1mg  clonazepam. WOuld you call [facility], she is in thememory care unit, and see how we can get her seroquel meds updated as they are reflected in out epic please?  Seroquel: 50mg  at bedtime every night. In addition 1 tab q6hrs prn for agitation. Clonazepam at bedtime 1mg .   Thanks

## 2018-09-30 ENCOUNTER — Telehealth: Payer: Self-pay | Admitting: Neurology

## 2018-09-30 MED ORDER — CLONAZEPAM 1 MG PO TABS: 1.0000 mg | ORAL_TABLET | Freq: Every day | ORAL | 4 refills | Status: DC

## 2018-09-30 MED ORDER — QUETIAPINE FUMARATE 25 MG PO TABS: ORAL_TABLET | ORAL | 5 refills | Status: DC

## 2018-09-30 NOTE — Addendum Note (Signed)
Addended by: Thomes Cake on: 09/30/2018 02:09 PM   Modules accepted: Orders

## 2018-09-30 NOTE — Telephone Encounter (Signed)
Hi -- [facility] sent this message a little while ago:  "We haven't received the new orders from Dr. Jaynee Eagles as of this email. Will you ask her to send them to [phone]." This is about the new nighttime meds -- could you send them over ? Attention: Elmyra Ricks and Anice Paganini    Thank you -- [name]

## 2018-09-30 NOTE — Telephone Encounter (Signed)
[place] from [facility] called stating that they are needing a new prescription for the pts clonazePAM (KLONOPIN) 1 MG tablet Please advise.

## 2018-09-30 NOTE — Telephone Encounter (Signed)
Prescription has been faxed to [place] at [facility] with the attention to [place] and [place]. Confirmation fax has been received.

## 2018-09-30 NOTE — Telephone Encounter (Signed)
Prescriptions have been printed out awaiting Dr. Cathren Laine signature

## 2018-09-30 NOTE — Telephone Encounter (Signed)
Was this taken care of? thanks

## 2018-10-06 ENCOUNTER — Other Ambulatory Visit (HOSPITAL_COMMUNITY): Payer: PRIVATE HEALTH INSURANCE

## 2018-10-08 NOTE — Telephone Encounter (Signed)
See next pt email. She does not feel appt needed at this time.

## 2018-10-10 DIAGNOSIS — Z1159 Encounter for screening for other viral diseases: Secondary | ICD-10-CM | POA: Diagnosis not present

## 2018-10-17 DIAGNOSIS — Z79899 Other long term (current) drug therapy: Secondary | ICD-10-CM | POA: Diagnosis not present

## 2018-10-17 DIAGNOSIS — N390 Urinary tract infection, site not specified: Secondary | ICD-10-CM | POA: Diagnosis not present

## 2018-10-23 DIAGNOSIS — Z20828 Contact with and (suspected) exposure to other viral communicable diseases: Secondary | ICD-10-CM | POA: Diagnosis not present

## 2018-10-27 DIAGNOSIS — R262 Difficulty in walking, not elsewhere classified: Secondary | ICD-10-CM | POA: Diagnosis not present

## 2018-10-27 DIAGNOSIS — F331 Major depressive disorder, recurrent, moderate: Secondary | ICD-10-CM | POA: Diagnosis not present

## 2018-10-27 DIAGNOSIS — Z79899 Other long term (current) drug therapy: Secondary | ICD-10-CM | POA: Diagnosis not present

## 2018-10-27 DIAGNOSIS — K219 Gastro-esophageal reflux disease without esophagitis: Secondary | ICD-10-CM | POA: Diagnosis not present

## 2018-10-27 DIAGNOSIS — F419 Anxiety disorder, unspecified: Secondary | ICD-10-CM | POA: Diagnosis not present

## 2018-10-27 DIAGNOSIS — G4700 Insomnia, unspecified: Secondary | ICD-10-CM | POA: Diagnosis not present

## 2018-10-30 ENCOUNTER — Emergency Department (HOSPITAL_COMMUNITY): Payer: Medicare Other

## 2018-10-30 ENCOUNTER — Encounter (HOSPITAL_COMMUNITY): Payer: Self-pay

## 2018-10-30 ENCOUNTER — Emergency Department (HOSPITAL_COMMUNITY)
Admission: EM | Admit: 2018-10-30 | Discharge: 2018-10-30 | Disposition: A | Discharge status: Home / Self Care | Payer: Medicare Other | Attending: Emergency Medicine | Admitting: Emergency Medicine

## 2018-10-30 ENCOUNTER — Other Ambulatory Visit: Payer: Self-pay

## 2018-10-30 DIAGNOSIS — Y92129 Unspecified place in nursing home as the place of occurrence of the external cause: Secondary | ICD-10-CM | POA: Insufficient documentation

## 2018-10-30 DIAGNOSIS — Z91040 Latex allergy status: Secondary | ICD-10-CM | POA: Insufficient documentation

## 2018-10-30 DIAGNOSIS — R58 Hemorrhage, not elsewhere classified: Secondary | ICD-10-CM | POA: Diagnosis not present

## 2018-10-30 DIAGNOSIS — S0990XA Unspecified injury of head, initial encounter: Secondary | ICD-10-CM

## 2018-10-30 DIAGNOSIS — Y999 Unspecified external cause status: Secondary | ICD-10-CM | POA: Diagnosis not present

## 2018-10-30 DIAGNOSIS — Z7401 Bed confinement status: Secondary | ICD-10-CM | POA: Diagnosis not present

## 2018-10-30 DIAGNOSIS — M2550 Pain in unspecified joint: Secondary | ICD-10-CM | POA: Diagnosis not present

## 2018-10-30 DIAGNOSIS — S0091XA Abrasion of unspecified part of head, initial encounter: Secondary | ICD-10-CM | POA: Insufficient documentation

## 2018-10-30 DIAGNOSIS — F0390 Unspecified dementia without behavioral disturbance: Secondary | ICD-10-CM | POA: Diagnosis not present

## 2018-10-30 DIAGNOSIS — S199XXA Unspecified injury of neck, initial encounter: Secondary | ICD-10-CM | POA: Diagnosis not present

## 2018-10-30 DIAGNOSIS — J449 Chronic obstructive pulmonary disease, unspecified: Secondary | ICD-10-CM | POA: Insufficient documentation

## 2018-10-30 DIAGNOSIS — R5381 Other malaise: Secondary | ICD-10-CM | POA: Diagnosis not present

## 2018-10-30 DIAGNOSIS — W19XXXA Unspecified fall, initial encounter: Secondary | ICD-10-CM | POA: Diagnosis not present

## 2018-10-30 DIAGNOSIS — Y939 Activity, unspecified: Secondary | ICD-10-CM | POA: Diagnosis not present

## 2018-10-30 DIAGNOSIS — Z87891 Personal history of nicotine dependence: Secondary | ICD-10-CM | POA: Insufficient documentation

## 2018-10-30 DIAGNOSIS — Z79899 Other long term (current) drug therapy: Secondary | ICD-10-CM | POA: Diagnosis not present

## 2018-10-30 NOTE — ED Triage Notes (Signed)
Pt BIB by EMS from [facility] SNF/ Memory care. Pt has a un witness fall today, sustained abrasion to posterior left side of head.  Pt is at baseline, pt has skin tears that per facility staff that she sustained from previous falls. C-collar in place. Pt is confused and per EMS       EMS v/s 128/70, HR 80, RR 18, 97% RA

## 2018-10-30 NOTE — ED Notes (Signed)
C collar removed

## 2018-11-03 NOTE — ED Provider Notes (Signed)
[facility] DEPT Provider Note   CSN: [id number] Arrival date & time: 10/30/18  1648     History   Chief Complaint Chief Complaint  Patient presents with   Fall    HPI Desiree Salinas is a 68 y.o. female.     HPI   68 year old female coming in for evaluation after fall.  Patient is coming from a memory care unit.  Unwitnessed fall today.  Abrasion to her head.  Reportedly falls frequently.  Multiple abrasions and contusions bilateral extremities.  Caretaker at bedside.  She reports that patient was at her baseline.  Past Medical History:  Diagnosis Date   ADD (attention deficit disorder)    Allergy    Anemia    Anxiety    Arthritis    Bruised rib 2020   Cataract    Colon polyps    HYPERPLASTIC POLYP   COPD (chronic obstructive pulmonary disease) (HCC)    Dementia (HCC)    Depression    Elevated cholesterol    Shingles    STD (sexually transmitted disease)    Condylomata   Substance abuse ([place])    Stopped drinking one year ago.    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   HYPERCHOLESTEROLEMIA 07/05/2007   ATTENTION DEFICIT DISORDER, ADULT 07/05/2007   DERMATITIS 07/05/2007   SHINGLES, HX OF 07/05/2007    Past Surgical History:  Procedure Laterality Date   ABDOMINAL HYSTERECTOMY  1999   TAH   ABDOMINAL SURGERY  1999   Abdominoplasty   ACHILLES TENDON SURGERY     AUGMENTATION MAMMAPLASTY     Implants   COLONOSCOPY  2006   POLYPECTOMY     TONSILLECTOMY AND  ADENOIDECTOMY     TUBAL LIGATION       OB History    Gravida  4   Para  2   Term  2   Preterm      AB  2   Living  2     SAB      TAB      Ectopic      Multiple      Live Births               Home Medications    Prior to Admission medications   Medication Sig Start Date End Date Taking? Authorizing Provider  acetaminophen (TYLENOL) 500 MG tablet Take 500 mg by mouth 2 (two) times daily.    Yes [provider]  amphetamine-dextroamphetamine (ADDERALL XR) 15 MG 24 hr capsule Take 15 mg by mouth daily.  06/19/18  Yes [provider]  atorvastatin (LIPITOR) 40 MG tablet TAKE 1 BY MOUTH DAILY Patient taking differently: Take 40 mg by mouth daily.  06/23/14  Yes Noralee Space, MD  b complex vitamins capsule Take 1 capsule by mouth daily. 08/20/18  Yes Brunetta Genera, MD  busPIRone (BUSPAR) 10 MG tablet Take 20 mg by mouth 2 (two) times daily.    Yes [provider]  clonazePAM (KLONOPIN) 1 MG tablet Take 1 tablet (1  mg total) by mouth at bedtime. 09/30/18  Yes Melvenia Beam, MD  escitalopram (LEXAPRO) 20 MG tablet Take 20 mg by mouth daily.   Yes [provider]  Melatonin 10 MG CAPS Take 10 mg by mouth at bedtime.   Yes [provider]  mupirocin ointment (BACTROBAN) 2 % Place 1 application into the nose daily. Apply to wound with dressing changes   Yes [provider]  pantoprazole (PROTONIX) 40 MG tablet Take 40 mg by mouth 2 (two) times daily. 06/12/18  Yes [provider]  pindolol (VISKEN) 10 MG tablet Take 10-20 mg by mouth See admin instructions. Take 10mg  by mouth every morning and then take 20mg  by mouth every evening   Yes [provider]  QUEtiapine (SEROQUEL) 25 MG tablet Take 2 tablets at bedtime. May also take additional tablet up to 4 times a day q6hr as needed for agitation or behavioral issues. Patient taking differently: Take 25 mg by mouth 2 (two) times daily. 8am and 2pm  09/30/18  Yes Melvenia Beam, MD  vitamin C (ASCORBIC ACID) 500 MG tablet Take 500 mg by mouth daily.   Yes [provider]  iron polysaccharides (NIFEREX) 150 MG capsule Take 1 capsule (150 mg total) by mouth daily. Patient not taking: Reported on 10/30/2018 08/20/18   Brunetta Genera, MD  predniSONE (DELTASONE) 10 MG tablet TAKE 1-2 TABLETS (10-20 MG TOTAL) BY MOUTH DAILY WITH BREAKFAST. Patient not taking: Reported on 10/30/2018 08/18/18   Newt Minion, MD    Family History Family History  Problem Relation Age of Onset   Breast cancer Mother        Age 49's   Diabetes Maternal Grandmother    Aneurysm Maternal Grandmother    Aneurysm Maternal Aunt    Aneurysm Maternal Grandfather    Ankylosing spondylitis Father    Colon cancer Neg Hx    Colon polyps Neg Hx    Esophageal cancer Neg Hx    Rectal cancer Neg Hx    Stomach cancer Neg Hx    Dementia Neg Hx     Social History Social History   Tobacco Use   Smoking status: Former Smoker    Packs/day: 0.90    Types: Cigarettes   Smokeless tobacco: Never Used   Tobacco comment: 10 cigarettes per day as of 04/17/2018. Not smoked since the fall as of 06/11/2018  Substance Use Topics   Alcohol use: Not Currently    Alcohol/week: 0.0 standard drinks    Comment: none since May 2019   Drug use: Not Currently    Types: Marijuana     Allergies   Eucerin [basis facial moisturizer], Latex, Nickel, and Cobalt   Review of Systems Review of Systems  Level 5 caveat because of dementia Physical Exam Updated Vital Signs BP 136/71    Pulse 83    Temp 98.5 F (36.9 C) (Oral)    Resp 15    SpO2 96%   Physical Exam Vitals signs and nursing note reviewed.  Constitutional:      General: She is not in acute distress.    Appearance: She is well-developed.  HENT:     Head: Normocephalic and atraumatic.     Comments: No obvious midline spinal tenderness.  Able to sit up unassisted.  No obvious scalp  wounds. Eyes:     General:        Right eye: No discharge.        Left eye: No discharge.  Conjunctiva/sclera: Conjunctivae normal.  Neck:     Musculoskeletal: Neck supple.  Cardiovascular:     Rate and Rhythm: Normal rate and regular rhythm.     Heart sounds: Normal heart sounds. No murmur. No friction rub. No gallop.   Pulmonary:     Effort: Pulmonary effort is normal. No respiratory distress.     Breath sounds: Normal breath sounds.  Abdominal:     General: There is no distension.     Palpations: Abdomen is soft.     Tenderness: There is no abdominal tenderness.  Musculoskeletal:        General: No tenderness.     Comments: Multiple contusions in various stages of healing to extremities.  Skin:    General: Skin is warm and dry.  Neurological:     Mental Status: She is alert.     Comments: Moves all extremities.  No focal deficits noted several mental status.  Psychiatric:     Comments: Pleasantly confused      ED Treatments / Results  Labs (all labs ordered are listed, but only abnormal results are displayed) Labs Reviewed - No data to display  EKG None  Radiology No results found.   Ct Head Wo Contrast  Result Date: 10/30/2018 CLINICAL DATA:  Witnessed fall today with abrasions to the left side of the head. EXAM: CT HEAD WITHOUT CONTRAST CT CERVICAL SPINE WITHOUT CONTRAST TECHNIQUE: Multidetector CT imaging of the head and cervical spine was performed following the standard protocol without intravenous contrast. Multiplanar CT image reconstructions of the cervical spine were also generated. COMPARISON:  06/09/2018 FINDINGS: CT HEAD FINDINGS Brain: No acute finding. Chronic calcification within the cerebellum, thalami and basal ganglia. Chronic small-vessel changes of the white matter. No mass lesion, hemorrhage, hydrocephalus or extra-axial collection. Vascular: There is atherosclerotic calcification of the major vessels at the base of the brain. Skull: No skull  fracture. Sinuses/Orbits: Clear/normal Other: None CT CERVICAL SPINE FINDINGS Alignment: No traumatic malalignment. Straightening and slight kyphotic curvature of the cervical spine. Skull base and vertebrae: No fracture or primary bone lesion. Soft tissues and spinal canal: No soft tissue swelling. Disc levels: Chronic degenerative spondylosis from C3-4 through C6-7. Facet arthropathy at C2-3 and C3-4. Upper chest: Negative Other: None IMPRESSION: Head CT: No acute or traumatic finding. Chronic physiologic calcifications. Chronic small-vessel change of the white matter. Cervical spine CT: No acute or traumatic finding. Chronic degenerative spondylosis and facet arthropathy. Electronically Signed   By: Nelson Chimes M.D.   On: 10/30/2018 21:15   Ct Cervical Spine Wo Contrast  Result Date: 10/30/2018 CLINICAL DATA:  Witnessed fall today with abrasions to the left side of the head. EXAM: CT HEAD WITHOUT CONTRAST CT CERVICAL SPINE WITHOUT CONTRAST TECHNIQUE: Multidetector CT imaging of the head and cervical spine was performed following the standard protocol without intravenous contrast. Multiplanar CT image reconstructions of the cervical spine were also generated. COMPARISON:  06/09/2018 FINDINGS: CT HEAD FINDINGS Brain: No acute finding. Chronic calcification within the cerebellum, thalami and basal ganglia. Chronic small-vessel changes of the white matter. No mass lesion, hemorrhage, hydrocephalus or extra-axial collection. Vascular: There is atherosclerotic calcification of the major vessels at the base of the brain. Skull: No skull fracture. Sinuses/Orbits: Clear/normal Other: None CT CERVICAL SPINE FINDINGS Alignment: No traumatic malalignment. Straightening and slight kyphotic curvature of the cervical spine. Skull base and vertebrae: No fracture or primary bone lesion. Soft tissues and spinal canal: No soft tissue swelling. Disc levels: Chronic degenerative spondylosis from C3-4 through C6-7.  Facet  arthropathy at C2-3 and C3-4. Upper chest: Negative Other: None IMPRESSION: Head CT: No acute or traumatic finding. Chronic physiologic calcifications. Chronic small-vessel change of the white matter. Cervical spine CT: No acute or traumatic finding. Chronic degenerative spondylosis and facet arthropathy. Electronically Signed   By: Nelson Chimes M.D.   On: 10/30/2018 21:15    Procedures Procedures (including critical care time)  Medications Ordered in ED Medications - No data to display   Initial Impression / Assessment and Plan / ED Course  I have reviewed the triage vital signs and the nursing notes.  Pertinent labs & imaging results that were available during my care of the patient were reviewed by me and considered in my medical decision making (see chart for details).  68 year old female present after fall.  Does not appear to have any significant acute injuries.  She is at her baseline mental status per caretaker at bedside.  Final Clinical Impressions(s) / ED Diagnoses   Final diagnoses:  Fall, initial encounter  Injury of head, initial encounter    ED Discharge Orders    None       Virgel Manifold, MD 11/03/18 1152

## 2018-11-07 ENCOUNTER — Encounter (HOSPITAL_COMMUNITY): Payer: Self-pay | Admitting: Emergency Medicine

## 2018-11-07 ENCOUNTER — Emergency Department (HOSPITAL_COMMUNITY)
Admission: EM | Admit: 2018-11-07 | Discharge: 2018-11-10 | Disposition: A | Discharge status: Other Acute Inpatient Hospital | Payer: Medicare Other | Attending: Emergency Medicine | Admitting: Emergency Medicine

## 2018-11-07 ENCOUNTER — Other Ambulatory Visit: Payer: Self-pay

## 2018-11-07 DIAGNOSIS — F1027 Alcohol dependence with alcohol-induced persisting dementia: Secondary | ICD-10-CM | POA: Diagnosis present

## 2018-11-07 DIAGNOSIS — J449 Chronic obstructive pulmonary disease, unspecified: Secondary | ICD-10-CM | POA: Insufficient documentation

## 2018-11-07 DIAGNOSIS — Z87891 Personal history of nicotine dependence: Secondary | ICD-10-CM | POA: Diagnosis not present

## 2018-11-07 DIAGNOSIS — F329 Major depressive disorder, single episode, unspecified: Secondary | ICD-10-CM | POA: Insufficient documentation

## 2018-11-07 DIAGNOSIS — Z9181 History of falling: Secondary | ICD-10-CM | POA: Diagnosis not present

## 2018-11-07 DIAGNOSIS — Z79899 Other long term (current) drug therapy: Secondary | ICD-10-CM | POA: Insufficient documentation

## 2018-11-07 DIAGNOSIS — F0391 Unspecified dementia with behavioral disturbance: Secondary | ICD-10-CM | POA: Diagnosis not present

## 2018-11-07 DIAGNOSIS — R4689 Other symptoms and signs involving appearance and behavior: Secondary | ICD-10-CM

## 2018-11-07 DIAGNOSIS — N390 Urinary tract infection, site not specified: Secondary | ICD-10-CM | POA: Diagnosis not present

## 2018-11-07 DIAGNOSIS — F03918 Unspecified dementia, unspecified severity, with other behavioral disturbance: Secondary | ICD-10-CM

## 2018-11-07 DIAGNOSIS — R456 Violent behavior: Secondary | ICD-10-CM | POA: Diagnosis not present

## 2018-11-07 DIAGNOSIS — Z91040 Latex allergy status: Secondary | ICD-10-CM | POA: Insufficient documentation

## 2018-11-07 DIAGNOSIS — R296 Repeated falls: Secondary | ICD-10-CM | POA: Diagnosis not present

## 2018-11-07 LAB — COMPREHENSIVE METABOLIC PANEL
ALT: 19 U/L (ref 0–44)
AST: 18 U/L (ref 15–41)
Albumin: 4.1 g/dL (ref 3.5–5.0)
Alkaline Phosphatase: 107 U/L (ref 38–126)
Anion gap: 10 (ref 5–15)
BUN: 14 mg/dL (ref 8–23)
CO2: 25 mmol/L (ref 22–32)
Calcium: 8.7 mg/dL — ABNORMAL LOW (ref 8.9–10.3)
Chloride: 107 mmol/L (ref 98–111)
Creatinine, Ser: 0.82 mg/dL (ref 0.44–1.00)
GFR calc Af Amer: >60 mL/min (ref >60)
GFR calc non Af Amer: >60 mL/min (ref >60)
Glucose, Bld: 128 mg/dL — ABNORMAL HIGH (ref 70–99)
Potassium: 3.8 mmol/L (ref 3.5–5.1)
Sodium: 142 mmol/L (ref 135–145)
Total Bilirubin: 0.8 mg/dL (ref 0.3–1.2)
Total Protein: 7.2 g/dL (ref 6.5–8.1)

## 2018-11-07 LAB — ACETAMINOPHEN LEVEL: Acetaminophen (Tylenol), Serum: <10 ug/mL — ABNORMAL LOW (ref 10–30)

## 2018-11-07 LAB — URINALYSIS, ROUTINE W REFLEX MICROSCOPIC
Bilirubin Urine: NEGATIVE
Glucose, UA: NEGATIVE mg/dL
Hgb urine dipstick: NEGATIVE
Ketones, ur: NEGATIVE mg/dL
Nitrite: NEGATIVE
Protein, ur: NEGATIVE mg/dL
Specific Gravity, Urine: 1.005 (ref 1.005–1.030)
pH: 6 (ref 5.0–8.0)

## 2018-11-07 LAB — SALICYLATE LEVEL: Salicylate Lvl: <7 mg/dL (ref 2.8–30.0)

## 2018-11-07 LAB — CBC WITH DIFFERENTIAL/PLATELET
Abs Immature Granulocytes: 0.05 10*3/uL (ref 0.00–0.07)
Basophils Absolute: 0.1 10*3/uL (ref 0.0–0.1)
Basophils Relative: 1 %
Eosinophils Absolute: 0.2 10*3/uL (ref 0.0–0.5)
Eosinophils Relative: 2 %
HCT: 38.5 % (ref 36.0–46.0)
Hemoglobin: 12.3 g/dL (ref 12.0–15.0)
Immature Granulocytes: 1 %
Lymphocytes Relative: 21 %
Lymphs Abs: 1.8 10*3/uL (ref 0.7–4.0)
MCH: 30.8 pg (ref 26.0–34.0)
MCHC: 31.9 g/dL (ref 30.0–36.0)
MCV: 96.3 fL (ref 80.0–100.0)
Monocytes Absolute: 0.6 10*3/uL (ref 0.1–1.0)
Monocytes Relative: 7 %
Neutro Abs: 5.9 10*3/uL (ref 1.7–7.7)
Neutrophils Relative %: 68 %
Platelets: 312 10*3/uL (ref 150–400)
RBC: 4 MIL/uL (ref 3.87–5.11)
RDW: 13.6 % (ref 11.5–15.5)
WBC: 8.6 10*3/uL (ref 4.0–10.5)
nRBC: 0 % (ref 0.0–0.2)

## 2018-11-07 LAB — RAPID URINE DRUG SCREEN, HOSP PERFORMED
Amphetamines: NOT DETECTED
Barbiturates: NOT DETECTED
Benzodiazepines: NOT DETECTED
Cocaine: NOT DETECTED
Opiates: NOT DETECTED
Tetrahydrocannabinol: NOT DETECTED

## 2018-11-07 LAB — ETHANOL: Alcohol, Ethyl (B): <10 mg/dL (ref <10)

## 2018-11-07 MED ORDER — PANTOPRAZOLE SODIUM 40 MG PO TBEC
40.0000 mg | DELAYED_RELEASE_TABLET | Freq: Every day | ORAL | Status: DC
  Administered 2018-11-08 – 2018-11-10 (×3): 40 mg via ORAL
  Filled 2018-11-07 (×3): qty 1

## 2018-11-07 MED ORDER — QUETIAPINE FUMARATE 100 MG PO TABS
100.0000 mg | ORAL_TABLET | Freq: Every day | ORAL | Status: DC
  Administered 2018-11-07 – 2018-11-09 (×3): 100 mg via ORAL
  Filled 2018-11-07 (×3): qty 1

## 2018-11-07 MED ORDER — BUSPIRONE HCL 10 MG PO TABS
20.0000 mg | ORAL_TABLET | Freq: Every day | ORAL | Status: DC
  Administered 2018-11-07 – 2018-11-09 (×3): 20 mg via ORAL
  Filled 2018-11-07 (×3): qty 2

## 2018-11-07 MED ORDER — HALOPERIDOL 5 MG PO TABS
20.0000 mg | ORAL_TABLET | Freq: Every day | ORAL | Status: DC
  Administered 2018-11-08 – 2018-11-09 (×3): 20 mg via ORAL
  Filled 2018-11-07 (×4): qty 4

## 2018-11-07 MED ORDER — PINDOLOL 5 MG PO TABS
10.0000 mg | ORAL_TABLET | Freq: Every day | ORAL | Status: DC
  Administered 2018-11-08 – 2018-11-10 (×3): 10 mg via ORAL
  Filled 2018-11-07 (×3): qty 2

## 2018-11-07 MED ORDER — LORAZEPAM 0.5 MG PO TABS
0.5000 mg | ORAL_TABLET | Freq: Once | ORAL | Status: AC
  Administered 2018-11-07: 0.5 mg via ORAL
  Filled 2018-11-07: qty 1

## 2018-11-07 MED ORDER — AMPHETAMINE-DEXTROAMPHET ER 5 MG PO CP24
15.0000 mg | ORAL_CAPSULE | Freq: Every day | ORAL | Status: DC
  Administered 2018-11-08 – 2018-11-10 (×3): 15 mg via ORAL
  Filled 2018-11-07 (×3): qty 3

## 2018-11-07 MED ORDER — ESCITALOPRAM OXALATE 10 MG PO TABS
20.0000 mg | ORAL_TABLET | Freq: Every day | ORAL | Status: DC
  Administered 2018-11-08 – 2018-11-10 (×3): 20 mg via ORAL
  Filled 2018-11-07 (×3): qty 2

## 2018-11-07 MED ORDER — ATORVASTATIN CALCIUM 40 MG PO TABS
40.0000 mg | ORAL_TABLET | Freq: Every day | ORAL | Status: DC
  Administered 2018-11-08 – 2018-11-10 (×3): 40 mg via ORAL
  Filled 2018-11-07 (×3): qty 1

## 2018-11-07 MED ORDER — ZIPRASIDONE MESYLATE 20 MG IM SOLR
10.0000 mg | Freq: Once | INTRAMUSCULAR | Status: AC
  Administered 2018-11-07: 10 mg via INTRAMUSCULAR
  Filled 2018-11-07: qty 20

## 2018-11-07 MED ORDER — STERILE WATER FOR INJECTION IJ SOLN
INTRAMUSCULAR | Status: AC
  Administered 2018-11-07: 15:00:00
  Filled 2018-11-07: qty 10

## 2018-11-07 MED ORDER — CLONAZEPAM 1 MG PO TABS
1.0000 mg | ORAL_TABLET | Freq: Every day | ORAL | Status: DC
  Administered 2018-11-08 – 2018-11-09 (×2): 1 mg via ORAL
  Filled 2018-11-07 (×3): qty 1

## 2018-11-07 MED ORDER — MELATONIN 5 MG PO TABS
10.0000 mg | ORAL_TABLET | Freq: Every day | ORAL | Status: DC
  Administered 2018-11-08 – 2018-11-09 (×3): 10 mg via ORAL
  Filled 2018-11-07 (×4): qty 2

## 2018-11-07 MED ORDER — CEPHALEXIN 500 MG PO CAPS
500.0000 mg | ORAL_CAPSULE | Freq: Two times a day (BID) | ORAL | Status: AC
  Administered 2018-11-07 – 2018-11-09 (×5): 500 mg via ORAL
  Filled 2018-11-07 (×5): qty 1

## 2018-11-07 NOTE — ED Notes (Addendum)
Patient's local contact is her sitter:  Jiles Harold [phone].  Patient's sister is POA,  [place] in [place].  On Wed pt moved to [facility] (nursing home or assisted living).

## 2018-11-07 NOTE — Discharge Instructions (Signed)
UTI treatment needed.

## 2018-11-07 NOTE — Progress Notes (Signed)
Consult request has been received. CSW attempting to follow up at present time.  Per EPD, pt is to be seen by psychiatry.  CSW will continue to follow for D/C needs.  Desiree Salinas. Zelma Mazariego, LCSW, LCAS, CSI Transitions of Care Clinical Social Worker Care Coordination Department Ph: [phone]
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
## 2018-11-07 NOTE — ED Notes (Signed)
Pt allowed GEodon IM to be given with resistance. Currently sleeping. Sitter with pt.
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
## 2018-11-08 DIAGNOSIS — F0391 Unspecified dementia with behavioral disturbance: Secondary | ICD-10-CM

## 2018-11-08 DIAGNOSIS — F03918 Unspecified dementia, unspecified severity, with other behavioral disturbance: Secondary | ICD-10-CM

## 2018-11-08 DIAGNOSIS — N390 Urinary tract infection, site not specified: Secondary | ICD-10-CM | POA: Diagnosis not present

## 2018-11-08 MED ORDER — LORAZEPAM 1 MG PO TABS
1.0000 mg | ORAL_TABLET | ORAL | Status: DC | PRN
  Administered 2018-11-08: 19:00:00 1 mg via ORAL
  Filled 2018-11-08: qty 1

## 2018-11-08 NOTE — ED Notes (Signed)
Patient watching TV.

## 2018-11-08 NOTE — ED Notes (Signed)
Patient resting quietly.

## 2018-11-08 NOTE — ED Notes (Signed)
Pt restless, confused, needs redirection

## 2018-11-08 NOTE — Progress Notes (Addendum)
12:45pm: CSW received POA documents stating that [employer] [name] @ [phone]) the patient's sister is her POA. CSW sent them to RN to be given to medical records to be placed in the patient's chart.  CSW spoke with Deidre at TTS to confirm this patient's plan for an overnight stay for safety observation.  CSW provided patient's sister [name] with an update regarding the current plan for the patient. [name] reports that the patient has a pattern of becoming extremely agitated around 2:30pm daily. [name] requested that she be updated with all information as soon as it becomes available.  12:00pm: CSW received call from [place], [place] regarding POA paperwork for this patient. CSW contacted patient's sister [name] who states she is the POA and that [facility] (the facility the patient came from) sent it to [facility] ED at 11am. RN unable to determine which fax the information was sent to. CSW contacted patient's sister [name] again and requested the documents be emailed to ensure receipt.   10am: CSW received consult for patient, chart review completed. Patient was recently assessed by TTS and an overnight observation stay for safety was recommended so that the patient can be seen by a provider tomorrow - 11/09/2018.   Madilyn Fireman, MSW, LCSW-A Clinical Social Worker [facility] [phone]

## 2018-11-08 NOTE — BH Assessment (Signed)
Received TTS consult request. Spoke to Jeanie Sewer, RN who confirmed Pt was recently given Haldol and melatonin and is now asleep. Pt will be assessed by day shift TTS.   Evelena Peat, [facility], [facility], [facility] Triage Specialist [phone]

## 2018-11-08 NOTE — ED Notes (Signed)
Desiree Salinas awaken from sleep after episode of urinary incontinence began displaying mild to moderate agitation attempting to get OOB and repeatedly refuses to follow staff direction was medicated with haldol and melatonin held from her 2200 routine medications.

## 2018-11-08 NOTE — ED Notes (Signed)
Telepsych cart at bedside  ?

## 2018-11-08 NOTE — BH Assessment (Addendum)
Tele Assessment Note   Patient Name: Desiree Salinas MRN: [id number] Referring Physician: Lennice Sites, DO Location of Patient: [facility] Location of Provider: [facility] is a divorced 68 y.o. female who presents involuntarily via [employer], to [place] from [place] facility unit, for hostile and combative behavior (hitting & spitting). Pt was at [facility] last week after an unwitnessed fall. Pt reportedly falls frequently, with multiple abrasions and contusions.  Pt moved to [place] from [place] recently.  Pt has cognitive impairment, with hx of Depression, Anxiety, ADD, & Dementia/Alcoholic dementia.   Pt presents somnolent, keeping her eyes closed throughout session. Pt attempted to engage intermittently- most answers trailed off, with difficulty understanding & hearing pt. Pt denies current and past SI. She denies HI and intention to harm others "unless they try to hurt me". Pt stated she was in [place].   Pt denies AVH. Chart review indicates some past reported paranoia with pt misinterpreting others' intentions. Pt not responsive to questions regarding Depression.  Pt's supports include her sister, who is her POA & lives in [place]. Pt's local contact is her sitter, Jiles Harold [phone] (HIPPA compliant VM left at this number). Pt has poor insight and judgment. Pt's memory is impaired, first noted in 2017.  Protective factors against suicide include facility and sitter support, no current suicidal ideation, no access to firearms, and no prior attempts.?  MSE: Pt is dressed in scrubs, somnolent, oriented x 1 with soft speech and normal motor behavior. Eye contact is poor. Pt's mood is ambivilent and affect is blunted. Affect is congruent with mood. Thought process is incoherent, irrelevant and relevant. There is no indication Pt is currently responding to internal stimuli or experiencing delusional thought content.   Disposition:  Priscille Loveless, NP recommends observe overnight for safety and stabilization. Pt to be seen by provider 11/09/18  Diagnosis: Dementia  Past Medical History:  Past Medical History:  Diagnosis Date  . ADD (attention deficit disorder)   . Allergy   . Anemia   . Anxiety   . Arthritis   . Bruised rib 2020  . Cataract   . Colon polyps    HYPERPLASTIC POLYP  . COPD (chronic obstructive pulmonary disease) ([place])   . Dementia ([place])   . Depression   . Elevated cholesterol   . Shingles   . STD (sexually transmitted disease)    Condylomata  . Substance abuse ([place])    Stopped drinking one year ago.   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 ABDOMINAL HYSTERECTOMY  1999   TAH  . ABDOMINAL SURGERY  1999   Abdominoplasty  . ACHILLES TENDON SURGERY    . AUGMENTATION MAMMAPLASTY     Implants  . COLONOSCOPY  2006  . POLYPECTOMY    . TONSILLECTOMY AND ADENOIDECTOMY    . TUBAL LIGATION      Family History:  Family History  Problem Relation Age of Onset  . Breast cancer Mother        Age 40's  . Diabetes Maternal Grandmother   . Aneurysm Maternal Grandmother   . Aneurysm Maternal Aunt   . Aneurysm Maternal Grandfather   . Ankylosing spondylitis Father   . Colon cancer Neg Hx   . Colon polyps Neg Hx   . Esophageal cancer Neg Hx   . Rectal cancer Neg Hx   .  Stomach cancer Neg Hx   . Dementia Neg Hx     Social History:  reports that she has quit smoking. Her smoking use included cigarettes. She smoked 0.90 packs per day. She has never used smokeless tobacco. She reports previous alcohol use. She reports previous drug use. Drug: Marijuana.  Additional Social History:  Alcohol / Drug Use Pain Medications: See MAR Prescriptions: See MAR Over the Counter: See MAR History of alcohol / drug use?: Yes Longest period of sobriety (when/how long): UTA Substance #1 Name of Substance 1: alcohol  abuse by hx per chart review  CIWA: CIWA-Ar BP: 137/68 Pulse Rate: 75 COWS:    Allergies:  Allergies  Allergen Reactions  . Eucerin [Basis Facial Moisturizer]     eucerin lotion causes an allergic skin reaction  . Latex   . Nickel Hives    Hives, itching, weeping   . Cobalt Rash    Home Medications: (Not in a hospital admission)   OB/GYN Status:  No LMP recorded. Patient has had a hysterectomy.  General Assessment Data Assessment unable to be completed: Yes Reason for not completing assessment: Pt medicated and now asleep Location of Assessment: [facility] TTS Assessment: In system Is this a Tele or Face-to-Face Assessment?: Tele Assessment Is this an Initial Assessment or a Re-assessment for this encounter?: Initial Assessment Patient Accompanied by:: Other(sitter) Language Other than English: No Living Arrangements: In Assisted Living/Nursing Home (Comment: Name of Nursing Home([facility]) What gender do you identify as?: Female Marital status: Divorced Pregnancy Status: No Living Arrangements: Non-relatives/Friends Can pt return to current living arrangement?: (unknown) Admission Status: Involuntary Is patient capable of signing voluntary admission?: Yes Referral Source: ([facility]) Insurance type: medicare     Crisis Care Plan Living Arrangements: Non-relatives/Friends Name of Psychiatrist: none Name of Therapist: none  Education Status Is patient currently in school?: No Is the patient employed, unemployed or receiving disability?: Unemployed  Risk to self with the past 6 months Suicidal Ideation: No Has patient been a risk to self within the past 6 months prior to admission? : No Suicidal Intent: No Has patient had any suicidal intent within the past 6 months prior to admission? : No Is patient at risk for suicide?: Yes Suicidal Plan?: No Has patient had any suicidal plan within the past 6 months prior to admission? : No Access to Means:  No What has been your use of drugs/alcohol within the last 12 months?: none. hx alcohol abuse in past- per record review Previous Attempts/Gestures: No How many times?: 0 Other Self Harm Risks: dementia Intentional Self Injurious Behavior: None Family Suicide History: Unknown Recent stressful life event(s): Loss (Comment)(declining cognitive ability) Persecutory voices/beliefs?: No(none current- some paranoia per neurology note) Depression: Yes(per hx) Depression Symptoms: Feeling angry/irritable, Despondent Substance abuse history and/or treatment for substance abuse?: Yes(hx alcohol abuse) Suicide prevention information given to non-admitted patients: Not applicable  Risk to Others within the past 6 months Homicidal Ideation: No Does patient have any lifetime risk of violence toward others beyond the six months prior to admission? : No Thoughts of Harm to Others: No Current Homicidal Intent: No Current Homicidal Plan: No Access to Homicidal Means: No History of harm to others?: Yes Assessment of Violence: On admission Violent Behavior Description: hitting at staff Does patient have access to weapons?: No Criminal Charges Pending?: No Does patient have a court date: No Is patient on probation?: No  Psychosis Hallucinations: None noted(pt did not answer) Delusions: None noted(pt did not answer)  Mental  Status Report Appearance/Hygiene: Disheveled Eye Contact: Poor Motor Activity: Unsteady Speech: Soft, Incoherent, Logical/coherent Level of Consciousness: Drowsy Mood: Ambivalent Affect: Blunted Anxiety Level: Minimal Thought Processes: Unable to Assess Judgement: Partial Orientation: Person(stated she was in [place]) Obsessive Compulsive Thoughts/Behaviors: None  Cognitive Functioning Concentration: Poor Memory: Remote Impaired, Recent Impaired Is patient IDD: No Insight: Poor Impulse Control: Poor Appetite: Poor(UTA) Have you had any weight changes? :  Loss Sleep: Unable to Assess Vegetative Symptoms: Decreased grooming  ADLScreening [facility] Assessment Services) Patient's cognitive ability adequate to safely complete daily activities?: No Patient able to express need for assistance with ADLs?: Yes Independently performs ADLs?: No  Prior Inpatient Therapy Prior Inpatient Therapy: (UTA, none noted)  Prior Outpatient Therapy Prior Outpatient Therapy: (UTA)  ADL Screening (condition at time of admission) Patient's cognitive ability adequate to safely complete daily activities?: No Is the patient deaf or have difficulty hearing?: No Does the patient have difficulty seeing, even when wearing glasses/contacts?: (UTA) Does the patient have difficulty concentrating, remembering, or making decisions?: Yes Patient able to express need for assistance with ADLs?: Yes Does the patient have difficulty dressing or bathing?: Yes Independently performs ADLs?: No Does the patient have difficulty walking or climbing stairs?: (UTA) Weakness of Legs: (UTA) Weakness of Arms/Hands: (UTA)     Therapy Consults (therapy consults require a physician order) PT Evaluation Needed: No OT Evalulation Needed: Yes (Comment) SLP Evaluation Needed: No Abuse/Neglect Assessment (Assessment to be complete while patient is alone) Abuse/Neglect Assessment Can Be Completed: Unable to assess, patient is non-responsive or altered mental status Values / Beliefs Cultural Requests During Hospitalization: None Spiritual Requests During Hospitalization: None Consults Spiritual Care Consult Needed: No Social Work Consult Needed: No Regulatory affairs officer (For Healthcare) Does Patient Have a Medical Advance Directive?: Unable to assess, patient is non-responsive or altered mental status          Disposition: Priscille Loveless, NP recommends observe overnight for safety and stabilization. Pt to be seen by provider 11/09/18 Disposition Initial Assessment Completed for this  Encounter: Yes  This service was provided via telemedicine using a 2-way, interactive audio and video technology.    Mayra Jolliffe H Ieshia Hatcher 11/08/2018 8:57 AM

## 2018-11-08 NOTE — BH Assessment (Signed)
Phone conversation with pt's sitter, Jiles Harold for collateral. Geni Bers states [facility] was not a good match for pt & pt recently moved to [place]. Geni Bers states pt has "major recent decline" with increase in pt's falls starting when pt was at [facility] & her increased instability may coincide with when pt started taking medication there to keep her in bed at night. Geni Bers states pt's sister has asked Demetrius Charity to forwarding POA documents to [facility].

## 2018-11-08 NOTE — ED Notes (Signed)
Pt restless, anxious, confused, rambling, difficult to redirect. PRN Ativan 1 mg PO given.

## 2018-11-09 ENCOUNTER — Encounter (HOSPITAL_COMMUNITY): Payer: Self-pay | Admitting: Emergency Medicine

## 2018-11-09 DIAGNOSIS — Z9181 History of falling: Secondary | ICD-10-CM | POA: Diagnosis not present

## 2018-11-09 DIAGNOSIS — R296 Repeated falls: Secondary | ICD-10-CM

## 2018-11-09 DIAGNOSIS — N390 Urinary tract infection, site not specified: Secondary | ICD-10-CM | POA: Diagnosis not present

## 2018-11-09 DIAGNOSIS — F0391 Unspecified dementia with behavioral disturbance: Secondary | ICD-10-CM | POA: Diagnosis not present

## 2018-11-09 NOTE — Consult Note (Addendum)
[facility] Psych ED Progress Note  11/09/2018 11:29 AM Desiree Salinas  MRN:  [id number] Subjective:  Unable to assess at this time.  Desiree Salinas is a divorced 68 y.o. female who presents involuntarily via [employer], to [place] from [place] facility unit, for hostile and combative behavior (hitting & spitting). Pt was at [facility] last week after an unwitnessed fall. Pt reportedly falls frequently, with multiple abrasions and contusions.  Pt moved to [place] from [place] recently.  Pt has cognitive impairment, with hx of Depression, Anxiety, ADD, & Dementia/Alcoholic dementia.   Pt presents somnolent, keeping her eyes closed throughout session. Pt attempted to engage intermittently- most answers trailed off, with difficulty understanding & hearing pt. Pt denies current and past SI. She denies HI and intention to harm others "unless they try to hurt me". Pt stated she was in [place].   Pt denies AVH. Chart review indicates some past reported paranoia with pt misinterpreting others' intentions. Pt not responsive to questions regarding Depression.  Pt's supports include her sister, who is her POA & lives in [place]. Pt's local contact is her sitter, Jiles Harold [phone] (HIPPA compliant VM left at this number). Pt has poor insight and judgment. Pt's memory is impaired, first noted in 2017.  Protective factors against suicide include facility and sitter support,no current suicidal ideation,no access to firearms, and no prior attempts.?  During the evaluation: Patient is alert, but not oriented. She is calm and cooperative. She remains somnolent, and not able to communicate. She is mute at this time. She has poor eye contact and her behavior is abnormal. SHe appears to be swatting at something in the room, catch it and lets it go on the side of the bed. SHe is requesting quietly at this time. Her thought process is coherent, as she is seen moving her lips when it is time to answer a  question however she does not respond.   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 ADD (attention deficit disorder)   . Allergy   . Anemia   . Anxiety   . Arthritis   . Bruised rib 2020  . Cataract   . Colon polyps    HYPERPLASTIC POLYP  . COPD (chronic obstructive pulmonary disease) ([place])   . Dementia ([place])   . Depression   . Elevated cholesterol   . Shingles   . STD (sexually transmitted disease)    Condylomata  . Substance abuse ([place])    Stopped drinking one year ago.   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 ABDOMINAL HYSTERECTOMY  1999   TAH  . ABDOMINAL SURGERY  1999   Abdominoplasty  . ACHILLES TENDON SURGERY    . AUGMENTATION MAMMAPLASTY     Implants  . COLONOSCOPY  2006  . POLYPECTOMY    . TONSILLECTOMY AND ADENOIDECTOMY    . TUBAL LIGATION     Family History:  Family History  Problem Relation Age of Onset  . Breast cancer Mother        Age 34's  . Diabetes Maternal Grandmother   . Aneurysm Maternal Grandmother   . Aneurysm Maternal  Aunt   . Aneurysm Maternal Grandfather   . Ankylosing spondylitis Father   . Colon cancer Neg Hx   . Colon polyps Neg Hx   . Esophageal cancer Neg Hx   . Rectal cancer Neg Hx   . Stomach cancer Neg Hx   . Dementia Neg Hx    Family Psychiatric  History: unknown Social History:  Social History   Substance and Sexual Activity  Alcohol Use Not Currently  . Alcohol/week: 0.0 standard drinks   Comment: none since May 2019     Social History   Substance and Sexual Activity  Drug Use Not Currently  . Types: Marijuana    Social History    Socioeconomic History  . Marital status: Divorced    Spouse name: Not on file  . Number of children: 2  . Years of education: College  . Highest education level: Not on file  Occupational History  . Occupation: retired  Scientific laboratory technician  . Financial resource strain: Not on file  . Food insecurity    Worry: Not on file    Inability: Not on file  . Transportation needs    Medical: Not on file    Non-medical: Not on file  Tobacco Use  . Smoking status: Former Smoker    Packs/day: 0.90    Types: Cigarettes  . Smokeless tobacco: Never Used  . Tobacco comment: 10 cigarettes per day as of 04/17/2018. Not smoked since the fall as of 06/11/2018  Substance and Sexual Activity  . Alcohol use: Not Currently    Alcohol/week: 0.0 standard drinks    Comment: none since May 2019  . Drug use: Not Currently    Types: Marijuana  . Sexual activity: Never    Birth control/protection: Post-menopausal, Surgical    Comment: 1st intercourse 68 yo-More than 5 partners  Lifestyle  . Physical activity    Days per week: Not on file    Minutes per session: Not on file  . Stress: Not on file  Relationships  . Social Herbalist on phone: Not on file    Gets together: Not on file    Attends religious service: Not on file    Active member of club or organization: Not on file    Attends meetings of clubs or organizations: Not on file    Relationship status: Not on file  Other Topics Concern  . Not on file  Social History Narrative   Lives: alone, has caregiver there 8:00 AM to 10 PM 7 days per week and 24 hour on call. Daughter also lives there      Sleep: Good  Appetite:  Fair  Current Medications: Current Facility-Administered Medications  Medication Dose Route Frequency Provider Last Rate Last Dose  . amphetamine-dextroamphetamine (ADDERALL XR) 24 hr capsule 15 mg  15 mg Oral Daily Curatolo, Adam, DO   15 mg at 11/08/18 0929  . atorvastatin (LIPITOR) tablet 40 mg  40 mg Oral Daily  Curatolo, Adam, DO   40 mg at 11/09/18 0946  . busPIRone (BUSPAR) tablet 20 mg  20 mg Oral QHS Curatolo, Adam, DO   20 mg at 11/08/18 2108  . cephALEXin (KEFLEX) capsule 500 mg  500 mg Oral Q12H Curatolo, Adam, DO   500 mg at 11/09/18 0946  . clonazePAM (KLONOPIN) tablet 1 mg  1 mg Oral QHS Curatolo, Adam, DO   1 mg at 11/08/18 2108  . escitalopram (LEXAPRO) tablet 20 mg  20 mg Oral Daily Lennice Sites,  DO   20 mg at 11/09/18 0946  . haloperidol (HALDOL) tablet 20 mg  20 mg Oral QHS Curatolo, Adam, DO   20 mg at 11/08/18 2109  . LORazepam (ATIVAN) tablet 1 mg  1 mg Oral Q4H PRN Drenda Freeze, MD   1 mg at 11/08/18 1850  . Melatonin TABS 10 mg  10 mg Oral QHS Curatolo, Adam, DO   10 mg at 11/08/18 2109  . pantoprazole (PROTONIX) EC tablet 40 mg  40 mg Oral Daily Curatolo, Adam, DO   40 mg at 11/09/18 0946  . pindolol (VISKEN) tablet 10 mg  10 mg Oral Daily Curatolo, Adam, DO   10 mg at 11/09/18 0947  . QUEtiapine (SEROQUEL) tablet 100 mg  100 mg Oral QHS Curatolo, Adam, DO   100 mg at 11/08/18 2121  . TDaP (BOOSTRIX) injection 0.5 mL  0.5 mL Intramuscular Once Noralee Space, MD       Current Outpatient Medications  Medication Sig Dispense Refill  . acetaminophen (TYLENOL) 325 MG tablet Take 650 mg by mouth 2 (two) times daily.    Marland Kitchen amphetamine-dextroamphetamine (ADDERALL XR) 15 MG 24 hr capsule Take 15 mg by mouth daily.     Marland Kitchen atorvastatin (LIPITOR) 40 MG tablet TAKE 1 BY MOUTH DAILY (Patient taking differently: Take 40 mg by mouth daily. ) 30 tablet 3  . b complex vitamins capsule Take 1 capsule by mouth daily. 30 capsule 3  . busPIRone (BUSPAR) 10 MG tablet Take 20 mg by mouth at bedtime.     . clonazePAM (KLONOPIN) 1 MG tablet Take 1 tablet (1 mg total) by mouth at bedtime. 30 tablet 4  . escitalopram (LEXAPRO) 20 MG tablet Take 20 mg by mouth daily.    . haloperidol (HALDOL) 10 MG tablet Take 20 mg by mouth at bedtime.    . Melatonin 10 MG CAPS Take 10 mg by mouth at bedtime.    .  pantoprazole (PROTONIX) 40 MG tablet Take 40 mg by mouth daily.     . pindolol (VISKEN) 10 MG tablet Take 10 mg by mouth daily.     . QUEtiapine (SEROQUEL) 100 MG tablet Take 100 mg by mouth at bedtime.    Marland Kitchen QUEtiapine (SEROQUEL) 25 MG tablet Take 2 tablets at bedtime. May also take additional tablet up to 4 times a day q6hr as needed for agitation or behavioral issues. (Patient taking differently: Take 25 mg by mouth every 6 (six) hours as needed (agitation). 8am and 2pm) 180 tablet 5  . vitamin C (ASCORBIC ACID) 500 MG tablet Take 500 mg by mouth daily.    . iron polysaccharides (NIFEREX) 150 MG capsule Take 1 capsule (150 mg total) by mouth daily. (Patient not taking: Reported on 10/30/2018) 30 capsule 3  . predniSONE (DELTASONE) 10 MG tablet TAKE 1-2 TABLETS (10-20 MG TOTAL) BY MOUTH DAILY WITH BREAKFAST. (Patient not taking: Reported on 10/30/2018) 60 tablet 0    Lab Results:  Results for orders placed or performed during the hospital encounter of 11/07/18 (from the past 48 hour(s))  CBC with Differential/Platelet     Status: None   Collection Time: 11/07/18  2:04 PM  Result Value Ref Range   WBC 8.6 4.0 - 10.5 K/uL   RBC 4.00 3.87 - 5.11 MIL/uL   Hemoglobin 12.3 12.0 - 15.0 g/dL   HCT 38.5 36.0 - 46.0 %   MCV 96.3 80.0 - 100.0 fL   MCH 30.8 26.0 - 34.0 pg   MCHC  31.9 30.0 - 36.0 g/dL   RDW 13.6 11.5 - 15.5 %   Platelets 312 150 - 400 K/uL   nRBC 0.0 0.0 - 0.2 %   Neutrophils Relative % 68 %   Neutro Abs 5.9 1.7 - 7.7 K/uL   Lymphocytes Relative 21 %   Lymphs Abs 1.8 0.7 - 4.0 K/uL   Monocytes Relative 7 %   Monocytes Absolute 0.6 0.1 - 1.0 K/uL   Eosinophils Relative 2 %   Eosinophils Absolute 0.2 0.0 - 0.5 K/uL   Basophils Relative 1 %   Basophils Absolute 0.1 0.0 - 0.1 K/uL   Immature Granulocytes 1 %   Abs Immature Granulocytes 0.05 0.00 - 0.07 K/uL    Comment: Performed at [facility], [place] [address] [place], [place] [address]  Comprehensive metabolic  panel     Status: Abnormal   Collection Time: 11/07/18  2:04 PM  Result Value Ref Range   Sodium 142 135 - 145 mmol/L   Potassium 3.8 3.5 - 5.1 mmol/L   Chloride 107 98 - 111 mmol/L   CO2 25 22 - 32 mmol/L   Glucose, Bld 128 (H) 70 - 99 mg/dL   BUN 14 8 - 23 mg/dL   Creatinine, Ser 0.82 0.44 - 1.00 mg/dL   Calcium 8.7 (L) 8.9 - 10.3 mg/dL   Total Protein 7.2 6.5 - 8.1 g/dL   Albumin 4.1 3.5 - 5.0 g/dL   AST 18 15 - 41 U/L   ALT 19 0 - 44 U/L   Alkaline Phosphatase 107 38 - 126 U/L   Total Bilirubin 0.8 0.3 - 1.2 mg/dL   GFR calc non Af Amer >60 >60 mL/min   GFR calc Af Amer >60 >60 mL/min   Anion gap 10 5 - 15    Comment: Performed at [facility], [place] [address] [place], [place] [address]  Rapid urine drug screen (hospital performed)     Status: None   Collection Time: 11/07/18  2:04 PM  Result Value Ref Range   Opiates NONE DETECTED NONE DETECTED   Cocaine NONE DETECTED NONE DETECTED   Benzodiazepines NONE DETECTED NONE DETECTED   Amphetamines NONE DETECTED NONE DETECTED   Tetrahydrocannabinol NONE DETECTED NONE DETECTED   Barbiturates NONE DETECTED NONE DETECTED    Comment: (NOTE) DRUG SCREEN FOR MEDICAL PURPOSES ONLY.  IF CONFIRMATION IS NEEDED FOR ANY PURPOSE, NOTIFY LAB WITHIN 5 DAYS. LOWEST DETECTABLE LIMITS FOR URINE DRUG SCREEN Drug Class                     Cutoff (ng/mL) Amphetamine and metabolites    1000 Barbiturate and metabolites    200 Benzodiazepine                 [id number] Tricyclics and metabolites     300 Opiates and metabolites        300 Cocaine and metabolites        300 THC                            50 Performed at [facility], [place] [address], [place], [place] [address]   Ethanol     Status: None   Collection Time: 11/07/18  2:04 PM  Result Value Ref Range   Alcohol, Ethyl (B) <10 <10 mg/dL    Comment: (NOTE) Lowest detectable limit for serum alcohol is 10 mg/dL. For medical purposes only.  Performed at  [facility], [place] [address] [place], [place] [id number]   Salicylate level     Status: None   Collection Time: 11/07/18  2:04 PM  Result Value Ref Range   Salicylate Lvl [id number] 2.8 - 30.0 mg/dL    Comment: Performed at [facility], [place] [address], [place], [place] [address]  Acetaminophen level     Status: Abnormal   Collection Time: 11/07/18  2:04 PM  Result Value Ref Range   Acetaminophen (Tylenol), Serum <10 (L) 10 - 30 ug/mL    Comment: (NOTE) Therapeutic concentrations vary significantly. A range of 10-30 ug/mL  may be an effective concentration for many patients. However, some  are best treated at concentrations outside of this range. Acetaminophen concentrations >150 ug/mL at 4 hours after ingestion  and >50 ug/mL at 12 hours after ingestion are often associated with  toxic reactions. Performed at [facility], [place] [address], [place], [place] [address]   Urinalysis, Routine w reflex microscopic     Status: Abnormal   Collection Time: 11/07/18  2:05 PM  Result Value Ref Range   Color, Urine YELLOW YELLOW   APPearance HAZY (A) CLEAR   Specific Gravity, Urine 1.005 1.005 - 1.030   pH 6.0 5.0 - 8.0   Glucose, UA NEGATIVE NEGATIVE mg/dL   Hgb urine dipstick NEGATIVE NEGATIVE   Bilirubin Urine NEGATIVE NEGATIVE   Ketones, ur NEGATIVE NEGATIVE mg/dL   Protein, ur NEGATIVE NEGATIVE mg/dL   Nitrite NEGATIVE NEGATIVE   Leukocytes,Ua LARGE (A) NEGATIVE   RBC / HPF 0-5 0 - 5 RBC/hpf   WBC, UA 21-50 0 - 5 WBC/hpf   Bacteria, UA RARE (A) NONE SEEN   Squamous Epithelial / LPF 6-10 0 - 5    Comment: Performed at [facility], [place] [address] [place], [place] [address]    Blood Alcohol level:  Lab Results  Component Value Date   ETH <10 11/07/2018    Physical Findings: AIMS:  , ,  ,  ,    CIWA:    COWS:     Musculoskeletal: Strength & Muscle Tone: within normal limits Gait & Station: unabel to  assess Patient leans: N/A  Psychiatric Specialty Exam: Physical Exam  ROS  Blood pressure 96/63, pulse 78, temperature 98 F (36.7 C), temperature source Oral, resp. rate 18, height 5\' 5"  (1.651 m), weight 59.4 kg, SpO2 94 %.Body mass index is 21.79 kg/m.  General Appearance: Fairly Groomed  Eye Contact:  Fair  Speech:  Clear and Coherent and Normal Rate  Volume:  Normal  Mood:  Euthymic  Affect:  Blunt  Thought Process:  Coherent  Orientation:  Negative  Thought Content:  Negative  Suicidal Thoughts:  No  Homicidal Thoughts:  No  Memory:  NA  Judgement:  Negative  Insight:  Negative  Psychomotor Activity:  Restlessness  Concentration:  Concentration: Fair and Attention Span: Fair  Recall:  [employer] of Knowledge:  Fair  Language:  Fair  Akathisia:  No  Handed:  Right  AIMS (if indicated):     Assets:  Communication Skills Desire for Improvement Financial Resources/Insurance Housing Resilience Social Support  ADL's:  Intact  Cognition:  WNL  Sleep:         Treatment Plan Summary: Daily contact with patient to assess and evaluate symptoms and progress in treatment, Medication management and Plan continue to monitor for improvement. Possible discharge tomorrow. Continue current medications at this time.  Patient has not displayed any disruptive behaviors since being on the unit.   Suella Broad, FNP 11/09/2018, 11:29 AM  Patient seen face-to-face for psychiatric evaluation, chart reviewed and case discussed with the physician extender and developed treatment plan. Reviewed the information documented and agree with the treatment plan. Corena Pilgrim, MD

## 2018-11-10 DIAGNOSIS — R5381 Other malaise: Secondary | ICD-10-CM | POA: Diagnosis not present

## 2018-11-10 DIAGNOSIS — F0391 Unspecified dementia with behavioral disturbance: Secondary | ICD-10-CM

## 2018-11-10 DIAGNOSIS — N390 Urinary tract infection, site not specified: Secondary | ICD-10-CM | POA: Diagnosis not present

## 2018-11-10 DIAGNOSIS — Z7401 Bed confinement status: Secondary | ICD-10-CM | POA: Diagnosis not present

## 2018-11-10 DIAGNOSIS — M2550 Pain in unspecified joint: Secondary | ICD-10-CM | POA: Diagnosis not present

## 2018-11-10 LAB — URINE CULTURE

## 2018-11-10 MED ORDER — QUETIAPINE FUMARATE 25 MG PO TABS: 25.0000 mg | ORAL_TABLET | Freq: Two times a day (BID) | ORAL | Status: DC | PRN

## 2018-11-10 MED ORDER — HALOPERIDOL 10 MG PO TABS: 20.0000 mg | ORAL_TABLET | Freq: Every day | ORAL | 0 refills | Status: DC

## 2018-11-10 NOTE — NC FL2 (Signed)
[place] LEVEL OF CARE SCREENING TOOL     IDENTIFICATION  Patient Name: Desiree Salinas Birthdate: [birth year] Sex: female Admission Date (Current Location): 11/07/2018  [facility] and [place] Number:  Herbalist and Address:  [facility],  [place] [address] [place]      Provider Number: [id number]  Attending Physician Name and Address:  Default, Provider, MD  Relative Name and Phone Number:  [name]) Sister [phone]    Current Level of Care: Hospital Recommended Level of Care: Memory Care Prior Approval Number:    Date Approved/Denied:   PASRR Number:    Discharge Plan: Other (Comment)(Memory Care)    Current Diagnoses: Patient Active Problem List   Diagnosis Date Noted  . 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 Iron deficiency anemia 07/15/2018  . Fall 06/09/2018  . 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 Degenerative scoliosis 02/10/2018  . Diarrhea 10/14/2013  . Cigarette smoker 08/08/2011  . VAIN I (vaginal intraepithelial neoplasia grade I)   . COLONIC POLYPS 12/21/2007  . ALLERGIC RHINITIS 12/21/2007  . 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 HYPERCHOLESTEROLEMIA 07/05/2007  . 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 DERMATITIS 07/05/2007  . SHINGLES, HX OF 07/05/2007    Orientation RESPIRATION BLADDER Height & Weight     Self, Place(fluctuates due to dementia)  Normal Continent Weight: 130 lb 15.3 oz (59.4 kg) Height:  5\' 5"  (165.1 cm)  BEHAVIORAL SYMPTOMS/MOOD NEUROLOGICAL BOWEL NUTRITION STATUS      Continent Diet(regular)  AMBULATORY STATUS COMMUNICATION OF NEEDS Skin   Limited Assist Verbally Normal                       Personal Care Assistance Level of Assistance  Bathing, Feeding, Dressing Bathing Assistance: Limited assistance Feeding assistance: Independent Dressing Assistance: Limited assistance     Functional Limitations Info  Sight, Hearing, Speech  Sight Info: Adequate Hearing Info: Adequate Speech Info: Adequate    SPECIAL CARE FACTORS FREQUENCY                       Contractures Contractures Info: Not present    Additional Factors Info  Code Status, Allergies, Psychotropic Code Status Info: Full Allergies Info: Eucerin (Basis Facial Moisturizer); Latex; Nickel; Cobalt Psychotropic Info: see med list         Current Medications (11/10/2018):  This is the current hospital active medication list Current Facility-Administered Medications  Medication Dose Route Frequency Provider Last Rate Last Dose  . amphetamine-dextroamphetamine (ADDERALL XR) 24 hr capsule 15 mg  15 mg Oral Daily Curatolo, Adam, DO   15 mg at 11/10/18 0912  . atorvastatin (LIPITOR) tablet 40 mg  40 mg Oral Daily Curatolo, Adam, DO   40 mg at 11/10/18 0912  . busPIRone (BUSPAR) tablet 20 mg  20 mg Oral QHS Curatolo, Adam, DO   20 mg at 11/09/18 2112  . clonazePAM (KLONOPIN) tablet 1 mg  1 mg Oral QHS Curatolo, Adam, DO   1 mg at 11/09/18 2112  . escitalopram (LEXAPRO) tablet 20 mg  20 mg Oral Daily Curatolo, Adam, DO   20 mg at 11/10/18 0912  . haloperidol (HALDOL) tablet 20 mg  20 mg Oral QHS Curatolo, Adam, DO   20 mg at 11/09/18 2112  . LORazepam (ATIVAN) tablet 1 mg  1 mg Oral Q4H PRN Drenda Freeze, MD   1 mg at 11/08/18 1850  . Melatonin TABS 10  mg  10 mg Oral QHS Curatolo, Adam, DO   10 mg at 11/09/18 2112  . pantoprazole (PROTONIX) EC tablet 40 mg  40 mg Oral Daily Curatolo, Adam, DO   40 mg at 11/10/18 0912  . pindolol (VISKEN) tablet 10 mg  10 mg Oral Daily Curatolo, Adam, DO   10 mg at 11/10/18 0912  . QUEtiapine (SEROQUEL) tablet 100 mg  100 mg Oral QHS Curatolo, Adam, DO   100 mg at 11/09/18 2112  . QUEtiapine (SEROQUEL) tablet 25 mg  25 mg Oral BID PRN Emmaline Kluver, FNP      . TDaP (BOOSTRIX) injection 0.5 mL  0.5 mL Intramuscular Once Noralee Space, MD       Current Outpatient Medications  Medication Sig Dispense Refill  .  acetaminophen (TYLENOL) 325 MG tablet Take 650 mg by mouth 2 (two) times daily.    Marland Kitchen amphetamine-dextroamphetamine (ADDERALL XR) 15 MG 24 hr capsule Take 15 mg by mouth daily.     Marland Kitchen atorvastatin (LIPITOR) 40 MG tablet TAKE 1 BY MOUTH DAILY (Patient taking differently: Take 40 mg by mouth daily. ) 30 tablet 3  . b complex vitamins capsule Take 1 capsule by mouth daily. 30 capsule 3  . busPIRone (BUSPAR) 10 MG tablet Take 20 mg by mouth at bedtime.     . clonazePAM (KLONOPIN) 1 MG tablet Take 1 tablet (1 mg total) by mouth at bedtime. 30 tablet 4  . escitalopram (LEXAPRO) 20 MG tablet Take 20 mg by mouth daily.    . haloperidol (HALDOL) 10 MG tablet Take 20 mg by mouth at bedtime.    . Melatonin 10 MG CAPS Take 10 mg by mouth at bedtime.    . pantoprazole (PROTONIX) 40 MG tablet Take 40 mg by mouth daily.     . pindolol (VISKEN) 10 MG tablet Take 10 mg by mouth daily.     . QUEtiapine (SEROQUEL) 100 MG tablet Take 100 mg by mouth at bedtime.    Marland Kitchen QUEtiapine (SEROQUEL) 25 MG tablet Take 2 tablets at bedtime. May also take additional tablet up to 4 times a day q6hr as needed for agitation or behavioral issues. (Patient taking differently: Take 25 mg by mouth every 6 (six) hours as needed (agitation). 8am and 2pm) 180 tablet 5  . vitamin C (ASCORBIC ACID) 500 MG tablet Take 500 mg by mouth daily.    . iron polysaccharides (NIFEREX) 150 MG capsule Take 1 capsule (150 mg total) by mouth daily. (Patient not taking: Reported on 10/30/2018) 30 capsule 3  . predniSONE (DELTASONE) 10 MG tablet TAKE 1-2 TABLETS (10-20 MG TOTAL) BY MOUTH DAILY WITH BREAKFAST. (Patient not taking: Reported on 10/30/2018) 60 tablet 0     Discharge Medications: Please see discharge summary for a list of discharge medications.  Relevant Imaging Results:  Relevant Lab Results:   Additional Information SS# [id number]  Janace Hoard, LCSW

## 2018-11-10 NOTE — BH Assessment (Addendum)
[facility] Assessment Progress Note  Per Buford Dresser, DO, this pt does not require psychiatric hospitalization at this time.  Pt presents under IVC initiated by pt's health care provider, and upheld by Corena Pilgrim, MD, which is to be rescinded by [facility] Dey-Johnson, LCSW.  Pt is to be discharged from [facility].  No behavioral health resources are indicated for this pt.  A social work consult has been ordered to further address pt's psychosocial needs.  Pt's nurse, Tilda Franco, has been notified.  Jalene Mullet, MA Triage Specialist [phone]   Addendum:  IVC has been rescinded.  Pt's nurse has been notified.  Jalene Mullet, [place] Coordinator [phone]

## 2018-11-10 NOTE — Progress Notes (Signed)
11/10/2018  1520  Called patient sister to inform her that Corey Harold was here to pick up her sister to transport her to [place].

## 2018-11-10 NOTE — Progress Notes (Addendum)
2:15p Patient will be returning to [facility] [place]. CSW set up transportation via [facility]. CSW provided RN with number for report and patient's room number (is [phone] ask to speak to [facility] and patient will be going to room #H114). RN to contact patient's POA when patient is being picked up so patient's POA can have patient's sitter arrive at the facility when she gets there.   12:46p CSW received call from admissions director Burna Mortimer DeVaughn who asked about patient's care while here in the hospital. CSW went over patient's current medications and per Burna Mortimer, some of the medications patient did not have on her MAR with [name] (Haldol and  Seroquel regimen was different 8a and 2p). Burna Mortimer reports while trying to admitted patient from [facility] she received 4 different FL2 with changes in the medication regimen for Seroquel and took out that patient has been and danger to herself and others. Burna Mortimer reports because patient has been out of their facility for over 72 hours, patient will need to be evaluated by her and will need another FL2. Desiree Salinas reports that she is off today and will not be able to get out here until Wednesday. CSW explained that patient can not be held in the ED and will have to come back today.   Desiree Salinas called CSW back and requested if her can add patient's Seroquel 25mg  at 8a and 2p to help with agitation. CSW spoke with Dr. Mariea Clonts about this who was agreeable to add it. CSW to complete a new FL2 to fax to the facility [phone] (fax). Burna Mortimer agreed to call patient's family to ensure patient's sitter will return.   9:40a CSW received call from patient's sister, Desiree Salinas, requesting an update on patient. CSW explained that patient is still waiting to be re-evaluated with TTS for disposition. At this time, patient is still under IVC. Patient sister reports she is hopeful that patient will be able to return to her ALF, [name]. CSW will continue to follow up.   Golden Circle, LCSW Transitions of Care Department [facility] ED [phone]

## 2018-11-10 NOTE — Progress Notes (Signed)
11/10/2018  1507  Called Report to [facility] [phone]. Report given to [facility] CSW called [facility].

## 2018-11-10 NOTE — Consult Note (Addendum)
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  History of dementia, cognitively impaired.  Patient unable to answer questions regarding suicidal or homicidal, unable to answer AVH. Patient endorses "Yes, I slept ok."  Total Time spent with patient: 15 minutes  Past Psychiatric History: MDD  Past Medical History:  Past Medical History:  Diagnosis Date  . ADD (attention deficit disorder)   . Allergy   . Anemia   . Anxiety   . Arthritis   . Bruised rib 2020  . Cataract   . Colon polyps    HYPERPLASTIC POLYP  . COPD (chronic obstructive pulmonary disease) ([place])   . Dementia ([place])   . Depression   . Elevated cholesterol   . Shingles   . STD (sexually transmitted disease)    Condylomata  . Substance abuse ([place])    Stopped drinking one year ago.   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 ABDOMINAL HYSTERECTOMY  1999   TAH  . ABDOMINAL SURGERY  1999   Abdominoplasty  . ACHILLES TENDON SURGERY    . AUGMENTATION MAMMAPLASTY     Implants  . COLONOSCOPY  2006  . POLYPECTOMY    . TONSILLECTOMY AND ADENOIDECTOMY    . TUBAL LIGATION     Family History:  Family History  Problem Relation Age of Onset  . Breast cancer Mother        Age 87's  . Diabetes Maternal Grandmother   . Aneurysm Maternal Grandmother   . Aneurysm Maternal Aunt   . Aneurysm Maternal Grandfather   . Ankylosing spondylitis Father   . Colon cancer Neg Hx   . Colon polyps Neg Hx   .  Esophageal cancer Neg Hx   . Rectal cancer Neg Hx   . Stomach cancer Neg Hx   . Dementia Neg Hx    Family Psychiatric  History: None per chart review.  Social History:  Social History   Substance and Sexual Activity  Alcohol Use Not Currently  . Alcohol/week: 0.0 standard drinks   Comment: none since May 2019     Social History   Substance and Sexual Activity  Drug Use Not Currently  . Types: Marijuana    Social History   Socioeconomic History  . Marital status: Divorced    Spouse name: Not on file  . Number of children: 2  . Years of education: College  . Highest education level: Not on file  Occupational History  . Occupation: retired  Scientific laboratory technician  . Financial resource strain: Not on file  . Food insecurity    Worry: Not on file    Inability: Not on file  . Transportation needs    Medical: Not on file    Non-medical: Not on file  Tobacco Use  . Smoking status: Former Smoker    Packs/day: 0.90    Types: Cigarettes  . Smokeless tobacco: Never Used  . Tobacco comment: 10 cigarettes per day as of 04/17/2018. Not smoked since the fall as of 06/11/2018  Substance and  Sexual Activity  . Alcohol use: Not Currently    Alcohol/week: 0.0 standard drinks    Comment: none since May 2019  . Drug use: Not Currently    Types: Marijuana  . Sexual activity: Never    Birth control/protection: Post-menopausal, Surgical    Comment: 1st intercourse 68 yo-More than 5 partners  Lifestyle  . Physical activity    Days per week: Not on file    Minutes per session: Not on file  . Stress: Not on file  Relationships  . Social Herbalist on phone: Not on file    Gets together: Not on file    Attends religious service: Not on file    Active member of club or organization: Not on file    Attends meetings of clubs or organizations: Not on file    Relationship status: Not on file  Other Topics Concern  . Not on file  Social History Narrative   Lives: alone, has caregiver  there 8:00 AM to 10 PM 7 days per week and 24 hour on call. Daughter also lives there      Has this patient used any form of tobacco in the last 30 days? (Cigarettes, Smokeless Tobacco, Cigars, and/or Pipes) Prescription not provided because: Patient does not use tobacco products.  Current Medications: Current Facility-Administered Medications  Medication Dose Route Frequency Provider Last Rate Last Dose  . amphetamine-dextroamphetamine (ADDERALL XR) 24 hr capsule 15 mg  15 mg Oral Daily Curatolo, Adam, DO   15 mg at 11/10/18 0912  . atorvastatin (LIPITOR) tablet 40 mg  40 mg Oral Daily Curatolo, Adam, DO   40 mg at 11/10/18 0912  . busPIRone (BUSPAR) tablet 20 mg  20 mg Oral QHS Curatolo, Adam, DO   20 mg at 11/09/18 2112  . clonazePAM (KLONOPIN) tablet 1 mg  1 mg Oral QHS Curatolo, Adam, DO   1 mg at 11/09/18 2112  . escitalopram (LEXAPRO) tablet 20 mg  20 mg Oral Daily Curatolo, Adam, DO   20 mg at 11/10/18 0912  . haloperidol (HALDOL) tablet 20 mg  20 mg Oral QHS Curatolo, Adam, DO   20 mg at 11/09/18 2112  . LORazepam (ATIVAN) tablet 1 mg  1 mg Oral Q4H PRN Drenda Freeze, MD   1 mg at 11/08/18 1850  . Melatonin TABS 10 mg  10 mg Oral QHS Curatolo, Adam, DO   10 mg at 11/09/18 2112  . pantoprazole (PROTONIX) EC tablet 40 mg  40 mg Oral Daily Curatolo, Adam, DO   40 mg at 11/10/18 0912  . pindolol (VISKEN) tablet 10 mg  10 mg Oral Daily Curatolo, Adam, DO   10 mg at 11/10/18 0912  . QUEtiapine (SEROQUEL) tablet 100 mg  100 mg Oral QHS Curatolo, Adam, DO   100 mg at 11/09/18 2112  . TDaP (BOOSTRIX) injection 0.5 mL  0.5 mL Intramuscular Once Noralee Space, MD       Current Outpatient Medications  Medication Sig Dispense Refill  . acetaminophen (TYLENOL) 325 MG tablet Take 650 mg by mouth 2 (two) times daily.    Marland Kitchen amphetamine-dextroamphetamine (ADDERALL XR) 15 MG 24 hr capsule Take 15 mg by mouth daily.     Marland Kitchen atorvastatin (LIPITOR) 40 MG tablet TAKE 1 BY MOUTH DAILY (Patient taking  differently: Take 40 mg by mouth daily. ) 30 tablet 3  . b complex vitamins capsule Take 1 capsule by mouth daily. 30 capsule 3  . busPIRone (BUSPAR) 10  MG tablet Take 20 mg by mouth at bedtime.     . clonazePAM (KLONOPIN) 1 MG tablet Take 1 tablet (1 mg total) by mouth at bedtime. 30 tablet 4  . escitalopram (LEXAPRO) 20 MG tablet Take 20 mg by mouth daily.    . haloperidol (HALDOL) 10 MG tablet Take 20 mg by mouth at bedtime.    . Melatonin 10 MG CAPS Take 10 mg by mouth at bedtime.    . pantoprazole (PROTONIX) 40 MG tablet Take 40 mg by mouth daily.     . pindolol (VISKEN) 10 MG tablet Take 10 mg by mouth daily.     . QUEtiapine (SEROQUEL) 100 MG tablet Take 100 mg by mouth at bedtime.    Marland Kitchen QUEtiapine (SEROQUEL) 25 MG tablet Take 2 tablets at bedtime. May also take additional tablet up to 4 times a day q6hr as needed for agitation or behavioral issues. (Patient taking differently: Take 25 mg by mouth every 6 (six) hours as needed (agitation). 8am and 2pm) 180 tablet 5  . vitamin C (ASCORBIC ACID) 500 MG tablet Take 500 mg by mouth daily.    . iron polysaccharides (NIFEREX) 150 MG capsule Take 1 capsule (150 mg total) by mouth daily. (Patient not taking: Reported on 10/30/2018) 30 capsule 3  . predniSONE (DELTASONE) 10 MG tablet TAKE 1-2 TABLETS (10-20 MG TOTAL) BY MOUTH DAILY WITH BREAKFAST. (Patient not taking: Reported on 10/30/2018) 60 tablet 0   PTA Medications: (Not in a hospital admission)   Musculoskeletal: Strength & Muscle Tone: Atrophy associated with aging. Gait & Station: normal Patient leans: N/A  Psychiatric Specialty Exam: Physical Exam  Nursing note and vitals reviewed. Constitutional: She appears well-developed.  HENT:  Head: Normocephalic and atraumatic.  Neck: Normal range of motion.  Cardiovascular: Normal rate.  Respiratory: Effort normal.  Neurological: She is alert.  Oriented to person only.   Psychiatric: She has a normal mood and affect. Her behavior is  normal. Thought content normal. Her speech is delayed. Cognition and memory are impaired. She expresses impulsivity. She exhibits abnormal recent memory and abnormal remote memory.    Review of Systems  Constitutional: Negative.   HENT: Negative.   Eyes: Negative.   Respiratory: Negative.   Cardiovascular: Negative.   Gastrointestinal: Negative.   Genitourinary: Negative.   Musculoskeletal: Negative.   Skin: Negative.   Neurological: Negative.   Endo/Heme/Allergies: Negative.   Psychiatric/Behavioral: Positive for memory loss.  All other systems reviewed and are negative.   Blood pressure (!) 94/52, pulse 72, temperature 99 F (37.2 C), temperature source Oral, resp. rate 16, height 5\' 5"  (1.651 m), weight 59.4 kg, SpO2 96 %.Body mass index is 21.79 kg/m.  General Appearance: Casual  Eye Contact:  Fair  Speech:  Slow  Volume:  Normal  Mood:  Euthymic  Affect:  Appropriate  Thought Process:  Disorganized  Orientation:  Negative  Thought Content:  Logical  Suicidal Thoughts:  Patient unable to answer  Homicidal Thoughts:  patient unable to answer  Memory:  Immediate;   Poor Recent;   Poor Remote;   Poor  Judgement:  Impaired  Insight:  Lacking  Psychomotor Activity:  Normal  Concentration:  Concentration: Poor and Attention Span: Poor  Recall:  Poor  Fund of Knowledge:  Fair  Language:  Fair  Akathisia:  No  Handed:  Right  AIMS (if indicated):   N/A  Assets:  Housing Social Support  ADL's:  Impaired  Cognition:  Impaired,  Severe  Sleep:  N/A     Demographic Factors:  Age 68 or older and Divorced or widowed  Loss Factors: Decline in physical health  Historical Factors: NA  Risk Reduction Factors:   Living with another person, especially a relative, Positive social support and Positive therapeutic relationship     Cognitive Features That Contribute To Risk:  Loss of executive function    Suicide Risk:  Minimal: No identifiable suicidal ideation.   Patients presenting with no risk factors but with morbid ruminations; may be classified as minimal risk based on the severity of the depressive symptoms   Disposition: Plan Of Care/Follow-up recommendations:  Other:  Patient psychiatrically cleared. Plan to return to facility.   Emmaline Kluver, [place] 11/10/2018, 12:22 PM   Patient seen by telemedicine for psychiatric evaluation, chart reviewed and case discussed with the physician extender and developed treatment plan. Reviewed the information documented and agree with the treatment plan.  Buford Dresser, DO 11/10/18 5:07 PM

## 2018-11-13 ENCOUNTER — Encounter (HOSPITAL_COMMUNITY): Payer: Self-pay | Admitting: Emergency Medicine

## 2018-11-13 ENCOUNTER — Emergency Department (HOSPITAL_COMMUNITY): Payer: Medicare Other

## 2018-11-13 ENCOUNTER — Emergency Department (HOSPITAL_COMMUNITY)
Admission: EM | Admit: 2018-11-13 | Discharge: 2018-11-21 | Disposition: A | Discharge status: Home / Self Care | Payer: Medicare Other | Attending: Emergency Medicine | Admitting: Emergency Medicine

## 2018-11-13 ENCOUNTER — Other Ambulatory Visit: Payer: Self-pay

## 2018-11-13 DIAGNOSIS — R32 Unspecified urinary incontinence: Secondary | ICD-10-CM | POA: Diagnosis not present

## 2018-11-13 DIAGNOSIS — R262 Difficulty in walking, not elsewhere classified: Secondary | ICD-10-CM

## 2018-11-13 DIAGNOSIS — R630 Anorexia: Secondary | ICD-10-CM | POA: Diagnosis not present

## 2018-11-13 DIAGNOSIS — Z20828 Contact with and (suspected) exposure to other viral communicable diseases: Secondary | ICD-10-CM | POA: Insufficient documentation

## 2018-11-13 DIAGNOSIS — F0391 Unspecified dementia with behavioral disturbance: Secondary | ICD-10-CM | POA: Diagnosis not present

## 2018-11-13 DIAGNOSIS — R4182 Altered mental status, unspecified: Secondary | ICD-10-CM | POA: Diagnosis present

## 2018-11-13 DIAGNOSIS — R4020 Unspecified coma: Secondary | ICD-10-CM | POA: Diagnosis not present

## 2018-11-13 DIAGNOSIS — W19XXXA Unspecified fall, initial encounter: Secondary | ICD-10-CM | POA: Diagnosis not present

## 2018-11-13 DIAGNOSIS — G9389 Other specified disorders of brain: Secondary | ICD-10-CM | POA: Diagnosis not present

## 2018-11-13 DIAGNOSIS — R404 Transient alteration of awareness: Secondary | ICD-10-CM | POA: Diagnosis not present

## 2018-11-13 DIAGNOSIS — W19XXXD Unspecified fall, subsequent encounter: Secondary | ICD-10-CM | POA: Insufficient documentation

## 2018-11-13 DIAGNOSIS — F1027 Alcohol dependence with alcohol-induced persisting dementia: Secondary | ICD-10-CM | POA: Diagnosis not present

## 2018-11-13 DIAGNOSIS — J449 Chronic obstructive pulmonary disease, unspecified: Secondary | ICD-10-CM | POA: Diagnosis not present

## 2018-11-13 DIAGNOSIS — M5030 Other cervical disc degeneration, unspecified cervical region: Secondary | ICD-10-CM | POA: Diagnosis not present

## 2018-11-13 DIAGNOSIS — Z91040 Latex allergy status: Secondary | ICD-10-CM | POA: Insufficient documentation

## 2018-11-13 DIAGNOSIS — Z79899 Other long term (current) drug therapy: Secondary | ICD-10-CM | POA: Insufficient documentation

## 2018-11-13 DIAGNOSIS — R358 Other polyuria: Secondary | ICD-10-CM | POA: Insufficient documentation

## 2018-11-13 DIAGNOSIS — M4696 Unspecified inflammatory spondylopathy, lumbar region: Secondary | ICD-10-CM | POA: Diagnosis not present

## 2018-11-13 DIAGNOSIS — M160 Bilateral primary osteoarthritis of hip: Secondary | ICD-10-CM | POA: Diagnosis not present

## 2018-11-13 DIAGNOSIS — Z87891 Personal history of nicotine dependence: Secondary | ICD-10-CM | POA: Diagnosis not present

## 2018-11-13 DIAGNOSIS — J8410 Pulmonary fibrosis, unspecified: Secondary | ICD-10-CM | POA: Diagnosis not present

## 2018-11-13 DIAGNOSIS — R5381 Other malaise: Secondary | ICD-10-CM | POA: Diagnosis not present

## 2018-11-13 DIAGNOSIS — F99 Mental disorder, not otherwise specified: Secondary | ICD-10-CM | POA: Diagnosis not present

## 2018-11-13 LAB — CBC WITH DIFFERENTIAL/PLATELET
Abs Immature Granulocytes: 0.05 10*3/uL (ref 0.00–0.07)
Basophils Absolute: 0.1 10*3/uL (ref 0.0–0.1)
Basophils Relative: 1 %
Eosinophils Absolute: 0.3 10*3/uL (ref 0.0–0.5)
Eosinophils Relative: 3 %
HCT: 42.2 % (ref 36.0–46.0)
Hemoglobin: 13.8 g/dL (ref 12.0–15.0)
Immature Granulocytes: 1 %
Lymphocytes Relative: 17 %
Lymphs Abs: 1.8 10*3/uL (ref 0.7–4.0)
MCH: 30.7 pg (ref 26.0–34.0)
MCHC: 32.7 g/dL (ref 30.0–36.0)
MCV: 93.8 fL (ref 80.0–100.0)
Monocytes Absolute: 1 10*3/uL (ref 0.1–1.0)
Monocytes Relative: 9 %
Neutro Abs: 7.3 10*3/uL (ref 1.7–7.7)
Neutrophils Relative %: 69 %
Platelets: 328 10*3/uL (ref 150–400)
RBC: 4.5 MIL/uL (ref 3.87–5.11)
RDW: 13.7 % (ref 11.5–15.5)
WBC: 10.5 10*3/uL (ref 4.0–10.5)
nRBC: 0 % (ref 0.0–0.2)

## 2018-11-13 LAB — URINALYSIS, ROUTINE W REFLEX MICROSCOPIC
Bilirubin Urine: NEGATIVE
Glucose, UA: NEGATIVE mg/dL
Hgb urine dipstick: NEGATIVE
Ketones, ur: 5 mg/dL — AB
Leukocytes,Ua: NEGATIVE
Nitrite: NEGATIVE
Protein, ur: NEGATIVE mg/dL
Specific Gravity, Urine: 1.024 (ref 1.005–1.030)
pH: 5 (ref 5.0–8.0)

## 2018-11-13 LAB — COMPREHENSIVE METABOLIC PANEL
ALT: 15 U/L (ref 0–44)
AST: 16 U/L (ref 15–41)
Albumin: 3.5 g/dL (ref 3.5–5.0)
Alkaline Phosphatase: 109 U/L (ref 38–126)
Anion gap: 11 (ref 5–15)
BUN: 18 mg/dL (ref 8–23)
CO2: 22 mmol/L (ref 22–32)
Calcium: 8.7 mg/dL — ABNORMAL LOW (ref 8.9–10.3)
Chloride: 109 mmol/L (ref 98–111)
Creatinine, Ser: 0.81 mg/dL (ref 0.44–1.00)
GFR calc Af Amer: >60 mL/min (ref >60)
GFR calc non Af Amer: >60 mL/min (ref >60)
Glucose, Bld: 107 mg/dL — ABNORMAL HIGH (ref 70–99)
Potassium: 4.3 mmol/L (ref 3.5–5.1)
Sodium: 142 mmol/L (ref 135–145)
Total Bilirubin: 0.7 mg/dL (ref 0.3–1.2)
Total Protein: 6.3 g/dL — ABNORMAL LOW (ref 6.5–8.1)

## 2018-11-13 LAB — LIPASE, BLOOD: Lipase: 21 U/L (ref 11–51)

## 2018-11-13 LAB — AMMONIA: Ammonia: 15 umol/L (ref 9–35)

## 2018-11-13 NOTE — ED Provider Notes (Signed)
[facility] EMERGENCY DEPARTMENT Provider Note   CSN: [id number] Arrival date & time: 11/13/18  1352     History   Chief Complaint Chief Complaint  Patient presents with  . Altered Mental Status    HPI Desiree Salinas is a 68 y.o. female with past medical history of alcoholic dementia, COPD, anemia, anxiety, who presents today for failure to thrive.  History is primarily obtained from Dr. Conley Canal at [facility] [place] where patient lives.  She states that since patient had a fall approximately 2 weeks ago she has had a very rapid decline.  She was in the emergency room from 9/4 until 9/7 after being IVC had by nursing home staff after she had become agitated and attempted to strike staff members.  They report that recently patient has been urinating a lot however has had very poor p.o. intake.    Dr. Elisabeth Cara states that she spoke with patient's sister, who expressed concerns that this may be related to her fall. Chart review shows that she did have a CT head and neck at that time.     HPI  Past Medical History:  Diagnosis Date  . ADD (attention deficit disorder)   . Allergy   . Anemia   . Anxiety   . Arthritis   . Bruised rib 2020  . Cataract   . Colon polyps    HYPERPLASTIC POLYP  . COPD (chronic obstructive pulmonary disease) ([place])   . Dementia ([place])   . Depression   . Elevated cholesterol   . Shingles   . STD (sexually transmitted disease)    Condylomata  . Substance abuse ([place])    Stopped drinking one year ago.   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 Dementia with aggressive behavior ([place]) 11/08/2018  . Iron deficiency anemia 07/15/2018  . Fall 06/09/2018  . 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 Degenerative scoliosis 02/10/2018  . Diarrhea 10/14/2013  . Cigarette smoker 08/08/2011  . VAIN I (vaginal intraepithelial neoplasia grade  I)   . COLONIC POLYPS 12/21/2007  . ALLERGIC RHINITIS 12/21/2007  . 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 HYPERCHOLESTEROLEMIA 07/05/2007  . 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 DERMATITIS 07/05/2007  . SHINGLES, HX OF 07/05/2007    Past Surgical History:  Procedure Laterality Date  . ABDOMINAL HYSTERECTOMY  1999   TAH  . ABDOMINAL SURGERY  1999   Abdominoplasty  . ACHILLES TENDON SURGERY    . AUGMENTATION MAMMAPLASTY     Implants  . COLONOSCOPY  2006  . POLYPECTOMY    . TONSILLECTOMY AND ADENOIDECTOMY    . TUBAL LIGATION       OB History    Gravida  4   Para  2   Term  2   Preterm      AB  2   Living  2     SAB      TAB      Ectopic      Multiple      Live Births               Home Medications    Prior to Admission medications   Medication Sig Start Date End Date Taking? Authorizing Provider  acetaminophen (TYLENOL) 325 MG tablet Take 650 mg by mouth 2 (two) times daily.    [provider]  amphetamine-dextroamphetamine (ADDERALL XR)  15 MG 24 hr capsule Take 15 mg by mouth daily.  06/19/18   [provider]  atorvastatin (LIPITOR) 40 MG tablet TAKE 1 BY MOUTH DAILY Patient taking differently: Take 40 mg by mouth daily.  06/23/14   Noralee Space, MD  b complex vitamins capsule Take 1 capsule by mouth daily. 08/20/18   Brunetta Genera, MD  busPIRone (BUSPAR) 10 MG tablet Take 20 mg by mouth at bedtime.     [provider]  clonazePAM (KLONOPIN) 1 MG tablet Take 1 tablet (1 mg total) by mouth at bedtime. 09/30/18   Melvenia Beam, MD  escitalopram (LEXAPRO) 20 MG tablet Take 20 mg by mouth daily.    [provider]  haloperidol (HALDOL) 10 MG tablet Take 2 tablets (20 mg total) by mouth at bedtime. 11/10/18   Carmin Muskrat, MD  iron polysaccharides (NIFEREX) 150 MG capsule Take 1 capsule (150 mg total) by mouth daily. Patient not taking: Reported on 10/30/2018 08/20/18   Brunetta Genera, MD   Melatonin 10 MG CAPS Take 10 mg by mouth at bedtime.    [provider]  pantoprazole (PROTONIX) 40 MG tablet Take 40 mg by mouth daily.  06/12/18   [provider]  pindolol (VISKEN) 10 MG tablet Take 10 mg by mouth daily.     [provider]  predniSONE (DELTASONE) 10 MG tablet TAKE 1-2 TABLETS (10-20 MG TOTAL) BY MOUTH DAILY WITH BREAKFAST. Patient not taking: Reported on 10/30/2018 08/18/18   Newt Minion, MD  QUEtiapine (SEROQUEL) 100 MG tablet Take 100 mg by mouth at bedtime. 10/29/18   [provider]  QUEtiapine (SEROQUEL) 25 MG tablet Take 2 tablets at bedtime. May also take additional tablet up to 4 times a day q6hr as needed for agitation or behavioral issues. Patient taking differently: Take 25 mg by mouth every 6 (six) hours as needed (agitation). 8am and 2pm 09/30/18   Melvenia Beam, MD  vitamin C (ASCORBIC ACID) 500 MG tablet Take 500 mg by mouth daily.    [provider]    Family History Family History  Problem Relation Age of Onset  . Breast cancer Mother        Age 33's  . Diabetes Maternal Grandmother   . Aneurysm Maternal Grandmother   . Aneurysm Maternal Aunt   . Aneurysm Maternal Grandfather   . Ankylosing spondylitis Father   . Colon cancer Neg Hx   . Colon polyps Neg Hx   . Esophageal cancer Neg Hx   . Rectal cancer Neg Hx   . Stomach cancer Neg Hx   . Dementia Neg Hx     Social History Social History   Tobacco Use  . Smoking status: Former Smoker    Packs/day: 0.90    Types: Cigarettes  . Smokeless tobacco: Never Used  . Tobacco comment: 10 cigarettes per day as of 04/17/2018. Not smoked since the fall as of 06/11/2018  Substance Use Topics  . Alcohol use: Not Currently    Alcohol/week: 0.0 standard drinks    Comment: none since May 2019  . Drug use: Not Currently    Types: Marijuana     Allergies   Eucerin [basis facial moisturizer], Latex, Nickel, and Cobalt   Review of Systems Review of Systems   Unable to perform ROS: Dementia     Physical Exam Updated Vital Signs BP 100/71   Pulse 71   Temp 98.2 F (36.8 C) (Oral)   Resp 19  Ht 5\' 5"  (1.651 m)   Wt 59.4 kg   SpO2 97%   BMI 21.79 kg/m   Physical Exam Vitals signs and nursing note reviewed.  Constitutional:      General: She is not in acute distress.    Appearance: She is well-developed. She is not diaphoretic.     Comments: Chronically ill-appearing  HENT:     Head: Normocephalic and atraumatic.  Eyes:     General: No scleral icterus.       Right eye: No discharge.        Left eye: No discharge.     Conjunctiva/sclera: Conjunctivae normal.  Neck:     Musculoskeletal: Normal range of motion.  Cardiovascular:     Rate and Rhythm: Normal rate and regular rhythm.     Pulses: Normal pulses.     Heart sounds: Normal heart sounds.  Pulmonary:     Effort: Pulmonary effort is normal. No respiratory distress.     Breath sounds: Normal breath sounds. No stridor.  Abdominal:     General: Abdomen is flat. Bowel sounds are normal. There is no distension.     Tenderness: There is no abdominal tenderness.  Musculoskeletal:        General: No deformity.     Right lower leg: No edema.     Left lower leg: No edema.  Skin:    General: Skin is warm and dry.  Neurological:     Mental Status: She is alert.     GCS: GCS eye subscore is 4. GCS verbal subscore is 4. GCS motor subscore is 6.     Motor: No abnormal muscle tone.     Comments: She is oriented to person, she knows that it is 2020, not oriented to location, or month.  She is occasionally able to answer simple questions however when sentences require more than 1-2 words she simply begins mumbling. 5/5 strength bilateral grip strength.  She is able to lift both legs off bed without difficulty.  No obvious facial droop.  Psychiatric:        Speech: Speech is delayed.        Behavior: Behavior is cooperative.      ED Treatments / Results  Labs (all labs ordered  are listed, but only abnormal results are displayed) Labs Reviewed  URINE CULTURE  CBC WITH DIFFERENTIAL/PLATELET  AMMONIA  COMPREHENSIVE METABOLIC PANEL  LIPASE, BLOOD  URINALYSIS, ROUTINE W REFLEX MICROSCOPIC    EKG None  Radiology Dg Chest Port 1 View  Result Date: 11/13/2018 CLINICAL DATA:  Confusion EXAM: PORTABLE CHEST 1 VIEW COMPARISON:  June 05, 2018 FINDINGS: There are multiple small calcified granulomas in the right lung. There is no edema or consolidation. Heart size and pulmonary vascularity are normal. No adenopathy. There is aortic atherosclerosis. No bone lesions. IMPRESSION: Calcified granulomas in right lung. No edema or consolidation. Heart size normal. Aortic Atherosclerosis (ICD10-I70.0). Electronically Signed   By: Lowella Grip III M.D.   On: 11/13/2018 15:57    Procedures Procedures (including critical care time)  Medications Ordered in ED Medications - No data to display   Initial Impression / Assessment and Plan / ED Course  I have reviewed the triage vital signs and the nursing notes.  Pertinent labs & imaging results that were available during my care of the patient were reviewed by me and considered in my medical decision making (see chart for details).  Clinical Course as of Nov 13 1607  Thu Nov 13, 2018  [facility] [place].  Dr. Conley Canal ports that patient had a fall 2 weeks ago and since then has been having a significant decline.  At time she is not speaking or interacting.    They spoke with patient's sister who wished for her to be evaluated in the ER.  They report that she has also been urinating a lot making them concern for urinary tract infection despite her poor p.o. intake. Shawnee Knapp Sister    [EH]    Clinical Course User Index [EH] Lorin Glass, PA-C      Patient presents today for evaluation of decline in functional status.  She had a fall 2 weeks ago and since then she has had a rapid decline.   Patient is unable to provide meaningful history.  According to nursing home chart review patient has had increased urinary output, however her urine culture from previous showed multiple species.  Given that she is having continued decline and has been reportedly falling recently we will obtain repeat CT head and neck to evaluate for delayed bleeding versus trauma from a repeat fall.  Order placed for in and out urine cath to obtain clean sample due to previous sample being contaminated in the setting of increased urination.    At shift change care was transferred to [facility] PA-C who will follow pending studies, re-evaulate and determine disposition.      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

## 2018-11-13 NOTE — ED Triage Notes (Signed)
Pt arrives via EMS from [facility] where staff reports unresponsiveness. Pt's home aid told EMS she was never unresponsive and has failure to thrive for 2 weeks. Pt has dementia.

## 2018-11-13 NOTE — ED Notes (Signed)
Patient transported to X-ray 

## 2018-11-13 NOTE — Care Management (Addendum)
ED CM spoke with the patient at the bedside. Patient is oriented to person only. CM called the patient's sister, Desiree Salinas to discuss the plan for discharge. Message left on [place] requesting a return call. ED CM contact information provided.   9:10p - Patient's sister returned call to ED CM. She states the patient lives in a locked memory care unit at [facility]. [name] states the patient had 24 hour care before she came to the hospital by 3 private care providers. She states she is now working with [facility] to provide additional care. She will need to call [facility] in the morning to arrange care if [name] will accept the patient back. [name] would like to be notified when ED CM is able to confirm whether or not [name] will accept the patient back.  [name] asked how the patient is doing. CM stated the patient was talking when CM was in the room.   ED CM called [name]. The staff member states she will call the Director of Nursing to inquire whether or not the patient can return there. Explained the patient's sister can arrange 24 hour care if she is able to return. [name] staff member provided with ED CM telephone number.

## 2018-11-13 NOTE — ED Notes (Signed)
Pt assisted with standing x1RN and x1EMT.  Pt is unable to sit up with verbal cues and guidance. Pt sat up with full assistance x2. Pt cannot stand on own ability. Pt must be lifted under arms x2. Pt cannot bear weight on own and must supported x2.  Pt cannot coordinate movement of feet in meaningful way. Cannot walk or shuffle feet.

## 2018-11-13 NOTE — ED Notes (Signed)
Purewick applied and pt changed into clean brief.

## 2018-11-14 DIAGNOSIS — F1027 Alcohol dependence with alcohol-induced persisting dementia: Secondary | ICD-10-CM | POA: Diagnosis not present

## 2018-11-14 MED ORDER — PINDOLOL 5 MG PO TABS
10.0000 mg | ORAL_TABLET | Freq: Every day | ORAL | Status: DC
  Administered 2018-11-14 – 2018-11-21 (×2): 10 mg via ORAL
  Filled 2018-11-14 (×3): qty 1
  Filled 2018-11-14: qty 2
  Filled 2018-11-14 (×4): qty 1
  Filled 2018-11-14: qty 2
  Filled 2018-11-14: qty 1
  Filled 2018-11-14: qty 2
  Filled 2018-11-14: qty 1

## 2018-11-14 MED ORDER — BUSPIRONE HCL 10 MG PO TABS
20.0000 mg | ORAL_TABLET | Freq: Every day | ORAL | Status: DC
  Administered 2018-11-15 – 2018-11-20 (×6): 20 mg via ORAL
  Filled 2018-11-14 (×7): qty 2

## 2018-11-14 MED ORDER — ESCITALOPRAM OXALATE 10 MG PO TABS
20.0000 mg | ORAL_TABLET | Freq: Every day | ORAL | Status: DC
  Administered 2018-11-14 – 2018-11-21 (×8): 20 mg via ORAL
  Filled 2018-11-14 (×8): qty 2

## 2018-11-14 MED ORDER — AMPHETAMINE-DEXTROAMPHET ER 5 MG PO CP24
15.0000 mg | ORAL_CAPSULE | Freq: Once | ORAL | Status: AC
  Administered 2018-11-14: 10:00:00 15 mg via ORAL
  Filled 2018-11-14 (×2): qty 3

## 2018-11-14 MED ORDER — QUETIAPINE FUMARATE 50 MG PO TABS
100.0000 mg | ORAL_TABLET | Freq: Every day | ORAL | Status: DC
  Administered 2018-11-15 – 2018-11-20 (×6): 100 mg via ORAL
  Filled 2018-11-14 (×5): qty 2
  Filled 2018-11-14: qty 1
  Filled 2018-11-14 (×2): qty 2

## 2018-11-14 MED ORDER — PANTOPRAZOLE SODIUM 40 MG PO TBEC
40.0000 mg | DELAYED_RELEASE_TABLET | Freq: Every day | ORAL | Status: DC
  Administered 2018-11-14 – 2018-11-21 (×8): 40 mg via ORAL
  Filled 2018-11-14 (×8): qty 1

## 2018-11-14 MED ORDER — AMPHETAMINE-DEXTROAMPHET ER 5 MG PO CP24
15.0000 mg | ORAL_CAPSULE | Freq: Every day | ORAL | Status: DC
  Administered 2018-11-15 – 2018-11-21 (×7): 15 mg via ORAL
  Filled 2018-11-14 (×8): qty 3

## 2018-11-14 MED ORDER — ACETAMINOPHEN 325 MG PO TABS
650.0000 mg | ORAL_TABLET | Freq: Two times a day (BID) | ORAL | Status: DC
  Administered 2018-11-14 – 2018-11-21 (×13): 650 mg via ORAL
  Filled 2018-11-14 (×15): qty 2

## 2018-11-14 MED ORDER — ATORVASTATIN CALCIUM 40 MG PO TABS
40.0000 mg | ORAL_TABLET | Freq: Every day | ORAL | Status: DC
  Administered 2018-11-14 – 2018-11-21 (×8): 40 mg via ORAL
  Filled 2018-11-14 (×8): qty 1

## 2018-11-14 MED ORDER — AMPHETAMINE-DEXTROAMPHET ER 15 MG PO CP24: 15.0000 mg | ORAL_CAPSULE | Freq: Every day | ORAL | Status: DC

## 2018-11-14 NOTE — Evaluation (Signed)
Physical Therapy Evaluation Patient Details Name: Desiree Salinas MRN: [id number] DOB: 16-Nov-1950 Today's Date: 11/14/2018   History of Present Illness  Pt is a 68 y/o female presenting to the ED secondary to failure to thrive. Imaging of hips, L spine, head and C spine negative for acute abnormality. PMH includes alcoholic dementia and COPD.   Clinical Impression  Pt presenting to the ED with problem above and deficits below. Pt with severe dementia and responding to questions with unrelated statements. Pt also with poor sequencing and did not assist with basic mobility tasks. Required total A to perform bed mobility and was reliant on at least max A to maintain sitting balance. Feel pt would benefit from SNF level care at d/c, as currently unsure what assist is available at ALF. Will continue to follow acutely to maximize functional mobility independence and safety.     Follow Up Recommendations SNF;Supervision/Assistance - 24 hour    Equipment Recommendations  Wheelchair (measurements PT);Wheelchair cushion (measurements PT);Other (comment)(hoyer lift and hoyer lift pad. )    Recommendations for Other Services       Precautions / Restrictions Precautions Precautions: Fall Restrictions Weight Bearing Restrictions: No      Mobility  Bed Mobility Overal bed mobility: Needs Assistance Bed Mobility: Supine to Sit;Sit to Supine     Supine to sit: Total assist Sit to supine: Total assist   General bed mobility comments: Total A for all bed mobility. Required at least max A to maintain sitting balance.   Transfers                 General transfer comment: Unable with +1 assist.   Ambulation/Gait                Stairs            Wheelchair Mobility    Modified Rankin (Stroke Patients Only)       Balance Overall balance assessment: Needs assistance Sitting-balance support: Bilateral upper extremity supported;Feet supported Sitting balance-Leahy Scale:  Zero Sitting balance - Comments: Reliant on max A to maintain sitting balance.                                      Pertinent Vitals/Pain Pain Assessment: Faces Faces Pain Scale: No hurt    Home Living Family/patient expects to be discharged to:: Assisted living                 Additional Comments: Per notes and case management, has 24/7 assist, but does not know if it is reliable 24/7 assist.     Prior Function           Comments: Unsure of PLOF, as pt unable to answer any questions. Assume she required total care     Hand Dominance        Extremity/Trunk Assessment   Upper Extremity Assessment Upper Extremity Assessment: Generalized weakness    Lower Extremity Assessment Lower Extremity Assessment: Generalized weakness    Cervical / Trunk Assessment Cervical / Trunk Assessment: Kyphotic  Communication   Communication: Expressive difficulties  Cognition Arousal/Alertness: Awake/alert Behavior During Therapy: WFL for tasks assessed/performed Overall Cognitive Status: History of cognitive impairments - at baseline                                 General Comments: Pt with hx  of dementia. Unable to answer questions and would begin talking about unrelated subjects when asked questions. Pt with difficulty sequencing and unable to assist with any mobility tasks.       General Comments      Exercises     Assessment/Plan    PT Assessment Patient needs continued PT services  PT Problem List Decreased strength;Decreased balance;Decreased mobility;Decreased cognition;Decreased knowledge of use of DME;Decreased knowledge of precautions;Decreased safety awareness       PT Treatment Interventions Gait training;DME instruction;Functional mobility training;Therapeutic activities;Therapeutic exercise;Balance training;Patient/family education;Cognitive remediation    PT Goals (Current goals can be found in the Care Plan section)   Acute Rehab PT Goals PT Goal Formulation: Patient unable to participate in goal setting Time For Goal Achievement: 11/28/18 Potential to Achieve Goals: Fair    Frequency Min 3X/week   Barriers to discharge Other (comment) Unsure of caregiver support     Co-evaluation               AM-PAC PT "6 Clicks" Mobility  Outcome Measure Help needed turning from your back to your side while in a flat bed without using bedrails?: Total Help needed moving from lying on your back to sitting on the side of a flat bed without using bedrails?: Total Help needed moving to and from a bed to a chair (including a wheelchair)?: Total Help needed standing up from a chair using your arms (e.g., wheelchair or bedside chair)?: Total Help needed to walk in hospital room?: Total Help needed climbing 3-5 steps with a railing? : Total 6 Click Score: 6    End of Session   Activity Tolerance: Patient tolerated treatment well Patient left: in bed;with call bell/phone within reach Nurse Communication: Mobility status PT Visit Diagnosis: Unsteadiness on feet (R26.81);Muscle weakness (generalized) (M62.81);Difficulty in walking, not elsewhere classified (R26.2)    Time: [id number] PT Time Calculation (min) (ACUTE ONLY): 14 min   Charges:   PT Evaluation $PT Eval Moderate Complexity: Norman, PT, DPT  Acute Rehabilitation Services  Pager: [phone] Office: [phone]   Rudean Hitt 11/14/2018, 1:24 PM

## 2018-11-14 NOTE — ED Notes (Signed)
Called CSW at [phone], CSW unable to answer phone at this time, option to leave a voice message unavailable. Will call back to ask for an update later

## 2018-11-14 NOTE — ED Notes (Signed)
Pt ate about 50% of her lunch tray.

## 2018-11-14 NOTE — ED Notes (Signed)
Dinner tray ordered.

## 2018-11-14 NOTE — ED Provider Notes (Signed)
12:20 AM Patient care assumed from [place], PA-C at shift change. In short, patient with hx of alcoholic dementia, COPD, anemia, anxiety, who presents today from [place] at [facility] for failure to thrive. Unable to ambulate in the ED. Laboratory evaluation and imaging has been stable and without acute finding. Care management has seen and assessed the patient and remain actively involved in her care. She will be holding in the ED overnight for pending PT/OT evaluation tomorrow.   Per Kenton Kingfisher, PA-C - [name] is happy to accept the patient back to their facility if able to coordinate 24-hour care.  7:00 AM Patient signed out to Ward, PA-C at shift change.   Antonietta Breach, PA-C 11/14/18 0700    Dina Rich Barbette Hair, MD 11/17/18 [phone]

## 2018-11-14 NOTE — ED Provider Notes (Signed)
Care assumed from previous provider PA [facility]. Please see note for further details. Case discussed, plan agreed upon. Will follow up on PT/OT/CM recs.   PT recommending SNF. Social worker is working with [employer] to arrange this.  Discussed with oncoming provider, Dr. Laverta Baltimore, who is aware of boarding status and plan.    Vayda Dungee, Ozella Almond, PA-C 11/14/18 1617    Sherwood Gambler, MD 11/14/18 [phone]

## 2018-11-14 NOTE — ED Notes (Signed)
Called pt sister [name]), provided update that pt would likely be staying in [facility] [place] until SNF placement Monday, pt sister said she is looking over the list of facilities provided still

## 2018-11-14 NOTE — ED Notes (Signed)
Spoke with pt sister [name]), gave update that pt is speaking to staff though not oriented, appears pleasant and open to help from staff, has been assessed by PT this AM, and that she will be called with updates about plan to be tansferred to [facility]. Pt sister reiterates that Dem[facility]ri[facility] Charity will be requiring 24hr care for pt to return there.

## 2018-11-14 NOTE — Care Management (Addendum)
CM Spoke with patient's sister who reports patient has a HHA who has been with her for 18 months, and would like  to know if she could come to visit her, CM explained due to Covid there has been some restriction on visitations but I will check with  ED Charge Nurse.

## 2018-11-14 NOTE — ED Notes (Signed)
RN attempted to give patient meds in applesauce but patient refused-Monique,RN

## 2018-11-14 NOTE — Care Management (Signed)
ED CM/ ED CSW spoke with Aliene Altes (sister) [phone] via phone concerning patient transitional care planning.  PT evaluation indicated patient would needs a higher level of care, and is recommending SNF placement,  [facility] is currently [name], they are recommending SNF before returning back to facility, sister is agreeable.  CSW e-mailed CMS SNF  quality list awaiting family selection to fax  out tonight.  TOC team will continue to follow.

## 2018-11-14 NOTE — ED Notes (Signed)
Tech and nurse ambulated pt from stretcher to bed in purple zone. Pt needed 2 assist when ambulating. Pt was soiled in urine. Pt was cleaned by this tech and nurse. Pt was put in new brief due to incontinence and new gown.

## 2018-11-14 NOTE — ED Notes (Addendum)
Pt required total assistance while eating breakfast. Pt had to be reminded to chew her food. Pt would keep mouth open while food was in her mouth. Pt drank about 140 ml of fluids and ate about 50% of her breakfast.

## 2018-11-14 NOTE — ED Notes (Signed)
Breakfast tray ordered 

## 2018-11-14 NOTE — Care Management (Signed)
CM spoke with [employer] [facility] who confirmed patient could have 1 visitor, Updated sister.

## 2018-11-14 NOTE — Progress Notes (Signed)
CSW sent secure email to [place], Pts sister prompting her to choose facilities based on her preference from [facility] Choice List. PASRR screening submitted; awaiting nurse review of application. Will submit FL2 to facilities chosen by [name], pts sister to preferred facilities choices along with facilities in greater [place] area.

## 2018-11-14 NOTE — Progress Notes (Signed)
CSW spoke with [place] at [place] [facility]. CSW informed [facility] that patient has been recommended for SNF. [facility] B[facility] reports that when patient came to them, patient was ambulatory and able to transfer on her own. Since patient returned to their facility on Monday, patient has primarily been bed ridden. [facility] is agreeable to take patient back once she can make some progress and will have their contracted staff with [facility] follow up with her care. CSW did explain to Roy that patient may have some barriers to SNF placement that can be address when she is faxed out to SNF facilities.   [place], LCSW Transitions of Care Department [facility] ED [phone]

## 2018-11-14 NOTE — NC FL2 (Signed)
[place] LEVEL OF CARE SCREENING TOOL     IDENTIFICATION  Patient Name: Desiree Salinas Birthdate: [birth year] Sex: female Admission Date (Current Location): 11/13/2018  [facility] and [place] Number:  Herbalist and Address:  [facility], [place] [address], [place], [place] [address]      Provider Number: [id number]  Attending Physician Name and Address:  Default, Provider, MD  Relative Name and Phone Number:  [name]) Sister [phone]    Current Level of Care: Hospital Recommended Level of Care: [place] Prior Approval Number:    Date Approved/Denied:   PASRR Number:    Discharge Plan: SNF    Current Diagnoses: Patient Active Problem List   Diagnosis Date Noted  . 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 Iron deficiency anemia 07/15/2018  . Fall 06/09/2018  . Alcoholic dementia ([place]) [id number]  . Degenerative scoliosis 02/10/2018  . Diarrhea 10/14/2013  . Cigarette smoker 08/08/2011  . VAIN I (vaginal intraepithelial neoplasia grade I)   . COLONIC POLYPS 12/21/2007  . ALLERGIC RHINITIS 12/21/2007  . 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 HYPERCHOLESTEROLEMIA 07/05/2007  . 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 DERMATITIS 07/05/2007  . SHINGLES, HX OF 07/05/2007    Orientation RESPIRATION BLADDER Height & Weight     Self  Normal Incontinent Weight: 130 lb 15.3 oz (59.4 kg) Height:  5\' 5"  (165.1 cm)  BEHAVIORAL SYMPTOMS/MOOD NEUROLOGICAL BOWEL NUTRITION STATUS      Incontinent Diet(Regular)  AMBULATORY STATUS COMMUNICATION OF NEEDS Skin   Extensive Assist Verbally Normal, Bruising(Bruising on legs and Buttocks from a hx of falls)                       Personal Care Assistance Level of Assistance  Total care Bathing Assistance: Limited assistance Feeding assistance: Limited assistance Dressing Assistance: Limited assistance Total Care Assistance: Limited  assistance   Functional Limitations Info  Sight, Hearing, Speech Sight Info: Impaired(wears eye seeing glasses) Hearing Info: Adequate Speech Info: Adequate    SPECIAL CARE FACTORS FREQUENCY  PT (By licensed PT), OT (By licensed OT)     PT Frequency: 3x weekly OT Frequency: 3x weekly            Contractures Contractures Info: Not present    Additional Factors Info  Code Status, Allergies Code Status Info: full code Allergies Info: Eucerin ( Basis Facial Moisturizer); Latex; Nikcel; Cobalt Psychotropic Info: see med list         Current Medications (11/14/2018):  This is the current hospital active medication list Current Facility-Administered Medications  Medication Dose Route Frequency Provider Last Rate Last Dose  . acetaminophen (TYLENOL) tablet 650 mg  650 mg Oral BID Antonietta Breach, PA-C   Stopped at 11/14/18 0436  . amphetamine-dextroamphetamine (ADDERALL XR) 24 hr capsule 1 capsule  15 mg Oral Daily Antonietta Breach, PA-C      . atorvastatin (LIPITOR) tablet 40 mg  40 mg Oral Daily Antonietta Breach, PA-C   40 mg at 11/14/18 1027  . busPIRone (BUSPAR) tablet 20 mg  20 mg Oral QHS Humes, Kelly, PA-C      . escitalopram (LEXAPRO) tablet 20 mg  20 mg Oral Daily Antonietta Breach, PA-C   20 mg at 11/14/18 1027  . pantoprazole (PROTONIX) EC tablet 40 mg  40 mg Oral Daily Antonietta Breach, PA-C   40 mg at 11/14/18 1027  . pindolol (VISKEN) tablet 10 mg  10 mg  Oral Daily Antonietta Breach, PA-C   10 mg at 11/14/18 1333  . QUEtiapine (SEROQUEL) tablet 100 mg  100 mg Oral QHS Antonietta Breach, PA-C   Stopped at 11/14/18 0436  . TDaP (BOOSTRIX) injection 0.5 mL  0.5 mL Intramuscular Once Noralee Space, MD       Current Outpatient Medications  Medication Sig Dispense Refill  . acetaminophen (TYLENOL) 325 MG tablet Take 650 mg by mouth 2 (two) times daily.    Marland Kitchen amphetamine-dextroamphetamine (ADDERALL XR) 15 MG 24 hr capsule Take 15 mg by mouth daily.     Marland Kitchen atorvastatin (LIPITOR) 40 MG tablet TAKE 1 BY  MOUTH DAILY (Patient taking differently: Take 40 mg by mouth daily. ) 30 tablet 3  . b complex vitamins capsule Take 1 capsule by mouth daily. 30 capsule 3  . busPIRone (BUSPAR) 10 MG tablet Take 20 mg by mouth at bedtime.     Marland Kitchen escitalopram (LEXAPRO) 20 MG tablet Take 20 mg by mouth daily.    . pantoprazole (PROTONIX) 40 MG tablet Take 40 mg by mouth daily.     . pindolol (VISKEN) 10 MG tablet Take 10 mg by mouth daily.     . QUEtiapine (SEROQUEL) 100 MG tablet Take 100 mg by mouth at bedtime.    . vitamin C (ASCORBIC ACID) 500 MG tablet Take 500 mg by mouth daily.       Discharge Medications: Please see discharge summary for a list of discharge medications.  Relevant Imaging Results:  Relevant Lab Results:   Additional Information SS# [id number]  Graham Hyun Dimitri Ped, LCSW

## 2018-11-15 DIAGNOSIS — F1027 Alcohol dependence with alcohol-induced persisting dementia: Secondary | ICD-10-CM | POA: Diagnosis not present

## 2018-11-15 LAB — URINE CULTURE: Culture: NO GROWTH

## 2018-11-15 NOTE — Progress Notes (Addendum)
9/12: [facility] is full and has a waitlist.  CSW spoke with patient's sister regarding bed offers for [employer] and [facility]. CSW noted that the sister requested time to research it. CSW informed her he will reach out to her preferred options to see if they have reviewed.  [facility]- unable to accept [place]- Left a v/m [facility]- Admissions not reviewed over weekend.  CSW will follow up with sister.   Lamonte Richer, LCSW, [place] Worker II [phone]

## 2018-11-15 NOTE — ED Provider Notes (Signed)
Emergency department observation rounding note  68 year old female who is been in this emergency department for greater than 45 hours at this time.  Originally presented for failure to thrive.  Medically cleared by previous team, PT recommending skilled nursing facility placement, social work involved to find placement.  Urinalysis nonacute Lipase within normal limits Ammonia level within normal limits CBC within normal limits CMP nonacute  DG lumbar:  IMPRESSION:  No definite acute fracture. S-shaped scoliotic curvature and diffuse  osteopenia.   DG hips:  IMPRESSION:  No acute osseous abnormality.   DG Chest:  IMPRESSION:  Calcified granulomas in right lung. No edema or consolidation. Heart  size normal. Aortic Atherosclerosis (ICD10-I70.0).   CT Head/C-spine:  IMPRESSION:  1. No acute intracranial pathology. Chronic parenchymal  calcifications and small-vessel white matter disease.    2. No fracture or static subluxation of the cervical spine.      Physical Exam  BP 134/76 (BP Location: Left Arm)   Pulse 84   Temp 98.3 F (36.8 C) (Oral)   Resp 18   Ht 5\' 5"  (1.651 m)   Wt 59.4 kg   SpO2 100%   BMI 21.79 kg/m   Physical Exam Constitutional:      General: She is not in acute distress.    Appearance: Normal appearance. She is well-developed. She is not ill-appearing or diaphoretic.  HENT:     Head: Normocephalic and atraumatic.     Right Ear: External ear normal.     Left Ear: External ear normal.     Nose: Nose normal.  Eyes:     General: Vision grossly intact. Gaze aligned appropriately.     Pupils: Pupils are equal, round, and reactive to light.  Neck:     Musculoskeletal: Normal range of motion.     Trachea: Trachea and phonation normal. No tracheal deviation.  Pulmonary:     Effort: Pulmonary effort is normal. No respiratory distress.  Musculoskeletal: Normal range of motion.  Skin:    General: Skin is warm and dry.  Neurological:   Comments: Sleeping  Psychiatric:        Behavior: Behavior normal.    ED Course/Procedures   Clinical Course as of Nov 15 1131  Thu Nov 13, 2018  1423 [facility] at [place].  Dr. Conley Canal ports that patient had a fall 2 weeks ago and since then has been having a significant decline.  At time she is not speaking or interacting.    They spoke with patient's sister who wished for her to be evaluated in the ER.  They report that she has also been urinating a lot making them concern for urinary tract infection despite her poor p.o. intake. Shawnee Knapp Sister    [EH]    Clinical Course User Index [EH] Lorin Glass, PA-C    Procedures  MDM  Patient evaluated approximately 8:30 AM.  No acute overnight events reported per Laurence Aly.  Patient evaluated while sleeping in bed comfortable appearing in no acute distress. Will allow patient to sleep, respiratory rate regular.  Vital signs 11/15/2018 6:17 AM: Temperature 98.3 F, pulse 84 bpm, blood pressure 134/76, respiratory rate 18, SPO2 100% on room air.  Patient remains medically cleared at this time awaiting skilled nursing facility placement.  I have been asked by case manager wanted to order COVID-19 screening test on this patient, nurse is to hold test until placement is found as this test must be within 48 hours of patient discharge.   Note:  Portions of this report may have been transcribed using voice recognition software. Every effort was made to ensure accuracy; however, inadvertent computerized transcription errors may still be present.   Gari Crown 11/15/18 1148    Little, Wenda Overland, MD 11/15/18 1150

## 2018-11-16 DIAGNOSIS — F1027 Alcohol dependence with alcohol-induced persisting dementia: Secondary | ICD-10-CM | POA: Diagnosis not present

## 2018-11-16 NOTE — ED Provider Notes (Signed)
Emergency Medicine Observation Re-evaluation Note  Desiree Salinas is a 68 y.o. female, seen on rounds today.  Pt initially presented to the ED for complaints of Altered Mental Status Currently, the patient is awaiting SNF placement and is medically cleared.  No overnight events per nursing.  Physical Exam  BP 120/66 (BP Location: Left Arm)   Pulse 98   Temp 99.3 F (37.4 C) (Axillary)   Resp 20   Ht 5\' 5"  (1.651 m)   Wt 59.4 kg   SpO2 98%   BMI 21.79 kg/m  Physical Exam Vitals signs and nursing note reviewed.  Constitutional:      General: She is not in acute distress.    Appearance: She is well-developed.     Comments: Patient is currently being fed breakfast.  No acute distress.  HENT:     Head: Normocephalic and atraumatic.  Eyes:     Conjunctiva/sclera: Conjunctivae normal.  Cardiovascular:     Rate and Rhythm: Normal rate.  Pulmonary:     Effort: Pulmonary effort is normal.  Neurological:     Mental Status: She is alert.  Psychiatric:        Mood and Affect: Mood normal.        Behavior: Behavior normal.     ED Course / MDM  EKG:  Clinical Course as of Nov 16 1226  Thu Nov 13, 2018  1423 [facility] at [place].  Dr. Conley Canal ports that patient had a fall 2 weeks ago and since then has been having a significant decline.  At time she is not speaking or interacting.    They spoke with patient's sister who wished for her to be evaluated in the ER.  They report that she has also been urinating a lot making them concern for urinary tract infection despite her poor p.o. intake. Shawnee Knapp Sister    [EH]    Clinical Course User Index [EH] Lorin Glass, PA-C   I have reviewed the labs performed to date as well as medications administered while in observation.  No recent changes in the last 24 hours. Plan  Current plan is awaiting SNF placement. Pt remains medically cleared. Patient is not under full IVC at this time.   Yaron Grasse, [place] N,  PA-C 11/16/18 [place], [place], DO 11/16/18 [phone]

## 2018-11-16 NOTE — ED Notes (Signed)
Pt friend Norina Buzzard [phone]

## 2018-11-16 NOTE — ED Notes (Signed)
Pt cleaned of urine, purewick replaced and pink pad placed on sacrum to prevent skin breakdown, being fed by tech

## 2018-11-16 NOTE — ED Notes (Signed)
Pt given happy hands fidget blanket

## 2018-11-16 NOTE — ED Notes (Signed)
Dinner tray ordered.

## 2018-11-16 NOTE — ED Notes (Addendum)
Pt tolerated eating well. Pt consumed 100% of lunch.

## 2018-11-16 NOTE — ED Notes (Signed)
Lunch tray ordered 

## 2018-11-16 NOTE — ED Notes (Signed)
Pt friend Redgie Grayer [phone]

## 2018-11-16 NOTE — ED Notes (Signed)
Pt consumed only about 25% of dinner. Pt wanted to rest and felt tired.

## 2018-11-17 DIAGNOSIS — F1027 Alcohol dependence with alcohol-induced persisting dementia: Secondary | ICD-10-CM | POA: Diagnosis not present

## 2018-11-17 NOTE — Progress Notes (Addendum)
CSW spoke with patient's sister Pakou at [phone] to determine if she had a made a decision regarding the bed offers that were presented to her. CSW informed [facility] that [facility] was not accepting new patients due to no bed availability. CSW emphasized to Leonila that the patient is ready for discharge so a decision needs to be made soon.   CSW contacted Whitney at [facility] to request a review of this patient.  Madilyn Fireman, MSW, LCSW-A Clinical Social Worker [facility] [place] [facility] [phone]

## 2018-11-17 NOTE — ED Notes (Signed)
Ate most of lunch-- had to be fed. Pleasantly confused, playing with activity belt--

## 2018-11-17 NOTE — ED Notes (Signed)
Private duty sitter here to visit- Pt recognizes her, very pleasant interaction

## 2018-11-17 NOTE — Progress Notes (Signed)
CSW attempted to contact Pts sister [name] without sucess. TOC will continue to follow pt for any discharge related needs.   [place] [facility]  Clinical Social Worker  Ph: [phone]

## 2018-11-17 NOTE — ED Notes (Signed)
RN picked up what looked like crushed blue and white capsule from patient's floor near door and threw away. Unknown if came from patient's room.

## 2018-11-17 NOTE — Progress Notes (Signed)
CSW received call back from Pts sister [name] who requested follow up on status of patients placement. CSW listed of facilities that have extended bed offer. [name] is agreeable to perusing placement at [facility].   TOC team will follow up regarding this matter.  [place] [facility]  Clinical Social Worker  Ph: [phone]

## 2018-11-17 NOTE — ED Notes (Signed)
Patient ate half of applesauce cup with medications-Monique,RN

## 2018-11-18 DIAGNOSIS — F1027 Alcohol dependence with alcohol-induced persisting dementia: Secondary | ICD-10-CM | POA: Diagnosis not present

## 2018-11-18 LAB — SARS CORONAVIRUS 2 BY RT PCR (HOSPITAL ORDER, PERFORMED IN ~~LOC~~ HOSPITAL LAB): SARS Coronavirus 2: NEGATIVE

## 2018-11-18 NOTE — Progress Notes (Addendum)
2:30pm: CSW attempted to reach patient's sister [name] to inform her of information that the patient cannot be discharged to a facility today, however no answer so a voicemail was left requesting a return call.  11:30am: CSW spoke with [name] in admissions at [facility] who informed CSW that due to the patient lacking a qualifying diagnosis for a SNF stay, her insurance would not cover her rehab stay. CSW spoke with Jacqlyn Larsen, RN about barrier. CSW and Tutuilla, RN CM will continue working on discharge planning.  11am: CSW has been in contact with patient's sister [name]  to inform her of [facility] rescinding their bed offer. Khamiah requested that CSW contact [employer] to determine if they would be willing to allow the patient's private duty caregiver. CSW spoke with [name] in admissions at [facility] who stated the caregiver would be allowed but only after the patient was brought there and settled in to see how she adjusts to the new environment. CSW informed [name] of information. [name] reports she has a scheduled call with [name] (ALF) to determine if the patient can return there with [facility] services.  CSW to await return call.  Madilyn Fireman, MSW, LCSW-A Clinical Social Worker [facility] [place] [facility] [phone]

## 2018-11-18 NOTE — ED Notes (Signed)
Meal tray bedside

## 2018-11-18 NOTE — Progress Notes (Signed)
Physical Therapy Treatment Patient Details Name: Desiree Salinas MRN: [id number] DOB: 09-Aug-1950 Today's Date: 11/18/2018    History of Present Illness Pt is a 68 y/o female presenting to the ED secondary to failure to thrive. Imaging of hips, L spine, head and C spine negative for acute abnormality. PMH includes alcoholic dementia and COPD.     PT Comments    Patient received in bed, pleasantly confused and often confabulating about totally unrelated subjects other than the questions asked her by therapist today. Attempted bed mobility but patient physically resisting, so redirected in conversation and by switching to attempt at bed exercises, which patient required Max to [place] for. Again attempted functional bed mobility, able to complete supine to sit with totalA, however she physically resisted sitting at EOB and was returned to supine with totalA. She was positioned to comfort with fidget vest and left in bed with all needs met this afternoon.     Follow Up Recommendations  SNF;Supervision/Assistance - 24 hour     Equipment Recommendations  Wheelchair (measurements PT);Wheelchair cushion (measurements PT);Other (comment)    Recommendations for Other Services       Precautions / Restrictions Precautions Precautions: Fall Restrictions Weight Bearing Restrictions: No    Mobility  Bed Mobility Overal bed mobility: Needs Assistance Bed Mobility: Supine to Sit;Sit to Supine     Supine to sit: Total assist Sit to supine: Total assist   General bed mobility comments: TotalA for supine to sit, patient then became resistant to EOB activities and was returned to supine with totalA  Transfers                 General transfer comment: Unable with +1 assist.   Ambulation/Gait                 Stairs             Wheelchair Mobility    Modified Rankin (Stroke Patients Only)       Balance Overall balance assessment: Needs assistance Sitting-balance  support: Bilateral upper extremity supported;Feet supported Sitting balance-Leahy Scale: Zero Sitting balance - Comments: Reliant on max A to maintain sitting balance.                                     Cognition Arousal/Alertness: Awake/alert Behavior During Therapy: WFL for tasks assessed/performed Overall Cognitive Status: History of cognitive impairments - at baseline                                 General Comments: Pt with hx of dementia. Unable to answer questions and would begin talking about unrelated subjects when asked questions. Pt with difficulty sequencing and unable to assist with any mobility tasks.       Exercises      General Comments        Pertinent Vitals/Pain Pain Assessment: Faces Pain Score: 0-No pain Faces Pain Scale: No hurt    Home Living                      Prior Function            PT Goals (current goals can now be found in the care plan section) Acute Rehab PT Goals PT Goal Formulation: Patient unable to participate in goal setting Time For Goal Achievement: 11/28/18 Potential to Achieve  Goals: Fair Progress towards PT goals: Not progressing toward goals - comment(session limited by cognition)    Frequency    Min 3X/week      PT Plan Current plan remains appropriate    Co-evaluation              AM-PAC PT "6 Clicks" Mobility   Outcome Measure  Help needed turning from your back to your side while in a flat bed without using bedrails?: Total Help needed moving from lying on your back to sitting on the side of a flat bed without using bedrails?: Total Help needed moving to and from a bed to a chair (including a wheelchair)?: Total Help needed standing up from a chair using your arms (e.g., wheelchair or bedside chair)?: Total Help needed to walk in hospital room?: Total Help needed climbing 3-5 steps with a railing? : Total 6 Click Score: 6    End of Session   Activity Tolerance:  Patient tolerated treatment well Patient left: in bed;with call bell/phone within reach   PT Visit Diagnosis: Unsteadiness on feet (R26.81);Muscle weakness (generalized) (M62.81);Difficulty in walking, not elsewhere classified (R26.2)     Time: 1975-8832 PT Time Calculation (min) (ACUTE ONLY): 10 min  Charges:  $Therapeutic Activity: 8-22 mins                     Deniece Ree PT, DPT, CBIS  Supplemental Physical Therapist [place]    Pager [phone] Acute Rehab Office [phone]

## 2018-11-18 NOTE — ED Notes (Signed)
Pt was cleaned and changed

## 2018-11-18 NOTE — ED Notes (Signed)
Pt's caregiver, [name] [phone] - advised she is leaving at this time. States sister, [name], asked her to stay in 2-hour increments. Pt noted to be res

## 2018-11-18 NOTE — ED Notes (Addendum)
Pt being fed lunch by staff.

## 2018-11-18 NOTE — ED Notes (Signed)
Pt refuse dinner at the moment

## 2018-11-18 NOTE — ED Notes (Signed)
Copy aware of no Chartered certified accountant.

## 2018-11-19 DIAGNOSIS — F1027 Alcohol dependence with alcohol-induced persisting dementia: Secondary | ICD-10-CM | POA: Diagnosis not present

## 2018-11-19 NOTE — NC FL2 (Signed)
[place] LEVEL OF CARE SCREENING TOOL     IDENTIFICATION  Patient Name: Desiree Salinas Birthdate: 11-20-1950 Sex: female Admission Date (Current Location): 11/13/2018  [facility] and [place] Number:  Herbalist and Address:  [facility] [place]. [facility] [place] [address] [place], [place] [address]      Provider Number: [id number]  Attending Physician Name and Address:  Default, Provider, MD  Relative Name and Phone Number:  [name]) Sister [phone]    Current Level of Care: Hospital Recommended Level of Care: [place], Memory Care Prior Approval Number:    Date Approved/Denied:   PASRR Number:    Discharge Plan: Other (Comment)(ALF)    Current Diagnoses: Patient Active Problem List   Diagnosis Date Noted  . 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 Iron deficiency anemia 07/15/2018  . Fall 06/09/2018  . Alcoholic dementia ([place]) [id number]  . Degenerative scoliosis 02/10/2018  . Diarrhea 10/14/2013  . Cigarette smoker 08/08/2011  . VAIN I (vaginal intraepithelial neoplasia grade I)   . COLONIC POLYPS 12/21/2007  . ALLERGIC RHINITIS 12/21/2007  . 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 HYPERCHOLESTEROLEMIA 07/05/2007  . 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 DERMATITIS 07/05/2007  . SHINGLES, HX OF 07/05/2007    Orientation RESPIRATION BLADDER Height & Weight     Self  Normal Incontinent Weight: 130 lb 15.3 oz (59.4 kg) Height:  5\' 5"  (165.1 cm)  BEHAVIORAL SYMPTOMS/MOOD NEUROLOGICAL BOWEL NUTRITION STATUS      Incontinent Diet(Regular)  AMBULATORY STATUS COMMUNICATION OF NEEDS Skin   Extensive Assist Verbally Normal, Bruising(Bruising on legs and Buttocks from a hx of falls)                       Personal Care Assistance Level of Assistance  Bathing, Feeding, Dressing Bathing Assistance: Limited assistance Feeding assistance: Limited assistance Dressing Assistance: Limited  assistance Total Care Assistance: Limited assistance   Functional Limitations Info  Sight, Hearing, Speech Sight Info: Impaired(wears eye seeing glasses) Hearing Info: Adequate Speech Info: Adequate    SPECIAL CARE FACTORS FREQUENCY  PT (By licensed PT), OT (By licensed OT)     PT Frequency: 3x weekly OT Frequency: 3x weekly            Contractures Contractures Info: Not present    Additional Factors Info  Code Status, Allergies Code Status Info: full code Allergies Info: Eucerin ( Basis Facial Moisturizer); Latex; Nikcel; Cobalt Psychotropic Info: see med list         Current Medications (11/19/2018):  This is the current hospital active medication list Current Facility-Administered Medications  Medication Dose Route Frequency Provider Last Rate Last Dose  . acetaminophen (TYLENOL) tablet 650 mg  650 mg Oral BID Antonietta Breach, PA-C   650 mg at 11/18/18 2149  . amphetamine-dextroamphetamine (ADDERALL XR) 24 hr capsule 15 mg  15 mg Oral Daily Antonietta Breach, PA-C   15 mg at 11/18/18 0855  . atorvastatin (LIPITOR) tablet 40 mg  40 mg Oral Daily Antonietta Breach, PA-C   40 mg at 11/18/18 0901  . busPIRone (BUSPAR) tablet 20 mg  20 mg Oral QHS Antonietta Breach, PA-C   20 mg at 11/18/18 2149  . escitalopram (LEXAPRO) tablet 20 mg  20 mg Oral Daily Antonietta Breach, PA-C   20 mg at 11/18/18 0902  . pantoprazole (PROTONIX) EC tablet 40 mg  40 mg Oral Daily Antonietta Breach, PA-C   40 mg at 11/18/18  [id number]  . pindolol (VISKEN) tablet 10 mg  10 mg Oral Daily Antonietta Breach, PA-C   10 mg at 11/14/18 1333  . QUEtiapine (SEROQUEL) tablet 100 mg  100 mg Oral QHS Antonietta Breach, PA-C   100 mg at 11/18/18 2149  . TDaP (BOOSTRIX) injection 0.5 mL  0.5 mL Intramuscular Once Noralee Space, MD       Current Outpatient Medications  Medication Sig Dispense Refill  . acetaminophen (TYLENOL) 325 MG tablet Take 650 mg by mouth 2 (two) times daily.    Marland Kitchen amphetamine-dextroamphetamine (ADDERALL XR) 15 MG 24 hr capsule  Take 15 mg by mouth daily.     Marland Kitchen atorvastatin (LIPITOR) 40 MG tablet TAKE 1 BY MOUTH DAILY (Patient taking differently: Take 40 mg by mouth daily. ) 30 tablet 3  . b complex vitamins capsule Take 1 capsule by mouth daily. 30 capsule 3  . busPIRone (BUSPAR) 10 MG tablet Take 20 mg by mouth at bedtime.     Marland Kitchen escitalopram (LEXAPRO) 20 MG tablet Take 20 mg by mouth daily.    . pantoprazole (PROTONIX) 40 MG tablet Take 40 mg by mouth daily.     . pindolol (VISKEN) 10 MG tablet Take 10 mg by mouth daily.     . QUEtiapine (SEROQUEL) 100 MG tablet Take 100 mg by mouth at bedtime.    . vitamin C (ASCORBIC ACID) 500 MG tablet Take 500 mg by mouth daily.       Discharge Medications: Please see discharge summary for a list of discharge medications.  Relevant Imaging Results:  Relevant Lab Results:   Additional Information SS# [id number]    Barbette Or, [place]

## 2018-11-19 NOTE — Progress Notes (Signed)
[facility]) Palliative and Hospice Services  Received referral for hospice and or palliative services, prior to the pt returning to the ED, from [facility].  Per notes, pt will likely d/c to SNF.    Please let [facility] know if we should plan to see her at the chosen SNF for palliative services.  Thank you, Venia Carbon RN, BSN, [facility] Liaison (in [place]) [phone]

## 2018-11-19 NOTE — ED Notes (Signed)
Pt purewick in place and patient resting comfortably.

## 2018-11-19 NOTE — ED Notes (Signed)
Sitter here for safety.

## 2018-11-19 NOTE — ED Notes (Addendum)
Meds crushed and given to pt in applesauce. Pt tolerated well. Pt confused at this time. States "what are we going to do with all the kids". Pt talking about all kind of things, not related to her or being here at the hospital. Pure wick in place and working properly. Pt dry at this time.

## 2018-11-19 NOTE — ED Notes (Signed)
Pt given bed bath

## 2018-11-19 NOTE — Progress Notes (Addendum)
3:35pm: CSW received call from Jimmie Molly at [facility] stating the facility can accept this patient on Friday morning. Pamala Hurry reports the facility needs time to prepare the patient's room and arrange for her services. Pamala Hurry has been in contact with patient's sister [name] also.  10am: CSW spoke with patient's sister [name] to obtain her permission to send patient's information to [facility] for review, [name] agreeable. CSW spoke with Jimmie Molly at [facility] who agreed to review information. CSW sent over information for review.  Madilyn Fireman, MSW, LCSW-A Clinical Social Worker Trans[facility] [place] [facility] [phone]

## 2018-11-19 NOTE — ED Notes (Signed)
Patient denies pain and is resting comfortably.  

## 2018-11-19 NOTE — NC FL2 (Signed)
[place] LEVEL OF CARE SCREENING TOOL     IDENTIFICATION  Patient Name: Desiree Salinas Birthdate: 1951-01-29 Sex: female Admission Date (Current Location): 11/13/2018  [facility] and [place] Number:  Herbalist and Address:  [facility] [place]. [facility] [place] [address], [place], [place] [address]      Provider Number: [id number]  Attending Physician Name and Address:  Default, Provider, MD  Relative Name and Phone Number:  [name]) Sister [phone]    Current Level of Care: Hospital Recommended Level of Care: [place], Memory Care Prior Approval Number:    Date Approved/Denied:   PASRR Number:    Discharge Plan: SNF    Current Diagnoses: Patient Active Problem List   Diagnosis Date Noted  . 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 Iron deficiency anemia 07/15/2018  . Fall 06/09/2018  . Alcoholic dementia ([place]) [id number]  . Degenerative scoliosis 02/10/2018  . Diarrhea 10/14/2013  . Cigarette smoker 08/08/2011  . VAIN I (vaginal intraepithelial neoplasia grade I)   . COLONIC POLYPS 12/21/2007  . ALLERGIC RHINITIS 12/21/2007  . 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 HYPERCHOLESTEROLEMIA 07/05/2007  . 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 DERMATITIS 07/05/2007  . SHINGLES, HX OF 07/05/2007    Orientation RESPIRATION BLADDER Height & Weight     Self  Normal Incontinent Weight: 130 lb 15.3 oz (59.4 kg) Height:  5\' 5"  (165.1 cm)  BEHAVIORAL SYMPTOMS/MOOD NEUROLOGICAL BOWEL NUTRITION STATUS      Incontinent Diet(Regular)  AMBULATORY STATUS COMMUNICATION OF NEEDS Skin   Extensive Assist Verbally Normal, Bruising(Bruising on legs and Buttocks from a hx of falls)                       Personal Care Assistance Level of Assistance  Total care Bathing Assistance: Limited assistance Feeding assistance: Limited assistance Dressing Assistance: Limited assistance Total Care  Assistance: Limited assistance   Functional Limitations Info  Sight, Hearing, Speech Sight Info: Impaired(wears eye seeing glasses) Hearing Info: Adequate Speech Info: Adequate    SPECIAL CARE FACTORS FREQUENCY  PT (By licensed PT), OT (By licensed OT)     PT Frequency: 3x weekly OT Frequency: 3x weekly            Contractures Contractures Info: Not present    Additional Factors Info  Code Status, Allergies Code Status Info: full code Allergies Info: Eucerin ( Basis Facial Moisturizer); Latex; Nikcel; Cobalt Psychotropic Info: see med list         Current Medications (11/19/2018):  This is the current hospital active medication list Current Facility-Administered Medications  Medication Dose Route Frequency Provider Last Rate Last Dose  . acetaminophen (TYLENOL) tablet 650 mg  650 mg Oral BID Antonietta Breach, PA-C   650 mg at 11/18/18 2149  . amphetamine-dextroamphetamine (ADDERALL XR) 24 hr capsule 15 mg  15 mg Oral Daily Antonietta Breach, PA-C   15 mg at 11/18/18 0855  . atorvastatin (LIPITOR) tablet 40 mg  40 mg Oral Daily Antonietta Breach, PA-C   40 mg at 11/18/18 0901  . busPIRone (BUSPAR) tablet 20 mg  20 mg Oral QHS Antonietta Breach, PA-C   20 mg at 11/18/18 2149  . escitalopram (LEXAPRO) tablet 20 mg  20 mg Oral Daily Antonietta Breach, PA-C   20 mg at 11/18/18 0902  . pantoprazole (PROTONIX) EC tablet 40 mg  40 mg Oral Daily Antonietta Breach, PA-C   40 mg at 11/18/18 [id number]  .  pindolol (VISKEN) tablet 10 mg  10 mg Oral Daily Antonietta Breach, PA-C   10 mg at 11/14/18 1333  . QUEtiapine (SEROQUEL) tablet 100 mg  100 mg Oral QHS Antonietta Breach, PA-C   100 mg at 11/18/18 2149  . TDaP (BOOSTRIX) injection 0.5 mL  0.5 mL Intramuscular Once Noralee Space, MD       Current Outpatient Medications  Medication Sig Dispense Refill  . acetaminophen (TYLENOL) 325 MG tablet Take 650 mg by mouth 2 (two) times daily.    Marland Kitchen amphetamine-dextroamphetamine (ADDERALL XR) 15 MG 24 hr capsule Take 15 mg by mouth  daily.     Marland Kitchen atorvastatin (LIPITOR) 40 MG tablet TAKE 1 BY MOUTH DAILY (Patient taking differently: Take 40 mg by mouth daily. ) 30 tablet 3  . b complex vitamins capsule Take 1 capsule by mouth daily. 30 capsule 3  . busPIRone (BUSPAR) 10 MG tablet Take 20 mg by mouth at bedtime.     Marland Kitchen escitalopram (LEXAPRO) 20 MG tablet Take 20 mg by mouth daily.    . pantoprazole (PROTONIX) 40 MG tablet Take 40 mg by mouth daily.     . pindolol (VISKEN) 10 MG tablet Take 10 mg by mouth daily.     . QUEtiapine (SEROQUEL) 100 MG tablet Take 100 mg by mouth at bedtime.    . vitamin C (ASCORBIC ACID) 500 MG tablet Take 500 mg by mouth daily.       Discharge Medications: Please see discharge summary for a list of discharge medications.  Relevant Imaging Results:  Relevant Lab Results:   Additional Information SS# [id number]   Barbette Or, [place]

## 2018-11-20 DIAGNOSIS — F1027 Alcohol dependence with alcohol-induced persisting dementia: Secondary | ICD-10-CM | POA: Diagnosis not present

## 2018-11-20 MED ORDER — TUBERCULIN PPD 5 UNIT/0.1ML ID SOLN
5.0000 [IU] | INTRADERMAL | Status: DC
  Administered 2018-11-20: 5 [IU] via INTRADERMAL
  Filled 2018-11-20: qty 0.1

## 2018-11-20 NOTE — ED Notes (Signed)
Pt resting comfortably. Pure wick in place and working properly.

## 2018-11-20 NOTE — Progress Notes (Signed)
CSW spoke with Pamala Hurry at [facility] who is requesting this patient have a TB skin test placed prior to coming to the facility tomorrow morning. CSW notified patient's RN of request.  Madilyn Fireman, MSW, LCSW-A Clinical Social Worker   Transitions of [place] [facility]   Medical ICU [phone]

## 2018-11-20 NOTE — ED Notes (Signed)
Patient ate half of applesauce for snack with crushed medications-Monique,RN

## 2018-11-20 NOTE — ED Provider Notes (Signed)
9:36 AM Desiree Salinas is a 68 y.o. female, seen on rounds today.  Pt initially presented to the ED for complaints of Altered Mental Status Currently, the patient is awaiting SNF placement and is medically cleared.  No overnight events per nursing.  BP 124/78   Pulse 75   Temp 98.2 F (36.8 C) (Axillary)   Resp 16   Ht 5\' 5"  (1.651 m)   Wt 59.4 kg   SpO2 95%   BMI 21.79 kg/m     Domenic Moras, PA-C 11/20/18 [id number]    Isla Pence, MD 11/20/18 [phone]

## 2018-11-20 NOTE — ED Notes (Signed)
Breakfast tray ordered 

## 2018-11-20 NOTE — Progress Notes (Signed)
Physical Therapy Treatment Patient Details Name: Desiree Salinas MRN: [id number] DOB: Apr 08, 1950 Today's Date: 11/20/2018    History of Present Illness Pt is a 68 y/o female presenting to the ED secondary to failure to thrive. Imaging of hips, L spine, head and C spine negative for acute abnormality. PMH includes alcoholic dementia and COPD.     PT Comments    Pt progressing towards goals. Pt initially resistive to mobility, however, responds well to known caregiver who was in the room. Was able to stand with max A +2, however, pt with heavy posterior lean. Did follow some commands to perform LAQ in sitting. Current recommendations appropriate. Will continue to follow acutely to maximize functional mobility independence and safety.     Follow Up Recommendations  SNF;Supervision/Assistance - 24 hour     Equipment Recommendations  Wheelchair (measurements PT);Wheelchair cushion (measurements PT);Other (comment)(hoyer lift; hoyer lift pad; caregiver requesting lift chair)    Recommendations for Other Services       Precautions / Restrictions Precautions Precautions: Fall Restrictions Weight Bearing Restrictions: No    Mobility  Bed Mobility Overal bed mobility: Needs Assistance Bed Mobility: Supine to Sit;Sit to Supine     Supine to sit: Total assist;+2 for physical assistance Sit to supine: Total assist;+2 for physical assistance   General bed mobility comments: Total A +2 for bed mobility tasks. Pt's aide in room and assisted with mobility. Pt initially resistive.   Transfers Overall transfer level: Needs assistance Equipment used: 2 person hand held assist Transfers: Sit to/from Stand Sit to Stand: Max assist;+2 physical assistance         General transfer comment: Max A +2 to come to standing. Pt initiating movement, however, presenting with heavy posterior lean once standing.   Ambulation/Gait                 Stairs             Wheelchair  Mobility    Modified Rankin (Stroke Patients Only)       Balance Overall balance assessment: Needs assistance Sitting-balance support: Bilateral upper extremity supported;Feet supported Sitting balance-Leahy Scale: Poor Sitting balance - Comments: Reliant on external support to maintain sitting balance. Pt responds well to aide to assist with mobility.  Postural control: Posterior lean Standing balance support: Bilateral upper extremity supported;During functional activity Standing balance-Leahy Scale: Zero Standing balance comment: Max A +2 for balance secondary to posterior lean.                             Cognition Arousal/Alertness: Awake/alert Behavior During Therapy: Restless Overall Cognitive Status: History of cognitive impairments - at baseline                                 General Comments: Pt with hx of dementia. Unable to answer questions and would begin talking about unrelated subjects when asked questions. Pt with difficulty sequencing and required multimodal cues to perform mobility tasks. Resistive to tasks initially.       Exercises General Exercises - Lower Extremity Long Arc Quad: AROM;Both;10 reps;Seated(with multimodal cues)    General Comments        Pertinent Vitals/Pain Pain Assessment: Faces Faces Pain Scale: No hurt    Home Living                      Prior Function  PT Goals (current goals can now be found in the care plan section) Acute Rehab PT Goals Patient Stated Goal: none stated PT Goal Formulation: Patient unable to participate in goal setting Time For Goal Achievement: 11/28/18 Potential to Achieve Goals: Fair Progress towards PT goals: Progressing toward goals    Frequency    Min 2X/week      PT Plan Current plan remains appropriate    Co-evaluation              AM-PAC PT "6 Clicks" Mobility   Outcome Measure  Help needed turning from your back to your side  while in a flat bed without using bedrails?: Total Help needed moving from lying on your back to sitting on the side of a flat bed without using bedrails?: Total Help needed moving to and from a bed to a chair (including a wheelchair)?: Total Help needed standing up from a chair using your arms (e.g., wheelchair or bedside chair)?: Total Help needed to walk in hospital room?: Total Help needed climbing 3-5 steps with a railing? : Total 6 Click Score: 6    End of Session   Activity Tolerance: Patient tolerated treatment well Patient left: in bed;with call bell/phone within reach;with nursing/sitter in room Nurse Communication: Mobility status PT Visit Diagnosis: Unsteadiness on feet (R26.81);Muscle weakness (generalized) (M62.81);Difficulty in walking, not elsewhere classified (R26.2)     Time: [id number] PT Time Calculation (min) (ACUTE ONLY): 16 min  Charges:  $Therapeutic Activity: 8-22 mins                     Leighton Ruff, PT, DPT  Acute Rehabilitation Services  Pager: [phone] Office: [phone]    Rudean Hitt 11/20/2018, 5:27 PM

## 2018-11-20 NOTE — ED Notes (Signed)
No sitter at bedside. Charge RN and [facility] aware.

## 2018-11-21 DIAGNOSIS — I959 Hypotension, unspecified: Secondary | ICD-10-CM | POA: Diagnosis not present

## 2018-11-21 DIAGNOSIS — W19XXXA Unspecified fall, initial encounter: Secondary | ICD-10-CM | POA: Diagnosis not present

## 2018-11-21 DIAGNOSIS — R410 Disorientation, unspecified: Secondary | ICD-10-CM | POA: Diagnosis not present

## 2018-11-21 DIAGNOSIS — F1027 Alcohol dependence with alcohol-induced persisting dementia: Secondary | ICD-10-CM | POA: Diagnosis not present

## 2018-11-21 DIAGNOSIS — M2550 Pain in unspecified joint: Secondary | ICD-10-CM | POA: Diagnosis not present

## 2018-11-21 DIAGNOSIS — Z7401 Bed confinement status: Secondary | ICD-10-CM | POA: Diagnosis not present

## 2018-11-21 MED ORDER — LORAZEPAM 1 MG PO TABS
1.0000 mg | ORAL_TABLET | Freq: Once | ORAL | Status: AC
  Administered 2018-11-21: 10:00:00 1 mg via ORAL
  Filled 2018-11-21: qty 1

## 2018-11-21 NOTE — Progress Notes (Addendum)
CSW spoke with Jimmie Molly at [facility] who states this can arrive anytime after 12pm today. Patient will go to room 305 at [facility]. Patient will bet transported via [facility] at 12pm. The number to call for report is [phone] and ask to speak with Jenny Reichmann, RN to give report. CSW confirmed that patient's TB skin test was placed yesterday and will be read at the facility.  CSW updated patient's RN Tawanna Solo. CSW updated patient's sister [name].  Madilyn Fireman, MSW, LCSW-A Clinical Social Worker   Transitions of [facility]   Medical ICU [phone]

## 2018-11-21 NOTE — ED Notes (Signed)
Patient's personal care giver at bedside

## 2018-11-21 NOTE — ED Notes (Signed)
D/C report called to [employer] [phone]) Caregiver took all PT's belongings to [facility] to set up the room.  VS: BP:108/69; HR:67; 96%RA; RR:19

## 2018-11-25 ENCOUNTER — Inpatient Hospital Stay (HOSPITAL_COMMUNITY)
Admission: EM | Admit: 2018-11-25 | Discharge: 2018-12-01 | DRG: 481 | Disposition: A | Discharge status: Skilled Nursing Facility | Payer: Medicare Other | Attending: Internal Medicine | Admitting: Internal Medicine

## 2018-11-25 ENCOUNTER — Emergency Department (HOSPITAL_COMMUNITY): Payer: Medicare Other

## 2018-11-25 ENCOUNTER — Encounter (HOSPITAL_COMMUNITY): Payer: Self-pay

## 2018-11-25 DIAGNOSIS — Z79899 Other long term (current) drug therapy: Secondary | ICD-10-CM

## 2018-11-25 DIAGNOSIS — R52 Pain, unspecified: Secondary | ICD-10-CM | POA: Diagnosis not present

## 2018-11-25 DIAGNOSIS — S299XXA Unspecified injury of thorax, initial encounter: Secondary | ICD-10-CM | POA: Diagnosis not present

## 2018-11-25 DIAGNOSIS — F0281 Dementia in other diseases classified elsewhere with behavioral disturbance: Secondary | ICD-10-CM | POA: Diagnosis present

## 2018-11-25 DIAGNOSIS — D62 Acute posthemorrhagic anemia: Secondary | ICD-10-CM | POA: Diagnosis not present

## 2018-11-25 DIAGNOSIS — W1830XA Fall on same level, unspecified, initial encounter: Secondary | ICD-10-CM | POA: Diagnosis present

## 2018-11-25 DIAGNOSIS — Z833 Family history of diabetes mellitus: Secondary | ICD-10-CM

## 2018-11-25 DIAGNOSIS — K219 Gastro-esophageal reflux disease without esophagitis: Secondary | ICD-10-CM | POA: Diagnosis present

## 2018-11-25 DIAGNOSIS — Y92009 Unspecified place in unspecified non-institutional (private) residence as the place of occurrence of the external cause: Secondary | ICD-10-CM

## 2018-11-25 DIAGNOSIS — E878 Other disorders of electrolyte and fluid balance, not elsewhere classified: Secondary | ICD-10-CM | POA: Diagnosis present

## 2018-11-25 DIAGNOSIS — S72141A Displaced intertrochanteric fracture of right femur, initial encounter for closed fracture: Principal | ICD-10-CM | POA: Diagnosis present

## 2018-11-25 DIAGNOSIS — R262 Difficulty in walking, not elsewhere classified: Secondary | ICD-10-CM | POA: Diagnosis not present

## 2018-11-25 DIAGNOSIS — Z03818 Encounter for observation for suspected exposure to other biological agents ruled out: Secondary | ICD-10-CM | POA: Diagnosis not present

## 2018-11-25 DIAGNOSIS — F419 Anxiety disorder, unspecified: Secondary | ICD-10-CM | POA: Diagnosis present

## 2018-11-25 DIAGNOSIS — R9431 Abnormal electrocardiogram [ECG] [EKG]: Secondary | ICD-10-CM | POA: Diagnosis not present

## 2018-11-25 DIAGNOSIS — Z888 Allergy status to other drugs, medicaments and biological substances status: Secondary | ICD-10-CM

## 2018-11-25 DIAGNOSIS — D72829 Elevated white blood cell count, unspecified: Secondary | ICD-10-CM

## 2018-11-25 DIAGNOSIS — S72009A Fracture of unspecified part of neck of unspecified femur, initial encounter for closed fracture: Secondary | ICD-10-CM | POA: Diagnosis present

## 2018-11-25 DIAGNOSIS — Z20828 Contact with and (suspected) exposure to other viral communicable diseases: Secondary | ICD-10-CM | POA: Diagnosis present

## 2018-11-25 DIAGNOSIS — R339 Retention of urine, unspecified: Secondary | ICD-10-CM | POA: Diagnosis not present

## 2018-11-25 DIAGNOSIS — R404 Transient alteration of awareness: Secondary | ICD-10-CM | POA: Diagnosis not present

## 2018-11-25 DIAGNOSIS — R531 Weakness: Secondary | ICD-10-CM | POA: Diagnosis not present

## 2018-11-25 DIAGNOSIS — F329 Major depressive disorder, single episode, unspecified: Secondary | ICD-10-CM | POA: Diagnosis present

## 2018-11-25 DIAGNOSIS — G309 Alzheimer's disease, unspecified: Secondary | ICD-10-CM | POA: Diagnosis not present

## 2018-11-25 DIAGNOSIS — Z23 Encounter for immunization: Secondary | ICD-10-CM | POA: Diagnosis not present

## 2018-11-25 DIAGNOSIS — Z87891 Personal history of nicotine dependence: Secondary | ICD-10-CM

## 2018-11-25 DIAGNOSIS — S0990XA Unspecified injury of head, initial encounter: Secondary | ICD-10-CM | POA: Diagnosis not present

## 2018-11-25 DIAGNOSIS — J449 Chronic obstructive pulmonary disease, unspecified: Secondary | ICD-10-CM

## 2018-11-25 DIAGNOSIS — Z419 Encounter for procedure for purposes other than remedying health state, unspecified: Secondary | ICD-10-CM

## 2018-11-25 DIAGNOSIS — F909 Attention-deficit hyperactivity disorder, unspecified type: Secondary | ICD-10-CM | POA: Diagnosis present

## 2018-11-25 DIAGNOSIS — J302 Other seasonal allergic rhinitis: Secondary | ICD-10-CM | POA: Diagnosis present

## 2018-11-25 DIAGNOSIS — W19XXXA Unspecified fall, initial encounter: Secondary | ICD-10-CM | POA: Diagnosis not present

## 2018-11-25 DIAGNOSIS — Z90710 Acquired absence of both cervix and uterus: Secondary | ICD-10-CM

## 2018-11-25 DIAGNOSIS — Z91040 Latex allergy status: Secondary | ICD-10-CM

## 2018-11-25 DIAGNOSIS — E785 Hyperlipidemia, unspecified: Secondary | ICD-10-CM | POA: Diagnosis present

## 2018-11-25 LAB — CBC WITH DIFFERENTIAL/PLATELET
Abs Immature Granulocytes: 0.09 10*3/uL — ABNORMAL HIGH (ref 0.00–0.07)
Basophils Absolute: 0.1 10*3/uL (ref 0.0–0.1)
Basophils Relative: 1 %
Eosinophils Absolute: 0.3 10*3/uL (ref 0.0–0.5)
Eosinophils Relative: 2 %
HCT: 37.1 % (ref 36.0–46.0)
Hemoglobin: 12.3 g/dL (ref 12.0–15.0)
Immature Granulocytes: 1 %
Lymphocytes Relative: 9 %
Lymphs Abs: 1.1 10*3/uL (ref 0.7–4.0)
MCH: 31.8 pg (ref 26.0–34.0)
MCHC: 33.2 g/dL (ref 30.0–36.0)
MCV: 95.9 fL (ref 80.0–100.0)
Monocytes Absolute: 0.7 10*3/uL (ref 0.1–1.0)
Monocytes Relative: 5 %
Neutro Abs: 10.4 10*3/uL — ABNORMAL HIGH (ref 1.7–7.7)
Neutrophils Relative %: 82 %
Platelets: 317 10*3/uL (ref 150–400)
RBC: 3.87 MIL/uL (ref 3.87–5.11)
RDW: 13.7 % (ref 11.5–15.5)
WBC: 12.7 10*3/uL — ABNORMAL HIGH (ref 4.0–10.5)
nRBC: 0 % (ref 0.0–0.2)

## 2018-11-25 LAB — COMPREHENSIVE METABOLIC PANEL
ALT: 17 U/L (ref 0–44)
AST: 21 U/L (ref 15–41)
Albumin: 3.5 g/dL (ref 3.5–5.0)
Alkaline Phosphatase: 79 U/L (ref 38–126)
Anion gap: 8 (ref 5–15)
BUN: 10 mg/dL (ref 8–23)
CO2: 22 mmol/L (ref 22–32)
Calcium: 8.4 mg/dL — ABNORMAL LOW (ref 8.9–10.3)
Chloride: 112 mmol/L — ABNORMAL HIGH (ref 98–111)
Creatinine, Ser: 0.86 mg/dL (ref 0.44–1.00)
GFR calc Af Amer: >60 mL/min (ref >60)
GFR calc non Af Amer: >60 mL/min (ref >60)
Glucose, Bld: 128 mg/dL — ABNORMAL HIGH (ref 70–99)
Potassium: 4.1 mmol/L (ref 3.5–5.1)
Sodium: 142 mmol/L (ref 135–145)
Total Bilirubin: 0.2 mg/dL — ABNORMAL LOW (ref 0.3–1.2)
Total Protein: 6 g/dL — ABNORMAL LOW (ref 6.5–8.1)

## 2018-11-25 MED ORDER — MORPHINE SULFATE (PF) 4 MG/ML IV SOLN
4.0000 mg | Freq: Once | INTRAVENOUS | Status: AC
  Administered 2018-11-25: 23:00:00 4 mg via INTRAVENOUS
  Filled 2018-11-25: qty 1

## 2018-11-25 MED ORDER — QUETIAPINE FUMARATE 50 MG PO TABS
100.0000 mg | ORAL_TABLET | Freq: Every day | ORAL | Status: DC
  Administered 2018-11-25: 100 mg via ORAL
  Filled 2018-11-25: qty 2

## 2018-11-25 MED ORDER — ONDANSETRON HCL 4 MG/2ML IJ SOLN
4.0000 mg | Freq: Once | INTRAMUSCULAR | Status: AC
  Administered 2018-11-25: 4 mg via INTRAVENOUS
  Filled 2018-11-25: qty 2

## 2018-11-25 MED ORDER — BUSPIRONE HCL 10 MG PO TABS
20.0000 mg | ORAL_TABLET | Freq: Every day | ORAL | Status: DC
  Administered 2018-11-25: 23:00:00 20 mg via ORAL
  Filled 2018-11-25: qty 2

## 2018-11-25 NOTE — ED Provider Notes (Signed)
11:44 PM  Assumed care from Dr. Tyrone Nine.  Patient is a 68 y.o. F with dementia who lives at a SNF who presents to ED with unwitnessed fall.  Has R intertrochanteric hip fracture.  Head CT pending.  Dr. Lorin Mercy with surgery aware of patient and will see in consult.  Likely surgery in AM.  Head CT pending.  Will need medicine admission.  12:35 AM Pt's head CT shows no acute intracranial abnormality.  COVID swab is negative.  Will discuss with medicine for admission.  Marland Kitchen1:30 AM Discussed patient's case with hospitalist, Dr. Marlowe Sax.  I have recommended admission and patient (and family if present) agree with this plan. Admitting physician will place admission orders.   I reviewed all nursing notes, vitals, pertinent previous records, EKGs, lab and urine results, imaging (as available).     Laquita Harlan, Delice Bison, DO 11/26/18 0130

## 2018-11-25 NOTE — ED Triage Notes (Signed)
Pt BIB EMS after a witnessed fall. Did not hit head/not on blood thinners. Complains of R. Hip pain, obvious deformity. Uses wheelchair, non-ambulatory, assistance to stand. 6 sec cap refill on R toes, 3 sec cap refill on L toes with EMS. No pulses either side (Hx of vascular disease). VS with EMS: 140/82, 85-90 HR, 16 RR, 96% RA.

## 2018-11-25 NOTE — ED Notes (Signed)
Patient transported to CT 

## 2018-11-25 NOTE — ED Provider Notes (Addendum)
[facility] EMERGENCY DEPARTMENT Provider Note   CSN: [id number] Arrival date & time: 11/25/18  2103     History   Chief Complaint Chief Complaint  Patient presents with  . Fall    HPI Desiree Salinas is a 68 y.o. female.     68 yo F with a chief complaints of a fall.  This was reported by her skilled nursing facility.  She apparently had a witnessed fall.  Complaining of right-sided hip pain.  Patient is demented and has difficulty providing history.  Level 5 caveat dementia.  The history is provided by the patient and the nursing home.  Fall This is a recurrent problem. The current episode started 3 to 5 hours ago. The problem occurs every several days. Pertinent negatives include no chest pain, no headaches and no shortness of breath. Nothing aggravates the symptoms. Nothing relieves the symptoms. She has tried nothing for the symptoms. The treatment provided no relief.    Past Medical History:  Diagnosis Date  . ADD (attention deficit disorder)   . Allergy   . Anemia   . Anxiety   . Arthritis   . Bruised rib 2020  . Cataract   . Colon polyps    HYPERPLASTIC POLYP  . COPD (chronic obstructive pulmonary disease) ([place])   . Dementia ([place])   . Depression   . Elevated cholesterol   . Shingles   . STD (sexually transmitted disease)    Condylomata  . Substance abuse ([place])    Stopped drinking one year ago.   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 Iron deficiency anemia 07/15/2018  . Fall 06/09/2018  . 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 Degenerative scoliosis 02/10/2018  . Diarrhea 10/14/2013  . Cigarette smoker 08/08/2011  . VAIN I (vaginal intraepithelial neoplasia grade I)   . COLONIC POLYPS 12/21/2007  . ALLERGIC RHINITIS 12/21/2007  . 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  HYPERCHOLESTEROLEMIA 07/05/2007  . 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 DERMATITIS 07/05/2007  . SHINGLES, HX OF 07/05/2007    Past Surgical History:  Procedure Laterality Date  . ABDOMINAL HYSTERECTOMY  1999   TAH  . ABDOMINAL SURGERY  1999   Abdominoplasty  . ACHILLES TENDON SURGERY    . AUGMENTATION MAMMAPLASTY     Implants  . COLONOSCOPY  2006  . POLYPECTOMY    . TONSILLECTOMY AND ADENOIDECTOMY    . TUBAL LIGATION       OB History    Gravida  4   Para  2   Term  2   Preterm      AB  2   Living  2     SAB      TAB      Ectopic      Multiple      Live Births               Home Medications    Prior to Admission medications   Medication Sig Start Date End Date Taking? Authorizing Provider  acetaminophen (TYLENOL) 325 MG tablet Take 650 mg by mouth 2 (two) times daily.    [provider]  amphetamine-dextroamphetamine (ADDERALL XR) 15 MG 24 hr capsule Take 15 mg by mouth daily.  06/19/18   [provider]  atorvastatin (LIPITOR) 40 MG tablet TAKE 1  BY MOUTH DAILY Patient taking differently: Take 40 mg by mouth daily.  06/23/14   Noralee Space, MD  b complex vitamins capsule Take 1 capsule by mouth daily. 08/20/18   Brunetta Genera, MD  busPIRone (BUSPAR) 10 MG tablet Take 20 mg by mouth at bedtime.     [provider]  escitalopram (LEXAPRO) 20 MG tablet Take 20 mg by mouth daily.    [provider]  pantoprazole (PROTONIX) 40 MG tablet Take 40 mg by mouth daily.  06/12/18   [provider]  pindolol (VISKEN) 10 MG tablet Take 10 mg by mouth daily.     [provider]  QUEtiapine (SEROQUEL) 100 MG tablet Take 100 mg by mouth at bedtime. 10/29/18   [provider]  vitamin C (ASCORBIC ACID) 500 MG tablet Take 500 mg by mouth daily.    [provider]    Family History Family History  Problem Relation Age of Onset  . Breast cancer Mother        Age 51's  . Diabetes  Maternal Grandmother   . Aneurysm Maternal Grandmother   . Aneurysm Maternal Aunt   . Aneurysm Maternal Grandfather   . Ankylosing spondylitis Father   . Colon cancer Neg Hx   . Colon polyps Neg Hx   . Esophageal cancer Neg Hx   . Rectal cancer Neg Hx   . Stomach cancer Neg Hx   . Dementia Neg Hx     Social History Social History   Tobacco Use  . Smoking status: Former Smoker    Packs/day: 0.90    Types: Cigarettes  . Smokeless tobacco: Never Used  . Tobacco comment: 10 cigarettes per day as of 04/17/2018. Not smoked since the fall as of 06/11/2018  Substance Use Topics  . Alcohol use: Not Currently    Alcohol/week: 0.0 standard drinks    Comment: none since May 2019  . Drug use: Not Currently    Types: Marijuana     Allergies   Eucerin [basis facial moisturizer], Latex, Nickel, and Cobalt   Review of Systems Review of Systems  Constitutional: Negative for chills and fever.  HENT: Negative for congestion and rhinorrhea.   Eyes: Negative for redness and visual disturbance.  Respiratory: Negative for shortness of breath and wheezing.   Cardiovascular: Negative for chest pain and palpitations.  Gastrointestinal: Negative for nausea and vomiting.  Genitourinary: Negative for dysuria and urgency.  Musculoskeletal: Positive for arthralgias and myalgias.  Skin: Negative for pallor and wound.  Neurological: Negative for dizziness and headaches.     Physical Exam Updated Vital Signs BP 130/76 (BP Location: Right Arm)   Pulse 93   Resp 16   SpO2 100%   Physical Exam Vitals signs and nursing note reviewed.  Constitutional:      General: She is not in acute distress.    Appearance: She is well-developed. She is not diaphoretic.  HENT:     Head: Normocephalic and atraumatic.  Eyes:     Pupils: Pupils are equal, round, and reactive to light.  Neck:     Musculoskeletal: Normal range of motion and neck supple.  Cardiovascular:     Rate and Rhythm: Normal rate and  regular rhythm.     Heart sounds: No murmur. No friction rub. No gallop.   Pulmonary:     Effort: Pulmonary effort is normal.     Breath sounds: No wheezing or rales.  Abdominal:     General: There is no  distension.     Palpations: Abdomen is soft.     Tenderness: There is no abdominal tenderness.  Musculoskeletal:        General: No tenderness.     Comments: Both lower extremities are flexed at the hip.  Her right lower extremity seems mildly shorter than the left.  She has some pain though it seems distractible.  Pulse and motor are intact.  Difficult to have cooperation long enough to tell if she has sensation.  Skin:    General: Skin is warm and dry.  Neurological:     Mental Status: She is alert and oriented to person, place, and time.  Psychiatric:        Behavior: Behavior normal.      ED Treatments / Results  Labs (all labs ordered are listed, but only abnormal results are displayed) Labs Reviewed  CBC WITH DIFFERENTIAL/PLATELET - Abnormal; Notable for the following components:      Result Value   WBC 12.7 (*)    Neutro Abs 10.4 (*)    Abs Immature Granulocytes 0.09 (*)    All other components within normal limits  COMPREHENSIVE METABOLIC PANEL - Abnormal; Notable for the following components:   Chloride 112 (*)    Glucose, Bld 128 (*)    Calcium 8.4 (*)    Total Protein 6.0 (*)    Total Bilirubin 0.2 (*)    All other components within normal limits  SARS CORONAVIRUS 2 (HOSPITAL ORDER, [facility])    EKG None  ED ECG REPORT   Date: 11/25/2018  Rate: 84  Rhythm: normal sinus rhythm  QRS Axis: right  Intervals: normal  ST/T Wave abnormalities: normal  Conduction Disutrbances:none  Narrative Interpretation:   Old EKG Reviewed: unchanged  I have personally reviewed the EKG tracing and agree with the computerized printout as noted.   Radiology Dg Chest 1 View  Result Date: 11/25/2018 CLINICAL DATA:  68 year old female with  fall and right hip fracture. EXAM: CHEST  1 VIEW COMPARISON:  Chest radiograph dated 11/13/2018 FINDINGS: Mild chronic bronchitic changes. No focal consolidation, pleural effusion, or pneumothorax. The cardiac silhouette is within normal limits. Atherosclerotic calcification of the aortic arch. No acute osseous pathology. IMPRESSION: No focal consolidation. Electronically Signed   By: Anner Crete M.D.   On: 11/25/2018 21:52   Dg Hip Unilat W Or Wo Pelvis 2-3 Views Right  Result Date: 11/25/2018 CLINICAL DATA:  68 year old female with fall and right hip pain. EXAM: DG HIP (WITH OR WITHOUT PELVIS) 2-3V RIGHT COMPARISON:  Right hip radiograph dated 11/13/2018 FINDINGS: There is a displaced and comminuted appearing intertrochanteric fracture of the right femur. The bones are osteopenic. There is no dislocation. The soft tissues are unremarkable. IMPRESSION: Displaced and comminuted intertrochanteric fracture of the right femur. Electronically Signed   By: Anner Crete M.D.   On: 11/25/2018 21:50    Procedures Procedures (including critical care time)  Medications Ordered in ED Medications  QUEtiapine (SEROQUEL) tablet 100 mg (100 mg Oral Given 11/25/18 2247)  busPIRone (BUSPAR) tablet 20 mg (20 mg Oral Given 11/25/18 2247)  morphine 4 MG/ML injection 4 mg (4 mg Intravenous Given 11/25/18 2250)  ondansetron (ZOFRAN) injection 4 mg (4 mg Intravenous Given 11/25/18 2251)     Initial Impression / Assessment and Plan / ED Course  I have reviewed the triage vital signs and the nursing notes.  Pertinent labs & imaging results that were available during my care of the patient were  reviewed by me and considered in my medical decision making (see chart for details).        68 yo F with a chief complaints of a fall.  This is apparently witnessed.  Complaining of right hip pain.  Patient's records reviewed and she was actually seen about 12 days ago with a similar complaint.  At that time both hips  were x-rayed and were normal.  Will obtain plain film.  Chest x-ray CT of the head for possible head injury with fall.  Basic lab work.  Reassess.  Plain film of the right hip with an intertrochanteric fracture.  I discussed the case with patient's sister.  She felt that she would want to have surgery performed if she needed it.  Will discuss with orthopedics.   I discussed case with Dr. Lorin Mercy.  Someone from his group will evaluate the patient for surgery likely tomorrow.  Lab work is returned with a mild leukocytosis.  No anemia.  Mild hyperchloremia.  Awaiting CT of the head.   Patient care was signed out to Dr. Leonides Schanz.  Please see her note for further details care in ED.  The patients results and plan were reviewed and discussed.   Any x-rays performed were independently reviewed by myself.   Differential diagnosis were considered with the presenting HPI.  Medications  QUEtiapine (SEROQUEL) tablet 100 mg (100 mg Oral Given 11/25/18 2247)  busPIRone (BUSPAR) tablet 20 mg (20 mg Oral Given 11/25/18 2247)  morphine 4 MG/ML injection 4 mg (4 mg Intravenous Given 11/25/18 2250)  ondansetron (ZOFRAN) injection 4 mg (4 mg Intravenous Given 11/25/18 2251)    Vitals:   11/25/18 2115 11/25/18 2236 11/25/18 2256 11/25/18 2324  BP: 94/81 (!) 142/93 (!) 142/93 130/76  Pulse:  87 93   Resp:  17 16   SpO2:  94% 100%     Final diagnoses:  Closed intertrochanteric fracture of hip, right, initial encounter [facility])       Final Clinical Impressions(s) / ED Diagnoses   Final diagnoses:  Closed intertrochanteric fracture of hip, right, initial encounter [facility])    ED Discharge Orders    None       Deno Etienne, DO 11/25/18 [place], [place], DO 11/25/18 2358

## 2018-11-25 NOTE — ED Notes (Signed)
Pt. Not available at this time for EKG. Will obtain and assess pt when they return to room

## 2018-11-26 ENCOUNTER — Encounter (HOSPITAL_COMMUNITY): Payer: Self-pay | Admitting: *Deleted

## 2018-11-26 ENCOUNTER — Inpatient Hospital Stay (HOSPITAL_COMMUNITY): Payer: Medicare Other | Admitting: Anesthesiology

## 2018-11-26 ENCOUNTER — Encounter (HOSPITAL_COMMUNITY)
Admission: EM | Disposition: A | Discharge status: Skilled Nursing Facility | Payer: Self-pay | Source: Home / Self Care | Attending: Internal Medicine

## 2018-11-26 ENCOUNTER — Inpatient Hospital Stay (HOSPITAL_COMMUNITY): Payer: Medicare Other

## 2018-11-26 ENCOUNTER — Other Ambulatory Visit: Payer: Self-pay

## 2018-11-26 DIAGNOSIS — E785 Hyperlipidemia, unspecified: Secondary | ICD-10-CM | POA: Diagnosis not present

## 2018-11-26 DIAGNOSIS — W1830XA Fall on same level, unspecified, initial encounter: Secondary | ICD-10-CM | POA: Diagnosis present

## 2018-11-26 DIAGNOSIS — D72829 Elevated white blood cell count, unspecified: Secondary | ICD-10-CM | POA: Diagnosis not present

## 2018-11-26 DIAGNOSIS — J449 Chronic obstructive pulmonary disease, unspecified: Secondary | ICD-10-CM

## 2018-11-26 DIAGNOSIS — F329 Major depressive disorder, single episode, unspecified: Secondary | ICD-10-CM | POA: Diagnosis not present

## 2018-11-26 DIAGNOSIS — F0391 Unspecified dementia with behavioral disturbance: Secondary | ICD-10-CM | POA: Diagnosis not present

## 2018-11-26 DIAGNOSIS — Z87891 Personal history of nicotine dependence: Secondary | ICD-10-CM | POA: Diagnosis not present

## 2018-11-26 DIAGNOSIS — Z888 Allergy status to other drugs, medicaments and biological substances status: Secondary | ICD-10-CM | POA: Diagnosis not present

## 2018-11-26 DIAGNOSIS — Z90710 Acquired absence of both cervix and uterus: Secondary | ICD-10-CM | POA: Diagnosis not present

## 2018-11-26 DIAGNOSIS — S72141A Displaced intertrochanteric fracture of right femur, initial encounter for closed fracture: Secondary | ICD-10-CM

## 2018-11-26 DIAGNOSIS — Z91040 Latex allergy status: Secondary | ICD-10-CM | POA: Diagnosis not present

## 2018-11-26 DIAGNOSIS — Z23 Encounter for immunization: Secondary | ICD-10-CM | POA: Diagnosis not present

## 2018-11-26 DIAGNOSIS — M2550 Pain in unspecified joint: Secondary | ICD-10-CM | POA: Diagnosis not present

## 2018-11-26 DIAGNOSIS — Y92009 Unspecified place in unspecified non-institutional (private) residence as the place of occurrence of the external cause: Secondary | ICD-10-CM

## 2018-11-26 DIAGNOSIS — J302 Other seasonal allergic rhinitis: Secondary | ICD-10-CM | POA: Diagnosis present

## 2018-11-26 DIAGNOSIS — D509 Iron deficiency anemia, unspecified: Secondary | ICD-10-CM | POA: Diagnosis not present

## 2018-11-26 DIAGNOSIS — D62 Acute posthemorrhagic anemia: Secondary | ICD-10-CM | POA: Diagnosis not present

## 2018-11-26 DIAGNOSIS — R339 Retention of urine, unspecified: Secondary | ICD-10-CM | POA: Diagnosis not present

## 2018-11-26 DIAGNOSIS — F0281 Dementia in other diseases classified elsewhere with behavioral disturbance: Secondary | ICD-10-CM | POA: Diagnosis not present

## 2018-11-26 DIAGNOSIS — R4182 Altered mental status, unspecified: Secondary | ICD-10-CM | POA: Diagnosis not present

## 2018-11-26 DIAGNOSIS — F419 Anxiety disorder, unspecified: Secondary | ICD-10-CM | POA: Diagnosis not present

## 2018-11-26 DIAGNOSIS — S72001A Fracture of unspecified part of neck of right femur, initial encounter for closed fracture: Secondary | ICD-10-CM

## 2018-11-26 DIAGNOSIS — Z79899 Other long term (current) drug therapy: Secondary | ICD-10-CM | POA: Diagnosis not present

## 2018-11-26 DIAGNOSIS — I959 Hypotension, unspecified: Secondary | ICD-10-CM | POA: Diagnosis not present

## 2018-11-26 DIAGNOSIS — R410 Disorientation, unspecified: Secondary | ICD-10-CM | POA: Diagnosis not present

## 2018-11-26 DIAGNOSIS — W19XXXA Unspecified fall, initial encounter: Secondary | ICD-10-CM

## 2018-11-26 DIAGNOSIS — Z833 Family history of diabetes mellitus: Secondary | ICD-10-CM | POA: Diagnosis not present

## 2018-11-26 DIAGNOSIS — F909 Attention-deficit hyperactivity disorder, unspecified type: Secondary | ICD-10-CM | POA: Diagnosis not present

## 2018-11-26 DIAGNOSIS — Z7401 Bed confinement status: Secondary | ICD-10-CM | POA: Diagnosis not present

## 2018-11-26 DIAGNOSIS — K219 Gastro-esophageal reflux disease without esophagitis: Secondary | ICD-10-CM | POA: Diagnosis present

## 2018-11-26 DIAGNOSIS — E878 Other disorders of electrolyte and fluid balance, not elsewhere classified: Secondary | ICD-10-CM | POA: Diagnosis not present

## 2018-11-26 DIAGNOSIS — S72009A Fracture of unspecified part of neck of unspecified femur, initial encounter for closed fracture: Secondary | ICD-10-CM | POA: Diagnosis present

## 2018-11-26 DIAGNOSIS — G309 Alzheimer's disease, unspecified: Secondary | ICD-10-CM | POA: Diagnosis not present

## 2018-11-26 DIAGNOSIS — Z20828 Contact with and (suspected) exposure to other viral communicable diseases: Secondary | ICD-10-CM | POA: Diagnosis not present

## 2018-11-26 HISTORY — PX: INTRAMEDULLARY (IM) NAIL INTERTROCHANTERIC: SHX5875

## 2018-11-26 LAB — CBC
HCT: 35.4 % — ABNORMAL LOW (ref 36.0–46.0)
Hemoglobin: 11.4 g/dL — ABNORMAL LOW (ref 12.0–15.0)
MCH: 31.1 pg (ref 26.0–34.0)
MCHC: 32.2 g/dL (ref 30.0–36.0)
MCV: 96.5 fL (ref 80.0–100.0)
Platelets: 303 10*3/uL (ref 150–400)
RBC: 3.67 MIL/uL — ABNORMAL LOW (ref 3.87–5.11)
RDW: 13.7 % (ref 11.5–15.5)
WBC: 12.8 10*3/uL — ABNORMAL HIGH (ref 4.0–10.5)
nRBC: 0 % (ref 0.0–0.2)

## 2018-11-26 LAB — URINALYSIS, ROUTINE W REFLEX MICROSCOPIC
Bilirubin Urine: NEGATIVE
Glucose, UA: NEGATIVE mg/dL
Hgb urine dipstick: NEGATIVE
Ketones, ur: NEGATIVE mg/dL
Nitrite: NEGATIVE
Protein, ur: NEGATIVE mg/dL
Specific Gravity, Urine: 1.02 (ref 1.005–1.030)
pH: 5 (ref 5.0–8.0)

## 2018-11-26 LAB — HIV ANTIBODY (ROUTINE TESTING W REFLEX): HIV Screen 4th Generation wRfx: NONREACTIVE

## 2018-11-26 LAB — SARS CORONAVIRUS 2 BY RT PCR (HOSPITAL ORDER, PERFORMED IN ~~LOC~~ HOSPITAL LAB): SARS Coronavirus 2: NEGATIVE

## 2018-11-26 SURGERY — FIXATION, FRACTURE, INTERTROCHANTERIC, WITH INTRAMEDULLARY ROD
Anesthesia: General | Site: Hip | Laterality: Right

## 2018-11-26 MED ORDER — ESCITALOPRAM OXALATE 10 MG PO TABS
20.0000 mg | ORAL_TABLET | Freq: Every day | ORAL | Status: DC
  Administered 2018-11-27 – 2018-12-01 (×5): 20 mg via ORAL
  Filled 2018-11-26 (×5): qty 2

## 2018-11-26 MED ORDER — MIDAZOLAM HCL 2 MG/2ML IJ SOLN
INTRAMUSCULAR | Status: AC
Start: 2018-11-26 — End: ?
  Filled 2018-11-26: qty 2

## 2018-11-26 MED ORDER — MORPHINE SULFATE (PF) 2 MG/ML IV SOLN: 0.5000 mg | INTRAVENOUS | Status: DC | PRN

## 2018-11-26 MED ORDER — ONDANSETRON HCL 4 MG PO TABS: 4.0000 mg | ORAL_TABLET | Freq: Four times a day (QID) | ORAL | Status: DC | PRN

## 2018-11-26 MED ORDER — CHLORHEXIDINE GLUCONATE 4 % EX LIQD: 60.0000 mL | Freq: Once | CUTANEOUS | Status: DC

## 2018-11-26 MED ORDER — SODIUM CHLORIDE (PF) 0.9 % IJ SOLN
INTRAMUSCULAR | Status: AC
  Filled 2018-11-26: qty 20

## 2018-11-26 MED ORDER — FENTANYL CITRATE (PF) 100 MCG/2ML IJ SOLN: 25.0000 ug | INTRAMUSCULAR | Status: DC | PRN

## 2018-11-26 MED ORDER — ACETAMINOPHEN 325 MG PO TABS
325.0000 mg | ORAL_TABLET | Freq: Four times a day (QID) | ORAL | Status: DC | PRN
  Administered 2018-11-26 – 2018-11-29 (×7): 650 mg via ORAL
  Filled 2018-11-26 (×7): qty 2

## 2018-11-26 MED ORDER — LACTATED RINGERS IV SOLN
INTRAVENOUS | Status: DC | PRN
  Administered 2018-11-26: 13:00:00 via INTRAVENOUS

## 2018-11-26 MED ORDER — ONDANSETRON HCL 4 MG/2ML IJ SOLN
INTRAMUSCULAR | Status: DC | PRN
  Administered 2018-11-26: 4 mg via INTRAVENOUS

## 2018-11-26 MED ORDER — CEFAZOLIN SODIUM-DEXTROSE 2-4 GM/100ML-% IV SOLN
2.0000 g | Freq: Four times a day (QID) | INTRAVENOUS | Status: AC
  Administered 2018-11-26 – 2018-11-27 (×2): 2 g via INTRAVENOUS
  Filled 2018-11-26 (×2): qty 100

## 2018-11-26 MED ORDER — 0.9 % SODIUM CHLORIDE (POUR BTL) OPTIME
TOPICAL | Status: DC | PRN
  Administered 2018-11-26: 1000 mL

## 2018-11-26 MED ORDER — SODIUM CHLORIDE 0.9 % IV SOLN
INTRAVENOUS | Status: DC | PRN
  Administered 2018-11-26: 50 ug/min via INTRAVENOUS

## 2018-11-26 MED ORDER — PHENYLEPHRINE 40 MCG/ML (10ML) SYRINGE FOR IV PUSH (FOR BLOOD PRESSURE SUPPORT)
PREFILLED_SYRINGE | INTRAVENOUS | Status: AC
  Filled 2018-11-26: qty 20

## 2018-11-26 MED ORDER — MORPHINE SULFATE (PF) 2 MG/ML IV SOLN
0.5000 mg | INTRAVENOUS | Status: DC | PRN
  Administered 2018-11-26: 0.5 mg via INTRAVENOUS
  Filled 2018-11-26: qty 1

## 2018-11-26 MED ORDER — ROCURONIUM BROMIDE 10 MG/ML (PF) SYRINGE
PREFILLED_SYRINGE | INTRAVENOUS | Status: AC
  Filled 2018-11-26: qty 10

## 2018-11-26 MED ORDER — BUSPIRONE HCL 10 MG PO TABS
20.0000 mg | ORAL_TABLET | Freq: Every day | ORAL | Status: DC
  Administered 2018-11-27 – 2018-12-01 (×5): 20 mg via ORAL
  Filled 2018-11-26 (×5): qty 2

## 2018-11-26 MED ORDER — MAGNESIUM CITRATE PO SOLN: 1.0000 | Freq: Once | ORAL | Status: DC | PRN

## 2018-11-26 MED ORDER — HYDROCODONE-ACETAMINOPHEN 5-325 MG PO TABS
1.0000 | ORAL_TABLET | ORAL | Status: DC | PRN
  Administered 2018-11-29 – 2018-11-30 (×2): 1 via ORAL
  Filled 2018-11-26 (×2): qty 1

## 2018-11-26 MED ORDER — SUGAMMADEX SODIUM 200 MG/2ML IV SOLN
INTRAVENOUS | Status: DC | PRN
  Administered 2018-11-26: 200 mg via INTRAVENOUS

## 2018-11-26 MED ORDER — PANTOPRAZOLE SODIUM 40 MG PO TBEC
40.0000 mg | DELAYED_RELEASE_TABLET | Freq: Every day | ORAL | Status: DC
  Administered 2018-11-27 – 2018-12-01 (×5): 40 mg via ORAL
  Filled 2018-11-26 (×5): qty 1

## 2018-11-26 MED ORDER — SUGAMMADEX SODIUM 500 MG/5ML IV SOLN
INTRAVENOUS | Status: AC
  Filled 2018-11-26: qty 5

## 2018-11-26 MED ORDER — ENSURE PRE-SURGERY PO LIQD: 296.0000 mL | Freq: Once | ORAL | Status: DC

## 2018-11-26 MED ORDER — IPRATROPIUM-ALBUTEROL 0.5-2.5 (3) MG/3ML IN SOLN: 3.0000 mL | Freq: Four times a day (QID) | RESPIRATORY_TRACT | Status: DC | PRN

## 2018-11-26 MED ORDER — LIDOCAINE 2% (20 MG/ML) 5 ML SYRINGE
INTRAMUSCULAR | Status: DC | PRN
  Administered 2018-11-26: 50 mg via INTRAVENOUS

## 2018-11-26 MED ORDER — AMPHETAMINE-DEXTROAMPHET ER 5 MG PO CP24
15.0000 mg | ORAL_CAPSULE | Freq: Every day | ORAL | Status: DC
  Administered 2018-11-27 – 2018-12-01 (×5): 15 mg via ORAL
  Filled 2018-11-26 (×5): qty 3

## 2018-11-26 MED ORDER — ONDANSETRON HCL 4 MG/2ML IJ SOLN: 4.0000 mg | Freq: Four times a day (QID) | INTRAMUSCULAR | Status: DC | PRN

## 2018-11-26 MED ORDER — SUGAMMADEX SODIUM 200 MG/2ML IV SOLN: INTRAVENOUS | Status: DC | PRN

## 2018-11-26 MED ORDER — SODIUM CHLORIDE 0.9 % IV SOLN
INTRAVENOUS | Status: AC
  Administered 2018-11-26: 03:00:00 via INTRAVENOUS

## 2018-11-26 MED ORDER — DEXAMETHASONE SODIUM PHOSPHATE 10 MG/ML IJ SOLN
INTRAMUSCULAR | Status: DC | PRN
  Administered 2018-11-26: 4 mg via INTRAVENOUS

## 2018-11-26 MED ORDER — ASPIRIN EC 325 MG PO TBEC
325.0000 mg | DELAYED_RELEASE_TABLET | Freq: Every day | ORAL | Status: DC
  Administered 2018-11-27 – 2018-12-01 (×5): 325 mg via ORAL
  Filled 2018-11-26 (×5): qty 1

## 2018-11-26 MED ORDER — PHENOL 1.4 % MT LIQD: 1.0000 | OROMUCOSAL | Status: DC | PRN

## 2018-11-26 MED ORDER — SODIUM CHLORIDE 0.9 % IV SOLN
INTRAVENOUS | Status: DC
  Administered 2018-11-26: 16:00:00 via INTRAVENOUS

## 2018-11-26 MED ORDER — STERILE WATER FOR IRRIGATION IR SOLN
Status: DC | PRN
  Administered 2018-11-26: 1000 mL

## 2018-11-26 MED ORDER — CEFAZOLIN SODIUM-DEXTROSE 2-4 GM/100ML-% IV SOLN
2.0000 g | INTRAVENOUS | Status: AC
  Administered 2018-11-26: 13:00:00 2 g via INTRAVENOUS

## 2018-11-26 MED ORDER — METOCLOPRAMIDE HCL 5 MG/ML IJ SOLN: 5.0000 mg | Freq: Three times a day (TID) | INTRAMUSCULAR | Status: DC | PRN

## 2018-11-26 MED ORDER — POLYETHYLENE GLYCOL 3350 17 G PO PACK: 17.0000 g | PACK | Freq: Every day | ORAL | Status: DC | PRN

## 2018-11-26 MED ORDER — FENTANYL CITRATE (PF) 250 MCG/5ML IJ SOLN
INTRAMUSCULAR | Status: AC
  Filled 2018-11-26: qty 5

## 2018-11-26 MED ORDER — ONDANSETRON HCL 4 MG/2ML IJ SOLN: 4.0000 mg | Freq: Once | INTRAMUSCULAR | Status: DC | PRN

## 2018-11-26 MED ORDER — MIDAZOLAM HCL 5 MG/5ML IJ SOLN
INTRAMUSCULAR | Status: DC | PRN
  Administered 2018-11-26: 1 mg via INTRAVENOUS

## 2018-11-26 MED ORDER — BISACODYL 5 MG PO TBEC: 5.0000 mg | DELAYED_RELEASE_TABLET | Freq: Every day | ORAL | Status: DC | PRN

## 2018-11-26 MED ORDER — PHENYLEPHRINE 40 MCG/ML (10ML) SYRINGE FOR IV PUSH (FOR BLOOD PRESSURE SUPPORT)
PREFILLED_SYRINGE | INTRAVENOUS | Status: DC | PRN
  Administered 2018-11-26 (×2): 80 ug via INTRAVENOUS

## 2018-11-26 MED ORDER — MENTHOL 3 MG MT LOZG: 1.0000 | LOZENGE | OROMUCOSAL | Status: DC | PRN

## 2018-11-26 MED ORDER — PROPOFOL 10 MG/ML IV BOLUS
INTRAVENOUS | Status: DC | PRN
  Administered 2018-11-26: 110 mg via INTRAVENOUS

## 2018-11-26 MED ORDER — DOCUSATE SODIUM 100 MG PO CAPS
100.0000 mg | ORAL_CAPSULE | Freq: Two times a day (BID) | ORAL | Status: DC
  Administered 2018-11-26 – 2018-12-01 (×10): 100 mg via ORAL
  Filled 2018-11-26 (×10): qty 1

## 2018-11-26 MED ORDER — VITAMIN C 500 MG PO TABS
500.0000 mg | ORAL_TABLET | Freq: Every day | ORAL | Status: DC
  Administered 2018-11-27 – 2018-12-01 (×5): 500 mg via ORAL
  Filled 2018-11-26 (×5): qty 1

## 2018-11-26 MED ORDER — LIDOCAINE 2% (20 MG/ML) 5 ML SYRINGE
INTRAMUSCULAR | Status: AC
  Filled 2018-11-26: qty 5

## 2018-11-26 MED ORDER — ROCURONIUM BROMIDE 10 MG/ML (PF) SYRINGE
PREFILLED_SYRINGE | INTRAVENOUS | Status: DC | PRN
  Administered 2018-11-26: 50 mg via INTRAVENOUS

## 2018-11-26 MED ORDER — HYDROCODONE-ACETAMINOPHEN 5-325 MG PO TABS: 1.0000 | ORAL_TABLET | Freq: Four times a day (QID) | ORAL | Status: DC | PRN

## 2018-11-26 MED ORDER — METOCLOPRAMIDE HCL 5 MG PO TABS: 5.0000 mg | ORAL_TABLET | Freq: Three times a day (TID) | ORAL | Status: DC | PRN

## 2018-11-26 MED ORDER — HYDROCODONE-ACETAMINOPHEN 7.5-325 MG PO TABS
1.0000 | ORAL_TABLET | ORAL | Status: DC | PRN
  Administered 2018-11-30: 22:00:00 1 via ORAL
  Filled 2018-11-26: qty 1

## 2018-11-26 MED ORDER — QUETIAPINE FUMARATE 100 MG PO TABS
100.0000 mg | ORAL_TABLET | Freq: Every day | ORAL | Status: DC
  Administered 2018-11-26 – 2018-11-30 (×5): 100 mg via ORAL
  Filled 2018-11-26 (×5): qty 1

## 2018-11-26 MED ORDER — FENTANYL CITRATE (PF) 250 MCG/5ML IJ SOLN
INTRAMUSCULAR | Status: DC | PRN
  Administered 2018-11-26 (×2): 50 ug via INTRAVENOUS

## 2018-11-26 MED ORDER — POVIDONE-IODINE 10 % EX SWAB: 2.0000 "application " | Freq: Once | CUTANEOUS | Status: DC

## 2018-11-26 SURGICAL SUPPLY — 37 items
BIT DRILL CALIBRATED 4.2 (BIT) IMPLANT
BLADE HELICAL TFNA 90 HIP (Anchor) ×1 IMPLANT
BLADE HELICAL TFNA 90MM HIP (Anchor) ×1 IMPLANT
BLADE SURG 15 STRL LF DISP TIS (BLADE) ×1 IMPLANT
BLADE SURG 15 STRL SS (BLADE) ×3
COVER BACK TABLE 60X90IN (DRAPES) ×3 IMPLANT
COVER PERINEAL POST (MISCELLANEOUS) ×3 IMPLANT
COVER SURGICAL LIGHT HANDLE (MISCELLANEOUS) ×6 IMPLANT
COVER WAND RF STERILE (DRAPES) ×3 IMPLANT
DRAPE C-ARM 42X72 X-RAY (DRAPES) ×3 IMPLANT
DRAPE STERI IOBAN 125X83 (DRAPES) ×3 IMPLANT
DRILL BIT CALIBRATED 4.2 (BIT) ×3
DRSG ADAPTIC 3X8 NADH LF (GAUZE/BANDAGES/DRESSINGS) ×3 IMPLANT
DRSG MEPILEX BORDER 4X4 (GAUZE/BANDAGES/DRESSINGS) ×3 IMPLANT
DRSG MEPILEX BORDER 4X8 (GAUZE/BANDAGES/DRESSINGS) ×3 IMPLANT
ELECT REM PT RETURN 9FT ADLT (ELECTROSURGICAL) ×3
ELECTRODE REM PT RTRN 9FT ADLT (ELECTROSURGICAL) ×1 IMPLANT
EVACUATOR 1/8 PVC DRAIN (DRAIN) IMPLANT
GLOVE BIOGEL PI IND STRL 9 (GLOVE) ×1 IMPLANT
GLOVE BIOGEL PI INDICATOR 9 (GLOVE) ×2
GLOVE SURG ORTHO 9.0 STRL STRW (GLOVE) ×3 IMPLANT
GOWN STRL REUS W/ TWL XL LVL3 (GOWN DISPOSABLE) ×3 IMPLANT
GOWN STRL REUS W/TWL XL LVL3 (GOWN DISPOSABLE) ×9
GUIDEWIRE 3.2X400 (WIRE) ×3 IMPLANT
KIT BASIN OR (CUSTOM PROCEDURE TRAY) ×3 IMPLANT
KIT TURNOVER KIT B (KITS) ×3 IMPLANT
MANIFOLD NEPTUNE II (INSTRUMENTS) ×3 IMPLANT
NAIL TROCH FIX 10X170 130 (Nail) ×3 IMPLANT
NS IRRIG 1000ML POUR BTL (IV SOLUTION) ×3 IMPLANT
PACK GENERAL/GYN (CUSTOM PROCEDURE TRAY) ×3 IMPLANT
PAD ARMBOARD 7.5X6 YLW CONV (MISCELLANEOUS) ×6 IMPLANT
SCREW LOCKING 5.0X34MM (Screw) ×2 IMPLANT
STAPLER VISISTAT 35W (STAPLE) ×2 IMPLANT
SUT VIC AB 0 CT1 27 (SUTURE) ×6
SUT VIC AB 0 CT1 27XBRD ANBCTR (SUTURE) IMPLANT
SUT VIC AB 2-0 CTB1 (SUTURE) IMPLANT
WATER STERILE IRR 1000ML POUR (IV SOLUTION) ×6 IMPLANT

## 2018-11-26 NOTE — Plan of Care (Signed)
  Problem: Education: Goal: Knowledge of General Education information will improve Description Including pain rating scale, medication(s)/side effects and non-pharmacologic comfort measures Outcome: Progressing   

## 2018-11-26 NOTE — ED Notes (Signed)
Urine culture sent down with UA. 

## 2018-11-26 NOTE — Transfer of Care (Signed)
Immediate Anesthesia Transfer of Care Note  Patient: Desiree Salinas  Procedure(s) Performed: INTRAMEDULLARY (IM) NAIL INTERTROCHANTRIC RIGHT HIP (Right Hip)  Patient Location: PACU  Anesthesia Type:General  Level of Consciousness: awake, alert  and oriented  Airway & Oxygen Therapy: Patient Spontanous Breathing and Patient connected to nasal cannula oxygen  Post-op Assessment: Report given to RN, Post -op Vital signs reviewed and stable and Patient moving all extremities X 4  Post vital signs: Reviewed and stable  Last Vitals:  Vitals Value Taken Time  BP 149/68 11/26/18 1401  Temp    Pulse 76 11/26/18 1405  Resp 22 11/26/18 1405  SpO2 100 % 11/26/18 1405  Vitals shown include unvalidated device data.  Last Pain:  Vitals:   11/25/18 2256  PainSc: 9          Complications: No apparent anesthesia complications

## 2018-11-26 NOTE — Consult Note (Signed)
ORTHOPAEDIC CONSULTATION  REQUESTING PHYSICIAN: Jonetta Osgood, MD  Chief Complaint: Right hip intertrochanteric fracture.  HPI: Desiree Salinas is a 68 y.o. female who presents with a right hip intertrochanteric hip fracture.  Patient has dementia she was at home she has a sitter patient got up fell sustaining an intertrochanteric hip fracture.  Patient is a resident of [facility].  Past Medical History:  Diagnosis Date  . ADD (attention deficit disorder)   . Allergy   . Anemia   . Anxiety   . Arthritis   . Bruised rib 2020  . Cataract   . Colon polyps    HYPERPLASTIC POLYP  . COPD (chronic obstructive pulmonary disease) ([place])   . Dementia ([place])   . Depression   . Elevated cholesterol   . Shingles   . STD (sexually transmitted disease)    Condylomata  . Substance abuse ([place])    Stopped drinking one year ago.   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 ABDOMINAL HYSTERECTOMY  1999   TAH  . ABDOMINAL SURGERY  1999   Abdominoplasty  . ACHILLES TENDON SURGERY    . AUGMENTATION MAMMAPLASTY     Implants  . COLONOSCOPY  2006  . POLYPECTOMY    . TONSILLECTOMY AND ADENOIDECTOMY    . TUBAL LIGATION     Social History   Socioeconomic History  . Marital status: Divorced    Spouse name: Not on file  . Number of children: 2  . Years of education: College  . Highest education level: Not on file  Occupational History  . Occupation: retired  Scientific laboratory technician  . Financial resource strain: Not on file  . Food insecurity    Worry: Not on file    Inability: Not on file  . Transportation needs    Medical: Not on file    Non-medical: Not on file  Tobacco Use  . Smoking status: Former Smoker    Packs/day: 0.90    Types: Cigarettes  . Smokeless tobacco: Never Used  . Tobacco comment: 10 cigarettes per day as of 04/17/2018. Not smoked since the fall as of 06/11/2018   Substance and Sexual Activity  . Alcohol use: Not Currently    Alcohol/week: 0.0 standard drinks    Comment: none since May 2019  . Drug use: Not Currently    Types: Marijuana  . Sexual activity: Never    Birth control/protection: Post-menopausal, Surgical    Comment: 1st intercourse 68 yo-More than 5 partners  Lifestyle  . Physical activity    Days per week: Not on file    Minutes per session: Not on file  . Stress: Not on file  Relationships  . Social Herbalist on phone: Not on file    Gets together: Not on file    Attends religious service: Not on file    Active member of club or organization: Not on file    Attends meetings of clubs or organizations: Not on file    Relationship status: Not on file  Other Topics Concern  . Not on file  Social History Narrative   Lives: alone, has caregiver there 8:00 AM to 10 PM 7 days per week and 24 hour on call. Daughter also lives there     Family History  Problem Relation Age of Onset  . Breast cancer Mother  Age 26's  . Diabetes Maternal Grandmother   . Aneurysm Maternal Grandmother   . Aneurysm Maternal Aunt   . Aneurysm Maternal Grandfather   . Ankylosing spondylitis Father   . Colon cancer Neg Hx   . Colon polyps Neg Hx   . Esophageal cancer Neg Hx   . Rectal cancer Neg Hx   . Stomach cancer Neg Hx   . Dementia Neg Hx    - negative except otherwise stated in the family history section Allergies  Allergen Reactions  . Latex   . Nickel Hives    Hives, itching, weeping   . Cobalt Rash  . Eucerin [Basis Facial Moisturizer] Hives, Itching and Rash    eucerin lotion causes an allergic skin reaction   Prior to Admission medications   Medication Sig Start Date End Date Taking? Authorizing Provider  acetaminophen (TYLENOL) 325 MG tablet Take 650 mg by mouth 2 (two) times daily.    [provider]  amphetamine-dextroamphetamine (ADDERALL XR) 15 MG 24 hr capsule Take 15 mg by mouth daily.  06/19/18    [provider]  atorvastatin (LIPITOR) 40 MG tablet TAKE 1 BY MOUTH DAILY Patient taking differently: Take 40 mg by mouth daily.  06/23/14   Noralee Space, MD  b complex vitamins capsule Take 1 capsule by mouth daily. 08/20/18   Brunetta Genera, MD  busPIRone (BUSPAR) 10 MG tablet Take 20 mg by mouth at bedtime.     [provider]  escitalopram (LEXAPRO) 20 MG tablet Take 20 mg by mouth daily.    [provider]  pantoprazole (PROTONIX) 40 MG tablet Take 40 mg by mouth daily.  06/12/18   [provider]  pindolol (VISKEN) 10 MG tablet Take 10 mg by mouth daily.     [provider]  QUEtiapine (SEROQUEL) 100 MG tablet Take 100 mg by mouth at bedtime. 10/29/18   [provider]  vitamin C (ASCORBIC ACID) 500 MG tablet Take 500 mg by mouth daily.    [provider]   Dg Chest 1 View  Result Date: 11/25/2018 CLINICAL DATA:  68 year old female with fall and right hip fracture. EXAM: CHEST  1 VIEW COMPARISON:  Chest radiograph dated 11/13/2018 FINDINGS: Mild chronic bronchitic changes. No focal consolidation, pleural effusion, or pneumothorax. The cardiac silhouette is within normal limits. Atherosclerotic calcification of the aortic arch. No acute osseous pathology. IMPRESSION: No focal consolidation. Electronically Signed   By: Anner Crete M.D.   On: 11/25/2018 21:52   Ct Head Wo Contrast  Result Date: 11/26/2018 CLINICAL DATA:  68 year old female with dementia and fall. EXAM: CT HEAD WITHOUT CONTRAST TECHNIQUE: Contiguous axial images were obtained from the base of the skull through the vertex without intravenous contrast. COMPARISON:  Head CT dated 11/13/2018 FINDINGS: Brain: Mild age-related atrophy and chronic microvascular ischemic changes. Bilateral basal ganglia calcifications as well as cortical laminar calcification in the occipital lobes and areas of calcification in the cerebellum noted, similar to prior CT. There is no  acute intracranial hemorrhage. No mass effect or midline shift. No extra-axial fluid collection. Vascular: No hyperdense vessel or unexpected calcification. Skull: Normal. Negative for fracture or focal lesion. Sinuses/Orbits: No acute finding. Other: None IMPRESSION: 1. No acute intracranial hemorrhage. 2. Age-related atrophy and chronic microvascular ischemic changes. Electronically Signed   By: Anner Crete M.D.   On: 11/26/2018 00:09   Dg Hip Unilat W Or Wo Pelvis 2-3 Views Right  Result Date: 11/25/2018 CLINICAL DATA:  68 year old female  with fall and right hip pain. EXAM: DG HIP (WITH OR WITHOUT PELVIS) 2-3V RIGHT COMPARISON:  Right hip radiograph dated 11/13/2018 FINDINGS: There is a displaced and comminuted appearing intertrochanteric fracture of the right femur. The bones are osteopenic. There is no dislocation. The soft tissues are unremarkable. IMPRESSION: Displaced and comminuted intertrochanteric fracture of the right femur. Electronically Signed   By: Anner Crete M.D.   On: 11/25/2018 21:50   - pertinent xrays, CT, MRI studies were reviewed and independently interpreted  Positive ROS: All other systems have been reviewed and were otherwise negative with the exception of those mentioned in the HPI and as above.  Physical Exam: General: Alert, no acute distress Psychiatric: Patient is competent for consent with normal mood and affect Lymphatic: No axillary or cervical lymphadenopathy Cardiovascular: No pedal edema Respiratory: No cyanosis, no use of accessory musculature GI: No organomegaly, abdomen is soft and non-tender    Images:  @ENCIMAGES @  Labs:  Lab Results  Component Value Date   REPTSTATUS 11/15/2018 FINAL 11/13/2018   CULT  11/13/2018    NO GROWTH Performed at [facility] [address], [place], [place] [address]    LABORGA NO GROWTH 03/14/2015    Lab Results  Component Value Date   ALBUMIN 3.5 11/25/2018   ALBUMIN 3.5 11/13/2018    ALBUMIN 4.1 11/07/2018    Neurologic: Patient does not have protective sensation bilateral lower extremities.   MUSCULOSKELETAL:   Skin: Examination the skin is intact.  The right lower extremity is shortened and externally rotated.  Patient has a palpable dorsalis pedis pulse she does have swelling.  Radiographs shows a comminuted intertrochanteric hip fracture.  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  Plan: Plan: We will plan for a trochanteric nail for stabilization.  I discussed the case with her sister who is the power of attorney over the phone.  Discussed the risks and benefits including infection neurovascular injury persistent pain need for additional surgery.  Patient sister states he understands wished to proceed at this time anticipate patient will be able to return back to [place].  Thank you for the consult and the opportunity to see Ms. [name], [place] [phone] 10:44 AM

## 2018-11-26 NOTE — ED Notes (Signed)
Both hospitalist and [facility] at bedside for rounding. Both informed of consent needing to be signed and advised to please call sister for verbal consent.

## 2018-11-26 NOTE — Progress Notes (Signed)
PROGRESS NOTE        PATIENT DETAILS Name: Desiree Salinas Age: 68 y.o. Sex: female Date of Birth: Sep 22, 1950 Admit Date: 11/25/2018 Admitting Physician Shela Leff, MD [id number], Nicki Reaper, MD  Brief Narrative: Patient is a 68 y.o. female with history of dementia, COPD, depression, dyslipidemia-presented to the ED after sustaining a witnessed fall at SNF, she was found to have a right hip fracture.  Subjective: Pleasantly confused-unable to obtain any history.  Assessment/Plan: Right hip fracture: Following a mechanical fall-patient is s/p IM nail placement on 9/23, await further recommendations from orthopedics.  Acute urinary retention: In and out catheterization one time this morning in the emergency room-if reoccurs-we will need Foley catheter placement.  Follow closely  COPD: Not in exacerbation-lungs clear-continue bronchodilators as needed  GERD: Continue PPI  Dementia: at risk of delirium during this hospital stay-resume Seroquel, Lexapro  Diet: Diet Order            Diet regular Room service appropriate? No; Fluid consistency: Thin  Diet effective now               DVT Prophylaxis: SCD's + ASA  Code Status: Full code  Family Communication: None at bedside-we will reach out to family over the next few days   Disposition Plan: Remain inpatient-but will plan SNF on discharge-over the next few days  Antimicrobial agents: Anti-infectives (From admission, onward)   Start     Dose/Rate Route Frequency Ordered Stop   11/26/18 2200  ceFAZolin (ANCEF) IVPB 2g/100 mL premix     2 g 200 mL/hr over 30 Minutes Intravenous Every 6 hours 11/26/18 1524 11/27/18 0959   11/26/18 1130  ceFAZolin (ANCEF) IVPB 2g/100 mL premix     2 g 200 mL/hr over 30 Minutes Intravenous To Surgery 11/26/18 1110 11/26/18 1314      Procedures: 9/23>>INTRAMEDULLARY (IM) NAIL INTERTROCHANTRIC RIGHT HIP  CONSULTS:  orthopedic surgery  Time spent: 25  minutes-Greater than 50% of this time was spent in counseling, explanation of diagnosis, planning of further management, and coordination of care.  MEDICATIONS: Scheduled Meds:  [START ON 11/27/2018] aspirin EC  325 mg Oral Q breakfast   docusate sodium  100 mg Oral BID   Continuous Infusions:  sodium chloride 75 mL/hr at 11/26/18 1530    ceFAZolin (ANCEF) IV     PRN Meds:.[START ON 11/27/2018] acetaminophen, bisacodyl, HYDROcodone-acetaminophen, HYDROcodone-acetaminophen, ipratropium-albuterol, magnesium citrate, menthol-cetylpyridinium **OR** phenol, metoCLOPramide **OR** metoCLOPramide (REGLAN) injection, morphine injection, ondansetron **OR** ondansetron (ZOFRAN) IV, polyethylene glycol   PHYSICAL EXAM: Vital signs: Vitals:   11/26/18 1400 11/26/18 1415 11/26/18 1430 11/26/18 1454  BP: (!) 149/68 134/66 118/62 (!) 126/99  Pulse: 77 77 76 90  Resp: 20 20 19 14   Temp: 97.6 F (36.4 C)   99.7 F (37.6 C)  TempSrc:    Oral  SpO2: 100% 100% 100% 93%   There were no vitals filed for this visit. There is no height or weight on file to calculate BMI.   Gen Exam: Pleasantly confused-not in any distress HEENT:atraumatic, normocephalic Chest: B/L clear to auscultation anteriorly CVS:S1S2 regular Abdomen:soft non tender, non distended Extremities:no edema Neurology: Non focal Skin: no rash  I have personally reviewed following labs and imaging studies  LABORATORY DATA: CBC: Recent Labs  Lab 11/25/18 2155 11/26/18 0257  WBC 12.7* 12.8*  NEUTROABS 10.4*  --   HGB 12.3  11.4*  HCT 37.1 35.4*  MCV 95.9 96.5  PLT 317 [id number]    Basic Metabolic Panel: Recent Labs  Lab 11/25/18 2155  NA 142  K 4.1  CL 112*  CO2 22  GLUCOSE 128*  BUN 10  CREATININE 0.86  CALCIUM 8.4*    GFR: Estimated Creatinine Clearance: 57.1 mL/min (by C-G formula based on SCr of 0.86 mg/dL).  Liver Function Tests: Recent Labs  Lab 11/25/18 2155  AST 21  ALT 17  ALKPHOS 79  BILITOT 0.2*    PROT 6.0*  ALBUMIN 3.5   No results for input(s): LIPASE, AMYLASE in the last 168 hours. No results for input(s): AMMONIA in the last 168 hours.  Coagulation Profile: No results for input(s): INR, PROTIME in the last 168 hours.  Cardiac Enzymes: No results for input(s): CKTOTAL, CKMB, CKMBINDEX, TROPONINI in the last 168 hours.  BNP (last 3 results) No results for input(s): PROBNP in the last 8760 hours.  HbA1C: No results for input(s): HGBA1C in the last 72 hours.  CBG: No results for input(s): GLUCAP in the last 168 hours.  Lipid Profile: No results for input(s): CHOL, HDL, LDLCALC, TRIG, CHOLHDL, LDLDIRECT in the last 72 hours.  Thyroid Function Tests: No results for input(s): TSH, T4TOTAL, FREET4, T3FREE, THYROIDAB in the last 72 hours.  Anemia Panel: No results for input(s): VITAMINB12, FOLATE, FERRITIN, TIBC, IRON, RETICCTPCT in the last 72 hours.  Urine analysis:    Component Value Date/Time   COLORURINE YELLOW 11/26/2018 0812   APPEARANCEUR HAZY (A) 11/26/2018 0812   APPEARANCEUR Clear 06/11/2018 1120   LABSPEC 1.020 11/26/2018 0812   PHURINE 5.0 11/26/2018 0812   GLUCOSEU NEGATIVE 11/26/2018 0812   GLUCOSEU NEGATIVE 08/14/2011 0815   HGBUR NEGATIVE 11/26/2018 0812   BILIRUBINUR NEGATIVE 11/26/2018 0812   BILIRUBINUR Negative 06/11/2018 1120   KETONESUR NEGATIVE 11/26/2018 0812   PROTEINUR NEGATIVE 11/26/2018 0812   UROBILINOGEN 0.2 12/30/2013 1248   NITRITE NEGATIVE 11/26/2018 0812   LEUKOCYTESUR TRACE (A) 11/26/2018 0812    Sepsis Labs: Lactic Acid, Venous No results found for: LATICACIDVEN  MICROBIOLOGY: Recent Results (from the past 240 hour(s))  SARS Coronavirus 2 [facility] order, Performed in [facility] hospital lab) Nasopharyngeal Nasopharyngeal Swab     Status: None   Collection Time: 11/18/18 10:31 AM   Specimen: Nasopharyngeal Swab  Result Value Ref Range Status   SARS Coronavirus 2 NEGATIVE NEGATIVE Final    Comment: (NOTE) If result  is NEGATIVE SARS-CoV-2 target nucleic acids are NOT DETECTED. The SARS-CoV-2 RNA is generally detectable in upper and lower  respiratory specimens during the acute phase of infection. The lowest  concentration of SARS-CoV-2 viral copies this assay can detect is 250  copies / mL. A negative result does not preclude SARS-CoV-2 infection  and should not be used as the sole basis for treatment or other  patient management decisions.  A negative result may occur with  improper specimen collection / handling, submission of specimen other  than nasopharyngeal swab, presence of viral mutation(s) within the  areas targeted by this assay, and inadequate number of viral copies  (<250 copies / mL). A negative result must be combined with clinical  observations, patient history, and epidemiological information. If result is POSITIVE SARS-CoV-2 target nucleic acids are DETECTED. The SARS-CoV-2 RNA is generally detectable in upper and lower  respiratory specimens dur ing the acute phase of infection.  Positive  results are indicative of active infection with SARS-CoV-2.  Clinical  correlation with patient history  and other diagnostic information is  necessary to determine patient infection status.  Positive results do  not rule out bacterial infection or co-infection with other viruses. If result is PRESUMPTIVE POSTIVE SARS-CoV-2 nucleic acids MAY BE PRESENT.   A presumptive positive result was obtained on the submitted specimen  and confirmed on repeat testing.  While 2019 novel coronavirus  (SARS-CoV-2) nucleic acids may be present in the submitted sample  additional confirmatory testing may be necessary for epidemiological  and / or clinical management purposes  to differentiate between  SARS-CoV-2 and other Sarbecovirus currently known to infect humans.  If clinically indicated additional testing with an alternate test  methodology [phone]) is advised. The SARS-CoV-2 RNA is generally    detectable in upper and lower respiratory sp ecimens during the acute  phase of infection. The expected result is Negative. Fact Sheet for Patients:  StrictlyIdeas.no Fact Sheet for Healthcare Providers: BankingDealers.co.za This test is not yet approved or cleared by the [place] FDA and has been authorized for detection and/or diagnosis of SARS-CoV-2 by FDA under an Emergency Use Authorization (EUA).  This EUA will remain in effect (meaning this test can be used) for the duration of the COVID-19 declaration under Section 564(b)(1) of the Act, 21 U.S.C. section 360bbb-3(b)(1), unless the authorization is terminated or revoked sooner. Performed at [facility], [place] [address], [place], [place] [address]   SARS Coronavirus 2 [facility] order, Performed in [facility] hospital lab) Nasopharyngeal Nasopharyngeal Swab     Status: None   Collection Time: 11/25/18 11:02 PM   Specimen: Nasopharyngeal Swab  Result Value Ref Range Status   SARS Coronavirus 2 NEGATIVE NEGATIVE Final    Comment: (NOTE) If result is NEGATIVE SARS-CoV-2 target nucleic acids are NOT DETECTED. The SARS-CoV-2 RNA is generally detectable in upper and lower  respiratory specimens during the acute phase of infection. The lowest  concentration of SARS-CoV-2 viral copies this assay can detect is 250  copies / mL. A negative result does not preclude SARS-CoV-2 infection  and should not be used as the sole basis for treatment or other  patient management decisions.  A negative result may occur with  improper specimen collection / handling, submission of specimen other  than nasopharyngeal swab, presence of viral mutation(s) within the  areas targeted by this assay, and inadequate number of viral copies  (<250 copies / mL). A negative result must be combined with clinical  observations, patient history, and epidemiological information. If result is  POSITIVE SARS-CoV-2 target nucleic acids are DETECTED. The SARS-CoV-2 RNA is generally detectable in upper and lower  respiratory specimens dur ing the acute phase of infection.  Positive  results are indicative of active infection with SARS-CoV-2.  Clinical  correlation with patient history and other diagnostic information is  necessary to determine patient infection status.  Positive results do  not rule out bacterial infection or co-infection with other viruses. If result is PRESUMPTIVE POSTIVE SARS-CoV-2 nucleic acids MAY BE PRESENT.   A presumptive positive result was obtained on the submitted specimen  and confirmed on repeat testing.  While 2019 novel coronavirus  (SARS-CoV-2) nucleic acids may be present in the submitted sample  additional confirmatory testing may be necessary for epidemiological  and / or clinical management purposes  to differentiate between  SARS-CoV-2 and other Sarbecovirus currently known to infect humans.  If clinically indicated additional testing with an alternate test  methodology [phone]) is advised. The SARS-CoV-2 RNA is generally  detectable in upper  and lower respiratory sp ecimens during the acute  phase of infection. The expected result is Negative. Fact Sheet for Patients:  StrictlyIdeas.no Fact Sheet for Healthcare Providers: BankingDealers.co.za This test is not yet approved or cleared by the [place] FDA and has been authorized for detection and/or diagnosis of SARS-CoV-2 by FDA under an Emergency Use Authorization (EUA).  This EUA will remain in effect (meaning this test can be used) for the duration of the COVID-19 declaration under Section 564(b)(1) of the Act, 21 U.S.C. section 360bbb-3(b)(1), unless the authorization is terminated or revoked sooner. Performed at [facility], [place] [address], [place], [place] [address]     RADIOLOGY STUDIES/RESULTS: Dg Chest 1  View  Result Date: 11/25/2018 CLINICAL DATA:  68 year old female with fall and right hip fracture. EXAM: CHEST  1 VIEW COMPARISON:  Chest radiograph dated 11/13/2018 FINDINGS: Mild chronic bronchitic changes. No focal consolidation, pleural effusion, or pneumothorax. The cardiac silhouette is within normal limits. Atherosclerotic calcification of the aortic arch. No acute osseous pathology. IMPRESSION: No focal consolidation. Electronically Signed   By: Anner Crete M.D.   On: 11/25/2018 21:52   Dg Lumbar Spine Complete  Result Date: 11/13/2018 CLINICAL DATA:  Change in mental status EXAM: LUMBAR SPINE - COMPLETE 4+ VIEW COMPARISON:  January 28, 2018 FINDINGS: There is an S-shaped scoliotic curvature of the lumbar spine. No definite fracture is seen. There is diffuse osteopenia. Disc height loss and facet arthrosis most notable at L4-L5 and L5-S1. Scattered aortic atherosclerosis. IMPRESSION: No definite acute fracture. S-shaped scoliotic curvature and diffuse osteopenia. Electronically Signed   By: Prudencio Pair M.D.   On: 11/13/2018 22:53   Ct Head Wo Contrast  Result Date: 11/26/2018 CLINICAL DATA:  68 year old female with dementia and fall. EXAM: CT HEAD WITHOUT CONTRAST TECHNIQUE: Contiguous axial images were obtained from the base of the skull through the vertex without intravenous contrast. COMPARISON:  Head CT dated 11/13/2018 FINDINGS: Brain: Mild age-related atrophy and chronic microvascular ischemic changes. Bilateral basal ganglia calcifications as well as cortical laminar calcification in the occipital lobes and areas of calcification in the cerebellum noted, similar to prior CT. There is no acute intracranial hemorrhage. No mass effect or midline shift. No extra-axial fluid collection. Vascular: No hyperdense vessel or unexpected calcification. Skull: Normal. Negative for fracture or focal lesion. Sinuses/Orbits: No acute finding. Other: None IMPRESSION: 1. No acute intracranial  hemorrhage. 2. Age-related atrophy and chronic microvascular ischemic changes. Electronically Signed   By: Anner Crete M.D.   On: 11/26/2018 00:09   Ct Head Wo Contrast  Result Date: 11/13/2018 CLINICAL DATA:  Altered mental status EXAM: CT HEAD WITHOUT CONTRAST CT CERVICAL SPINE WITHOUT CONTRAST TECHNIQUE: Multidetector CT imaging of the head and cervical spine was performed following the standard protocol without intravenous contrast. Multiplanar CT image reconstructions of the cervical spine were also generated. COMPARISON:  10/30/2018 FINDINGS: CT HEAD FINDINGS Brain: No evidence of acute infarction, hemorrhage, hydrocephalus, extra-axial collection or mass lesion/mass effect. Periventricular white matter hypodensity. Calcification of the basal ganglia and cortical laminar calcification of the occipital lobes bilaterally. Vascular: No hyperdense vessel or unexpected calcification. Skull: Normal. Negative for fracture or focal lesion. Sinuses/Orbits: No acute finding. Other: None. CT CERVICAL SPINE FINDINGS Alignment: Degenerative straightening of the normal cervical lordosis. Skull base and vertebrae: No acute fracture. No primary bone lesion or focal pathologic process. Soft tissues and spinal canal: No prevertebral fluid or swelling. No visible canal hematoma. Disc levels: Severe multilevel disc degenerative and osteophytosis from  C4 through C7. Upper chest: Negative. Other: None. IMPRESSION: 1. No acute intracranial pathology. Chronic parenchymal calcifications and small-vessel white matter disease. 2.  No fracture or static subluxation of the cervical spine. Electronically Signed   By: Eddie Candle M.D.   On: 11/13/2018 18:15   Ct Head Wo Contrast  Result Date: 10/30/2018 CLINICAL DATA:  Witnessed fall today with abrasions to the left side of the head. EXAM: CT HEAD WITHOUT CONTRAST CT CERVICAL SPINE WITHOUT CONTRAST TECHNIQUE: Multidetector CT imaging of the head and cervical spine was  performed following the standard protocol without intravenous contrast. Multiplanar CT image reconstructions of the cervical spine were also generated. COMPARISON:  06/09/2018 FINDINGS: CT HEAD FINDINGS Brain: No acute finding. Chronic calcification within the cerebellum, thalami and basal ganglia. Chronic small-vessel changes of the white matter. No mass lesion, hemorrhage, hydrocephalus or extra-axial collection. Vascular: There is atherosclerotic calcification of the major vessels at the base of the brain. Skull: No skull fracture. Sinuses/Orbits: Clear/normal Other: None CT CERVICAL SPINE FINDINGS Alignment: No traumatic malalignment. Straightening and slight kyphotic curvature of the cervical spine. Skull base and vertebrae: No fracture or primary bone lesion. Soft tissues and spinal canal: No soft tissue swelling. Disc levels: Chronic degenerative spondylosis from C3-4 through C6-7. Facet arthropathy at C2-3 and C3-4. Upper chest: Negative Other: None IMPRESSION: Head CT: No acute or traumatic finding. Chronic physiologic calcifications. Chronic small-vessel change of the white matter. Cervical spine CT: No acute or traumatic finding. Chronic degenerative spondylosis and facet arthropathy. Electronically Signed   By: Nelson Chimes M.D.   On: 10/30/2018 21:15   Ct Cervical Spine Wo Contrast  Result Date: 11/13/2018 CLINICAL DATA:  Altered mental status EXAM: CT HEAD WITHOUT CONTRAST CT CERVICAL SPINE WITHOUT CONTRAST TECHNIQUE: Multidetector CT imaging of the head and cervical spine was performed following the standard protocol without intravenous contrast. Multiplanar CT image reconstructions of the cervical spine were also generated. COMPARISON:  10/30/2018 FINDINGS: CT HEAD FINDINGS Brain: No evidence of acute infarction, hemorrhage, hydrocephalus, extra-axial collection or mass lesion/mass effect. Periventricular white matter hypodensity. Calcification of the basal ganglia and cortical laminar  calcification of the occipital lobes bilaterally. Vascular: No hyperdense vessel or unexpected calcification. Skull: Normal. Negative for fracture or focal lesion. Sinuses/Orbits: No acute finding. Other: None. CT CERVICAL SPINE FINDINGS Alignment: Degenerative straightening of the normal cervical lordosis. Skull base and vertebrae: No acute fracture. No primary bone lesion or focal pathologic process. Soft tissues and spinal canal: No prevertebral fluid or swelling. No visible canal hematoma. Disc levels: Severe multilevel disc degenerative and osteophytosis from C4 through C7. Upper chest: Negative. Other: None. IMPRESSION: 1. No acute intracranial pathology. Chronic parenchymal calcifications and small-vessel white matter disease. 2.  No fracture or static subluxation of the cervical spine. Electronically Signed   By: Eddie Candle M.D.   On: 11/13/2018 18:15   Ct Cervical Spine Wo Contrast  Result Date: 10/30/2018 CLINICAL DATA:  Witnessed fall today with abrasions to the left side of the head. EXAM: CT HEAD WITHOUT CONTRAST CT CERVICAL SPINE WITHOUT CONTRAST TECHNIQUE: Multidetector CT imaging of the head and cervical spine was performed following the standard protocol without intravenous contrast. Multiplanar CT image reconstructions of the cervical spine were also generated. COMPARISON:  06/09/2018 FINDINGS: CT HEAD FINDINGS Brain: No acute finding. Chronic calcification within the cerebellum, thalami and basal ganglia. Chronic small-vessel changes of the white matter. No mass lesion, hemorrhage, hydrocephalus or extra-axial collection. Vascular: There is atherosclerotic calcification of the major vessels at the  base of the brain. Skull: No skull fracture. Sinuses/Orbits: Clear/normal Other: None CT CERVICAL SPINE FINDINGS Alignment: No traumatic malalignment. Straightening and slight kyphotic curvature of the cervical spine. Skull base and vertebrae: No fracture or primary bone lesion. Soft tissues and  spinal canal: No soft tissue swelling. Disc levels: Chronic degenerative spondylosis from C3-4 through C6-7. Facet arthropathy at C2-3 and C3-4. Upper chest: Negative Other: None IMPRESSION: Head CT: No acute or traumatic finding. Chronic physiologic calcifications. Chronic small-vessel change of the white matter. Cervical spine CT: No acute or traumatic finding. Chronic degenerative spondylosis and facet arthropathy. Electronically Signed   By: Nelson Chimes M.D.   On: 10/30/2018 21:15   Dg Chest Port 1 View  Result Date: 11/13/2018 CLINICAL DATA:  Confusion EXAM: PORTABLE CHEST 1 VIEW COMPARISON:  June 05, 2018 FINDINGS: There are multiple small calcified granulomas in the right lung. There is no edema or consolidation. Heart size and pulmonary vascularity are normal. No adenopathy. There is aortic atherosclerosis. No bone lesions. IMPRESSION: Calcified granulomas in right lung. No edema or consolidation. Heart size normal. Aortic Atherosclerosis (ICD10-I70.0). Electronically Signed   By: Lowella Grip III M.D.   On: 11/13/2018 15:57   Dg C-arm 1-60 Min-no Report  Result Date: 11/26/2018 Fluoroscopy was utilized by the requesting physician.  No radiographic interpretation.   Dg Hip Unilat W Or Wo Pelvis 2-3 Views Right  Result Date: 11/25/2018 CLINICAL DATA:  68 year old female with fall and right hip pain. EXAM: DG HIP (WITH OR WITHOUT PELVIS) 2-3V RIGHT COMPARISON:  Right hip radiograph dated 11/13/2018 FINDINGS: There is a displaced and comminuted appearing intertrochanteric fracture of the right femur. The bones are osteopenic. There is no dislocation. The soft tissues are unremarkable. IMPRESSION: Displaced and comminuted intertrochanteric fracture of the right femur. Electronically Signed   By: Anner Crete M.D.   On: 11/25/2018 21:50   Dg Hips Bilat With Pelvis 3-4 Views  Result Date: 11/13/2018 CLINICAL DATA:  Change in mental status EXAM: DG HIP (WITH OR WITHOUT PELVIS) 3-4V BILAT  COMPARISON:  January 28, 2018 FINDINGS: There is no evidence of hip fracture or dislocation. Bilateral hip osteoarthritis is seen with superior joint space loss. Calcification along the insertion site of the gluteal tendons at the left hip. IMPRESSION: No acute osseous abnormality. Electronically Signed   By: Prudencio Pair M.D.   On: 11/13/2018 22:53     LOS: 0 days   Oren Binet, MD  [facility]  If 7PM-7AM, please contact night-coverage  Please page via [email or web address]  Go to [email or web address] and use [facility] universal password to access. If you do not have the password, please contact the hospital operator.  Locate the [facility] provider you are looking for under [facility] and page to a number that you can be directly reached. If you still have difficulty reaching the provider, please page the [facility] (Director on Call) for the Hospitalists listed on amion for assistance.  11/26/2018, 3:57 PM

## 2018-11-26 NOTE — Anesthesia Procedure Notes (Signed)
Procedure Name: Intubation Date/Time: 11/26/2018 1:07 PM Performed by: Mariea Clonts, CRNA Pre-anesthesia Checklist: Patient identified, Emergency Drugs available, Suction available and Patient being monitored Patient Re-evaluated:Patient Re-evaluated prior to induction Oxygen Delivery Method: Circle System Utilized Preoxygenation: Pre-oxygenation with 100% oxygen Induction Type: IV induction Ventilation: Mask ventilation without difficulty Laryngoscope Size: Mac and 3 Grade View: Grade I Tube type: Oral Tube size: 7.0 mm Number of attempts: 1 Airway Equipment and Method: Stylet and Oral airway Placement Confirmation: ETT inserted through vocal cords under direct vision,  positive ETCO2 and breath sounds checked- equal and bilateral Tube secured with: Tape Dental Injury: Teeth and Oropharynx as per pre-operative assessment

## 2018-11-26 NOTE — ED Notes (Signed)
Dr. Sharol Given at bedside explaining surgical plan to patient sitter and [name] by phone-patient daughter. Witnessed verbal consent for surgical procedure scheduled for today

## 2018-11-26 NOTE — Progress Notes (Addendum)
Pt remains drowsy. Is aroused by pain but difficult to maintain alert loc. Oxygen sats 90-91% on room air. Respirations even and unlabored. Full assessment noted. Neuro checks and VS WNL Notified Dr. Sharol Given and Sloan Leiter of pt condition. Daughter at bedside and states pt has h/o delayed recovery post anesthesia. Also states anesthesia triggers combative behavior thereafter. MD instructed to check VS and neuro checks frequently. Rapid nurse responded to bedside. Assessment and monitoring continues at pt bedside.

## 2018-11-26 NOTE — Progress Notes (Signed)
CSW discharged patient on 9/18 to [facility] in their memory care unit after two weeks in the [facility] ED awaiting placement. Patient had a witness fall at the facility and has a right hip fracture and will be receive surgery at some point in the near future per MD notes. Patient's sister [name] is her POA and she resides in [place] but is reachable by phone.  Madilyn Fireman, MSW, LCSW-A Clinical Social Worker   Transitions of [facility]   Medical ICU [phone]

## 2018-11-26 NOTE — Anesthesia Postprocedure Evaluation (Signed)
Anesthesia Post Note  Patient: Desiree Salinas  Procedure(s) Performed: INTRAMEDULLARY (IM) NAIL INTERTROCHANTRIC RIGHT HIP (Right Hip)     Patient location during evaluation: PACU Anesthesia Type: General Level of consciousness: confused Pain management: pain level controlled Vital Signs Assessment: post-procedure vital signs reviewed and stable Respiratory status: spontaneous breathing, nonlabored ventilation, respiratory function stable and patient connected to nasal cannula oxygen Cardiovascular status: blood pressure returned to baseline and stable Postop Assessment: no apparent nausea or vomiting Anesthetic complications: no    Last Vitals:  Vitals:   11/26/18 1430 11/26/18 1454  BP: 118/62 (!) 126/99  Pulse: 76 90  Resp: 19 14  Temp:  37.6 C  SpO2: 100% 93%    Last Pain:  Vitals:   11/26/18 1654  TempSrc:   PainSc: 0-No pain                 Amica Harron COKER

## 2018-11-26 NOTE — Significant Event (Signed)
Rapid Response Event Note  Overview: Neurologic - AMS/Decrease LOC - Post Op Surgery  Initial Focused Assessment: Called by nursing staff with concerns of patient not waking up after having surgery today (s/p - LT HIP HX). Patient came to 5N07 around 1530 from PACU. Patient did undergo general anesthesia and received Fentanyl in the PACU. 98% on 2L currently, HR 90s SR, BP stable. Skin warm and dry. Patient did wake to noxious stimuli, localizes from all extremities equally, per nurse pupils 3 mm brisk/reactive. Lung sounds - clear in all fields, good air movement, RR 16-22, normal - even breathing. Not in acute distress.  Nurse had already updated TRH MD and Ortho MD.   Interventions: -- none --  Plan of Care: -- RN informed that per MD will watch closely for next 1-2 hours, if patient does not awake more, might need ABG  -- Will hold off on Narcan now because respiratory status is stable.  -- With the patient's significant history of dementia and history of having trouble with waking from general anesthesia, monitor closely  -- If needed, I have asked that a PCU be available.  Event Summary:  Call Time [place], [place]

## 2018-11-26 NOTE — Op Note (Signed)
DATE OF SURGERY:  11/26/2018  TIME: 2:19 PM  PATIENT NAME:  Desiree Salinas     AGE: 68 y.o.  PRE-OPERATIVE DIAGNOSIS:  Right Intertrochanteric Hip Fracture  POST-OPERATIVE DIAGNOSIS:  SAME  PROCEDURE:  INTRAMEDULLARY (IM) NAIL INTERTROCHANTRIC RIGHT HIP  SURGEON:  Newt Minion  ASSISTANT:  none.  OPERATIVE IMPLANTS: Synthes trochanteric femoral nail with interlocking helical blade, locked proximally and distally.  170 mm nail, 90 mm blade, 34 mm screw  PREOPERATIVE INDICATIONS:  [name] is a 68 y.o. year old who fell and suffered a hip fracture. She was brought into the ER and then admitted and optimized and then elected for surgical intervention.    The risks benefits and alternatives were discussed with the patient including but not limited to the risks of nonoperative treatment, versus surgical intervention including infection, bleeding, nerve injury, malunion, nonunion, hardware prominence, hardware failure, need for hardware removal, blood clots, cardiopulmonary complications, morbidity, mortality, among others, and they were willing to proceed.    OPERATIVE PROCEDURE:  The patient was brought to the operating room and placed in the supine position on the [place] fracture table. General anesthesia was administered. She was placed on the fracture table.  Closed reduction was performed under C-arm guidance. Time out was then performed after sterile prep with Duraprep and draped into a sterile field with the shower curtain. She received preoperative antibiotics.  Incision was made proximal to the greater trochanter. A guidewire was placed centered on the greater trochanter. Confirmation was made on AP and lateral views. The femoral canal was then reamed over the wire. Wire and reamer were removed and the nail was inserted.  A guidepin was placed through the jig into the femoral head close to the center center position of the femoral head. The helical screw length was measured the  cortex was reamed and the helical screw was inserted. C-arm was used to verify position of the spiral blade.  The compression screw proximally lock the spiral blade.  The distal interlock was placed using the jig.  Instruments were removed and final C-arm pictures AP and lateral were obtained.  The wounds were irrigated  and closed with Vicryl followed by staples and Mepilex dressing. The patient was awakened and returned to PACU in stable and satisfactory condition. There no complications. Weightbearing as tolerated.  Newt Minion

## 2018-11-26 NOTE — Anesthesia Preprocedure Evaluation (Signed)
Anesthesia Evaluation  Patient identified by MRN, date of birth, ID band Patient confused    Reviewed: Patient's Chart, lab work & pertinent test results, Unable to perform ROS - Chart review only  Airway      Mouth opening: Limited Mouth Opening  Dental  (+) Teeth Intact   Pulmonary former smoker,    breath sounds clear to auscultation       Cardiovascular  Rhythm:Regular Rate:Normal     Neuro/Psych    GI/Hepatic   Endo/Other    Renal/GU      Musculoskeletal   Abdominal   Peds  Hematology   Anesthesia Other Findings   Reproductive/Obstetrics                             Anesthesia Physical Anesthesia Plan  ASA: III  Anesthesia Plan: General   Post-op Pain Management:    Induction: Intravenous  PONV Risk Score and Plan: Ondansetron and Dexamethasone  Airway Management Planned: Oral ETT  Additional Equipment:   Intra-op Plan:   Post-operative Plan: Extubation in OR  Informed Consent: I have reviewed the patients History and Physical, chart, labs and discussed the procedure including the risks, benefits and alternatives for the proposed anesthesia with the patient or authorized representative who has indicated his/her understanding and acceptance.       Plan Discussed with: CRNA and Anesthesiologist  Anesthesia Plan Comments:         Anesthesia Quick Evaluation

## 2018-11-26 NOTE — Progress Notes (Signed)
Patient ID: Desiree Salinas, female   DOB: [birth year], 68 y.o.   MRN: [id number] Dr. Sharol Given will see today for surgical treatment of hip fracture.

## 2018-11-26 NOTE — H&P (Signed)
History and Physical    Desiree Salinas [id number] DOB: January 09, 1951 DOA: 11/25/2018  PCP: [name], MD Patient coming from: Skilled nursing facility  Chief Complaint: Fall, right hip pain  HPI: Desiree Salinas is a 68 y.o. female with medical history significant of dementia, anemia, anxiety, arthritis, COPD, depression, hyperlipidemia presenting to the hospital via EMS from her skilled nursing facility for evaluation of right hip pain after a witnessed fall.  Patient has dementia and no history could be obtained from her.  She is oriented to self only and has no complaints.  Does not recall having a fall.  ED Course: No temperature recorded.  Systolic in the 0000000.  White count 12.7.  SARS-CoV-2 test negative.  Chest x-ray showing no focal consolidation.  CT head negative for acute finding.  X-ray of right hip and pelvis showing displaced and comminuted intertrochanteric fracture of the right femur.  Review of Systems:  All systems reviewed and apart from history of presenting illness, are negative.  Past Medical History:  Diagnosis Date  . ADD (attention deficit disorder)   . Allergy   . Anemia   . Anxiety   . Arthritis   . Bruised rib 2020  . Cataract   . Colon polyps    HYPERPLASTIC POLYP  . COPD (chronic obstructive pulmonary disease) ([place])   . Dementia ([place])   . Depression   . Elevated cholesterol   . Shingles   . STD (sexually transmitted disease)    Condylomata  . Substance abuse ([place])    Stopped drinking one year ago.   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 ABDOMINAL HYSTERECTOMY  1999   TAH  . ABDOMINAL SURGERY  1999   Abdominoplasty  . ACHILLES TENDON SURGERY    . AUGMENTATION MAMMAPLASTY     Implants  . COLONOSCOPY  2006  . POLYPECTOMY    . TONSILLECTOMY AND ADENOIDECTOMY    . TUBAL LIGATION       reports that she has quit smoking. Her smoking use  included cigarettes. She smoked 0.90 packs per day. She has never used smokeless tobacco. She reports previous alcohol use. She reports previous drug use. Drug: Marijuana.  Allergies  Allergen Reactions  . Eucerin [Basis Facial Moisturizer]     eucerin lotion causes an allergic skin reaction  . Latex   . Nickel Hives    Hives, itching, weeping   . Cobalt Rash    Family History  Problem Relation Age of Onset  . Breast cancer Mother        Age 6's  . Diabetes Maternal Grandmother   . Aneurysm Maternal Grandmother   . Aneurysm Maternal Aunt   . Aneurysm Maternal Grandfather   . Ankylosing spondylitis Father   . Colon cancer Neg Hx   . Colon polyps Neg Hx   . Esophageal cancer Neg Hx   . Rectal cancer Neg Hx   . Stomach cancer Neg Hx   . Dementia Neg Hx     Prior to Admission medications   Medication Sig Start Date End Date Taking? Authorizing Provider  acetaminophen (TYLENOL) 325 MG tablet Take 650 mg by mouth 2 (two) times daily.    [provider]  amphetamine-dextroamphetamine (ADDERALL XR) 15 MG 24 hr capsule Take 15 mg by mouth daily.  06/19/18   [provider]  atorvastatin (LIPITOR) 40 MG tablet TAKE 1  BY MOUTH DAILY Patient taking differently: Take 40 mg by mouth daily.  06/23/14   Noralee Space, MD  b complex vitamins capsule Take 1 capsule by mouth daily. 08/20/18   Brunetta Genera, MD  busPIRone (BUSPAR) 10 MG tablet Take 20 mg by mouth at bedtime.     [provider]  escitalopram (LEXAPRO) 20 MG tablet Take 20 mg by mouth daily.    [provider]  pantoprazole (PROTONIX) 40 MG tablet Take 40 mg by mouth daily.  06/12/18   [provider]  pindolol (VISKEN) 10 MG tablet Take 10 mg by mouth daily.     [provider]  QUEtiapine (SEROQUEL) 100 MG tablet Take 100 mg by mouth at bedtime. 10/29/18   [provider]  vitamin C (ASCORBIC ACID) 500 MG tablet Take 500 mg by mouth daily.    [provider]    Physical Exam: Vitals:   11/25/18 2115 11/25/18 2236 11/25/18 2256 11/25/18 2324  BP: 94/81 (!) 142/93 (!) 142/93 130/76  Pulse:  87 93   Resp:  17 16   SpO2:  94% 100%     Physical Exam  Constitutional: She is oriented to person, place, and time. She appears well-developed and well-nourished. No distress.  HENT:  Head: Normocephalic.  Dry mucous membranes  Eyes: Right eye exhibits no discharge. Left eye exhibits no discharge.  Neck: Neck supple.  Cardiovascular: Normal rate, regular rhythm and intact distal pulses.  Pulmonary/Chest: Effort normal and breath sounds normal. No respiratory distress. She has no wheezes. She has no rales.  Abdominal: Soft. Bowel sounds are normal. She exhibits no distension. There is no abdominal tenderness. There is no guarding.  Musculoskeletal:        General: No edema.     Comments: Right lower extremity shortened and externally rotated Dorsalis pedis pulse intact bilaterally  Neurological: She is alert and oriented to person, place, and time.  Skin: Skin is warm and dry. She is not diaphoretic.     Labs on Admission: I have personally reviewed following labs and imaging studies  CBC: Recent Labs  Lab 11/25/18 2155  WBC 12.7*  NEUTROABS 10.4*  HGB 12.3  HCT 37.1  MCV 95.9  PLT [id number]   Basic Metabolic Panel: Recent Labs  Lab 11/25/18 2155  NA 142  K 4.1  CL 112*  CO2 22  GLUCOSE 128*  BUN 10  CREATININE 0.86  CALCIUM 8.4*   GFR: Estimated Creatinine Clearance: 57.1 mL/min (by C-G formula based on SCr of 0.86 mg/dL). Liver Function Tests: Recent Labs  Lab 11/25/18 2155  AST 21  ALT 17  ALKPHOS 79  BILITOT 0.2*  PROT 6.0*  ALBUMIN 3.5   No results for input(s): LIPASE, AMYLASE in the last 168 hours. No results for input(s): AMMONIA in the last 168 hours. Coagulation Profile: No results for input(s): INR, PROTIME in the last 168 hours. Cardiac Enzymes: No results for input(s): CKTOTAL, CKMB,  CKMBINDEX, TROPONINI in the last 168 hours. BNP (last 3 results) No results for input(s): PROBNP in the last 8760 hours. HbA1C: No results for input(s): HGBA1C in the last 72 hours. CBG: No results for input(s): GLUCAP in the last 168 hours. Lipid Profile: No results for input(s): CHOL, HDL, LDLCALC, TRIG, CHOLHDL, LDLDIRECT in the last 72 hours. Thyroid Function Tests: No results for input(s): TSH, T4TOTAL, FREET4, T3FREE, THYROIDAB in the last 72 hours. Anemia Panel: No results for input(s): VITAMINB12, FOLATE, FERRITIN, TIBC, IRON, RETICCTPCT in  the last 72 hours. Urine analysis:    Component Value Date/Time   COLORURINE YELLOW 11/13/2018 1910   APPEARANCEUR CLEAR 11/13/2018 1910   APPEARANCEUR Clear 06/11/2018 1120   LABSPEC 1.024 11/13/2018 1910   PHURINE 5.0 11/13/2018 1910   GLUCOSEU NEGATIVE 11/13/2018 1910   GLUCOSEU NEGATIVE 08/14/2011 0815   HGBUR NEGATIVE 11/13/2018 1910   BILIRUBINUR NEGATIVE 11/13/2018 1910   BILIRUBINUR Negative 06/11/2018 1120   KETONESUR 5 (A) 11/13/2018 1910   PROTEINUR NEGATIVE 11/13/2018 1910   UROBILINOGEN 0.2 12/30/2013 1248   NITRITE NEGATIVE 11/13/2018 1910   LEUKOCYTESUR NEGATIVE 11/13/2018 1910    Radiological Exams on Admission: Dg Chest 1 View  Result Date: 11/25/2018 CLINICAL DATA:  68 year old female with fall and right hip fracture. EXAM: CHEST  1 VIEW COMPARISON:  Chest radiograph dated 11/13/2018 FINDINGS: Mild chronic bronchitic changes. No focal consolidation, pleural effusion, or pneumothorax. The cardiac silhouette is within normal limits. Atherosclerotic calcification of the aortic arch. No acute osseous pathology. IMPRESSION: No focal consolidation. Electronically Signed   By: Anner Crete M.D.   On: 11/25/2018 21:52   Ct Head Wo Contrast  Result Date: 11/26/2018 CLINICAL DATA:  68 year old female with dementia and fall. EXAM: CT HEAD WITHOUT CONTRAST TECHNIQUE: Contiguous axial images were obtained from the base of  the skull through the vertex without intravenous contrast. COMPARISON:  Head CT dated 11/13/2018 FINDINGS: Brain: Mild age-related atrophy and chronic microvascular ischemic changes. Bilateral basal ganglia calcifications as well as cortical laminar calcification in the occipital lobes and areas of calcification in the cerebellum noted, similar to prior CT. There is no acute intracranial hemorrhage. No mass effect or midline shift. No extra-axial fluid collection. Vascular: No hyperdense vessel or unexpected calcification. Skull: Normal. Negative for fracture or focal lesion. Sinuses/Orbits: No acute finding. Other: None IMPRESSION: 1. No acute intracranial hemorrhage. 2. Age-related atrophy and chronic microvascular ischemic changes. Electronically Signed   By: Anner Crete M.D.   On: 11/26/2018 00:09   Dg Hip Unilat W Or Wo Pelvis 2-3 Views Right  Result Date: 11/25/2018 CLINICAL DATA:  68 year old female with fall and right hip pain. EXAM: DG HIP (WITH OR WITHOUT PELVIS) 2-3V RIGHT COMPARISON:  Right hip radiograph dated 11/13/2018 FINDINGS: There is a displaced and comminuted appearing intertrochanteric fracture of the right femur. The bones are osteopenic. There is no dislocation. The soft tissues are unremarkable. IMPRESSION: Displaced and comminuted intertrochanteric fracture of the right femur. Electronically Signed   By: Anner Crete M.D.   On: 11/25/2018 21:50    EKG: Pending at this time.  Assessment/Plan Principal Problem:   Hip fracture [facility]) Active Problems:   Fall at home, initial encounter   Leukocytosis   COPD (chronic obstructive pulmonary disease) (HCC)   Right hip fracture secondary to a fall X-ray of right hip and pelvis showing displaced and comminuted intertrochanteric fracture of the right femur. -ED provider discussed the case with Dr. Lorin Mercy from orthopedics, planning on taking the patient to the OR in the morning. -Keep n.p.o. Gentle IV fluid hydration. -Pain  management: IV morphine 0.5 mg every 2 hours as needed, Norco 5-325 mg 1 to 2 tablets every 6 hours as needed  Mild leukocytosis Likely reactive.  No temperature recorded in the ED.  White count 12.7.  Chest x-ray not suggestive of pneumonia. -Check UA -Repeat CBC in a.m.  COPD -Stable.  DuoNebs PRN.  Pharmacy med rec pending.  DVT prophylaxis: SCDs Code Status: Full code Family Communication: No family available. Disposition Plan: Anticipate  discharge after clinical improvement. Consults called: Orthopedics Admission status: It is my clinical opinion that admission to [name] is reasonable and necessary in this 68 y.o. female . presenting with right hip fracture secondary to fall.  Orthopedics planning on taking the patient to the OR in the morning.  Given the aforementioned, the predictability of an adverse outcome is felt to be significant. I expect that the patient will require at least 2 midnights in the hospital to treat this condition.   The medical decision making on this patient was of high complexity and the patient is at high risk for clinical deterioration, therefore this is a level 3 visit.  Shela Leff MD [facility] Pager [phone]  If 7PM-7AM, please contact night-coverage [email or web address] Password [email or web address]  11/26/2018, 1:39 AM

## 2018-11-26 NOTE — Care Management (Signed)
CM consult acknowledged to assist with any HH/DME needs. Awaiting PT/OT eval for DCP recommendations and will continue to follow.  Gardy Montanari RN, BSN, NCM-BC, ACM-RN [phone] 

## 2018-11-27 ENCOUNTER — Encounter (HOSPITAL_COMMUNITY): Payer: Self-pay | Admitting: General Practice

## 2018-11-27 DIAGNOSIS — W19XXXA Unspecified fall, initial encounter: Secondary | ICD-10-CM

## 2018-11-27 DIAGNOSIS — Y92009 Unspecified place in unspecified non-institutional (private) residence as the place of occurrence of the external cause: Secondary | ICD-10-CM

## 2018-11-27 DIAGNOSIS — S72141A Displaced intertrochanteric fracture of right femur, initial encounter for closed fracture: Principal | ICD-10-CM

## 2018-11-27 LAB — BASIC METABOLIC PANEL
Anion gap: 7 (ref 5–15)
BUN: 8 mg/dL (ref 8–23)
CO2: 25 mmol/L (ref 22–32)
Calcium: 8 mg/dL — ABNORMAL LOW (ref 8.9–10.3)
Chloride: 109 mmol/L (ref 98–111)
Creatinine, Ser: 0.59 mg/dL (ref 0.44–1.00)
GFR calc Af Amer: >60 mL/min (ref >60)
GFR calc non Af Amer: >60 mL/min (ref >60)
Glucose, Bld: 123 mg/dL — ABNORMAL HIGH (ref 70–99)
Potassium: 3.7 mmol/L (ref 3.5–5.1)
Sodium: 141 mmol/L (ref 135–145)

## 2018-11-27 LAB — CBC
HCT: 29.4 % — ABNORMAL LOW (ref 36.0–46.0)
Hemoglobin: 9.7 g/dL — ABNORMAL LOW (ref 12.0–15.0)
MCH: 31.6 pg (ref 26.0–34.0)
MCHC: 33 g/dL (ref 30.0–36.0)
MCV: 95.8 fL (ref 80.0–100.0)
Platelets: 246 10*3/uL (ref 150–400)
RBC: 3.07 MIL/uL — ABNORMAL LOW (ref 3.87–5.11)
RDW: 13.6 % (ref 11.5–15.5)
WBC: 10.1 10*3/uL (ref 4.0–10.5)
nRBC: 0 % (ref 0.0–0.2)

## 2018-11-27 LAB — GLUCOSE, CAPILLARY: Glucose-Capillary: 111 mg/dL — ABNORMAL HIGH (ref 70–99)

## 2018-11-27 MED ORDER — HALOPERIDOL LACTATE 5 MG/ML IJ SOLN: 2.0000 mg | Freq: Four times a day (QID) | INTRAMUSCULAR | Status: DC | PRN

## 2018-11-27 MED ORDER — INFLUENZA VAC A&B SA ADJ QUAD 0.5 ML IM PRSY
0.5000 mL | PREFILLED_SYRINGE | INTRAMUSCULAR | Status: AC
  Administered 2018-12-01: 15:00:00 0.5 mL via INTRAMUSCULAR
  Filled 2018-11-27 (×2): qty 0.5

## 2018-11-27 MED ORDER — LORAZEPAM 2 MG/ML IJ SOLN
0.5000 mg | Freq: Four times a day (QID) | INTRAMUSCULAR | Status: DC | PRN
  Administered 2018-11-27 – 2018-11-30 (×2): 0.5 mg via INTRAVENOUS
  Filled 2018-11-27 (×2): qty 1

## 2018-11-27 NOTE — Evaluation (Signed)
Occupational Therapy Evaluation Patient Details Name: Desiree Salinas MRN: [id number] DOB: 1951-01-25 Today's Date: 11/27/2018    History of Present Illness Pt is a 68 y.o. female admitted from SNF on 11/25/18 after fall sustaining R intertrochanteric hip fx; now s/p R hip IM nail 9/23. PMH includes alcoholic dementia, COPD, depression. Of note, recent admission 11/14/2018 with failure to thrive and d/c to SNF.   Clinical Impression   No family available to determine PLOF, but likely dependent other than self feeding with min assist and participating in some aspects of grooming. Pt assisted to EOB and then chair with +2 max to total assist. Pt cooperative throughout session. Recommending SNF. Will defer further OT to SNF.    Follow Up Recommendations  SNF;Supervision/Assistance - 24 hour    Equipment Recommendations       Recommendations for Other Services       Precautions / Restrictions Precautions Precautions: Fall Restrictions Weight Bearing Restrictions: Yes RLE Weight Bearing: Weight bearing as tolerated      Mobility Bed Mobility Overal bed mobility: Needs Assistance Bed Mobility: Supine to Sit     Supine to sit: +2 for physical assistance;Total assist     General bed mobility comments: assist for all aspects  Transfers Overall transfer level: Needs assistance Equipment used: 2 person hand held assist Transfers: Sit to/from Stand;Stand Pivot Transfers Sit to Stand: Max assist;+2 physical assistance Stand pivot transfers: +2 physical assistance;Max assist       General transfer comment: assist to rise, steady and pivot to chair, pt not able to stand fully upright    Balance Overall balance assessment: Needs assistance   Sitting balance-Leahy Scale: Poor   Postural control: Posterior lean Standing balance support: Bilateral upper extremity supported;During functional activity Standing balance-Leahy Scale: Zero                             ADL  either performed or assessed with clinical judgement   ADL                                         General ADL Comments: pt able to self feed with min assist/set up, otherwise dependent     Vision Patient Visual Report: No change from baseline       Perception     Praxis      Pertinent Vitals/Pain Pain Assessment: Faces Faces Pain Scale: Hurts even more Pain Location: R hip Pain Descriptors / Indicators: Aching Pain Intervention(s): Monitored during session;Patient requesting pain meds-RN notified;Ice applied;Repositioned     Hand Dominance Right   Extremity/Trunk Assessment Upper Extremity Assessment Upper Extremity Assessment: Overall WFL for tasks assessed(tremulous at times)   Lower Extremity Assessment Lower Extremity Assessment: Defer to PT evaluation   Cervical / Trunk Assessment Cervical / Trunk Assessment: Kyphotic   Communication Communication Communication: Expressive difficulties   Cognition Arousal/Alertness: Awake/alert Behavior During Therapy: Restless;Anxious Overall Cognitive Status: History of cognitive impairments - at baseline                                     General Comments       Exercises     Shoulder Instructions      Home Living Family/patient expects to be discharged to:: Skilled nursing facility Living Arrangements:  Non-relatives/Friends                               Additional Comments: pt from memory care at [facility]      Prior Functioning/Environment Level of Independence: Needs assistance                 OT Problem List:        OT Treatment/Interventions:      OT Goals(Current goals can be found in the care plan section) Acute Rehab OT Goals Patient Stated Goal: pt agreeable to OOB  OT Frequency:     Barriers to D/C:            Co-evaluation              AM-PAC OT "6 Clicks" Daily Activity     Outcome Measure Help from another person  eating meals?: A Little Help from another person taking care of personal grooming?: A Lot Help from another person toileting, which includes using toliet, bedpan, or urinal?: Total Help from another person bathing (including washing, rinsing, drying)?: Total Help from another person to put on and taking off regular upper body clothing?: Total Help from another person to put on and taking off regular lower body clothing?: Total 6 Click Score: 9   End of Session Equipment Utilized During Treatment: Gait belt Nurse Communication: Mobility status  Activity Tolerance: Patient tolerated treatment well Patient left: in chair;with call bell/phone within reach;with chair alarm set;with nursing/sitter in room  OT Visit Diagnosis: Unsteadiness on feet (R26.81);Pain;Other symptoms and signs involving cognitive function;Muscle weakness (generalized) (M62.81);History of falling (Z91.81)                Time: [id number] OT Time Calculation (min): 30 min Charges:  OT General Charges $OT Visit: 1 Visit OT Evaluation $OT Eval Moderate Complexity: 1 Mod OT Treatments $Therapeutic Activity: 8-22 mins  Nestor Lewandowsky, OTR/L Acute Rehabilitation Services Pager: [phone] Office: [phone]  Malka So 11/27/2018, 11:32 AM
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
## 2018-11-27 NOTE — Progress Notes (Signed)
Patient ID: Desiree Salinas, female   DOB: 03/30/1950, 68 y.o.   MRN: [id number] Patient resting comfortably, plan for discharge back to [facility], weight bearing as tolerated, tylenol for pain, ASA for DVT prophylaxis.

## 2018-11-27 NOTE — Progress Notes (Signed)
PT Cancellation Note  Patient Details Name: LEELA CRACKEL MRN: [id number] DOB: 1951/02/04   Cancelled Treatment:    Reason Eval/Treat Not Completed: Active bedrest order. Please remove when patient appropriate to participate with PT Evaluation.  Mabeline Caras, PT, DPT Acute Rehabilitation Services  Pager [phone] Office [place] 11/27/2018, 7:35 AM

## 2018-11-27 NOTE — NC FL2 (Signed)
[place] LEVEL OF CARE SCREENING TOOL     IDENTIFICATION  Patient Name: Desiree Salinas Birthdate: 1951/01/03 Sex: female Admission Date (Current Location): 11/25/2018  [facility] and [place] Number:  Herbalist and Address:  [facility], [place] [address] [place], [place] [address]      Provider Number: [id number]  Attending Physician Name and Address:  [place] Eric J, DO  Relative Name and Phone Number:  [name]) Sister [phone]    Current Level of Care: Hospital Recommended Level of Care: [place], Memory Care Prior Approval Number:    Date Approved/Denied:   PASRR Number:    Discharge Plan: Other (Comment)(ALF)    Current Diagnoses: Patient Active Problem List   Diagnosis Date Noted  . Hip fracture ([place]) 11/26/2018  . Fall at home, initial encounter 11/26/2018  . Leukocytosis 11/26/2018  . COPD (chronic obstructive pulmonary disease) ([place]) 11/26/2018  . Intertrochanteric fracture of right hip ([place]) 11/26/2018  . Closed intertrochanteric fracture of hip, right, initial encounter ([place])   . Dementia with aggressive behavior ([place]) 11/08/2018  . Iron deficiency anemia 07/15/2018  . Fall 06/09/2018  . Alcoholic dementia ([place]) [id number]  . Degenerative scoliosis 02/10/2018  . Diarrhea 10/14/2013  . Cigarette smoker 08/08/2011  . VAIN I (vaginal intraepithelial neoplasia grade I)   . COLONIC POLYPS 12/21/2007  . ALLERGIC RHINITIS 12/21/2007  . 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 HYPERCHOLESTEROLEMIA 07/05/2007  . 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 DERMATITIS 07/05/2007  . SHINGLES, HX OF 07/05/2007    Orientation RESPIRATION BLADDER Height & Weight     Self  Normal Incontinent Weight: 59.4 kg Height:  5\' 5"  (165.1 cm)  BEHAVIORAL SYMPTOMS/MOOD NEUROLOGICAL BOWEL NUTRITION STATUS      Incontinent Diet(regular)  AMBULATORY STATUS COMMUNICATION OF NEEDS Skin   Extensive  Assist Verbally Surgical wounds(RIght HIp surgical incision)                       Personal Care Assistance Level of Assistance  Bathing, Feeding, Dressing Bathing Assistance: Maximum assistance Feeding assistance: Limited assistance Dressing Assistance: Maximum assistance Total Care Assistance: Limited assistance   Functional Limitations Info  Sight, Hearing, Speech Sight Info: Impaired Hearing Info: Adequate Speech Info: Adequate    SPECIAL CARE FACTORS FREQUENCY  PT (By licensed PT), OT (By licensed OT)     PT Frequency: 3x weekly OT Frequency: 3x weekly            Contractures Contractures Info: Not present    Additional Factors Info  Code Status, Allergies, Psychotropic Code Status Info: Full Code Allergies Info: Eucerin ( Basis Facial Moisturizer); Latex; Nikcel; Cobalt Psychotropic Info: See med list         Current Medications (11/27/2018):  This is the current hospital active medication list Current Facility-Administered Medications  Medication Dose Route Frequency Provider Last Rate Last Dose  . 0.9 %  sodium chloride infusion   Intravenous Continuous Newt Minion, MD 75 mL/hr at 11/26/18 1530    . acetaminophen (TYLENOL) tablet 325-650 mg  325-650 mg Oral Q6H PRN Newt Minion, MD   650 mg at 11/27/18 1124  . amphetamine-dextroamphetamine (ADDERALL XR) 24 hr capsule 15 mg  15 mg Oral Daily Jonetta Osgood, MD   15 mg at 11/27/18 1125  . aspirin EC tablet 325 mg  325 mg Oral Q breakfast Newt Minion, MD   325 mg at 11/27/18 1125  .  bisacodyl (DULCOLAX) EC tablet 5 mg  5 mg Oral Daily PRN Newt Minion, MD      . busPIRone (BUSPAR) tablet 20 mg  20 mg Oral Daily Jonetta Osgood, MD   20 mg at 11/27/18 1126  . docusate sodium (COLACE) capsule 100 mg  100 mg Oral BID Newt Minion, MD   100 mg at 11/27/18 1125  . escitalopram (LEXAPRO) tablet 20 mg  20 mg Oral Daily Jonetta Osgood, MD   20 mg at 11/27/18 1125  . HYDROcodone-acetaminophen  (NORCO) 7.5-325 MG per tablet 1-2 tablet  1-2 tablet Oral Q4H PRN Newt Minion, MD      . HYDROcodone-acetaminophen (NORCO/VICODIN) 5-325 MG per tablet 1-2 tablet  1-2 tablet Oral Q4H PRN Newt Minion, MD      . Derrill Memo ON 11/28/2018] influenza vaccine adjuvanted (FLUAD) injection 0.5 mL  0.5 mL Intramuscular Tomorrow-1000 [place] Eric J, DO      . ipratropium-albuterol (DUONEB) 0.5-2.5 (3) MG/3ML nebulizer solution 3 mL  3 mL Nebulization Q6H PRN Newt Minion, MD      . magnesium citrate solution 1 Bottle  1 Bottle Oral Once PRN Newt Minion, MD      . menthol-cetylpyridinium (CEPACOL) lozenge 3 mg  1 lozenge Oral PRN Newt Minion, MD       Or  . phenol (CHLORASEPTIC) mouth spray 1 spray  1 spray Mouth/Throat PRN Newt Minion, MD      . metoCLOPramide (REGLAN) tablet 5-10 mg  5-10 mg Oral Q8H PRN Newt Minion, MD       Or  . metoCLOPramide (REGLAN) injection 5-10 mg  5-10 mg Intravenous Q8H PRN Newt Minion, MD      . morphine 2 MG/ML injection 0.5-1 mg  0.5-1 mg Intravenous Q2H PRN Newt Minion, MD      . ondansetron [facility]) tablet 4 mg  4 mg Oral Q6H PRN Newt Minion, MD       Or  . ondansetron [facility]) injection 4 mg  4 mg Intravenous Q6H PRN Newt Minion, MD      . pantoprazole (PROTONIX) EC tablet 40 mg  40 mg Oral Daily Jonetta Osgood, MD   40 mg at 11/27/18 1124  . polyethylene glycol (MIRALAX / GLYCOLAX) packet 17 g  17 g Oral Daily PRN Newt Minion, MD      . QUEtiapine (SEROQUEL) tablet 100 mg  100 mg Oral QHS Jonetta Osgood, MD   100 mg at 11/26/18 2206  . vitamin C (ASCORBIC ACID) tablet 500 mg  500 mg Oral Daily Jonetta Osgood, MD   500 mg at 11/27/18 1125     Discharge Medications: Please see discharge summary for a list of discharge medications.  Relevant Imaging Results:  Relevant Lab Results:   Additional Information SS# [id number]  Midge Minium RN, BSN, NCM-BC, ACM-RN [phone]

## 2018-11-27 NOTE — Progress Notes (Signed)
PROGRESS NOTE    [name]  [id number] DOB: December 01, 1950 DOA: 11/25/2018 PCP: [name], MD    Brief Narrative:   Desiree Salinas is a 68 y.o. female with medical history significant of dementia, anemia, anxiety, arthritis, COPD, depression, hyperlipidemia presenting to the hospital via EMS from her skilled nursing facility for evaluation of right hip pain after a witnessed fall.  Patient has dementia and no history could be obtained from her.  She is oriented to self only and has no complaints.  Does not recall having a fall.  In the ED, no temperature recorded.  Systolic in the 0000000.  White count 12.7.  SARS-CoV-2 test negative.  Chest x-ray showing no focal consolidation.  CT head negative for acute finding.  X-ray of right hip and pelvis showing displaced and comminuted intertrochanteric fracture of the right femur.   Assessment & Plan:   Principal Problem:   Hip fracture [facility]) Active Problems:   Fall at home, initial encounter   Leukocytosis   COPD (chronic obstructive pulmonary disease) (HCC)   Closed intertrochanteric fracture of hip, right, initial encounter [facility])   Intertrochanteric fracture of right hip ([place])   Right hip fracture Patient presenting from SNF following witnessed fall.  X-ray right hip/pelvis notable for displaced and comminuted intertrochanteric fracture of the right femur.  Patient underwent IM nail by Dr.Duda on 11/26/2018.  Continue DVT prophylaxis with aspirin 325 mg p.o. daily per orthopedics.  OT recommends SNF.  Continue pain control with Norco and morphine prn.  We will try to avoid IV narcotics as able.  Bowel regimen.  Plan to return to [facility].  Social work for coordination.  Anemia, normocytic Acute postoperative blood loss anemia Hemoglobin on admission 12.7 with an MCV of 95.9.  --Hgb 12.7-->11.4-->9.7; no signs of active bleeding, surgical dressing clean/dry/intact --Continue monitor hemoglobin daily --DVT prophylaxis with  aspirin as above  Dementia Anxiety/depression Continue home Adderall 15 mg p.o. daily, BuSpar 20 mg p.o. daily, Lexapro 20 mg p.o. daily, Seroquel 100 mg p.o. nightly.  GERD: Continue PPI   DVT prophylaxis: Aspirin, SCDs Code Status: Full code Family Communication: None present at bedside Disposition Plan: Plan return to [place] [facility]   Consultants:   Orthopedics - Dr. Sharol Given  Procedures:   IM nail - Dr. Sharol Given - 9/23   Antimicrobials:   Perioperative antibiotics with Ancef   Subjective: Patient seen and examined at bedside, sitter present.  Sleeping.  Will open eyes and answer simple questions.  States pain control.  States that she is hungry and thirsty this morning.  Nursing reports agitation overnight.  Patient without any other complaints at this time.  Denies headache, no chest pain, no palpitations, no shortness of breath, no abdominal pain.  No other acute events overnight per nursing staff.  Objective: Vitals:   11/26/18 2104 11/26/18 2359 11/27/18 0346 11/27/18 1009  BP: 119/62 124/72 (!) 117/58 104/88  Pulse: 85 91 79 85  Resp: 18 16 18 14   Temp: 97.6 F (36.4 C) 98.5 F (36.9 C) 97.7 F (36.5 C) 97.7 F (36.5 C)  TempSrc: Axillary Oral Oral Oral  SpO2:  100% 100% 96%  Weight:      Height:        Intake/Output Summary (Last 24 hours) at 11/27/2018 1339 Last data filed at 11/27/2018 1215 Gross per 24 hour  Intake 411.63 ml  Output -  Net 411.63 ml   Filed Weights   11/26/18 1454  Weight: 59.4 kg  Examination:  General exam: Appears calm and comfortable, sleeping but arousable Respiratory system: Clear to auscultation. Respiratory effort normal. Cardiovascular system: S1 & S2 heard, RRR. No JVD, murmurs, rubs, gallops or clicks. No pedal edema. Gastrointestinal system: Abdomen is nondistended, soft and nontender. No organomegaly or masses felt. Normal bowel sounds heard. Central nervous system: Alert and oriented. No focal neurological  deficits. Extremities: Moves all extremities independently, surgical dressing noted in place right hip, c/d/i Skin: No rashes, lesions or ulcers Psychiatry: Confused, depressed mood/flat affect    Data Reviewed: I have personally reviewed following labs and imaging studies  CBC: Recent Labs  Lab 11/25/18 2155 11/26/18 0257 11/27/18 0835  WBC 12.7* 12.8* 10.1  NEUTROABS 10.4*  --   --   HGB 12.3 11.4* 9.7*  HCT 37.1 35.4* 29.4*  MCV 95.9 96.5 95.8  PLT 317 303 0000000   Basic Metabolic Panel: Recent Labs  Lab 11/25/18 2155 11/27/18 0835  NA 142 141  K 4.1 3.7  CL 112* 109  CO2 22 25  GLUCOSE 128* 123*  BUN 10 8  CREATININE 0.86 0.59  CALCIUM 8.4* 8.0*   GFR: Estimated Creatinine Clearance: 61.4 mL/min (by C-G formula based on SCr of 0.59 mg/dL). Liver Function Tests: Recent Labs  Lab 11/25/18 2155  AST 21  ALT 17  ALKPHOS 79  BILITOT 0.2*  PROT 6.0*  ALBUMIN 3.5   No results for input(s): LIPASE, AMYLASE in the last 168 hours. No results for input(s): AMMONIA in the last 168 hours. Coagulation Profile: No results for input(s): INR, PROTIME in the last 168 hours. Cardiac Enzymes: No results for input(s): CKTOTAL, CKMB, CKMBINDEX, TROPONINI in the last 168 hours. BNP (last 3 results) No results for input(s): PROBNP in the last 8760 hours. HbA1C: No results for input(s): HGBA1C in the last 72 hours. CBG: Recent Labs  Lab 11/25/18 2112  GLUCAP 111*   Lipid Profile: No results for input(s): CHOL, HDL, LDLCALC, TRIG, CHOLHDL, LDLDIRECT in the last 72 hours. Thyroid Function Tests: No results for input(s): TSH, T4TOTAL, FREET4, T3FREE, THYROIDAB in the last 72 hours. Anemia Panel: No results for input(s): VITAMINB12, FOLATE, FERRITIN, TIBC, IRON, RETICCTPCT in the last 72 hours. Sepsis Labs: No results for input(s): PROCALCITON, LATICACIDVEN in the last 168 hours.  Recent Results (from the past 240 hour(s))  SARS Coronavirus 2 [facility] order, Performed  in [facility] hospital lab) Nasopharyngeal Nasopharyngeal Swab     Status: None   Collection Time: 11/18/18 10:31 AM   Specimen: Nasopharyngeal Swab  Result Value Ref Range Status   SARS Coronavirus 2 NEGATIVE NEGATIVE Final    Comment: (NOTE) If result is NEGATIVE SARS-CoV-2 target nucleic acids are NOT DETECTED. The SARS-CoV-2 RNA is generally detectable in upper and lower  respiratory specimens during the acute phase of infection. The lowest  concentration of SARS-CoV-2 viral copies this assay can detect is 250  copies / mL. A negative result does not preclude SARS-CoV-2 infection  and should not be used as the sole basis for treatment or other  patient management decisions.  A negative result may occur with  improper specimen collection / handling, submission of specimen other  than nasopharyngeal swab, presence of viral mutation(s) within the  areas targeted by this assay, and inadequate number of viral copies  (<250 copies / mL). A negative result must be combined with clinical  observations, patient history, and epidemiological information. If result is POSITIVE SARS-CoV-2 target nucleic acids are DETECTED. The SARS-CoV-2 RNA is generally detectable in  upper and lower  respiratory specimens dur ing the acute phase of infection.  Positive  results are indicative of active infection with SARS-CoV-2.  Clinical  correlation with patient history and other diagnostic information is  necessary to determine patient infection status.  Positive results do  not rule out bacterial infection or co-infection with other viruses. If result is PRESUMPTIVE POSTIVE SARS-CoV-2 nucleic acids MAY BE PRESENT.   A presumptive positive result was obtained on the submitted specimen  and confirmed on repeat testing.  While 2019 novel coronavirus  (SARS-CoV-2) nucleic acids may be present in the submitted sample  additional confirmatory testing may be necessary for epidemiological  and / or clinical  management purposes  to differentiate between  SARS-CoV-2 and other Sarbecovirus currently known to infect humans.  If clinically indicated additional testing with an alternate test  methodology [phone]) is advised. The SARS-CoV-2 RNA is generally  detectable in upper and lower respiratory sp ecimens during the acute  phase of infection. The expected result is Negative. Fact Sheet for Patients:  StrictlyIdeas.no Fact Sheet for Healthcare Providers: BankingDealers.co.za This test is not yet approved or cleared by the [place] FDA and has been authorized for detection and/or diagnosis of SARS-CoV-2 by FDA under an Emergency Use Authorization (EUA).  This EUA will remain in effect (meaning this test can be used) for the duration of the COVID-19 declaration under Section 564(b)(1) of the Act, 21 U.S.C. section 360bbb-3(b)(1), unless the authorization is terminated or revoked sooner. Performed at [facility], [place] [address] [place], [place] [address]   SARS Coronavirus 2 [facility] order, Performed in [facility] hospital lab) Nasopharyngeal Nasopharyngeal Swab     Status: None   Collection Time: 11/25/18 11:02 PM   Specimen: Nasopharyngeal Swab  Result Value Ref Range Status   SARS Coronavirus 2 NEGATIVE NEGATIVE Final    Comment: (NOTE) If result is NEGATIVE SARS-CoV-2 target nucleic acids are NOT DETECTED. The SARS-CoV-2 RNA is generally detectable in upper and lower  respiratory specimens during the acute phase of infection. The lowest  concentration of SARS-CoV-2 viral copies this assay can detect is 250  copies / mL. A negative result does not preclude SARS-CoV-2 infection  and should not be used as the sole basis for treatment or other  patient management decisions.  A negative result may occur with  improper specimen collection / handling, submission of specimen other  than nasopharyngeal swab, presence of viral  mutation(s) within the  areas targeted by this assay, and inadequate number of viral copies  (<250 copies / mL). A negative result must be combined with clinical  observations, patient history, and epidemiological information. If result is POSITIVE SARS-CoV-2 target nucleic acids are DETECTED. The SARS-CoV-2 RNA is generally detectable in upper and lower  respiratory specimens dur ing the acute phase of infection.  Positive  results are indicative of active infection with SARS-CoV-2.  Clinical  correlation with patient history and other diagnostic information is  necessary to determine patient infection status.  Positive results do  not rule out bacterial infection or co-infection with other viruses. If result is PRESUMPTIVE POSTIVE SARS-CoV-2 nucleic acids MAY BE PRESENT.   A presumptive positive result was obtained on the submitted specimen  and confirmed on repeat testing.  While 2019 novel coronavirus  (SARS-CoV-2) nucleic acids may be present in the submitted sample  additional confirmatory testing may be necessary for epidemiological  and / or clinical management purposes  to differentiate between  SARS-CoV-2 and other  Sarbecovirus currently known to infect humans.  If clinically indicated additional testing with an alternate test  methodology [phone]) is advised. The SARS-CoV-2 RNA is generally  detectable in upper and lower respiratory sp ecimens during the acute  phase of infection. The expected result is Negative. Fact Sheet for Patients:  StrictlyIdeas.no Fact Sheet for Healthcare Providers: BankingDealers.co.za This test is not yet approved or cleared by the [place] FDA and has been authorized for detection and/or diagnosis of SARS-CoV-2 by FDA under an Emergency Use Authorization (EUA).  This EUA will remain in effect (meaning this test can be used) for the duration of the COVID-19 declaration under Section 564(b)(1)  of the Act, 21 U.S.C. section 360bbb-3(b)(1), unless the authorization is terminated or revoked sooner. Performed at [facility], [place] [address], [place], [place] [address]          Radiology Studies: Dg Chest 1 View  Result Date: 11/25/2018 CLINICAL DATA:  68 year old female with fall and right hip fracture. EXAM: CHEST  1 VIEW COMPARISON:  Chest radiograph dated 11/13/2018 FINDINGS: Mild chronic bronchitic changes. No focal consolidation, pleural effusion, or pneumothorax. The cardiac silhouette is within normal limits. Atherosclerotic calcification of the aortic arch. No acute osseous pathology. IMPRESSION: No focal consolidation. Electronically Signed   By: Anner Crete M.D.   On: 11/25/2018 21:52   Ct Head Wo Contrast  Result Date: 11/26/2018 CLINICAL DATA:  68 year old female with dementia and fall. EXAM: CT HEAD WITHOUT CONTRAST TECHNIQUE: Contiguous axial images were obtained from the base of the skull through the vertex without intravenous contrast. COMPARISON:  Head CT dated 11/13/2018 FINDINGS: Brain: Mild age-related atrophy and chronic microvascular ischemic changes. Bilateral basal ganglia calcifications as well as cortical laminar calcification in the occipital lobes and areas of calcification in the cerebellum noted, similar to prior CT. There is no acute intracranial hemorrhage. No mass effect or midline shift. No extra-axial fluid collection. Vascular: No hyperdense vessel or unexpected calcification. Skull: Normal. Negative for fracture or focal lesion. Sinuses/Orbits: No acute finding. Other: None IMPRESSION: 1. No acute intracranial hemorrhage. 2. Age-related atrophy and chronic microvascular ischemic changes. Electronically Signed   By: Anner Crete M.D.   On: 11/26/2018 00:09   Dg C-arm 1-60 Min-no Report  Result Date: 11/26/2018 Fluoroscopy was utilized by the requesting physician.  No radiographic interpretation.   Dg Hip Unilat W Or Wo Pelvis 2-3  Views Right  Result Date: 11/25/2018 CLINICAL DATA:  69 year old female with fall and right hip pain. EXAM: DG HIP (WITH OR WITHOUT PELVIS) 2-3V RIGHT COMPARISON:  Right hip radiograph dated 11/13/2018 FINDINGS: There is a displaced and comminuted appearing intertrochanteric fracture of the right femur. The bones are osteopenic. There is no dislocation. The soft tissues are unremarkable. IMPRESSION: Displaced and comminuted intertrochanteric fracture of the right femur. Electronically Signed   By: Anner Crete M.D.   On: 11/25/2018 21:50        Scheduled Meds: . amphetamine-dextroamphetamine  15 mg Oral Daily  . aspirin EC  325 mg Oral Q breakfast  . busPIRone  20 mg Oral Daily  . docusate sodium  100 mg Oral BID  . escitalopram  20 mg Oral Daily  . [START ON 11/28/2018] influenza vaccine adjuvanted  0.5 mL Intramuscular Tomorrow-1000  . pantoprazole  40 mg Oral Daily  . QUEtiapine  100 mg Oral QHS  . vitamin C  500 mg Oral Daily   Continuous Infusions: . sodium chloride 75 mL/hr at 11/26/18 1530  LOS: 1 day    Time spent: 29 minutes spent on chart review, discussion with nursing staff, consultants, updating family and interview/physical exam; more than 50% of that time was spent in counseling and/or coordination of care.    Eric J [place] DO [facility] Pager [phone]  If 7PM-7AM, please contact night-coverage [email or web address] Password [email or web address] 11/27/2018, 1:39 PM

## 2018-11-28 LAB — BASIC METABOLIC PANEL
Anion gap: 7 (ref 5–15)
BUN: 10 mg/dL (ref 8–23)
CO2: 24 mmol/L (ref 22–32)
Calcium: 7.9 mg/dL — ABNORMAL LOW (ref 8.9–10.3)
Chloride: 109 mmol/L (ref 98–111)
Creatinine, Ser: 0.73 mg/dL (ref 0.44–1.00)
GFR calc Af Amer: >60 mL/min (ref >60)
GFR calc non Af Amer: >60 mL/min (ref >60)
Glucose, Bld: 116 mg/dL — ABNORMAL HIGH (ref 70–99)
Potassium: 3.5 mmol/L (ref 3.5–5.1)
Sodium: 140 mmol/L (ref 135–145)

## 2018-11-28 LAB — CBC
HCT: 30.8 % — ABNORMAL LOW (ref 36.0–46.0)
Hemoglobin: 10.4 g/dL — ABNORMAL LOW (ref 12.0–15.0)
MCH: 32.1 pg (ref 26.0–34.0)
MCHC: 33.8 g/dL (ref 30.0–36.0)
MCV: 95.1 fL (ref 80.0–100.0)
Platelets: 253 10*3/uL (ref 150–400)
RBC: 3.24 MIL/uL — ABNORMAL LOW (ref 3.87–5.11)
RDW: 13.5 % (ref 11.5–15.5)
WBC: 10.2 10*3/uL (ref 4.0–10.5)
nRBC: 0 % (ref 0.0–0.2)

## 2018-11-28 NOTE — Evaluation (Addendum)
Physical Therapy Evaluation Patient Details Name: Desiree Salinas MRN: [id number] DOB: 09/21/1950 Today's Date: 11/28/2018   History of Present Illness  Pt is a 68 y.o. female admitted from SNF on 11/25/18 after fall sustaining R intertrochanteric hip fx; now s/p R hip IM nail 9/23. PMH includes alcoholic dementia, COPD, depression. Of note, recent admission 11/14/2018 with failure to thrive and d/c to SNF.    Clinical Impression  Pt presents with an overall decrease in functional mobility secondary to above. Pt poor historian due to baseline cognitive deficits; caregiver [name]) present and states that prior to initial admission earlier this month, pt ambulatory, living at home with 24/7 caregiver assist; was d/c to SNF and finally ended up at [facility] with continued PCA assist which has been going well. Today, pt requiring maxA+2 to [place] for mobility and ADL tasks. Pt would benefit from continued acute PT services to maximize functional mobility and independence prior to d/c with continued SNF-level therapies.     Follow Up Recommendations SNF;Supervision/Assistance - 24 hour(return to [facility] bed   Recommendations for Other Services       Precautions / Restrictions Precautions Precautions: Fall Restrictions Weight Bearing Restrictions: Yes RLE Weight Bearing: Weight bearing as tolerated      Mobility  Bed Mobility Overal bed mobility: Needs Assistance Bed Mobility: Rolling Rolling: Max assist;+2 for physical assistance;+2 for safety/equipment;Total assist         General bed mobility comments: MaxA+2 to totalA for rolling R/L for pericare and pad placement due to urine incontinence; pt able to minimally assist when cued to use hand rail, actively resisting some movement initially but able to be redirected/distracted by conversation. Clearly painful R hip  Transfers                    Ambulation/Gait                 Stairs            Wheelchair Mobility    Modified Rankin (Stroke Patients Only)       Balance                                             Pertinent Vitals/Pain Pain Assessment: Faces Faces Pain Scale: Hurts even more Pain Location: R hip with mobility Pain Descriptors / Indicators: Moaning;Grimacing;Guarding Pain Intervention(s): Monitored during session;Repositioned    Home Living Family/patient expects to be discharged to:: Skilled nursing facility Living Arrangements: Non-relatives/Friends               Additional Comments: pt from memory care at [facility]    Prior Function Level of Independence: Needs assistance   Gait / Transfers Assistance Needed: Caregiver [name]) present and reports prior to initial admission a few weeks ago, pt was ambulatory around house without DME although unsteady with falls; had almost 24/7 caregiver support. Since recent d/c, has been between multiple SNFs until finally settling at [facility]        Extremity/Trunk Assessment   Upper Extremity Assessment Upper Extremity Assessment: Overall WFL for tasks assessed    Lower Extremity Assessment Lower Extremity Assessment: Generalized weakness;RLE deficits/detail;Difficult to assess due to impaired cognition RLE Deficits / Details: s/p R hip IM nail RLE: Unable  to fully assess due to pain       Communication      Cognition Arousal/Alertness: Awake/alert Behavior During Therapy: Restless Overall Cognitive Status: History of cognitive impairments - at baseline                                 General Comments: Pt with hx of dementia. Unable to answer questions and would begin talking about unrelated subjects when asked questions. Pt with difficulty sequencing and required multimodal cues to perform mobility tasks.      General Comments      Exercises General Exercises  - Lower Extremity Heel Slides: PROM;Right;Supine Straight Leg Raises: PROM;Right;Supine   Assessment/Plan    PT Assessment Patient needs continued PT services  PT Problem List Decreased strength;Decreased balance;Decreased mobility;Decreased cognition;Decreased knowledge of use of DME;Decreased knowledge of precautions;Decreased safety awareness;Decreased range of motion       PT Treatment Interventions Gait training;DME instruction;Functional mobility training;Therapeutic activities;Therapeutic exercise;Balance training;Patient/family education;Cognitive remediation;Wheelchair mobility training    PT Goals (Current goals can be found in the Care Plan section)  Acute Rehab PT Goals Patient Stated Goal: Return to [facility] PT Goal Formulation: With family Time For Goal Achievement: 12/12/18 Potential to Achieve Goals: Fair    Frequency Min 2X/week   Barriers to discharge        Co-evaluation               AM-PAC PT "6 Clicks" Mobility  Outcome Measure Help needed turning from your back to your side while in a flat bed without using bedrails?: Total Help needed moving from lying on your back to sitting on the side of a flat bed without using bedrails?: Total Help needed moving to and from a bed to a chair (including a wheelchair)?: Total Help needed standing up from a chair using your arms (e.g., wheelchair or bedside chair)?: Total Help needed to walk in hospital room?: Total Help needed climbing 3-5 steps with a railing? : Total 6 Click Score: 6    End of Session   Activity Tolerance: Patient limited by pain Patient left: in bed;with call bell/phone within reach;with nursing/sitter in room;with bed alarm set;with family/visitor present Nurse Communication: Mobility status PT Visit Diagnosis: Unsteadiness on feet (R26.81);Muscle weakness (generalized) (M62.81);Other abnormalities of gait and mobility (R26.89)    Time: [id number] PT Time Calculation (min)  (ACUTE ONLY): 21 min   Charges:   PT Evaluation $PT Eval Moderate Complexity: 1 Mod     Mabeline Caras, PT, DPT Acute Rehabilitation Services  Pager [phone] Office [phone]  [name] 11/28/2018, 9:24 AM

## 2018-11-28 NOTE — Plan of Care (Signed)
  Problem: Coping: Goal: Level of anxiety will decrease Outcome: Progressing   Problem: Pain Managment: Goal: General experience of comfort will improve Outcome: Progressing   

## 2018-11-28 NOTE — Progress Notes (Signed)
Subjective: 2 Days Post-Op Procedure(s) (LRB): INTRAMEDULLARY (IM) NAIL INTERTROCHANTRIC RIGHT HIP (Right) Somnolent, but arouses to voice. Calm. Speech not intelligible.   Objective: Vital signs in last 24 hours: Temp:  [97.7 F (36.5 C)-99.4 F (37.4 C)] 99.4 F (37.4 C) (09/25 0903) Pulse Rate:  [85-97] 97 (09/25 0903) Resp:  [14-18] 17 (09/25 0903) BP: (97-106)/(55-88) 106/63 (09/25 0903) SpO2:  [96 %-99 %] 96 % (09/25 0903)  Intake/Output from previous day: 09/24 0701 - 09/25 0700 In: 1840.8 [P.O.:170; I.V.:1670.8] Out: 300 [Urine:300] Intake/Output this shift: No intake/output data recorded.  Recent Labs    11/25/18 2155 11/26/18 0257 11/27/18 0835 11/28/18 0339  HGB 12.3 11.4* 9.7* 10.4*   Recent Labs    11/27/18 0835 11/28/18 0339  WBC 10.1 10.2  RBC 3.07* 3.24*  HCT 29.4* 30.8*  PLT 246 253   Recent Labs    11/27/18 0835 11/28/18 0339  NA 141 140  K 3.7 3.5  CL 109 109  CO2 25 24  BUN 8 10  CREATININE 0.59 0.73  GLUCOSE 123* 116*  CALCIUM 8.0* 7.9*   No results for input(s): LABPT, INR in the last 72 hours.  Right hip incision intact. Mostly dried bloody drainage about proximal incision. No signs of infection or active drainage. Will change dressing today.    Assessment/Plan: 2 Days Post-Op Procedure(s) (LRB): INTRAMEDULLARY (IM) NAIL INTERTROCHANTRIC RIGHT HIP (Right) S/P IM nail right IT hip fx- POD#2- change dressing today.  WBAT and ASA for VTE prophylaxis. Okay to DC to [facility] from Ortho standpoint once medically ready.  Follow up in the office in 1 week.   Erlinda Hong, PA-C 11/28/2018, 9:10 AM  [place] [facility] [phone]

## 2018-11-28 NOTE — Care Management (Addendum)
Per MD note the patient is medically stable to transition back to [facility]. CM left a message with the Admissions Coordinator for [facility] requesting a return call. CM attempted to update patients sister [name] on the POC; VM left requesting a returned call. CM will continue to follow.  Midge Minium RN, BSN, NCM-BC, ACM-RN [phone]

## 2018-11-28 NOTE — TOC Initial Note (Addendum)
Transition of Care [facility]) - Initial/Assessment Note    Patient Details  Name: Desiree Salinas MRN: [id number] Date of Birth: [birth year]  Transition of Care [facility]) CM/SW Contact:    Midge Minium RN, BSN, NCM-BC, ACM-RN [phone] Phone Number: 11/28/2018, 11:35 AM  Clinical Narrative:                 CM spoke to the patient sister, Clance [facility]), who resides in [place], to discuss the POC. Patient recently discharged to  [facility] on 9/18, then sustained a right hip fracture. Patients sister is agreeable to the patient returning to [facility] to continue her rehab and care, but is requesting a hospital bed and swoop mattress. CM left a message with the Admissions Coordinator for [facility] requesting a return call-still waiting to inquire if the patient can return to the facility today.   Addendum: 11/28/18 @ 1328-Analyce Tavares RNCM- CM spoke to [facility]); the patient will need a negative COVID test within 24hr of returning back to the facility. CM updated Dr. [place] with the hospital bed to be ordered and will be delivered to the facility in preparation of a weekend discharge; Dr. [place] will order a COVID test. DME referral given to Learta Codding [facility] liaison); AVS updated. CM provided an update via phone to patients sister/POA [facility]. CM team will continue to follow.   Expected Discharge Plan: Memory Care([facility]) Barriers to Discharge: Other (comment)(awaiting to discuss DCP with [facility] Admissions Coordinator)   Patient Goals and CMS Choice Patient states their goals for this hospitalization and ongoing recovery are:: "family states to receive her rehab and good care"      Expected Discharge Plan and Services Expected Discharge Plan: Memory Care([facility]) In-house Referral: NA Discharge Planning Services: CM Consult Post Acute Care Choice: (Assisted Living Memory Care) Living arrangements for the past 2 months: [place] Expected Discharge Date: 11/29/18               DME Arranged: Hospital bed DME Agency: [place]   Time DME Agency Contacted: [phone] Representative spoke with at DME Agency: Learta Codding (liaison) [place] Arranged: NA [place] Agency: NA        Prior Living Arrangements/Services Living arrangements for the past 2 months: [place] Lives with:: Self Patient language and need for interpreter reviewed:: Yes Do you feel safe going back to the place where you live?: Yes      Need for Family Participation in Patient Care: Yes (Comment) Care giver support system in place?: Yes (comment) Current home services: DME Criminal Activity/Legal Involvement Pertinent to Current Situation/Hospitalization: No - Comment as needed  Activities of Daily Living Home Assistive Devices/Equipment: Wheelchair, Environmental consultant (specify type) ADL Screening (condition at time of admission) Patient's cognitive ability adequate to safely complete daily activities?: No Is the patient deaf or have difficulty hearing?: No Does the patient have difficulty seeing, even when wearing glasses/contacts?: No Does the patient have difficulty concentrating, remembering, or making decisions?: Yes Patient able to express need for assistance with ADLs?: Yes Does the patient have difficulty dressing or bathing?: Yes Independently performs ADLs?: No Does the patient have difficulty walking or climbing stairs?: Yes Weakness of Legs: Both Weakness of Arms/Hands: None  Permission Sought/Granted Permission sought to share information with : Case Manager, Family Supports Permission granted to share information with : Yes, Verbal Permission Granted     Permission granted to share info w AGENCY: [facility]  Emotional Assessment       Orientation: : Oriented to Self Alcohol / Substance Use: Not Applicable Psych Involvement: No (comment)  Admission diagnosis:  Fall, initial encounter [W19.XXXA] Closed  intertrochanteric fracture of hip, right, initial encounter [facility]) [S72.141A] Patient Active Problem List   Diagnosis Date Noted  . Hip fracture ([place]) 11/26/2018  . Fall at home, initial encounter 11/26/2018  . Leukocytosis 11/26/2018  . COPD (chronic obstructive pulmonary disease) ([place]) 11/26/2018  . Intertrochanteric fracture of right hip ([place]) 11/26/2018  . Closed intertrochanteric fracture of hip, right, initial encounter ([place])   . Dementia with aggressive behavior ([place]) 11/08/2018  . Iron deficiency anemia 07/15/2018  . Fall 06/09/2018  . Alcoholic dementia ([place]) [id number]  . Degenerative scoliosis 02/10/2018  . Diarrhea 10/14/2013  . Cigarette smoker 08/08/2011  . VAIN I (vaginal intraepithelial neoplasia grade I)   . COLONIC POLYPS 12/21/2007  . ALLERGIC RHINITIS 12/21/2007  . 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 HYPERCHOLESTEROLEMIA 07/05/2007  . 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 DERMATITIS 07/05/2007  . SHINGLES, HX OF 07/05/2007   PCP:  [name], MD Pharmacy:   [facility] [place], [place] [address] Phone: [phone] Fax: [phone]     Social Determinants of Health (SDOH) Interventions    Readmission Risk Interventions Readmission Risk Prevention Plan 11/28/2018  Transportation Screening Complete  PCP or Specialist Appt within 3-5 Days Not Complete  Not Complete comments Transitioning to [place] [facility]  [place] or Home Care Consult Complete  Social Work Consult for [place] Planning/Counseling Complete  Palliative Care Screening Not Applicable  Medication Review (RN Care Manager) Complete  Some recent data might be hidden

## 2018-11-28 NOTE — Progress Notes (Signed)
PROGRESS NOTE    Desiree Salinas  [id number] DOB: 1950-11-27 DOA: 11/25/2018 PCP: [name], MD    Brief Narrative:   Desiree Salinas is a 69 y.o. female with medical history significant of dementia, anemia, anxiety, arthritis, COPD, depression, hyperlipidemia presenting to the hospital via EMS from her skilled nursing facility for evaluation of right hip pain after a witnessed fall.  Patient has dementia and no history could be obtained from her.  She is oriented to self only and has no complaints.  Does not recall having a fall.  In the ED, no temperature recorded.  Systolic in the 0000000.  White count 12.7.  SARS-CoV-2 test negative.  Chest x-ray showing no focal consolidation.  CT head negative for acute finding.  X-ray of right hip and pelvis showing displaced and comminuted intertrochanteric fracture of the right femur.   Assessment & Plan:   Principal Problem:   Hip fracture [facility]) Active Problems:   Fall at home, initial encounter   Leukocytosis   COPD (chronic obstructive pulmonary disease) (HCC)   Closed intertrochanteric fracture of hip, right, initial encounter [facility])   Intertrochanteric fracture of right hip ([place])   Right hip fracture Patient presenting from SNF following witnessed fall.  X-ray right hip/pelvis notable for displaced and comminuted intertrochanteric fracture of the right femur.  Patient underwent IM nail by Dr.Duda on 11/26/2018.  Continue DVT prophylaxis with aspirin 325 mg p.o. daily per orthopedics x 21 days.  OT recommends SNF.  Continue pain control with Norco and morphine prn.  We will try to avoid IV narcotics as able.  Bowel regimen.  Plan to return to [facility].  Social work for coordination. --repeat administrative COVID test pending  Anemia, normocytic Acute postoperative blood loss anemia Hemoglobin on admission 12.7 with an MCV of 95.9.  --Hgb 12.7-->11.4-->9.7-->10.4; no signs of active bleeding, surgical dressing clean/dry/intact  --Continue monitor hemoglobin daily --DVT prophylaxis with aspirin as above  Dementia Anxiety/depression Continue home Adderall 15 mg p.o. daily, BuSpar 20 mg p.o. daily, Lexapro 20 mg p.o. daily, Seroquel 100 mg p.o. nightly. --avoid Haldol per family --ativan IV prn  GERD: Continue PPI   DVT prophylaxis: Aspirin, SCDs Code Status: Full code Family Communication: None present at bedside Disposition Plan: Plan return to [facility] SNF, pending repeat COVID-19 test   Consultants:   Orthopedics - Dr. Sharol Given  Procedures:   IM nail - Dr. Sharol Given - 9/23   Antimicrobials:   Perioperative antibiotics with Ancef   Subjective: Patient seen and examined at bedside, sitter present.  More calm this morning, feels that she is hungry.  Otherwise continues with confusion, which appears to be her baseline.  Nursing did note agitation overnight. Patient without any other complaints at this time.  Denies headache, no chest pain, no palpitations, no shortness of breath, no abdominal pain.  No other acute events overnight per nursing staff.  Objective: Vitals:   11/27/18 1009 11/27/18 2105 11/28/18 0355 11/28/18 0903  BP: 104/88 97/60 (!) 104/55 106/63  Pulse: 85 97 94 97  Resp: 14 16 18 17   Temp: 97.7 F (36.5 C) 99.4 F (37.4 C) 98.3 F (36.8 C) 99.4 F (37.4 C)  TempSrc: Oral Axillary Oral Oral  SpO2: 96% 97% 99% 96%  Weight:      Height:        Intake/Output Summary (Last 24 hours) at 11/28/2018 1340 Last data filed at 11/28/2018 0300 Gross per 24 hour  Intake 1790.83 ml  Output 300 ml  Net 1490.83 ml   Filed Weights   11/26/18 1454  Weight: 59.4 kg    Examination:  General exam: Appears calm and comfortable, sleeping but arousable Respiratory system: Clear to auscultation. Respiratory effort normal. Cardiovascular system: S1 & S2 heard, RRR. No JVD, murmurs, rubs, gallops or clicks. No pedal edema. Gastrointestinal system: Abdomen is nondistended, soft and  nontender. No organomegaly or masses felt. Normal bowel sounds heard. Central nervous system: Alert and oriented. No focal neurological deficits. Extremities: Moves all extremities independently, surgical dressing noted in place right hip, c/d/i Skin: No rashes, lesions or ulcers Psychiatry: Confused, depressed mood/flat affect    Data Reviewed: I have personally reviewed following labs and imaging studies  CBC: Recent Labs  Lab 11/25/18 2155 11/26/18 0257 11/27/18 0835 11/28/18 0339  WBC 12.7* 12.8* 10.1 10.2  NEUTROABS 10.4*  --   --   --   HGB 12.3 11.4* 9.7* 10.4*  HCT 37.1 35.4* 29.4* 30.8*  MCV 95.9 96.5 95.8 95.1  PLT 317 303 246 [id number]   Basic Metabolic Panel: Recent Labs  Lab 11/25/18 2155 11/27/18 0835 11/28/18 0339  NA 142 141 140  K 4.1 3.7 3.5  CL 112* 109 109  CO2 22 25 24   GLUCOSE 128* 123* 116*  BUN 10 8 10   CREATININE 0.86 0.59 0.73  CALCIUM 8.4* 8.0* 7.9*   GFR: Estimated Creatinine Clearance: 61.4 mL/min (by C-G formula based on SCr of 0.73 mg/dL). Liver Function Tests: Recent Labs  Lab 11/25/18 2155  AST 21  ALT 17  ALKPHOS 79  BILITOT 0.2*  PROT 6.0*  ALBUMIN 3.5   No results for input(s): LIPASE, AMYLASE in the last 168 hours. No results for input(s): AMMONIA in the last 168 hours. Coagulation Profile: No results for input(s): INR, PROTIME in the last 168 hours. Cardiac Enzymes: No results for input(s): CKTOTAL, CKMB, CKMBINDEX, TROPONINI in the last 168 hours. BNP (last 3 results) No results for input(s): PROBNP in the last 8760 hours. HbA1C: No results for input(s): HGBA1C in the last 72 hours. CBG: Recent Labs  Lab 11/25/18 2112  GLUCAP 111*   Lipid Profile: No results for input(s): CHOL, HDL, LDLCALC, TRIG, CHOLHDL, LDLDIRECT in the last 72 hours. Thyroid Function Tests: No results for input(s): TSH, T4TOTAL, FREET4, T3FREE, THYROIDAB in the last 72 hours. Anemia Panel: No results for input(s): VITAMINB12, FOLATE,  FERRITIN, TIBC, IRON, RETICCTPCT in the last 72 hours. Sepsis Labs: No results for input(s): PROCALCITON, LATICACIDVEN in the last 168 hours.  Recent Results (from the past 240 hour(s))  SARS Coronavirus 2 [facility] order, Performed in [facility] hospital lab) Nasopharyngeal Nasopharyngeal Swab     Status: None   Collection Time: 11/25/18 11:02 PM   Specimen: Nasopharyngeal Swab  Result Value Ref Range Status   SARS Coronavirus 2 NEGATIVE NEGATIVE Final    Comment: (NOTE) If result is NEGATIVE SARS-CoV-2 target nucleic acids are NOT DETECTED. The SARS-CoV-2 RNA is generally detectable in upper and lower  respiratory specimens during the acute phase of infection. The lowest  concentration of SARS-CoV-2 viral copies this assay can detect is 250  copies / mL. A negative result does not preclude SARS-CoV-2 infection  and should not be used as the sole basis for treatment or other  patient management decisions.  A negative result may occur with  improper specimen collection / handling, submission of specimen other  than nasopharyngeal swab, presence of viral mutation(s) within the  areas targeted by this assay, and inadequate number of viral  copies  (<250 copies / mL). A negative result must be combined with clinical  observations, patient history, and epidemiological information. If result is POSITIVE SARS-CoV-2 target nucleic acids are DETECTED. The SARS-CoV-2 RNA is generally detectable in upper and lower  respiratory specimens dur ing the acute phase of infection.  Positive  results are indicative of active infection with SARS-CoV-2.  Clinical  correlation with patient history and other diagnostic information is  necessary to determine patient infection status.  Positive results do  not rule out bacterial infection or co-infection with other viruses. If result is PRESUMPTIVE POSTIVE SARS-CoV-2 nucleic acids MAY BE PRESENT.   A presumptive positive result was obtained on the  submitted specimen  and confirmed on repeat testing.  While 2019 novel coronavirus  (SARS-CoV-2) nucleic acids may be present in the submitted sample  additional confirmatory testing may be necessary for epidemiological  and / or clinical management purposes  to differentiate between  SARS-CoV-2 and other Sarbecovirus currently known to infect humans.  If clinically indicated additional testing with an alternate test  methodology [phone]) is advised. The SARS-CoV-2 RNA is generally  detectable in upper and lower respiratory sp ecimens during the acute  phase of infection. The expected result is Negative. Fact Sheet for Patients:  StrictlyIdeas.no Fact Sheet for Healthcare Providers: BankingDealers.co.za This test is not yet approved or cleared by the [place] FDA and has been authorized for detection and/or diagnosis of SARS-CoV-2 by FDA under an Emergency Use Authorization (EUA).  This EUA will remain in effect (meaning this test can be used) for the duration of the COVID-19 declaration under Section 564(b)(1) of the Act, 21 U.S.C. section 360bbb-3(b)(1), unless the authorization is terminated or revoked sooner. Performed at [facility], [place] [address] [place], [place] [address]          Radiology Studies: No results found.      Scheduled Meds: . amphetamine-dextroamphetamine  15 mg Oral Daily  . aspirin EC  325 mg Oral Q breakfast  . busPIRone  20 mg Oral Daily  . docusate sodium  100 mg Oral BID  . escitalopram  20 mg Oral Daily  . influenza vaccine adjuvanted  0.5 mL Intramuscular Tomorrow-1000  . pantoprazole  40 mg Oral Daily  . QUEtiapine  100 mg Oral QHS  . vitamin C  500 mg Oral Daily   Continuous Infusions: . sodium chloride Stopped (11/27/18 1618)     LOS: 2 days    Time spent: 29 minutes spent on chart review, discussion with nursing staff, consultants, updating family and  interview/physical exam; more than 50% of that time was spent in counseling and/or coordination of care.    Eric J [place] DO [facility] Pager [phone]  If 7PM-7AM, please contact night-coverage [email or web address] Password [email or web address] 11/28/2018, 1:40 PM

## 2018-11-29 NOTE — Plan of Care (Signed)
  Problem: Safety: Goal: Ability to remain free from injury will improve Outcome: Progressing   

## 2018-11-29 NOTE — Progress Notes (Signed)
PROGRESS NOTE    Desiree Salinas  [id number] DOB: 14-Aug-1950 DOA: 11/25/2018 PCP: [name], MD    Brief Narrative:   Desiree Salinas is a 68 y.o. female with medical history significant of dementia, anemia, anxiety, arthritis, COPD, depression, hyperlipidemia presenting to the hospital via EMS from her skilled nursing facility for evaluation of right hip pain after a witnessed fall.  Patient has dementia and no history could be obtained from her.  She is oriented to self only and has no complaints.  Does not recall having a fall.  In the ED, no temperature recorded.  Systolic in the 0000000.  White count 12.7.  SARS-CoV-2 test negative.  Chest x-ray showing no focal consolidation.  CT head negative for acute finding.  X-ray of right hip and pelvis showing displaced and comminuted intertrochanteric fracture of the right femur.  11/09/2018: Patient seen.  Patient is resting quietly.  No significant history from patient.  Repeat COVID-19 testing is pending.  Patient is awaiting discharge to skilled nursing facility.  No behavioral problems reported.  Patient was seen alongside patient's nurse and paid caregiver.   Assessment & Plan:   Principal Problem:   Hip fracture [facility]) Active Problems:   Fall at home, initial encounter   Leukocytosis   COPD (chronic obstructive pulmonary disease) (HCC)   Closed intertrochanteric fracture of hip, right, initial encounter [facility])   Intertrochanteric fracture of right hip ([place])   Right hip fracture Patient presenting from SNF following witnessed fall.  X-ray right hip/pelvis notable for displaced and comminuted intertrochanteric fracture of the right femur.  Patient underwent IM nail by Dr.Duda on 11/26/2018.  Continue DVT prophylaxis with aspirin 325 mg p.o. daily per orthopedics x 21 days.  OT recommends SNF.  Continue pain control with Norco and morphine prn.  We will try to avoid IV narcotics as able.  Bowel regimen.  Plan to return to [place].   Social work for coordination. --repeat administrative COVID test pending 11/29/2018: Follow-up COVID 19 test-pending.  Pursue disposition afterwards.  Anemia, normocytic Acute postoperative blood loss anemia Hemoglobin on admission 12.7 with an MCV of 95.9.  --Hgb 12.7-->11.4-->9.7-->10.4; no signs of active bleeding, surgical dressing clean/dry/intact --Continue monitor hemoglobin daily --DVT prophylaxis with aspirin as above  Dementia Anxiety/depression Continue home Adderall 15 mg p.o. daily, BuSpar 20 mg p.o. daily, Lexapro 20 mg p.o. daily, Seroquel 100 mg p.o. nightly. --avoid Haldol per family --ativan IV prn 11/29/2018: No behavioral problems reported.  Will avoid benzodiazepines.  GERD: Continue PPI   DVT prophylaxis: Aspirin, SCDs Code Status: Full code Family Communication: None present at bedside Disposition Plan: Plan return to [place] [facility]F, pending repeat COVID-19 test   Consultants:   Orthopedics - Dr. Sharol Given  Procedures:   IM nail - Dr. Sharol Given - 9/23   Antimicrobials:   Perioperative antibiotics with Ancef   Subjective: Patient is resting quietly. No new complaints.  Objective: Vitals:   11/28/18 1942 11/29/18 0435 11/29/18 0833 11/29/18 1434  BP: (!) 115/56 (!) 102/56 (!) 94/58 (!) 101/56  Pulse: 97 90 93 95  Resp: 20 18 15    Temp: 98.4 F (36.9 C) (!) 97.3 F (36.3 C) 98.4 F (36.9 C)   TempSrc: Oral Oral Oral   SpO2: 99% 95% 94%   Weight:      Height:        Intake/Output Summary (Last 24 hours) at 11/29/2018 1454 Last data filed at 11/29/2018 1436 Gross per 24 hour  Intake 720 ml  Output 1100 ml  Net -380 ml   Filed Weights   11/26/18 1454  Weight: 59.4 kg    Examination:  General exam: Appears calm and comfortable.  Resting quietly. Respiratory system: Clear to auscultation. Respiratory effort normal. Cardiovascular system: S1 & S2 heard Gastrointestinal system: Abdomen is nondistended, soft and nontender. No  organomegaly or masses felt. Normal bowel sounds heard. Central nervous system: Awake and alert.  Moves all extremities.   Extremities: No ankle edema.  Data Reviewed: I have personally reviewed following labs and imaging studies  CBC: Recent Labs  Lab 11/25/18 2155 11/26/18 0257 11/27/18 0835 11/28/18 0339  WBC 12.7* 12.8* 10.1 10.2  NEUTROABS 10.4*  --   --   --   HGB 12.3 11.4* 9.7* 10.4*  HCT 37.1 35.4* 29.4* 30.8*  MCV 95.9 96.5 95.8 95.1  PLT 317 303 246 [id number]   Basic Metabolic Panel: Recent Labs  Lab 11/25/18 2155 11/27/18 0835 11/28/18 0339  NA 142 141 140  K 4.1 3.7 3.5  CL 112* 109 109  CO2 22 25 24   GLUCOSE 128* 123* 116*  BUN 10 8 10   CREATININE 0.86 0.59 0.73  CALCIUM 8.4* 8.0* 7.9*   GFR: Estimated Creatinine Clearance: 61.4 mL/min (by C-G formula based on SCr of 0.73 mg/dL). Liver Function Tests: Recent Labs  Lab 11/25/18 2155  AST 21  ALT 17  ALKPHOS 79  BILITOT 0.2*  PROT 6.0*  ALBUMIN 3.5   No results for input(s): LIPASE, AMYLASE in the last 168 hours. No results for input(s): AMMONIA in the last 168 hours. Coagulation Profile: No results for input(s): INR, PROTIME in the last 168 hours. Cardiac Enzymes: No results for input(s): CKTOTAL, CKMB, CKMBINDEX, TROPONINI in the last 168 hours. BNP (last 3 results) No results for input(s): PROBNP in the last 8760 hours. HbA1C: No results for input(s): HGBA1C in the last 72 hours. CBG: Recent Labs  Lab 11/25/18 2112  GLUCAP 111*   Lipid Profile: No results for input(s): CHOL, HDL, LDLCALC, TRIG, CHOLHDL, LDLDIRECT in the last 72 hours. Thyroid Function Tests: No results for input(s): TSH, T4TOTAL, FREET4, T3FREE, THYROIDAB in the last 72 hours. Anemia Panel: No results for input(s): VITAMINB12, FOLATE, FERRITIN, TIBC, IRON, RETICCTPCT in the last 72 hours. Sepsis Labs: No results for input(s): PROCALCITON, LATICACIDVEN in the last 168 hours.  Recent Results (from the past 240 hour(s))    SARS Coronavirus 2 [facility] order, Performed in [facility] hospital lab) Nasopharyngeal Nasopharyngeal Swab     Status: None   Collection Time: 11/25/18 11:02 PM   Specimen: Nasopharyngeal Swab  Result Value Ref Range Status   SARS Coronavirus 2 NEGATIVE NEGATIVE Final    Comment: (NOTE) If result is NEGATIVE SARS-CoV-2 target nucleic acids are NOT DETECTED. The SARS-CoV-2 RNA is generally detectable in upper and lower  respiratory specimens during the acute phase of infection. The lowest  concentration of SARS-CoV-2 viral copies this assay can detect is 250  copies / mL. A negative result does not preclude SARS-CoV-2 infection  and should not be used as the sole basis for treatment or other  patient management decisions.  A negative result may occur with  improper specimen collection / handling, submission of specimen other  than nasopharyngeal swab, presence of viral mutation(s) within the  areas targeted by this assay, and inadequate number of viral copies  (<250 copies / mL). A negative result must be combined with clinical  observations, patient history, and epidemiological information. If result is POSITIVE SARS-CoV-2  target nucleic acids are DETECTED. The SARS-CoV-2 RNA is generally detectable in upper and lower  respiratory specimens dur ing the acute phase of infection.  Positive  results are indicative of active infection with SARS-CoV-2.  Clinical  correlation with patient history and other diagnostic information is  necessary to determine patient infection status.  Positive results do  not rule out bacterial infection or co-infection with other viruses. If result is PRESUMPTIVE POSTIVE SARS-CoV-2 nucleic acids MAY BE PRESENT.   A presumptive positive result was obtained on the submitted specimen  and confirmed on repeat testing.  While 2019 novel coronavirus  (SARS-CoV-2) nucleic acids may be present in the submitted sample  additional confirmatory testing may be  necessary for epidemiological  and / or clinical management purposes  to differentiate between  SARS-CoV-2 and other Sarbecovirus currently known to infect humans.  If clinically indicated additional testing with an alternate test  methodology [id number]) is advised. The SARS-CoV-2 RNA is generally  detectable in upper and lower respiratory sp ecimens during the acute  phase of infection. The expected result is Negative. Fact Sheet for Patients:  StrictlyIdeas.no Fact Sheet for Healthcare Providers: BankingDealers.co.za This test is not yet approved or cleared by the [place] FDA and has been authorized for detection and/or diagnosis of SARS-CoV-2 by FDA under an Emergency Use Authorization (EUA).  This EUA will remain in effect (meaning this test can be used) for the duration of the COVID-19 declaration under Section 564(b)(1) of the Act, 21 U.S.C. section 360bbb-3(b)(1), unless the authorization is terminated or revoked sooner. Performed at [facility], [place] [address], [place], [place] [address]          Radiology Studies: No results found.      Scheduled Meds:  amphetamine-dextroamphetamine  15 mg Oral Daily   aspirin EC  325 mg Oral Q breakfast   busPIRone  20 mg Oral Daily   docusate sodium  100 mg Oral BID   escitalopram  20 mg Oral Daily   influenza vaccine adjuvanted  0.5 mL Intramuscular Tomorrow-1000   pantoprazole  40 mg Oral Daily   QUEtiapine  100 mg Oral QHS   vitamin C  500 mg Oral Daily   Continuous Infusions:  sodium chloride Stopped (11/27/18 1618)     LOS: 3 days    Bonnell Public, MD [facility]  If 7PM-7AM, please contact night-coverage [email or web address] Password [facility] 11/29/2018, 2:54 PM

## 2018-11-30 LAB — RENAL FUNCTION PANEL
Albumin: 2.3 g/dL — ABNORMAL LOW (ref 3.5–5.0)
Anion gap: 11 (ref 5–15)
BUN: 11 mg/dL (ref 8–23)
CO2: 23 mmol/L (ref 22–32)
Calcium: 7.9 mg/dL — ABNORMAL LOW (ref 8.9–10.3)
Chloride: 107 mmol/L (ref 98–111)
Creatinine, Ser: 0.66 mg/dL (ref 0.44–1.00)
GFR calc Af Amer: >60 mL/min (ref >60)
GFR calc non Af Amer: >60 mL/min (ref >60)
Glucose, Bld: 114 mg/dL — ABNORMAL HIGH (ref 70–99)
Phosphorus: 4 mg/dL (ref 2.5–4.6)
Potassium: 3.6 mmol/L (ref 3.5–5.1)
Sodium: 141 mmol/L (ref 135–145)

## 2018-11-30 LAB — NOVEL CORONAVIRUS, NAA (HOSP ORDER, SEND-OUT TO REF LAB; TAT 18-24 HRS): SARS-CoV-2, NAA: NOT DETECTED

## 2018-11-30 LAB — SARS CORONAVIRUS 2 (TAT 6-24 HRS): SARS Coronavirus 2: NEGATIVE

## 2018-11-30 NOTE — Progress Notes (Addendum)
     Subjective: 4 Days Post-Op Procedure(s) (LRB): INTRAMEDULLARY (IM) NAIL INTERTROCHANTRIC RIGHT HIP (Right) Demetia, left lateral decubitus position in bed.  From nursing facility and at discharge will need an increase in care to SNF level.  Patient reports pain as moderate.    Objective:   VITALS:  Temp:  [97.3 F (36.3 C)-99.5 F (37.5 C)] 99.5 F (37.5 C) (09/27 0808) Pulse Rate:  [89-95] 89 (09/27 0808) Resp:  [16-20] 17 (09/27 0808) BP: (90-122)/(52-93) 99/58 (09/27 0808) SpO2:  [90 %-98 %] 96 % (09/27 [id number])  Neurologically intact ABD soft Neurovascular intact Sensation intact distally Intact pulses distally Dorsiflexion/Plantar flexion intact Incision: scant drainage   LABS Recent Labs    11/28/18 0339  HGB 10.4*  WBC 10.2  PLT 253   Recent Labs    11/28/18 0339 11/30/18 0305  NA 140 141  K 3.5 3.6  CL 109 107  CO2 24 23  BUN 10 11  CREATININE 0.73 0.66  GLUCOSE 116* 114*   No results for input(s): LABPT, INR in the last 72 hours.   Assessment/Plan: 4 Days Post-Op Procedure(s) (LRB): INTRAMEDULLARY (IM) NAIL INTERTROCHANTRIC RIGHT HIP (Right) Stable orthopaedically for discharge to SNF.  Advance diet Discharge to SNF  Basil Dess 11/30/2018, 2:01 PMPatient ID: Desiree Salinas, female   DOB: [birth year], 68 y.o.   MRN: [id number]

## 2018-11-30 NOTE — Plan of Care (Signed)
  Problem: Health Behavior/Discharge Planning: Goal: Ability to manage health-related needs will improve Outcome: Progressing   Problem: Clinical Measurements: Goal: Ability to maintain clinical measurements within normal limits will improve Outcome: Progressing Goal: Will remain free from infection Outcome: Progressing   

## 2018-11-30 NOTE — Plan of Care (Signed)
  Problem: Pain Managment: Goal: General experience of comfort will improve Outcome: Progressing   Problem: Safety: Goal: Ability to remain free from injury will improve Outcome: Progressing   Problem: Skin Integrity: Goal: Risk for impaired skin integrity will decrease Outcome: Progressing   

## 2018-11-30 NOTE — Progress Notes (Signed)
PROGRESS NOTE    [name]  [id number] DOB: 02-16-1951 DOA: 11/25/2018 PCP: [name], MD    Brief Narrative:   Desiree Salinas is a 68 y.o. female with medical history significant of dementia, anemia, anxiety, arthritis, COPD, depression, hyperlipidemia presenting to the hospital via EMS from her skilled nursing facility for evaluation of right hip pain after a witnessed fall.  Patient has dementia and no history could be obtained from her.  She is oriented to self only and has no complaints.  Does not recall having a fall.  In the ED, no temperature recorded.  Systolic in the 0000000.  White count 12.7.  SARS-CoV-2 test negative.  Chest x-ray showing no focal consolidation.  CT head negative for acute finding.  X-ray of right hip and pelvis showing displaced and comminuted intertrochanteric fracture of the right femur.  11/09/2018: Patient seen.  Patient is resting quietly.  No significant history from patient.  Repeat COVID-19 testing is pending.  Patient is awaiting discharge to skilled nursing facility.  No behavioral problems reported.  Patient was seen alongside patient's nurse and paid caregiver.  11/30/2018: No new changes.  Pursue disposition.   Assessment & Plan:   Principal Problem:   Hip fracture [facility]) Active Problems:   Fall at home, initial encounter   Leukocytosis   COPD (chronic obstructive pulmonary disease) (HCC)   Closed intertrochanteric fracture of hip, right, initial encounter [facility])   Intertrochanteric fracture of right hip ([place])   Right hip fracture Patient presenting from SNF following witnessed fall.  X-ray right hip/pelvis notable for displaced and comminuted intertrochanteric fracture of the right femur.  Patient underwent IM nail by Dr.Duda on 11/26/2018.  Continue DVT prophylaxis with aspirin 325 mg p.o. daily per orthopedics x 21 days.  OT recommends SNF.  Continue pain control with Norco and morphine prn.  We will try to avoid IV narcotics as able.   Bowel regimen.  Plan to return to [place].  Social work for coordination. --repeat administrative COVID test pending 11/30/2018: Follow-up COVID 19 test-pending.  Pursue disposition afterwards.  Anemia, normocytic Acute postoperative blood loss anemia Hemoglobin on admission 12.7 with an MCV of 95.9.  --Hgb 12.7-->11.4-->9.7-->10.4; no signs of active bleeding, surgical dressing clean/dry/intact --Continue monitor hemoglobin daily --DVT prophylaxis with aspirin as above  Dementia Anxiety/depression Continue home Adderall 15 mg p.o. daily, BuSpar 20 mg p.o. daily, Lexapro 20 mg p.o. daily, Seroquel 100 mg p.o. nightly. --avoid Haldol per family --ativan IV prn 11/30/2018: No behavioral problems reported.  Will avoid benzodiazepines.  GERD: Continue PPI   DVT prophylaxis: Aspirin, SCDs Code Status: Full code Family Communication: None present at bedside Disposition Plan: Plan return to [place] SNF, pending repeat COVID-19 test   Consultants:   Orthopedics - Dr. Sharol Given  Procedures:   IM nail - Dr. Sharol Given - 9/23   Antimicrobials:   Perioperative antibiotics with Ancef   Subjective: Patient is resting quietly. No new complaints.  Objective: Vitals:   11/29/18 2024 11/30/18 0319 11/30/18 0808 11/30/18 1513  BP: (!) 122/93 (!) 90/52 (!) 99/58 (!) 95/58  Pulse: 95 91 89 84  Resp: 20 16 17 15   Temp: 98.4 F (36.9 C) (!) 97.3 F (36.3 C) 99.5 F (37.5 C) 98.3 F (36.8 C)  TempSrc: Oral Oral Oral Oral  SpO2: 98% 95% 96% 97%  Weight:      Height:        Intake/Output Summary (Last 24 hours) at 11/30/2018 1539 Last data filed at  11/30/2018 1200 Gross per 24 hour  Intake 600 ml  Output 500 ml  Net 100 ml   Filed Weights   11/26/18 1454  Weight: 59.4 kg    Examination:  General exam: Appears calm and comfortable.  Resting quietly. Respiratory system: Clear to auscultation. Respiratory effort normal. Cardiovascular system: S1 & S2  heard Gastrointestinal system: Abdomen is nondistended, soft and nontender. No organomegaly or masses felt. Normal bowel sounds heard. Central nervous system: Awake and alert.  Moves all extremities.   Extremities: No ankle edema.  Data Reviewed: I have personally reviewed following labs and imaging studies  CBC: Recent Labs  Lab 11/25/18 2155 11/26/18 0257 11/27/18 0835 11/28/18 0339  WBC 12.7* 12.8* 10.1 10.2  NEUTROABS 10.4*  --   --   --   HGB 12.3 11.4* 9.7* 10.4*  HCT 37.1 35.4* 29.4* 30.8*  MCV 95.9 96.5 95.8 95.1  PLT 317 303 246 [id number]   Basic Metabolic Panel: Recent Labs  Lab 11/25/18 2155 11/27/18 0835 11/28/18 0339 11/30/18 0305  NA 142 141 140 141  K 4.1 3.7 3.5 3.6  CL 112* 109 109 107  CO2 22 25 24 23   GLUCOSE 128* 123* 116* 114*  BUN 10 8 10 11   CREATININE 0.86 0.59 0.73 0.66  CALCIUM 8.4* 8.0* 7.9* 7.9*  PHOS  --   --   --  4.0   GFR: Estimated Creatinine Clearance: 61.4 mL/min (by C-G formula based on SCr of 0.66 mg/dL). Liver Function Tests: Recent Labs  Lab 11/25/18 2155 11/30/18 0305  AST 21  --   ALT 17  --   ALKPHOS 79  --   BILITOT 0.2*  --   PROT 6.0*  --   ALBUMIN 3.5 2.3*   No results for input(s): LIPASE, AMYLASE in the last 168 hours. No results for input(s): AMMONIA in the last 168 hours. Coagulation Profile: No results for input(s): INR, PROTIME in the last 168 hours. Cardiac Enzymes: No results for input(s): CKTOTAL, CKMB, CKMBINDEX, TROPONINI in the last 168 hours. BNP (last 3 results) No results for input(s): PROBNP in the last 8760 hours. HbA1C: No results for input(s): HGBA1C in the last 72 hours. CBG: Recent Labs  Lab 11/25/18 2112  GLUCAP 111*   Lipid Profile: No results for input(s): CHOL, HDL, LDLCALC, TRIG, CHOLHDL, LDLDIRECT in the last 72 hours. Thyroid Function Tests: No results for input(s): TSH, T4TOTAL, FREET4, T3FREE, THYROIDAB in the last 72 hours. Anemia Panel: No results for input(s): VITAMINB12,  FOLATE, FERRITIN, TIBC, IRON, RETICCTPCT in the last 72 hours. Sepsis Labs: No results for input(s): PROCALCITON, LATICACIDVEN in the last 168 hours.  Recent Results (from the past 240 hour(s))  SARS Coronavirus 2 [facility] order, Performed in [facility] hospital lab) Nasopharyngeal Nasopharyngeal Swab     Status: None   Collection Time: 11/25/18 11:02 PM   Specimen: Nasopharyngeal Swab  Result Value Ref Range Status   SARS Coronavirus 2 NEGATIVE NEGATIVE Final    Comment: (NOTE) If result is NEGATIVE SARS-CoV-2 target nucleic acids are NOT DETECTED. The SARS-CoV-2 RNA is generally detectable in upper and lower  respiratory specimens during the acute phase of infection. The lowest  concentration of SARS-CoV-2 viral copies this assay can detect is 250  copies / mL. A negative result does not preclude SARS-CoV-2 infection  and should not be used as the sole basis for treatment or other  patient management decisions.  A negative result may occur with  improper specimen collection / handling, submission  of specimen other  than nasopharyngeal swab, presence of viral mutation(s) within the  areas targeted by this assay, and inadequate number of viral copies  (<250 copies / mL). A negative result must be combined with clinical  observations, patient history, and epidemiological information. If result is POSITIVE SARS-CoV-2 target nucleic acids are DETECTED. The SARS-CoV-2 RNA is generally detectable in upper and lower  respiratory specimens dur ing the acute phase of infection.  Positive  results are indicative of active infection with SARS-CoV-2.  Clinical  correlation with patient history and other diagnostic information is  necessary to determine patient infection status.  Positive results do  not rule out bacterial infection or co-infection with other viruses. If result is PRESUMPTIVE POSTIVE SARS-CoV-2 nucleic acids MAY BE PRESENT.   A presumptive positive result was obtained on the  submitted specimen  and confirmed on repeat testing.  While 2019 novel coronavirus  (SARS-CoV-2) nucleic acids may be present in the submitted sample  additional confirmatory testing may be necessary for epidemiological  and / or clinical management purposes  to differentiate between  SARS-CoV-2 and other Sarbecovirus currently known to infect humans.  If clinically indicated additional testing with an alternate test  methodology [phone]) is advised. The SARS-CoV-2 RNA is generally  detectable in upper and lower respiratory sp ecimens during the acute  phase of infection. The expected result is Negative. Fact Sheet for Patients:  StrictlyIdeas.no Fact Sheet for Healthcare Providers: BankingDealers.co.za This test is not yet approved or cleared by the [place] FDA and has been authorized for detection and/or diagnosis of SARS-CoV-2 by FDA under an Emergency Use Authorization (EUA).  This EUA will remain in effect (meaning this test can be used) for the duration of the COVID-19 declaration under Section 564(b)(1) of the Act, 21 U.S.C. section 360bbb-3(b)(1), unless the authorization is terminated or revoked sooner. Performed at [facility], [place] [address] [place], [place] [address]   Novel Coronavirus, NAA (hospital order; send-out to ref lab)     Status: None   Collection Time: 11/28/18 11:52 PM   Specimen: Nasopharyngeal Swab; Respiratory  Result Value Ref Range Status   SARS-CoV-2, NAA NOT DETECTED NOT DETECTED Final    Comment: (NOTE) This nucleic acid amplification test was developed and its performance characteristics determined by [employer]. Nucleic acid amplification tests include PCR and TMA. This test has not been FDA cleared or approved. This test has been authorized by FDA under an Emergency Use Authorization (EUA). This test is only authorized for the duration of time the declaration  that circumstances exist justifying the authorization of the emergency use of in vitro diagnostic tests for detection of SARS-CoV-2 virus and/or diagnosis of COVID-19 infection under section 564(b)(1) of the Act, 21 U.S.C. [id number]) (1), unless the authorization is terminated or revoked sooner. When diagnostic testing is negative, the possibility of a false negative result should be considered in the context of a patient's recent exposures and the presence of clinical signs and symptoms consistent with COVID-19. An individual without symptoms of COVID- 19 and who is not shedding SARS-CoV-2 vi rus would expect to have a negative (not detected) result in this assay. Performed At: [facility] [address] [place], [place] [id number] Rush Farmer MD [id number]    [place]  Final    Comment: Performed at [facility], [place] [address], [place], [place] [address]         Radiology Studies: No results found.  Scheduled Meds:  amphetamine-dextroamphetamine  15 mg Oral Daily   aspirin EC  325 mg Oral Q breakfast   busPIRone  20 mg Oral Daily   docusate sodium  100 mg Oral BID   escitalopram  20 mg Oral Daily   influenza vaccine adjuvanted  0.5 mL Intramuscular Tomorrow-1000   pantoprazole  40 mg Oral Daily   QUEtiapine  100 mg Oral QHS   vitamin C  500 mg Oral Daily   Continuous Infusions:  sodium chloride Stopped (11/27/18 1618)     LOS: 4 days    Bonnell Public, MD [facility]  If 7PM-7AM, please contact night-coverage [email or web address] Password [facility] 11/30/2018, 3:39 PM

## 2018-12-01 MED ORDER — QUETIAPINE FUMARATE 25 MG PO TABS: 25.0000 mg | ORAL_TABLET | Freq: Two times a day (BID) | ORAL | 0 refills | Status: DC

## 2018-12-01 MED ORDER — ASPIRIN 325 MG PO TBEC: 325.0000 mg | DELAYED_RELEASE_TABLET | Freq: Every day | ORAL | 0 refills | Status: DC

## 2018-12-01 MED ORDER — HYDROCODONE-ACETAMINOPHEN 7.5-325 MG PO TABS: 1.0000 | ORAL_TABLET | ORAL | 0 refills | Status: DC | PRN

## 2018-12-01 MED ORDER — DOCUSATE SODIUM 100 MG PO CAPS: 100.0000 mg | ORAL_CAPSULE | Freq: Two times a day (BID) | ORAL | 0 refills | Status: DC

## 2018-12-01 MED ORDER — QUETIAPINE FUMARATE 100 MG PO TABS: 100.0000 mg | ORAL_TABLET | Freq: Every day | ORAL | 0 refills | Status: DC

## 2018-12-01 MED ORDER — BISACODYL 5 MG PO TBEC: 5.0000 mg | DELAYED_RELEASE_TABLET | Freq: Every day | ORAL | 0 refills | Status: DC | PRN

## 2018-12-01 NOTE — TOC Transition Note (Addendum)
Transition of Care [facility]) - CM/SW Discharge Note   Patient Details  Name: Desiree Salinas MRN: [id number] Date of Birth: 1950/12/30  Transition of Care [facility]) CM/SW Contact:  Midge Minium RN, BSN, NCM-BC, ACM-RN [phone] Phone Number: 12/01/2018, 12:08 PM   Clinical Narrative:    Patient is medically stable to transition back to [facility]. CM spoke to [place] (Admissions); patients COVID results were received and the hospital bed has been assembled. PTAR will be arranged for 1400; pending DC summary and signed prescriptions for any psychotropics/narcotics. CM spoke to patients sister/POA with an update provided on the POC.   Please call report to: [phone]; PTAR arranged for 1430; DC Summary has been faxed.   Final next level of care: Memory Care Barriers to Discharge: No Barriers Identified   Patient Goals and CMS Choice Patient states their goals for this hospitalization and ongoing recovery are:: "family states to receive her rehab and good care" CMS Medicare.gov Compare Post Acute Care list provided to:: Patient Represenative (must comment) Choice offered to / list presented to : [facility] POA / Guardian([facility]))  Discharge Placement                Patient to be transferred to facility by: [place] Name of family member notified: [name] (sister/POA) Patient and family notified of of transfer: 12/01/18  Discharge Plan and Services In-house Referral: NA Discharge Planning Services: CM Consult Post Acute Care Choice: Durable Medical Equipment(Assisted Living Memory Care)          DME Arranged: Hospital bed DME Agency: [place] Date DME Agency Contacted: 11/28/18 Time DME Agency Contacted: [phone] Representative spoke with at DME Agency: Learta Codding (liaison) [place] Arranged: NA [place] Agency: NA        Social Determinants of Health ([place]) Interventions     Readmission Risk Interventions Readmission Risk Prevention Plan 11/28/2018  Transportation Screening  Complete  PCP or Specialist Appt within 3-5 Days Not Complete  Not Complete comments Transitioning to [place] Unit  [place] or [place] Complete  Social Work Consult for [place] Planning/Counseling Complete  Palliative Care Screening Not Applicable  Medication Review Press [employer]) Complete  Some recent data might be hidden

## 2018-12-01 NOTE — Plan of Care (Signed)
  Problem: Clinical Measurements: Goal: Will remain free from infection Outcome: Progressing   Problem: Activity: Goal: Risk for activity intolerance will decrease Outcome: Progressing   Problem: Safety: Goal: Ability to remain free from injury will improve Outcome: Progressing   Problem: Skin Integrity: Goal: Risk for impaired skin integrity will decrease Outcome: Progressing   

## 2018-12-01 NOTE — Progress Notes (Signed)
Report called to [facility] @ [facility] in [facility].

## 2018-12-01 NOTE — Care Management Important Message (Signed)
Important Message  Patient Details  Name: Desiree Salinas MRN: [id number] Date of Birth: 1951-01-25   Medicare Important Message Given:  Yes     [name] 12/01/2018, 2:41 PM

## 2018-12-01 NOTE — Progress Notes (Signed)
Dr Marthenia Rolling has been notified of the arrangement for PTAR to pick up at 2 pm.  We have requested discharge order and reconciliation to be completed.

## 2018-12-01 NOTE — Discharge Summary (Signed)
Physician Discharge Summary  Patient ID: Desiree Salinas MRN: [id number] DOB/AGE: March 18, 1950 68 y.o.  Admit date: 11/25/2018 Discharge date: 12/01/2018  Admission Diagnoses:  Discharge Diagnoses:  Principal Problem:   Hip fracture [facility]) Active Problems:   Fall at home, initial encounter   Leukocytosis   COPD (chronic obstructive pulmonary disease) ([place])   Closed intertrochanteric fracture of hip, right, initial encounter [facility])   Intertrochanteric fracture of right hip [facility])   Discharged Condition: stable  Hospital Course: Patient is a 68 year old Caucasian female with past medical history significant for dementia, anemia, anxiety, arthritis, COPD, depression and hyperlipidemia.  Patient presented to the hospital from skilled nursing facilityfor evaluation of right hip painfollowing a witnessed fall. X-ray of right hip and pelvis done on presentation revealed displaced and comminuted intertrochanteric fracture of the right femur.  Patient was admitted for further assessment and management.  The orthopedic team directed patient's care.  Patient underwent intramedullary nailing of the right hip intertrochanteric fracture.  Orthopedic team is cleared patient for discharge.  Patient be discharged back to the skilled nursing facility.  Right hip fracture: - X-ray right hip/pelvis was notable for displaced and comminuted intertrochanteric fracture of the right femur.   -Patient underwent intramedullary nailing of the right hip fracture by Dr.Duda, orthopedic surgeon, on 11/26/2018.   -Aspirin 325 mg p.o. daily for 21 days has been advised for DVT prophylaxis.   -Orthopedic team directed patient's care.   -Patient has been cleared for discharge back to skilled nursing facility.  Anemia: -Acute postoperative blood loss anemia -Admission, hemoglobin was 12.7 g/dL and, prior to discharge, hemoglobin was 10.4 g/dL.   --Continue monitor hemoglobin   Dementia: -Stable.  No behavioral problems  noted prior to discharge. -Continue preadmission medication.  Anxiety/depression: -Stable. -Continue preadmission medication.    GERD: Continue PPI  Consults: orthopedic surgery  Treatments: INTRAMEDULLARY (IM) NAIL INTERTROCHANTRIC RIGHT HIP  Discharge Exam: Blood pressure (!) 113/49, pulse 91, temperature 98.8 F (37.1 C), temperature source Oral, resp. rate 16, height 5\' 5"  (1.651 m), weight 59.4 kg, SpO2 95 %.   Disposition: Discharge disposition: 03-Skilled Nursing Facility   Discharge Instructions    Diet - low sodium heart healthy   Complete by: As directed    Increase activity slowly   Complete by: As directed    Weight bearing as tolerated   Complete by: As directed    Laterality: right   Extremity: Lower     Allergies as of 12/01/2018      Reactions   Latex    Nickel Hives   Hives, itching, weeping    [place] Rash   Eucerin [basis Facial Moisturizer] Hives, Itching, Rash   eucerin lotion causes an allergic skin reaction      Medication List    STOP taking these medications   acetaminophen 325 MG tablet Commonly known as: TYLENOL   acetaminophen 500 MG tablet Commonly known as: TYLENOL   aluminum-magnesium hydroxide 200-200 MG/5ML suspension   guaiFENesin 100 MG/5ML Soln Commonly known as: ROBITUSSIN   loperamide 2 MG capsule Commonly known as: IMODIUM     TAKE these medications   amphetamine-dextroamphetamine 15 MG 24 hr capsule Commonly known as: ADDERALL XR Take 15 mg by mouth daily.   aspirin 325 MG EC tablet Take 1 tablet (325 mg total) by mouth daily with breakfast. Start taking on: December 02, 2018   atorvastatin 40 MG tablet Commonly known as: LIPITOR TAKE 1 BY MOUTH DAILY What changed:   how much to take  how to take this  when to take this  additional instructions   b complex vitamins capsule Take 1 capsule by mouth daily.   bisacodyl 5 MG EC tablet Commonly known as: DULCOLAX Take 1 tablet (5 mg total) by  mouth daily as needed for moderate constipation.   busPIRone 10 MG tablet Commonly known as: BUSPAR Take 20 mg by mouth at bedtime.   docusate sodium 100 MG capsule Commonly known as: COLACE Take 1 capsule (100 mg total) by mouth 2 (two) times daily.   escitalopram 20 MG tablet Commonly known as: LEXAPRO Take 20 mg by mouth daily.   HYDROcodone-acetaminophen 7.5-325 MG tablet Commonly known as: NORCO Take 1-2 tablets by mouth every 4 (four) hours as needed for severe pain (pain score 7-10).   neomycin-bacitracin-polymyxin [phone] ointment Apply 1 application topically as needed (for wound care).   pantoprazole 40 MG tablet Commonly known as: PROTONIX Take 40 mg by mouth daily.   pindolol 10 MG tablet Commonly known as: VISKEN Take 10 mg by mouth daily.   QUEtiapine 100 MG tablet Commonly known as: SEROQUEL Take 1 tablet (100 mg total) by mouth at bedtime.   QUEtiapine 25 MG tablet Commonly known as: SEROQUEL Take 1 tablet (25 mg total) by mouth 2 (two) times daily.   vitamin C 500 MG tablet Commonly known as: ASCORBIC ACID Take 500 mg by mouth daily.            Durable Medical Equipment  (From admission, onward)         Start     Ordered   11/28/18 1318  For home use only DME Hospital bed  Once    Question Answer Comment  Length of Need Lifetime   Patient has (list medical condition): Right Hip Fracture   The above medical condition requires: Patient requires the ability to reposition frequently   Head must be elevated greater than: 45 degrees   Bed type Semi-electric      11/28/18 1328           Discharge Care Instructions  (From admission, onward)         Start     Ordered   11/26/18 0000  Weight bearing as tolerated    Question Answer Comment  Laterality right   Extremity Lower      11/26/18 1412         Follow-up Information    Newt Minion, MD In 1 week.   Specialty: Orthopedic Surgery Contact information: [place] [address] [phone]        [facility] Follow up.   Specialty: Assisted Living Facility Contact information: [place]       [facility] Follow up.   Why: hospital bed Contact information: [place] [address] [phone]           Signed: Bonnell Public 12/01/2018, 1:09 PM

## 2018-12-01 NOTE — Care Management (Signed)
COVID results faxed to [facility].  Midge Minium RN, BSN, NCM-BC, ACM-RN [phone]

## 2018-12-04 ENCOUNTER — Telehealth: Payer: Self-pay | Admitting: Orthopedic Surgery

## 2018-12-04 NOTE — Telephone Encounter (Signed)
Patient's HHPT called and weightbearing status at this time is weightbearing as tolerated,

## 2018-12-04 NOTE — Telephone Encounter (Signed)
Almira from [facility] called. Would like to know the WB status of R leg. Her call back number is [phone]

## 2018-12-08 ENCOUNTER — Other Ambulatory Visit: Payer: Self-pay

## 2018-12-08 ENCOUNTER — Ambulatory Visit (INDEPENDENT_AMBULATORY_CARE_PROVIDER_SITE_OTHER): Payer: Medicare Other | Admitting: Orthopedic Surgery

## 2018-12-08 ENCOUNTER — Ambulatory Visit: Payer: Self-pay

## 2018-12-08 ENCOUNTER — Encounter: Payer: Self-pay | Admitting: Orthopedic Surgery

## 2018-12-08 DIAGNOSIS — M25551 Pain in right hip: Secondary | ICD-10-CM

## 2018-12-08 DIAGNOSIS — G8929 Other chronic pain: Secondary | ICD-10-CM

## 2018-12-08 DIAGNOSIS — M5441 Lumbago with sciatica, right side: Secondary | ICD-10-CM

## 2018-12-08 NOTE — Progress Notes (Signed)
Office Visit Note   Patient: Desiree Salinas           Date of Birth: 05-19-1950           MRN: [id number] Visit Date: 12/08/2018              Requested by: [name], MD [address] [place],  [place] [address] PCP: [name], MD  Chief Complaint  Patient presents with  . Right Hip - Pain      HPI: Patient is a 68 year old woman with Alzheimer's dementia status post an intertrochanteric hip fracture on the right.  Patient presents with 2 caregivers she currently ambulates in a wheelchair.  Discussed the patient may begin physical therapy weightbearing as tolerated she will need to therapist for close standby assistance.  Discussed this with her sister discussing that with patient's dementia she may not be able to walk again but mechanically her hip is stable.  Assessment & Plan: Visit Diagnoses:  1. Chronic bilateral low back pain with right-sided sciatica   2. Pain in right hip     Plan: Examination the incisions are well-healed staples are harvested no redness no cellulitis no signs of infection.  Radiographs show stable alignment.  Patient is currently residing at [facility].  Follow-Up Instructions: Return if symptoms worsen or fail to improve.   Ortho Exam  Patient is alert, oriented, no adenopathy, well-dressed, normal affect, normal respiratory effort. Examination the incisions are well-healed staples are harvested there is no redness no cellulitis no signs of infection.  Radiographs show stable internal fixation.  Imaging: No results found. No images are attached to the encounter.  Labs: Lab Results  Component Value Date   REPTSTATUS 11/15/2018 FINAL 11/13/2018   CULT  11/13/2018    NO GROWTH Performed at [facility], [place] [address] [place], [place] [address]    LABORGA NO GROWTH 03/14/2015     Lab Results  Component Value Date   ALBUMIN 2.3 (L) 11/30/2018   ALBUMIN 3.5 11/25/2018   ALBUMIN 3.5 11/13/2018    Lab Results   Component Value Date   MG 2.2 11/05/2017   Lab Results  Component Value Date   VD25OH 40.01 11/05/2017   VD25OH 40.01 08/24/2013   VD25OH 38 08/22/2012    No results found for: PREALBUMIN CBC EXTENDED Latest Ref Rng & Units 11/28/2018 11/27/2018 11/26/2018  WBC 4.0 - 10.5 K/uL 10.2 10.1 12.8(H)  RBC 3.87 - 5.11 MIL/uL 3.24(L) 3.07(L) 3.67(L)  HGB 12.0 - 15.0 g/dL 10.4(L) 9.7(L) 11.4(L)  HCT 36.0 - 46.0 % 30.8(L) 29.4(L) 35.4(L)  PLT 150 - 400 K/uL 253 246 303  NEUTROABS 1.7 - 7.7 K/uL - - -  LYMPHSABS 0.7 - 4.0 K/uL - - -     There is no height or weight on file to calculate BMI.  Orders:  Orders Placed This Encounter  Procedures  . XR HIP UNILAT W OR W/O PELVIS 2-3 VIEWS RIGHT   No orders of the defined types were placed in this encounter.    Procedures: No procedures performed  Clinical Data: No additional findings.  ROS:  All other systems negative, except as noted in the HPI. Review of Systems  Objective: Vital Signs: There were no vitals taken for this visit.  Specialty Comments:  No specialty comments available.  PMFS History: Patient Active Problem List   Diagnosis Date Noted  . Hip fracture ([place]) 11/26/2018  . Fall at home, initial encounter 11/26/2018  . Leukocytosis 11/26/2018  .  COPD (chronic obstructive pulmonary disease) ([place]) 11/26/2018  . Intertrochanteric fracture of right hip ([place]) 11/26/2018  . Closed intertrochanteric fracture of hip, right, initial encounter ([place])   . Dementia with aggressive behavior ([place]) 11/08/2018  . Iron deficiency anemia 07/15/2018  . Fall 06/09/2018  . 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 Degenerative scoliosis 02/10/2018  . Diarrhea 10/14/2013  . Cigarette smoker 08/08/2011  . VAIN I (vaginal intraepithelial neoplasia grade I)   . COLONIC POLYPS 12/21/2007  . ALLERGIC RHINITIS 12/21/2007  . 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 HYPERCHOLESTEROLEMIA 07/05/2007  . ATTENTION DEFICIT DISORDER, ADULT  07/05/2007  . DERMATITIS 07/05/2007  . SHINGLES, HX OF 07/05/2007   Past Medical History:  Diagnosis Date  . ADD (attention deficit disorder)   . Allergy   . Anemia   . Anxiety   . Arthritis   . Bruised rib 2020  . Cataract   . Colon polyps    HYPERPLASTIC POLYP  . COPD (chronic obstructive pulmonary disease) ([place])   . Dementia ([place])   . Depression   . Elevated cholesterol   . Shingles   . STD (sexually transmitted disease)    [place]  . Substance abuse ([place])    Stopped drinking one year ago.   Mar[place] Kitchen 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    Family History  Problem Relation Age of Onset  . Breast cancer Mother        Age 35's  . Diabetes Maternal Grandmother   . Aneurysm Maternal Grandmother   . Aneurysm Maternal Aunt   . Aneurysm Maternal Grandfather   . Ankylosing spondylitis Father   . Colon cancer Neg Hx   . Colon polyps Neg Hx   . Esophageal cancer Neg Hx   . Rectal cancer Neg Hx   . Stomach cancer Neg Hx   . Dementia Neg Hx     Past Surgical History:  Procedure Laterality Date  . ABDOMINAL HYSTERECTOMY  1999   TAH  . ABDOMINAL SURGERY  1999   Abdominoplasty  . ACHILLES TENDON SURGERY    . AUGMENTATION MAMMAPLASTY     Implants  . COLONOSCOPY  2006  . INTRAMEDULLARY (IM) NAIL INTERTROCHANTERIC Right 11/26/2018   Procedure(s) Performed: INTRAMEDULLARY (IM) NAIL INTERTROCHANTRIC RIGHT HIP (Right Hip)  . INTRAMEDULLARY (IM) NAIL INTERTROCHANTERIC Right 11/26/2018   Procedure: INTRAMEDULLARY (IM) NAIL INTERTROCHANTRIC RIGHT HIP;  Surgeon: Newt Minion, MD;  Location: [place];  Service: Orthopedics;  Laterality: Right;  . POLYPECTOMY    . TONSILLECTOMY AND ADENOIDECTOMY    . TUBAL LIGATION     Social History   Occupational History  . Occupation: retired  Tobacco Use  . Smoking status: Former Smoker    Packs/day: 0.90    Types: Cigarettes  . Smokeless tobacco: Never Used  . Tobacco comment: 10 cigarettes per  day as of 04/17/2018. Not smoked since the fall as of 06/11/2018  Substance and Sexual Activity  . Alcohol use: Not Currently    Alcohol/week: 0.0 standard drinks    Comment: none since May 2019  . Drug use: Not Currently    Types: Marijuana  . Sexual activity: Never    Birth control/protection: Post-menopausal, Surgical    Comment: 1st intercourse 68 yo-More than 5 partners

## 2018-12-10 ENCOUNTER — Encounter: Payer: Self-pay | Admitting: Gynecology

## 2018-12-24 ENCOUNTER — Encounter (HOSPITAL_COMMUNITY): Payer: Self-pay

## 2018-12-24 ENCOUNTER — Inpatient Hospital Stay (HOSPITAL_COMMUNITY): Payer: Medicare Other

## 2018-12-24 ENCOUNTER — Emergency Department (HOSPITAL_COMMUNITY): Payer: Medicare Other

## 2018-12-24 ENCOUNTER — Other Ambulatory Visit: Payer: Self-pay

## 2018-12-24 ENCOUNTER — Inpatient Hospital Stay (HOSPITAL_COMMUNITY)
Admission: EM | Admit: 2018-12-24 | Discharge: 2019-01-07 | DRG: 480 | Disposition: A | Discharge status: Skilled Nursing Facility | Payer: Medicare Other | Source: Skilled Nursing Facility | Attending: Internal Medicine | Admitting: Internal Medicine

## 2018-12-24 DIAGNOSIS — J449 Chronic obstructive pulmonary disease, unspecified: Secondary | ICD-10-CM | POA: Diagnosis present

## 2018-12-24 DIAGNOSIS — F0391 Unspecified dementia with behavioral disturbance: Secondary | ICD-10-CM | POA: Diagnosis not present

## 2018-12-24 DIAGNOSIS — D72829 Elevated white blood cell count, unspecified: Secondary | ICD-10-CM | POA: Diagnosis present

## 2018-12-24 DIAGNOSIS — I959 Hypotension, unspecified: Secondary | ICD-10-CM | POA: Diagnosis not present

## 2018-12-24 DIAGNOSIS — S72301A Unspecified fracture of shaft of right femur, initial encounter for closed fracture: Secondary | ICD-10-CM | POA: Diagnosis not present

## 2018-12-24 DIAGNOSIS — E43 Unspecified severe protein-calorie malnutrition: Secondary | ICD-10-CM | POA: Diagnosis present

## 2018-12-24 DIAGNOSIS — Y92129 Unspecified place in nursing home as the place of occurrence of the external cause: Secondary | ICD-10-CM

## 2018-12-24 DIAGNOSIS — S72141G Displaced intertrochanteric fracture of right femur, subsequent encounter for closed fracture with delayed healing: Secondary | ICD-10-CM

## 2018-12-24 DIAGNOSIS — Z91040 Latex allergy status: Secondary | ICD-10-CM | POA: Diagnosis not present

## 2018-12-24 DIAGNOSIS — S72141A Displaced intertrochanteric fracture of right femur, initial encounter for closed fracture: Secondary | ICD-10-CM | POA: Diagnosis not present

## 2018-12-24 DIAGNOSIS — E785 Hyperlipidemia, unspecified: Secondary | ICD-10-CM | POA: Diagnosis present

## 2018-12-24 DIAGNOSIS — F988 Other specified behavioral and emotional disorders with onset usually occurring in childhood and adolescence: Secondary | ICD-10-CM | POA: Diagnosis present

## 2018-12-24 DIAGNOSIS — F03918 Unspecified dementia, unspecified severity, with other behavioral disturbance: Secondary | ICD-10-CM | POA: Diagnosis present

## 2018-12-24 DIAGNOSIS — D62 Acute posthemorrhagic anemia: Secondary | ICD-10-CM | POA: Diagnosis not present

## 2018-12-24 DIAGNOSIS — F419 Anxiety disorder, unspecified: Secondary | ICD-10-CM | POA: Diagnosis not present

## 2018-12-24 DIAGNOSIS — Z833 Family history of diabetes mellitus: Secondary | ICD-10-CM

## 2018-12-24 DIAGNOSIS — M6281 Muscle weakness (generalized): Secondary | ICD-10-CM | POA: Diagnosis not present

## 2018-12-24 DIAGNOSIS — D509 Iron deficiency anemia, unspecified: Secondary | ICD-10-CM | POA: Diagnosis present

## 2018-12-24 DIAGNOSIS — Z20828 Contact with and (suspected) exposure to other viral communicable diseases: Secondary | ICD-10-CM | POA: Diagnosis present

## 2018-12-24 DIAGNOSIS — Z87891 Personal history of nicotine dependence: Secondary | ICD-10-CM | POA: Diagnosis not present

## 2018-12-24 DIAGNOSIS — Z993 Dependence on wheelchair: Secondary | ICD-10-CM

## 2018-12-24 DIAGNOSIS — N390 Urinary tract infection, site not specified: Secondary | ICD-10-CM | POA: Diagnosis not present

## 2018-12-24 DIAGNOSIS — M978XXA Periprosthetic fracture around other internal prosthetic joint, initial encounter: Secondary | ICD-10-CM

## 2018-12-24 DIAGNOSIS — G92 Toxic encephalopathy: Secondary | ICD-10-CM | POA: Diagnosis not present

## 2018-12-24 DIAGNOSIS — R278 Other lack of coordination: Secondary | ICD-10-CM | POA: Diagnosis not present

## 2018-12-24 DIAGNOSIS — T50905A Adverse effect of unspecified drugs, medicaments and biological substances, initial encounter: Secondary | ICD-10-CM | POA: Diagnosis not present

## 2018-12-24 DIAGNOSIS — W19XXXA Unspecified fall, initial encounter: Secondary | ICD-10-CM | POA: Diagnosis not present

## 2018-12-24 DIAGNOSIS — Z7982 Long term (current) use of aspirin: Secondary | ICD-10-CM

## 2018-12-24 DIAGNOSIS — R2681 Unsteadiness on feet: Secondary | ICD-10-CM | POA: Diagnosis not present

## 2018-12-24 DIAGNOSIS — M978XXD Periprosthetic fracture around other internal prosthetic joint, subsequent encounter: Secondary | ICD-10-CM | POA: Diagnosis not present

## 2018-12-24 DIAGNOSIS — Z888 Allergy status to other drugs, medicaments and biological substances status: Secondary | ICD-10-CM | POA: Diagnosis not present

## 2018-12-24 DIAGNOSIS — R6510 Systemic inflammatory response syndrome (SIRS) of non-infectious origin without acute organ dysfunction: Secondary | ICD-10-CM | POA: Diagnosis not present

## 2018-12-24 DIAGNOSIS — Z803 Family history of malignant neoplasm of breast: Secondary | ICD-10-CM

## 2018-12-24 DIAGNOSIS — Z96649 Presence of unspecified artificial hip joint: Secondary | ICD-10-CM

## 2018-12-24 DIAGNOSIS — Z6821 Body mass index (BMI) 21.0-21.9, adult: Secondary | ICD-10-CM | POA: Diagnosis not present

## 2018-12-24 DIAGNOSIS — Z91048 Other nonmedicinal substance allergy status: Secondary | ICD-10-CM | POA: Diagnosis not present

## 2018-12-24 DIAGNOSIS — R52 Pain, unspecified: Secondary | ICD-10-CM | POA: Diagnosis not present

## 2018-12-24 DIAGNOSIS — M1990 Unspecified osteoarthritis, unspecified site: Secondary | ICD-10-CM | POA: Diagnosis present

## 2018-12-24 DIAGNOSIS — M9701XA Periprosthetic fracture around internal prosthetic right hip joint, initial encounter: Secondary | ICD-10-CM | POA: Diagnosis not present

## 2018-12-24 DIAGNOSIS — W050XXA Fall from non-moving wheelchair, initial encounter: Secondary | ICD-10-CM | POA: Diagnosis present

## 2018-12-24 DIAGNOSIS — B9620 Unspecified Escherichia coli [E. coli] as the cause of diseases classified elsewhere: Secondary | ICD-10-CM | POA: Diagnosis not present

## 2018-12-24 DIAGNOSIS — E7800 Pure hypercholesterolemia, unspecified: Secondary | ICD-10-CM | POA: Diagnosis present

## 2018-12-24 DIAGNOSIS — L89619 Pressure ulcer of right heel, unspecified stage: Secondary | ICD-10-CM | POA: Diagnosis present

## 2018-12-24 DIAGNOSIS — D72823 Leukemoid reaction: Secondary | ICD-10-CM | POA: Diagnosis not present

## 2018-12-24 DIAGNOSIS — F331 Major depressive disorder, recurrent, moderate: Secondary | ICD-10-CM | POA: Diagnosis not present

## 2018-12-24 DIAGNOSIS — R41841 Cognitive communication deficit: Secondary | ICD-10-CM | POA: Diagnosis not present

## 2018-12-24 LAB — CBC WITH DIFFERENTIAL/PLATELET
Abs Immature Granulocytes: 0.1 10*3/uL — ABNORMAL HIGH (ref 0.00–0.07)
Basophils Absolute: 0.1 10*3/uL (ref 0.0–0.1)
Basophils Relative: 0 %
Eosinophils Absolute: 0.2 10*3/uL (ref 0.0–0.5)
Eosinophils Relative: 2 %
HCT: 36.1 % (ref 36.0–46.0)
Hemoglobin: 11.4 g/dL — ABNORMAL LOW (ref 12.0–15.0)
Immature Granulocytes: 1 %
Lymphocytes Relative: 10 %
Lymphs Abs: 1.5 10*3/uL (ref 0.7–4.0)
MCH: 30.2 pg (ref 26.0–34.0)
MCHC: 31.6 g/dL (ref 30.0–36.0)
MCV: 95.5 fL (ref 80.0–100.0)
Monocytes Absolute: 0.8 10*3/uL (ref 0.1–1.0)
Monocytes Relative: 6 %
Neutro Abs: 12.2 10*3/uL — ABNORMAL HIGH (ref 1.7–7.7)
Neutrophils Relative %: 81 %
Platelets: 351 10*3/uL (ref 150–400)
RBC: 3.78 MIL/uL — ABNORMAL LOW (ref 3.87–5.11)
RDW: 13.2 % (ref 11.5–15.5)
WBC: 14.9 10*3/uL — ABNORMAL HIGH (ref 4.0–10.5)
nRBC: 0 % (ref 0.0–0.2)

## 2018-12-24 LAB — BASIC METABOLIC PANEL
Anion gap: 13 (ref 5–15)
BUN: 13 mg/dL (ref 8–23)
CO2: 21 mmol/L — ABNORMAL LOW (ref 22–32)
Calcium: 8.7 mg/dL — ABNORMAL LOW (ref 8.9–10.3)
Chloride: 103 mmol/L (ref 98–111)
Creatinine, Ser: 0.87 mg/dL (ref 0.44–1.00)
GFR calc Af Amer: >60 mL/min (ref >60)
GFR calc non Af Amer: >60 mL/min (ref >60)
Glucose, Bld: 115 mg/dL — ABNORMAL HIGH (ref 70–99)
Potassium: 4.3 mmol/L (ref 3.5–5.1)
Sodium: 137 mmol/L (ref 135–145)

## 2018-12-24 LAB — SARS CORONAVIRUS 2 BY RT PCR (HOSPITAL ORDER, PERFORMED IN ~~LOC~~ HOSPITAL LAB): SARS Coronavirus 2: NEGATIVE

## 2018-12-24 MED ORDER — DOCUSATE SODIUM 100 MG PO CAPS
100.0000 mg | ORAL_CAPSULE | Freq: Two times a day (BID) | ORAL | Status: DC
  Administered 2018-12-25 – 2019-01-07 (×22): 100 mg via ORAL
  Filled 2018-12-24 (×24): qty 1

## 2018-12-24 MED ORDER — IPRATROPIUM-ALBUTEROL 0.5-2.5 (3) MG/3ML IN SOLN: 3.0000 mL | Freq: Four times a day (QID) | RESPIRATORY_TRACT | Status: DC | PRN

## 2018-12-24 MED ORDER — IPRATROPIUM-ALBUTEROL 20-100 MCG/ACT IN AERS: 1.0000 | INHALATION_SPRAY | Freq: Four times a day (QID) | RESPIRATORY_TRACT | Status: DC | PRN

## 2018-12-24 MED ORDER — METHOCARBAMOL 500 MG PO TABS
500.0000 mg | ORAL_TABLET | Freq: Four times a day (QID) | ORAL | Status: DC | PRN
  Administered 2018-12-27: 500 mg via ORAL
  Filled 2018-12-24: qty 1

## 2018-12-24 MED ORDER — HYDROMORPHONE HCL 1 MG/ML IJ SOLN
0.5000 mg | Freq: Once | INTRAMUSCULAR | Status: AC
  Administered 2018-12-24: 19:00:00 0.5 mg via INTRAVENOUS
  Filled 2018-12-24: qty 1

## 2018-12-24 MED ORDER — HYDROCODONE-ACETAMINOPHEN 5-325 MG PO TABS
1.0000 | ORAL_TABLET | Freq: Four times a day (QID) | ORAL | Status: DC | PRN
  Administered 2018-12-25: 07:00:00 2 via ORAL
  Filled 2018-12-24: qty 2

## 2018-12-24 MED ORDER — HEPARIN SODIUM (PORCINE) 5000 UNIT/ML IJ SOLN
5000.0000 [IU] | Freq: Three times a day (TID) | INTRAMUSCULAR | Status: DC
  Administered 2018-12-25 (×2): 5000 [IU] via SUBCUTANEOUS
  Filled 2018-12-24 (×2): qty 1

## 2018-12-24 MED ORDER — METHOCARBAMOL 1000 MG/10ML IJ SOLN
500.0000 mg | Freq: Four times a day (QID) | INTRAVENOUS | Status: DC | PRN
  Filled 2018-12-24: qty 5

## 2018-12-24 MED ORDER — HYDROMORPHONE HCL 1 MG/ML IJ SOLN
0.5000 mg | INTRAMUSCULAR | Status: DC | PRN
  Administered 2018-12-25: 03:00:00 0.5 mg via INTRAVENOUS
  Filled 2018-12-24: qty 1

## 2018-12-24 NOTE — ED Notes (Signed)
Caregiver [name] for morning: [phone]

## 2018-12-24 NOTE — H&P (Addendum)
History and Physical    Desiree Salinas [id number] DOB: 1951-01-29 DOA: 12/24/2018  PCP: [name], MD Patient coming from: [facility]  Chief Complaint: Right hip pain  HPI: Desiree Salinas is a 68 y.o. female with medical history significant of dementia, anemia, anxiety, arthritis, COPD, depression, hyperlipidemia, hospital admission 9/22-9/28 for intertrochanteric fracture of right hip status post intramedullary nailing by Dr. Sharol Given on 9/23 presenting from her nursing home with complaints of right hip pain.  Patient is somnolent after receiving fentanyl for pain.  No history could be obtained from her.  Per caregiver at bedside, this morning patient slipped out of her wheelchair while staff at the nursing home were helping her take a bath.  She did not hit her head or lose consciousness.  Patient has been complaining of pain in her right hip.  ED Course: Hemodynamically stable.  White blood cell count 14.9.  Hemoglobin 11.4, stable compared to prior labs.  UA pending.  SARS-CoV-2 test pending.  X-ray of right hip and pelvis showing previous intramedullary rod and screw fixation of comminuted intertrochanteric fracture, now with acute displaced periprosthetic fracture involving the proximal shaft of the femur.  No femoral head dislocation. Patient received Dilaudid for pain.  ED provider discussed the case with Dr. Marlou Sa from orthopedics, patient will be taken to the OR in the morning.  Review of Systems:  All systems reviewed and apart from history of presenting illness, are negative.  Past Medical History:  Diagnosis Date  . ADD (attention deficit disorder)   . Allergy   . Anemia   . Anxiety   . Arthritis   . Bruised rib 2020  . Cataract   . Colon polyps    HYPERPLASTIC POLYP  . COPD (chronic obstructive pulmonary disease) ([place])   . Dementia ([place])   . Depression   . Elevated cholesterol   . Shingles   . STD (sexually transmitted disease)    Condylomata  . Substance abuse  ([place])    Stopped drinking one year ago.   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 ABDOMINAL HYSTERECTOMY  1999   TAH  . ABDOMINAL SURGERY  1999   Abdominoplasty  . ACHILLES TENDON SURGERY    . AUGMENTATION MAMMAPLASTY     Implants  . COLONOSCOPY  2006  . INTRAMEDULLARY (IM) NAIL INTERTROCHANTERIC Right 11/26/2018   Procedure(s) Performed: INTRAMEDULLARY (IM) NAIL INTERTROCHANTRIC RIGHT HIP (Right Hip)  . INTRAMEDULLARY (IM) NAIL INTERTROCHANTERIC Right 11/26/2018   Procedure: INTRAMEDULLARY (IM) NAIL INTERTROCHANTRIC RIGHT HIP;  Surgeon: Newt Minion, MD;  Location: [facility];  Service: Orthopedics;  Laterality: Right;  . POLYPECTOMY    . TONSILLECTOMY AND ADENOIDECTOMY    . TUBAL LIGATION       reports that she has quit smoking. Her smoking use included cigarettes. She smoked 0.90 packs per day. She has never used smokeless tobacco. She reports previous alcohol use. She reports previous drug use. Drug: Marijuana.  Allergies  Allergen Reactions  . Latex   . Nickel Hives    Hives, itching, weeping   . Cobalt Rash  . Eucerin [Basis Facial Moisturizer] Hives, Itching and Rash    eucerin lotion causes an allergic skin reaction    Family History  Problem Relation Age of Onset  . Breast cancer Mother        Age 50's  . Diabetes Maternal Grandmother   .  Aneurysm Maternal Grandmother   . Aneurysm Maternal Aunt   . Aneurysm Maternal Grandfather   . Ankylosing spondylitis Father   . Colon cancer Neg Hx   . Colon polyps Neg Hx   . Esophageal cancer Neg Hx   . Rectal cancer Neg Hx   . Stomach cancer Neg Hx   . Dementia Neg Hx     Prior to Admission medications   Medication Sig Start Date End Date Taking? Authorizing Provider  amphetamine-dextroamphetamine (ADDERALL XR) 15 MG 24 hr capsule Take 15 mg by mouth daily.  06/19/18   [provider]  aspirin EC 325 MG EC  tablet Take 1 tablet (325 mg total) by mouth daily with breakfast. 12/02/18   Bonnell Public, MD  atorvastatin (LIPITOR) 40 MG tablet TAKE 1 BY MOUTH DAILY Patient taking differently: Take 40 mg by mouth daily.  06/23/14   Noralee Space, MD  b complex vitamins capsule Take 1 capsule by mouth daily. 08/20/18   Brunetta Genera, MD  bisacodyl (DULCOLAX) 5 MG EC tablet Take 1 tablet (5 mg total) by mouth daily as needed for moderate constipation. 12/01/18   Bonnell Public, MD  busPIRone (BUSPAR) 10 MG tablet Take 20 mg by mouth at bedtime.     [provider]  docusate sodium (COLACE) 100 MG capsule Take 1 capsule (100 mg total) by mouth 2 (two) times daily. 12/01/18   Dana Allan I, MD  escitalopram (LEXAPRO) 20 MG tablet Take 20 mg by mouth daily.    [provider]  HYDROcodone-acetaminophen (NORCO) 7.5-325 MG tablet Take 1-2 tablets by mouth every 4 (four) hours as needed for severe pain (pain score 7-10). 12/01/18   Bonnell Public, MD  neomycin-bacitracin-polymyxin (NEOSPORIN) [phone] ointment Apply 1 application topically as needed (for wound care).    [provider]  pantoprazole (PROTONIX) 40 MG tablet Take 40 mg by mouth daily.  06/12/18   [provider]  pindolol (VISKEN) 10 MG tablet Take 10 mg by mouth daily.     [provider]  QUEtiapine (SEROQUEL) 100 MG tablet Take 1 tablet (100 mg total) by mouth at bedtime. 12/01/18   Bonnell Public, MD  QUEtiapine (SEROQUEL) 25 MG tablet Take 1 tablet (25 mg total) by mouth 2 (two) times daily. 12/01/18   Bonnell Public, MD  vitamin C (ASCORBIC ACID) 500 MG tablet Take 500 mg by mouth daily.    [provider]    Physical Exam: Vitals:   12/24/18 1800 12/24/18 1815 12/24/18 1845 12/24/18 1915  BP: 139/80 139/67 (!) 122/91 117/69  Pulse: 88 92 95 94  Resp: 15 16 20 19   Temp:      TempSrc:      SpO2: 98% 99% 99% 96%  Weight:      Height:        Physical  Exam  Constitutional: No distress.  HENT:  Head: Normocephalic.  Eyes: Right eye exhibits no discharge. Left eye exhibits no discharge.  Neck: Neck supple.  Cardiovascular: Normal rate, regular rhythm and intact distal pulses.  Pulmonary/Chest: Effort normal. No respiratory distress. She has no wheezes. She has no rales.  Anterior lung fields clear to auscultation  Abdominal: Soft. Bowel sounds are normal. She exhibits no distension. There is no abdominal tenderness. There is no guarding.  Musculoskeletal:        General: No edema.     Comments: Right lower extremity shortened and externally rotated  Neurological:  Somnolent  Skin: Skin is warm and dry. She is not diaphoretic.     Labs on Admission: I have personally reviewed following labs and imaging studies  CBC: Recent Labs  Lab 12/24/18 1837  WBC 14.9*  NEUTROABS 12.2*  HGB 11.4*  HCT 36.1  MCV 95.5  PLT [id number]   Basic Metabolic Panel: Recent Labs  Lab 12/24/18 1837  NA 137  K 4.3  CL 103  CO2 21*  GLUCOSE 115*  BUN 13  CREATININE 0.87  CALCIUM 8.7*   GFR: Estimated Creatinine Clearance: 51.9 mL/min (by C-G formula based on SCr of 0.87 mg/dL). Liver Function Tests: No results for input(s): AST, ALT, ALKPHOS, BILITOT, PROT, ALBUMIN in the last 168 hours. No results for input(s): LIPASE, AMYLASE in the last 168 hours. No results for input(s): AMMONIA in the last 168 hours. Coagulation Profile: No results for input(s): INR, PROTIME in the last 168 hours. Cardiac Enzymes: No results for input(s): CKTOTAL, CKMB, CKMBINDEX, TROPONINI in the last 168 hours. BNP (last 3 results) No results for input(s): PROBNP in the last 8760 hours. HbA1C: No results for input(s): HGBA1C in the last 72 hours. CBG: No results for input(s): GLUCAP in the last 168 hours. Lipid Profile: No results for input(s): CHOL, HDL, LDLCALC, TRIG, CHOLHDL, LDLDIRECT in the last 72 hours. Thyroid Function Tests: No results for input(s):  TSH, T4TOTAL, FREET4, T3FREE, THYROIDAB in the last 72 hours. Anemia Panel: No results for input(s): VITAMINB12, FOLATE, FERRITIN, TIBC, IRON, RETICCTPCT in the last 72 hours. Urine analysis:    Component Value Date/Time   COLORURINE YELLOW 11/26/2018 0812   APPEARANCEUR HAZY (A) 11/26/2018 0812   APPEARANCEUR Clear 06/11/2018 1120   LABSPEC 1.020 11/26/2018 0812   PHURINE 5.0 11/26/2018 0812   GLUCOSEU NEGATIVE 11/26/2018 0812   GLUCOSEU NEGATIVE 08/14/2011 0815   HGBUR NEGATIVE 11/26/2018 0812   BILIRUBINUR NEGATIVE 11/26/2018 0812   BILIRUBINUR Negative 06/11/2018 1120   KETONESUR NEGATIVE 11/26/2018 0812   PROTEINUR NEGATIVE 11/26/2018 0812   UROBILINOGEN 0.2 12/30/2013 1248   NITRITE NEGATIVE 11/26/2018 0812   LEUKOCYTESUR TRACE (A) 11/26/2018 0812    Radiological Exams on Admission: Dg Hip Unilat  With Pelvis 2-3 Views Right  Result Date: 12/24/2018 CLINICAL DATA:  Hip pain right side EXAM: DG HIP (WITH OR WITHOUT PELVIS) 2-3V RIGHT COMPARISON:  12/08/2018, 11/25/2018 FINDINGS: SI joints are non widened. Pubic symphysis intact. Questionable nondisplaced fracture lucency versus soft tissue artifact over the right inferior pubic ramus. Intramedullary rod and distal screw fixation of previous comminuted right intertrochanteric fracture with displaced lesser trochanteric fracture fragment. Acute periprosthetic fracture involving the proximal shaft of the right femur with about 1 shaft diameter of medial displacement of distal fracture fragment. IMPRESSION: 1. Previous intramedullary rod and screw fixation of comminuted intertrochanteric fracture, now with acute displaced periprosthetic fracture involving the proximal shaft of the femur. No femoral head dislocation. Electronically Signed   By: Donavan Foil M.D.   On: 12/24/2018 16:32    EKG: Pending at this time.  Assessment/Plan Principal Problem:   Periprosthetic fracture of hip Active Problems:   Leukocytosis   COPD (chronic  obstructive pulmonary disease) (HCC)   R hip periprosthetic fracture  X-ray of right hip and pelvis showing previous intramedullary rod and screw fixation of comminuted intertrochanteric fracture, now with acute displaced periprosthetic fracture involving the proximal shaft of the femur.  No femoral head dislocation. ED provider discussed the case with Dr. Marlou Sa from orthopedics, patient will be taken to the OR in the morning. -  Pain management: Dilaudid as needed, Norco as needed -Robaxin as needed for muscle spasms -Keep n.p.o. after midnight -Bowel regimen -Nonweightbearing  Addendum: Ortho planning on taking to the OR on Friday 10/23.  Diet okay.  Okay to give subcutaneous heparin for DVT prophylaxis.  Leukocytosis Patient is afebrile. White blood cell count 14.9.  No signs of sepsis. -Chest x-ray -UA pending  COPD -Stable.  No wheezing or dyspnea.  Combivent as needed.  Pharmacy med rec pending.  DVT prophylaxis: SCDs Code Status: Full code Family Communication: Caregiver at bedside. Disposition Plan: Anticipate discharge after clinical improvement. Consults called: Orthopedics Admission status: It is my clinical opinion that admission to [facility] is reasonable and necessary in this 68 y.o. female . With recent admission for right hip intertrochanteric fracture status post intramedullary fixation on 9/23 now presenting with right hip pain and now has periprosthetic fracture of this hip.  Orthopedics planning on taking her to the OR in the morning.  Given the aforementioned, the predictability of an adverse outcome is felt to be significant. I expect that the patient will require at least 2 midnights in the hospital to treat this condition.   The medical decision making on this patient was of high complexity and the patient is at high risk for clinical deterioration, therefore this is a level 3 visit.  Shela Leff MD [facility] Pager [phone]  If 7PM-7AM,  please contact night-coverage [email or web address] Password [facility]  12/24/2018, 8:35 PM

## 2018-12-24 NOTE — ED Triage Notes (Signed)
PER EMS: pt from [facility] with c/o right side hip pain. She was attempting to get out of her wheelchair when she assisted to the ground, did not not physically fall though. No injuries sustained but nursing home is requesting her to be seen due to her hip pain but she did have recent hip surgery a month ago. She has dementia and is at baseline. BP- 126/82, HR-84, RR-16, O2-98%.

## 2018-12-24 NOTE — Consult Note (Signed)
ORTHOPAEDIC CONSULTATION  REQUESTING PHYSICIAN: Shela Leff, MD  Chief Complaint: "My right hip hurts"  HPI: Desiree Salinas is a 68 y.o. female who presents with right proximal femoral shaft fracture following fall.  Patient is s/p Right hip IM nail on 11/26/18.  She had fall earlier today, falling from her wheelchair, with immediate pain and deformity.  Patient is a poor historian due to history of dementia and history is limited.  Caretaker provided much of history.  Caretaker denies any history of diabetes, blood clots, blood thinner use.  She has recently quit smoking.    Past Medical History:  Diagnosis Date  . ADD (attention deficit disorder)   . Allergy   . Anemia   . Anxiety   . Arthritis   . Bruised rib 2020  . Cataract   . Colon polyps    HYPERPLASTIC POLYP  . COPD (chronic obstructive pulmonary disease) ([place])   . Dementia ([place])   . Depression   . Elevated cholesterol   . Shingles   . STD (sexually transmitted disease)    Condylomata  . Substance abuse ([place])    Stopped drinking one year ago.   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 ABDOMINAL HYSTERECTOMY  1999   TAH  . ABDOMINAL SURGERY  1999   Abdominoplasty  . ACHILLES TENDON SURGERY    . AUGMENTATION MAMMAPLASTY     Implants  . COLONOSCOPY  2006  . INTRAMEDULLARY (IM) NAIL INTERTROCHANTERIC Right 11/26/2018   Procedure(s) Performed: INTRAMEDULLARY (IM) NAIL INTERTROCHANTRIC RIGHT HIP (Right Hip)  . INTRAMEDULLARY (IM) NAIL INTERTROCHANTERIC Right 11/26/2018   Procedure: INTRAMEDULLARY (IM) NAIL INTERTROCHANTRIC RIGHT HIP;  Surgeon: Newt Minion, MD;  Location: [facility];  Service: Orthopedics;  Laterality: Right;  . POLYPECTOMY    . TONSILLECTOMY AND ADENOIDECTOMY    . TUBAL LIGATION     Social History   Socioeconomic History  . Marital status: Divorced    Spouse name: Not on file  . Number of  children: 2  . Years of education: College  . Highest education level: Not on file  Occupational History  . Occupation: retired  Scientific laboratory technician  . Financial resource strain: Not on file  . Food insecurity    Worry: Not on file    Inability: Not on file  . Transportation needs    Medical: Not on file    Non-medical: Not on file  Tobacco Use  . Smoking status: Former Smoker    Packs/day: 0.90    Types: Cigarettes  . Smokeless tobacco: Never Used  . Tobacco comment: 10 cigarettes per day as of 04/17/2018. Not smoked since the fall as of 06/11/2018  Substance and Sexual Activity  . Alcohol use: Not Currently    Alcohol/week: 0.0 standard drinks    Comment: none since May 2019  . Drug use: Not Currently    Types: Marijuana  . Sexual activity: Never    Birth control/protection: Post-menopausal, Surgical    Comment: 1st intercourse 68 yo-More than 5 partners  Lifestyle  . Physical activity    Days per week: Not on file    Minutes per session: Not on file  . Stress: Not on file  Relationships  . Social Herbalist on phone: Not on file    Gets together: Not on file    Attends religious service: Not on file  Active member of club or organization: Not on file    Attends meetings of clubs or organizations: Not on file    Relationship status: Not on file  Other Topics Concern  . Not on file  Social History Narrative   Lives: alone, has caregiver there 8:00 AM to 10 PM 7 days per week and 24 hour on call. Daughter also lives there     Family History  Problem Relation Age of Onset  . Breast cancer Mother        Age 25's  . Diabetes Maternal Grandmother   . Aneurysm Maternal Grandmother   . Aneurysm Maternal Aunt   . Aneurysm Maternal Grandfather   . Ankylosing spondylitis Father   . Colon cancer Neg Hx   . Colon polyps Neg Hx   . Esophageal cancer Neg Hx   . Rectal cancer Neg Hx   . Stomach cancer Neg Hx   . Dementia Neg Hx    - negative except otherwise stated  in the family history section Allergies  Allergen Reactions  . Latex   . Nickel Hives    Hives, itching, weeping   . Cobalt Rash  . Eucerin [Basis Facial Moisturizer] Hives, Itching and Rash    eucerin lotion causes an allergic skin reaction   Prior to Admission medications   Medication Sig Start Date End Date Taking? Authorizing Provider  amphetamine-dextroamphetamine (ADDERALL XR) 15 MG 24 hr capsule Take 15 mg by mouth daily.  06/19/18   [provider]  aspirin EC 325 MG EC tablet Take 1 tablet (325 mg total) by mouth daily with breakfast. 12/02/18   Bonnell Public, MD  atorvastatin (LIPITOR) 40 MG tablet TAKE 1 BY MOUTH DAILY Patient taking differently: Take 40 mg by mouth daily.  06/23/14   Noralee Space, MD  b complex vitamins capsule Take 1 capsule by mouth daily. 08/20/18   Brunetta Genera, MD  bisacodyl (DULCOLAX) 5 MG EC tablet Take 1 tablet (5 mg total) by mouth daily as needed for moderate constipation. 12/01/18   Bonnell Public, MD  busPIRone (BUSPAR) 10 MG tablet Take 20 mg by mouth at bedtime.     [provider]  docusate sodium (COLACE) 100 MG capsule Take 1 capsule (100 mg total) by mouth 2 (two) times daily. 12/01/18   Dana Allan I, MD  escitalopram (LEXAPRO) 20 MG tablet Take 20 mg by mouth daily.    [provider]  HYDROcodone-acetaminophen (NORCO) 7.5-325 MG tablet Take 1-2 tablets by mouth every 4 (four) hours as needed for severe pain (pain score 7-10). 12/01/18   Bonnell Public, MD  neomycin-bacitracin-polymyxin (NEOSPORIN) [phone] ointment Apply 1 application topically as needed (for wound care).    [provider]  pantoprazole (PROTONIX) 40 MG tablet Take 40 mg by mouth daily.  06/12/18   [provider]  pindolol (VISKEN) 10 MG tablet Take 10 mg by mouth daily.     [provider]  QUEtiapine (SEROQUEL) 100 MG tablet Take 1 tablet (100 mg total) by mouth at bedtime. 12/01/18   Bonnell Public, MD  QUEtiapine (SEROQUEL) 25 MG tablet Take 1 tablet (25 mg total) by mouth 2 (two) times daily. 12/01/18   Bonnell Public, MD  vitamin C (ASCORBIC ACID) 500 MG tablet Take 500 mg by mouth daily.    [provider]   Dg Hip Unilat  With Pelvis 2-3 Views Right  Result Date: 12/24/2018 CLINICAL DATA:  Hip pain right  side EXAM: DG HIP (WITH OR WITHOUT PELVIS) 2-3V RIGHT COMPARISON:  12/08/2018, 11/25/2018 FINDINGS: SI joints are non widened. Pubic symphysis intact. Questionable nondisplaced fracture lucency versus soft tissue artifact over the right inferior pubic ramus. Intramedullary rod and distal screw fixation of previous comminuted right intertrochanteric fracture with displaced lesser trochanteric fracture fragment. Acute periprosthetic fracture involving the proximal shaft of the right femur with about 1 shaft diameter of medial displacement of distal fracture fragment. IMPRESSION: 1. Previous intramedullary rod and screw fixation of comminuted intertrochanteric fracture, now with acute displaced periprosthetic fracture involving the proximal shaft of the femur. No femoral head dislocation. Electronically Signed   By: Donavan Foil M.D.   On: 12/24/2018 16:32   - pertinent xrays, CT, MRI studies were reviewed and independently interpreted  Positive ROS: All other systems have been reviewed and were otherwise negative with the exception of those mentioned in the HPI and as above.  Physical Exam: General: Alert, no acute distress Psychiatric: Patient is competent for consent with normal mood and affect Lymphatic: No axillary or cervical lymphadenopathy Cardiovascular: No pedal edema Respiratory: No cyanosis, no use of accessory musculature GI: No organomegaly, abdomen is soft and non-tender    Images:  @ENCIMAGES @  Labs:  Lab Results  Component Value Date   REPTSTATUS 11/15/2018 FINAL 11/13/2018   CULT  11/13/2018    NO GROWTH Performed at [facility] [address] [place], [place] [address]    LABORGA NO GROWTH 03/14/2015    Lab Results  Component Value Date   ALBUMIN 2.3 (L) 11/30/2018   ALBUMIN 3.5 11/25/2018   ALBUMIN 3.5 11/13/2018    Neurologic: Patient does not have protective sensation bilateral lower extremities.   MUSCULOSKELETAL:   Shortened and externally rotated right lower extremity.  Painful logrolling of the right hip.  No pain with passive range of motion of right ankle, right knee, left ankle, left knee, left hip.  No pain with passive range of motion of bilateral fingers, hand, wrist, elbow, shoulders.  No effusion of bilateral knees.  2+ DP pulse of right lower extremity.  Exam is limited due to patient cooperation.  Unable to assess motor or sensory function.  Assessment: Right hip periprosthetic fracture  Plan: Admit to hospitalist service Plan for Right hip periprosthetic fracture ORIF by Dr. Sharol Given on 12/26/18 Recommend subcutaneous heparin for VTE prophylaxis until surgery on Friday  Thank you for the consult and the opportunity to see Desiree Salinas, Desiree Salinas [facility] 8:43 PM 12/24/2018

## 2018-12-24 NOTE — ED Notes (Signed)
Pt has personal care givers around the clock due to no family in town. Caregivers will be switching out during her stay due to shift change.

## 2018-12-24 NOTE — ED Provider Notes (Signed)
  Face-to-face evaluation   History: Patient was being transferred in her wheelchair when she "scooted out."  She was noted to have right hip pain after that, no other injuries were noted.  Patient has been nonambulatory since her hip surgery about a month ago.  She has ongoing swelling in her right leg since that time.  Physical exam: Patient is resting, does not appear to be in pain.  Right leg is mildly swollen.  Right hip is tender without significant appreciable deformity.  Medical screening examination/treatment/procedure(s) were conducted as a shared visit with non-physician practitioner(s) and myself.  I personally evaluated the patient during the encounter    Daleen Bo, MD 12/27/18 [phone]

## 2018-12-24 NOTE — ED Provider Notes (Signed)
[facility] EMERGENCY DEPARTMENT Provider Note   CSN: [id number] Arrival date & time: 12/24/18  1335     History   Chief Complaint Chief Complaint  Patient presents with   Hip Pain   Level 5 caveat due to dementia.  HPI Desiree Salinas is a 68 y.o. female with history of dementia, COPD presents today from [facility] accompanied by a caregiver for evaluation of acute onset, persistent right lower extremity swelling.  Caregiver reports that around 10 AM the patient was taking a shower with assistance from the morning team.  The morning team then placed the patient in her wheelchair where she "slumped out of it ".  Per the caregiver she did not hit her head or lose consciousness.  She is 1 month status post right hip surgery with Dr. Sharol Given after sustaining an intertrochanteric fracture of the right hip after an unwitnessed fall.  Since then she has been mostly wheelchair-bound but has been able to ambulate with the assistance of a walker at times.  Caregiver noted that she has not been able to ambulate at all since the fall and has had progressively worsening swelling of the right lower extremity.  Patient endorses pain "all over" and will grimace and yell out if the right lower extremity is moved passively in any fashion.     The history is provided by a caregiver. The history is limited by the condition of the patient.    Past Medical History:  Diagnosis Date   ADD (attention deficit disorder)    Allergy    Anemia    Anxiety    Arthritis    Bruised rib 2020   Cataract    Colon polyps    HYPERPLASTIC POLYP   COPD (chronic obstructive pulmonary disease) (HCC)    Dementia (HCC)    Depression    Elevated cholesterol    Shingles    STD (sexually transmitted disease)    Condylomata   Substance abuse ([place])    Stopped drinking one year ago.    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   HYPERCHOLESTEROLEMIA 07/05/2007   ATTENTION DEFICIT DISORDER, ADULT 07/05/2007   DERMATITIS 07/05/2007   SHINGLES, HX OF 07/05/2007    Past Surgical History:  Procedure Laterality Date   ABDOMINAL HYSTERECTOMY  1999   TAH   ABDOMINAL SURGERY  1999   Abdominoplasty   ACHILLES TENDON SURGERY     AUGMENTATION MAMMAPLASTY     Implants   COLONOSCOPY  2006   INTRAMEDULLARY (IM) NAIL INTERTROCHANTERIC Right 11/26/2018   Procedure(s) Performed: INTRAMEDULLARY (IM) NAIL INTERTROCHANTRIC RIGHT HIP (Right Hip)   INTRAMEDULLARY (IM) NAIL INTERTROCHANTERIC Right 11/26/2018   Procedure: INTRAMEDULLARY (IM)  NAIL INTERTROCHANTRIC RIGHT HIP;  Surgeon: Newt Minion, MD;  Location: [facility];  Service: Orthopedics;  Laterality: Right;   POLYPECTOMY     TONSILLECTOMY AND ADENOIDECTOMY     TUBAL LIGATION       OB History    Gravida  4   Para  2   Term  2   Preterm      AB  2   Living  2     SAB      TAB      Ectopic      Multiple      Live Births               Home Medications    Prior to Admission medications   Medication Sig Start Date End Date Taking? Authorizing Provider    amphetamine-dextroamphetamine (ADDERALL XR) 15 MG 24 hr capsule Take 15 mg by mouth daily.  06/19/18   [provider]  aspirin EC 325 MG EC tablet Take 1 tablet (325 mg total) by mouth daily with breakfast. 12/02/18   Bonnell Public, MD  atorvastatin (LIPITOR) 40 MG tablet TAKE 1 BY MOUTH DAILY Patient taking differently: Take 40 mg by mouth daily.  06/23/14   Noralee Space, MD  b complex vitamins capsule Take 1 capsule by mouth daily. 08/20/18   Brunetta Genera, MD  bisacodyl (DULCOLAX) 5 MG EC tablet Take 1 tablet (5 mg total) by mouth daily as needed for moderate constipation. 12/01/18   Bonnell Public, MD  busPIRone (BUSPAR) 10 MG tablet Take 20 mg by mouth at bedtime.     [provider]  docusate sodium (COLACE) 100 MG capsule Take 1 capsule (100 mg total) by mouth 2 (two) times daily. 12/01/18   Dana Allan I, MD  escitalopram (LEXAPRO) 20 MG tablet Take 20 mg by mouth daily.    [provider]  HYDROcodone-acetaminophen (NORCO) 7.5-325 MG tablet Take 1-2 tablets by mouth every 4 (four) hours as needed for severe pain (pain score 7-10). 12/01/18   Bonnell Public, MD  neomycin-bacitracin-polymyxin (NEOSPORIN) [phone] ointment Apply 1 application topically as needed (for wound care).    [provider]  pantoprazole (PROTONIX) 40 MG tablet Take 40 mg by mouth daily.  06/12/18   [provider]  pindolol (VISKEN) 10 MG tablet Take 10 mg by mouth daily.     [provider]  QUEtiapine (SEROQUEL) 100 MG tablet Take 1 tablet (100 mg total) by mouth at bedtime. 12/01/18   Bonnell Public, MD  QUEtiapine (SEROQUEL) 25 MG tablet Take 1 tablet (25 mg total) by mouth 2 (two) times daily. 12/01/18   Bonnell Public, MD  vitamin C (ASCORBIC ACID) 500 MG tablet Take 500 mg by mouth daily.    [provider]    Family History Family History  Problem Relation Age of Onset   Breast cancer Mother        Age 63's    Diabetes Maternal Grandmother    Aneurysm Maternal Grandmother    Aneurysm Maternal Aunt    Aneurysm Maternal Grandfather    Ankylosing spondylitis Father    Colon cancer Neg Hx    Colon polyps Neg Hx    Esophageal cancer Neg Hx    Rectal cancer Neg Hx    Stomach cancer Neg Hx    Dementia Neg Hx     Social History Social History   Tobacco Use   Smoking status: Former Smoker  Packs/day: 0.90    Types: Cigarettes   Smokeless tobacco: Never Used   Tobacco comment: 10 cigarettes per day as of 04/17/2018. Not smoked since the fall as of 06/11/2018  Substance Use Topics   Alcohol use: Not Currently    Alcohol/week: 0.0 standard drinks    Comment: none since May 2019   Drug use: Not Currently    Types: Marijuana     Allergies   Latex, Nickel, Other, Cobalt, and Eucerin [basis facial moisturizer]   Review of Systems Review of Systems  Unable to perform ROS: Dementia     Physical Exam Updated Vital Signs BP (!) 111/58    Pulse 91    Temp 98.7 F (37.1 C) (Oral)    Resp (!) 24    Ht 5\' 3"  (1.6 m)    Wt 54.4 kg    SpO2 97%    BMI 21.26 kg/m   Physical Exam Vitals signs and nursing note reviewed.  Constitutional:      General: She is not in acute distress.    Appearance: She is well-developed.  HENT:     Head: Normocephalic and atraumatic.     Comments: No Battle's signs, no raccoon's eyes, no rhinorrhea. No hemotympanum. No tenderness to palpation of the face or skull. No deformity, crepitus, or swelling noted.  Eyes:     General:        Right eye: No discharge.        Left eye: No discharge.     Extraocular Movements: Extraocular movements intact.     Conjunctiva/sclera: Conjunctivae normal.     Pupils: Pupils are equal, round, and reactive to light.  Neck:     Musculoskeletal: Neck supple.     Vascular: No JVD.     Trachea: No tracheal deviation.  Cardiovascular:     Rate and Rhythm: Normal rate and regular rhythm.     Pulses: Normal  pulses.     Comments: 2+ DP/PT pulses bilaterally.  1+ pitting edema of the right lower extremity extending into the right foot. Pulmonary:     Effort: Pulmonary effort is normal.  Abdominal:     General: There is no distension.  Musculoskeletal:     Comments: Patient laying supine with both lower extremities flexed at the hip. Significant tenderness to palpation of the right hip, reduced active and passive range of motion of the right hip.  Physical examination is limited due to the extent of her dementia.  Skin:    General: Skin is warm and dry.     Findings: No erythema.  Neurological:     Mental Status: She is alert.     Comments: Oriented to person only.  Follows some commands but not others.  Moves bilateral upper extremities and left lower extremity without difficulty  Psychiatric:        Behavior: Behavior normal.      ED Treatments / Results  Labs (all labs ordered are listed, but only abnormal results are displayed) Labs Reviewed  BASIC METABOLIC PANEL - Abnormal; Notable for the following components:      Result Value   CO2 21 (*)    Glucose, Bld 115 (*)    Calcium 8.7 (*)    All other components within normal limits  CBC WITH DIFFERENTIAL/PLATELET - Abnormal; Notable for the following components:   WBC 14.9 (*)    RBC 3.78 (*)    Hemoglobin 11.4 (*)    Neutro Abs 12.2 (*)    Abs Immature  Granulocytes 0.10 (*)    All other components within normal limits  SARS CORONAVIRUS 2 BY RT PCR (HOSPITAL ORDER, [facility])  URINALYSIS, ROUTINE W REFLEX MICROSCOPIC  CBC    EKG None  Radiology Chest Portable 1 View  Result Date: 12/24/2018 CLINICAL DATA:  Leukocytosis EXAM: PORTABLE CHEST 1 VIEW COMPARISON:  Chest radiograph dated 11/25/2018 FINDINGS: The heart size is normal. Vascular calcifications are seen in the aortic arch. Both lungs are clear. The visualized skeletal structures are unremarkable. IMPRESSION: No active disease.  Electronically Signed   By: Zerita Boers M.D.   On: 12/24/2018 20:50   Dg Hip Unilat  With Pelvis 2-3 Views Right  Result Date: 12/24/2018 CLINICAL DATA:  Hip pain right side EXAM: DG HIP (WITH OR WITHOUT PELVIS) 2-3V RIGHT COMPARISON:  12/08/2018, 11/25/2018 FINDINGS: SI joints are non widened. Pubic symphysis intact. Questionable nondisplaced fracture lucency versus soft tissue artifact over the right inferior pubic ramus. Intramedullary rod and distal screw fixation of previous comminuted right intertrochanteric fracture with displaced lesser trochanteric fracture fragment. Acute periprosthetic fracture involving the proximal shaft of the right femur with about 1 shaft diameter of medial displacement of distal fracture fragment. IMPRESSION: 1. Previous intramedullary rod and screw fixation of comminuted intertrochanteric fracture, now with acute displaced periprosthetic fracture involving the proximal shaft of the femur. No femoral head dislocation. Electronically Signed   By: Donavan Foil M.D.   On: 12/24/2018 16:32    Procedures Procedures (including critical care time)  Medications Ordered in ED Medications  HYDROcodone-acetaminophen (NORCO/VICODIN) 5-325 MG per tablet 1-2 tablet (has no administration in time range)  HYDROmorphone (DILAUDID) injection 0.5 mg (has no administration in time range)  methocarbamol (ROBAXIN) tablet 500 mg (has no administration in time range)    Or  methocarbamol (ROBAXIN) 500 mg in dextrose 5 % 50 mL IVPB (has no administration in time range)  docusate sodium (COLACE) capsule 100 mg (has no administration in time range)  Ipratropium-Albuterol (COMBIVENT) respimat 1 puff (has no administration in time range)  heparin injection 5,000 Units (has no administration in time range)  HYDROmorphone (DILAUDID) injection 0.5 mg (0.5 mg Intravenous Given 12/24/18 1838)     Initial Impression / Assessment and Plan / ED Course  I have reviewed the triage vital signs  and the nursing notes.  Pertinent labs & imaging results that were available during my care of the patient were reviewed by me and considered in my medical decision making (see chart for details).        Patient presents sent from facility for evaluation after fall out of wheelchair.  Patient is afebrile, vital signs are stable.  She is nontoxic in appearance.  She is at her mental status baseline and caregiver at bedside reports that she did not hit her head or lose consciousness.  No evidence of serious head injury on physical examination presently.  No midline spine tenderness.  She has swelling to the right lower extremity and significant pain with any passive movement as well as palpation at the hip.  Radiographs today show an acutely displaced periprosthetic fracture involving the proximal shaft of the femur.  No femoral head dislocation.  CONSULT: Spoke with Dr. Marlou Sa with [facility] orthopedics, plan for hospitalist admission with likely operative repair tomorrow.  Will obtain 2-hour Covid test given impending procedural sedation.  Work reviewed by me shows nonspecific leukocytosis, anemia with hemoglobin [id number], no metabolic derangements, no renal insufficiency.  Chest x-ray shows no acute cardiopulmonary abnormalities.  Spoke with Dr. Marlowe Sax with [facility] hospitalist service who agrees to assume care of patient and bring her into the hospital for further evaluation and management.   Final Clinical Impressions(s) / ED Diagnoses   Final diagnoses:  Leukocytosis  Periprosthetic fracture around internal prosthetic right hip joint, initial encounter [facility])  Fall, initial encounter    ED Discharge Orders    None       Debroah Baller 12/24/18 2112    Daleen Bo, MD 12/27/18 [phone]

## 2018-12-24 NOTE — ED Notes (Signed)
Personal caregiver at bed side.

## 2018-12-25 ENCOUNTER — Other Ambulatory Visit: Payer: Self-pay | Admitting: Physician Assistant

## 2018-12-25 ENCOUNTER — Other Ambulatory Visit: Payer: Self-pay

## 2018-12-25 DIAGNOSIS — M978XXA Periprosthetic fracture around other internal prosthetic joint, initial encounter: Secondary | ICD-10-CM

## 2018-12-25 DIAGNOSIS — M9701XA Periprosthetic fracture around internal prosthetic right hip joint, initial encounter: Secondary | ICD-10-CM

## 2018-12-25 DIAGNOSIS — J449 Chronic obstructive pulmonary disease, unspecified: Secondary | ICD-10-CM

## 2018-12-25 DIAGNOSIS — Z96649 Presence of unspecified artificial hip joint: Secondary | ICD-10-CM

## 2018-12-25 DIAGNOSIS — F0391 Unspecified dementia with behavioral disturbance: Secondary | ICD-10-CM

## 2018-12-25 LAB — CBC
HCT: 32.8 % — ABNORMAL LOW (ref 36.0–46.0)
Hemoglobin: 10.3 g/dL — ABNORMAL LOW (ref 12.0–15.0)
MCH: 29.7 pg (ref 26.0–34.0)
MCHC: 31.4 g/dL (ref 30.0–36.0)
MCV: 94.5 fL (ref 80.0–100.0)
Platelets: 365 10*3/uL (ref 150–400)
RBC: 3.47 MIL/uL — ABNORMAL LOW (ref 3.87–5.11)
RDW: 13.2 % (ref 11.5–15.5)
WBC: 11.4 10*3/uL — ABNORMAL HIGH (ref 4.0–10.5)
nRBC: 0 % (ref 0.0–0.2)

## 2018-12-25 LAB — SURGICAL PCR SCREEN
MRSA, PCR: NEGATIVE
Staphylococcus aureus: NEGATIVE

## 2018-12-25 MED ORDER — MELATONIN 3 MG PO TABS
3.0000 mg | ORAL_TABLET | Freq: Every day | ORAL | Status: DC
  Administered 2018-12-25 – 2019-01-06 (×13): 3 mg via ORAL
  Filled 2018-12-25 (×15): qty 1

## 2018-12-25 MED ORDER — GUAIFENESIN 100 MG/5ML PO SOLN: 200.0000 mg | Freq: Four times a day (QID) | ORAL | Status: DC | PRN

## 2018-12-25 MED ORDER — CEFAZOLIN SODIUM-DEXTROSE 2-4 GM/100ML-% IV SOLN
2.0000 g | INTRAVENOUS | Status: AC
  Administered 2018-12-26: 2 g via INTRAVENOUS
  Filled 2018-12-25: qty 100

## 2018-12-25 MED ORDER — ATORVASTATIN CALCIUM 40 MG PO TABS
40.0000 mg | ORAL_TABLET | Freq: Every day | ORAL | Status: DC
  Administered 2018-12-25 – 2019-01-06 (×13): 40 mg via ORAL
  Filled 2018-12-25 (×13): qty 1

## 2018-12-25 MED ORDER — ENSURE ENLIVE PO LIQD
237.0000 mL | Freq: Every day | ORAL | Status: DC
  Administered 2018-12-25 – 2019-01-01 (×6): 237 mL via ORAL

## 2018-12-25 MED ORDER — PANTOPRAZOLE SODIUM 40 MG PO TBEC
40.0000 mg | DELAYED_RELEASE_TABLET | Freq: Every day | ORAL | Status: DC
  Administered 2018-12-25 – 2019-01-07 (×13): 40 mg via ORAL
  Filled 2018-12-25 (×13): qty 1

## 2018-12-25 MED ORDER — CHLORHEXIDINE GLUCONATE 4 % EX LIQD
60.0000 mL | Freq: Once | CUTANEOUS | Status: AC
  Administered 2018-12-25: 4 via TOPICAL
  Filled 2018-12-25: qty 60

## 2018-12-25 MED ORDER — QUETIAPINE FUMARATE 100 MG PO TABS
100.0000 mg | ORAL_TABLET | Freq: Every day | ORAL | Status: DC
  Administered 2018-12-25 – 2019-01-05 (×10): 100 mg via ORAL
  Filled 2018-12-25 (×13): qty 1

## 2018-12-25 MED ORDER — PINDOLOL 5 MG PO TABS
10.0000 mg | ORAL_TABLET | Freq: Every day | ORAL | Status: DC
  Administered 2018-12-26 – 2018-12-27 (×2): 10 mg via ORAL
  Filled 2018-12-25: qty 1
  Filled 2018-12-25 (×2): qty 2
  Filled 2018-12-25: qty 1
  Filled 2018-12-25 (×3): qty 2

## 2018-12-25 MED ORDER — ESCITALOPRAM OXALATE 10 MG PO TABS
20.0000 mg | ORAL_TABLET | Freq: Every day | ORAL | Status: DC
  Administered 2018-12-25 – 2019-01-07 (×13): 20 mg via ORAL
  Filled 2018-12-25 (×13): qty 2

## 2018-12-25 MED ORDER — BUSPIRONE HCL 10 MG PO TABS
20.0000 mg | ORAL_TABLET | Freq: Every day | ORAL | Status: DC
  Administered 2018-12-25 – 2019-01-05 (×11): 20 mg via ORAL
  Filled 2018-12-25 (×11): qty 2

## 2018-12-25 MED ORDER — QUETIAPINE FUMARATE 25 MG PO TABS
25.0000 mg | ORAL_TABLET | Freq: Three times a day (TID) | ORAL | Status: DC | PRN
  Administered 2018-12-25: 25 mg via ORAL

## 2018-12-25 MED ORDER — POVIDONE-IODINE 10 % EX SWAB
2.0000 "application " | Freq: Once | CUTANEOUS | Status: AC
  Administered 2018-12-25: 2 via TOPICAL

## 2018-12-25 NOTE — H&P (View-Only) (Signed)
ORTHOPAEDIC CONSULTATION  REQUESTING PHYSICIAN: Donne Hazel, MD  Chief Complaint: Pain and deformity right periprosthetic femur fracture  HPI: Desiree Salinas is a 68 y.o. female who presents with right periprosthetic femur fracture.  Patient has advanced dementia and has around-the-clock care she fell from her wheelchair.  She is a month out from intramedullary fixation for right intertrochanteric hip fracture.  Past Medical History:  Diagnosis Date  . ADD (attention deficit disorder)   . Allergy   . Anemia   . Anxiety   . Arthritis   . Bruised rib 2020  . Cataract   . Colon polyps    HYPERPLASTIC POLYP  . COPD (chronic obstructive pulmonary disease) ([place])   . Dementia ([place])   . Depression   . Elevated cholesterol   . Shingles   . STD (sexually transmitted disease)    Condylomata  . Substance abuse ([place])    Stopped drinking one year ago.   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 ABDOMINAL HYSTERECTOMY  1999   TAH  . ABDOMINAL SURGERY  1999   Abdominoplasty  . ACHILLES TENDON SURGERY    . AUGMENTATION MAMMAPLASTY     Implants  . COLONOSCOPY  2006  . INTRAMEDULLARY (IM) NAIL INTERTROCHANTERIC Right 11/26/2018   Procedure(s) Performed: INTRAMEDULLARY (IM) NAIL INTERTROCHANTRIC RIGHT HIP (Right Hip)  . INTRAMEDULLARY (IM) NAIL INTERTROCHANTERIC Right 11/26/2018   Procedure: INTRAMEDULLARY (IM) NAIL INTERTROCHANTRIC RIGHT HIP;  Surgeon: Newt Minion, MD;  Location: [facility];  Service: Orthopedics;  Laterality: Right;  . POLYPECTOMY    . TONSILLECTOMY AND ADENOIDECTOMY    . TUBAL LIGATION     Social History   Socioeconomic History  . Marital status: Divorced    Spouse name: Not on file  . Number of children: 2  . Years of education: College  . Highest education level: Not on file  Occupational History  . Occupation: retired  Scientific laboratory technician  . Financial resource  strain: Not on file  . Food insecurity    Worry: Not on file    Inability: Not on file  . Transportation needs    Medical: Not on file    Non-medical: Not on file  Tobacco Use  . Smoking status: Former Smoker    Packs/day: 0.90    Types: Cigarettes  . Smokeless tobacco: Never Used  . Tobacco comment: 10 cigarettes per day as of 04/17/2018. Not smoked since the fall as of 06/11/2018  Substance and Sexual Activity  . Alcohol use: Not Currently    Alcohol/week: 0.0 standard drinks    Comment: none since May 2019  . Drug use: Not Currently    Types: Marijuana  . Sexual activity: Never    Birth control/protection: Post-menopausal, Surgical    Comment: 1st intercourse 68 yo-More than 5 partners  Lifestyle  . Physical activity    Days per week: Not on file    Minutes per session: Not on file  . Stress: Not on file  Relationships  . Social Herbalist on phone: Not on file    Gets together: Not on file    Attends religious service: Not on file    Active member of club or organization: Not on file    Attends meetings of clubs or organizations: Not on file    Relationship status: Not on file  Other Topics Concern  .  Not on file  Social History Narrative   Lives: alone, has caregiver there 8:00 AM to 10 PM 7 days per week and 24 hour on call. Daughter also lives there     Family History  Problem Relation Age of Onset  . Breast cancer Mother        Age 60's  . Diabetes Maternal Grandmother   . Aneurysm Maternal Grandmother   . Aneurysm Maternal Aunt   . Aneurysm Maternal Grandfather   . Ankylosing spondylitis Father   . Colon cancer Neg Hx   . Colon polyps Neg Hx   . Esophageal cancer Neg Hx   . Rectal cancer Neg Hx   . Stomach cancer Neg Hx   . Dementia Neg Hx    - negative except otherwise stated in the family history section Allergies  Allergen Reactions  . Latex Other (See Comments)    "Allergic," per MAR  . Nickel Hives, Itching and Other (See Comments)     Weeping, also  . Other Other (See Comments)    Patient is "Allergic to metals," per MAR  . Cobalt Rash  . Eucerin [Basis Facial Moisturizer] Hives, Itching, Rash and Other (See Comments)    Eucerin lotion causes an allergic skin reaction   Prior to Admission medications   Medication Sig Start Date End Date Taking? Authorizing Provider  acetaminophen (TYLENOL) 325 MG tablet Take 325 mg by mouth See admin instructions. Take 325 mg by mouth two times a day and an additional 325 mg every six hours as needed for headaches, discomfort, of fevers 99.5-101 F and call MD for a fever >101 F; not to exceed 2,000 mg/24 hours of Tylenol from ALL sources   Yes [provider]  alum & mag hydroxide-simeth (MI-ACID) 200-200-20 MG/5ML suspension Take 30 mLs by mouth every 6 (six) hours as needed for indigestion or heartburn.   Yes [provider]  amphetamine-dextroamphetamine (ADDERALL XR) 15 MG 24 hr capsule Take 15 mg by mouth daily.  06/19/18  Yes [provider]  atorvastatin (LIPITOR) 40 MG tablet TAKE 1 BY MOUTH DAILY Patient taking differently: Take 40 mg by mouth at bedtime.  06/23/14  Yes Noralee Space, MD  B Complex Vitamins (VITAMIN B COMPLEX) TABS Take 1 tablet by mouth daily.   Yes [provider]  busPIRone (BUSPAR) 10 MG tablet Take 20 mg by mouth daily.    Yes [provider]  escitalopram (LEXAPRO) 20 MG tablet Take 20 mg by mouth daily.   Yes [provider]  guaiFENesin (TUSSIN) 100 MG/5ML liquid Take 200 mg by mouth every 6 (six) hours as needed for cough.   Yes [provider]  magnesium hydroxide (MILK OF MAGNESIA) 400 MG/5ML suspension Take 30 mLs by mouth at bedtime as needed for mild constipation.   Yes [provider]  Melatonin 5 MG TABS Take 5 mg by mouth at bedtime.   Yes [provider]  neomycin-bacitracin-polymyxin (NEOSPORIN) [phone] ointment Apply 1 application topically as needed (to affected  areas, for skin tears or abrasions).    Yes [provider]  oxyCODONE (OXY IR/ROXICODONE) 5 MG immediate release tablet Take 2.5 mg by mouth 2 (two) times daily as needed (for pain).   Yes [provider]  pantoprazole (PROTONIX) 40 MG tablet Take 40 mg by mouth daily.  06/12/18  Yes [provider]  pindolol (VISKEN) 10 MG tablet Take 10 mg by mouth daily.    Yes [provider]  QUEtiapine (  SEROQUEL) 100 MG tablet Take 1 tablet (100 mg total) by mouth at bedtime. 12/01/18  Yes Bonnell Public, MD  QUEtiapine (SEROQUEL) 25 MG tablet Take 1 tablet (25 mg total) by mouth 2 (two) times daily. Patient taking differently: Take 25 mg by mouth 3 (three) times daily as needed (for agitation).  12/01/18  Yes Bonnell Public, MD  vitamin C (ASCORBIC ACID) 500 MG tablet Take 500 mg by mouth daily.   Yes [provider]  aspirin EC 325 MG EC tablet Take 1 tablet (325 mg total) by mouth daily with breakfast. Patient not taking: Reported on 12/24/2018 12/02/18   Dana Allan I, MD  b complex vitamins capsule Take 1 capsule by mouth daily. Patient not taking: Reported on 12/24/2018 08/20/18   Brunetta Genera, MD  bisacodyl (DULCOLAX) 5 MG EC tablet Take 1 tablet (5 mg total) by mouth daily as needed for moderate constipation. Patient not taking: Reported on 12/24/2018 12/01/18   Dana Allan I, MD  docusate sodium (COLACE) 100 MG capsule Take 1 capsule (100 mg total) by mouth 2 (two) times daily. Patient not taking: Reported on 12/24/2018 12/01/18   Dana Allan I, MD  HYDROcodone-acetaminophen (NORCO) 7.5-325 MG tablet Take 1-2 tablets by mouth every 4 (four) hours as needed for severe pain (pain score 7-10). Patient not taking: Reported on 12/24/2018 12/01/18   Bonnell Public, MD   Chest Portable 1 View  Result Date: 12/24/2018 CLINICAL DATA:  Leukocytosis EXAM: PORTABLE CHEST 1 VIEW COMPARISON:  Chest radiograph dated 11/25/2018  FINDINGS: The heart size is normal. Vascular calcifications are seen in the aortic arch. Both lungs are clear. The visualized skeletal structures are unremarkable. IMPRESSION: No active disease. Electronically Signed   By: Zerita Boers M.D.   On: 12/24/2018 20:50   Dg Hip Unilat  With Pelvis 2-3 Views Right  Result Date: 12/24/2018 CLINICAL DATA:  Hip pain right side EXAM: DG HIP (WITH OR WITHOUT PELVIS) 2-3V RIGHT COMPARISON:  12/08/2018, 11/25/2018 FINDINGS: SI joints are non widened. Pubic symphysis intact. Questionable nondisplaced fracture lucency versus soft tissue artifact over the right inferior pubic ramus. Intramedullary rod and distal screw fixation of previous comminuted right intertrochanteric fracture with displaced lesser trochanteric fracture fragment. Acute periprosthetic fracture involving the proximal shaft of the right femur with about 1 shaft diameter of medial displacement of distal fracture fragment. IMPRESSION: 1. Previous intramedullary rod and screw fixation of comminuted intertrochanteric fracture, now with acute displaced periprosthetic fracture involving the proximal shaft of the femur. No femoral head dislocation. Electronically Signed   By: Donavan Foil M.D.   On: 12/24/2018 16:32   - pertinent xrays, CT, MRI studies were reviewed and independently interpreted  Positive ROS: All other systems have been reviewed and were otherwise negative with the exception of those mentioned in the HPI and as above.  Physical Exam: General: Alert, no acute distress Psychiatric: Patient is competent for consent with normal mood and affect Lymphatic: No axillary or cervical lymphadenopathy Cardiovascular: No pedal edema Respiratory: No cyanosis, no use of accessory musculature GI: No organomegaly, abdomen is soft and non-tender    Images:  @ENCIMAGES @  Labs:  Lab Results  Component Value Date   REPTSTATUS 11/15/2018 FINAL 11/13/2018   CULT  11/13/2018    NO GROWTH  Performed at [facility] [address], [place], [place] [address]    LABORGA NO GROWTH 03/14/2015    Lab Results  Component Value Date   ALBUMIN 2.3 (L) 11/30/2018  ALBUMIN 3.5 11/25/2018   ALBUMIN 3.5 11/13/2018    Neurologic: Patient does not have protective sensation bilateral lower extremities.   MUSCULOSKELETAL:   Skin: Examination of skin is intact.  Patient's right lower extremity is shortened and internally rotated she has good capillary refill.  Radiographs shows a femur fracture distal to the femoral nail.  Patient has severe protein caloric malnutrition with an albumin of 2.3.  Assessment: Assessment right femoral shaft fracture distal to the intramedullary nail that was placed 1 month ago for an intertrochanteric hip fracture with periprosthetic femur fracture.  Plan: Plan: We will place patient in Buck's traction plan for removal of the intramedullary nail and placement of a new intramedullary nail that will transverse both fracture sites.  Will obtain consent from patient's sister.  Thank you for the consult and the opportunity to see Ms. [name], [place] [phone] 7:20 AM

## 2018-12-25 NOTE — Progress Notes (Signed)
Initial Nutrition Assessment  DOCUMENTATION CODES:   Not applicable  INTERVENTION:  Provide Ensure Enlive po once daily, each supplement provides 350 kcal and 20 grams of protein  Encourage adequate PO intake.   NUTRITION DIAGNOSIS:   Increased nutrient needs related to post-op healing as evidenced by estimated needs.  GOAL:   Patient will meet greater than or equal to 90% of their needs  MONITOR:   PO intake, Supplement acceptance, Labs, I & O's, Skin, Weight trends  REASON FOR ASSESSMENT:   Malnutrition Screening Tool    ASSESSMENT:   68 y.o. female with medical history significant of dementia, anemia, anxiety, arthritis, COPD, depression, hyperlipidemia, hospital admission 9/22-9/28 for intertrochanteric fracture of right hip status post intramedullary nailing by Dr. Sharol Given on 9/23 presenting from her nursing home with complaints of right hip pain s/p fall. X-ray of right hip and pelvis showing previous intramedullary rod and screw fixation of comminuted intertrochanteric fracture, now with acute displaced periprosthetic fracture involving the proximal shaft of the femur.  Pt somnolent during time of visit. Caregiver at bedside. She reports pt eats well PTA with usual consumption of at least 3 meals a day with no difficulties. Current meal completion 75-100%. Per weight records, pt with a 8% weight loss in 1 month. RD to order nutritional supplements to aid in caloric and protein needs as well as in healing. Labs and medications reviewed.     NUTRITION - FOCUSED PHYSICAL EXAM:    Most Recent Value  Orbital Region  Unable to assess  Upper Arm Region  No depletion  Thoracic and Lumbar Region  No depletion  Buccal Region  Unable to assess  Temple Region  Unable to assess  Clavicle Bone Region  No depletion  Clavicle and Acromion Bone Region  No depletion  Scapular Bone Region  No depletion  Dorsal Hand  Unable to assess  Patellar Region  No depletion  Anterior Thigh  Region  No depletion  Posterior Calf Region  No depletion  Edema (RD Assessment)  Mild  Hair  Reviewed  Eyes  Unable to assess  Mouth  Unable to assess  Skin  Reviewed  Nails  Unable to assess       Diet Order:   Diet Order            Diet Heart Room service appropriate? Yes; Fluid consistency: Thin  Diet effective now              EDUCATION NEEDS:   Not appropriate for education at this time  Skin:  Skin Assessment: Reviewed RN Assessment  Last BM:  10/21  Height:   Ht Readings from Last 1 Encounters:  12/24/18 5\' 3"  (1.6 m)    Weight:   Wt Readings from Last 1 Encounters:  12/24/18 54.4 kg    Ideal Body Weight:  52.27 kg  BMI:  Body mass index is 21.26 kg/m.  Estimated Nutritional Needs:   Kcal:  1650-1850  Protein:  70-85 grams  Fluid:  1.6 - 1.8 L/day    Corrin Parker, MS, RD, LDN Pager # [phone] After hours/ weekend pager # [phone]

## 2018-12-25 NOTE — Social Work (Signed)
Pt is from [facility] which is a memory care ALF.  Please consider ordering post op PT/OT to assess pt care needs. Pt does NOT have a legal guardian will discontinue that banner, hwoever she does have documentation of POA which is her sister Viann Fish) at [phone].  Westley Hummer, MSW, [place] Work [phone]

## 2018-12-25 NOTE — ED Notes (Signed)
ED TO INPATIENT HANDOFF REPORT  ED Nurse Name and Phone #: Kathie Rhodes RN  S Name/Age/Gender Desiree Salinas 68 y.o. female Room/Bed: 043C/043C  Code Status   Code Status: Full Code  Home/SNF/Other Skilled nursing facility Patient oriented to: self, time and situation Is this baseline? Yes   Triage Complete: Triage complete  Chief Complaint hip pain  Triage Note PER EMS: pt from [facility] with c/o right side hip pain. She was attempting to get out of her wheelchair when she assisted to the ground, did not not physically fall though. No injuries sustained but nursing home is requesting her to be seen due to her hip pain but she did have recent hip surgery a month ago. She has dementia and is at baseline. BP- 126/82, HR-84, RR-16, O2-98%.    Allergies Allergies  Allergen Reactions  . Latex Other (See Comments)    "Allergic," per MAR  . Nickel Hives, Itching and Other (See Comments)    Weeping, also  . Other Other (See Comments)    Patient is "Allergic to metals," per MAR  . Cobalt Rash  . Eucerin [Basis Facial Moisturizer] Hives, Itching, Rash and Other (See Comments)    Eucerin lotion causes an allergic skin reaction    Level of Care/Admitting Diagnosis ED Disposition    ED Disposition Condition [facility] Area: [place] [[id number]]  Level of Care: Med-Surg [16]  Covid Evaluation: Asymptomatic Screening Protocol (No Symptoms)  Diagnosis: Periprosthetic fracture of hip [id number]  Admitting Physician: Shela Leff [id number]  Attending Physician: Shela Leff [id number]  Estimated length of stay: past midnight tomorrow  Certification:: I certify this patient will need inpatient services for at least 2 midnights  PT Class (Do Not Modify): Inpatient [101]  PT Acc Code (Do Not Modify): Private [1]       B Medical/Surgery History Past Medical History:  Diagnosis Date  . ADD (attention deficit disorder)   . Allergy   .  Anemia   . Anxiety   . Arthritis   . Bruised rib 2020  . Cataract   . Colon polyps    HYPERPLASTIC POLYP  . COPD (chronic obstructive pulmonary disease) ([place])   . Dementia ([place])   . Depression   . Elevated cholesterol   . Shingles   . STD (sexually transmitted disease)    Condylomata  . Substance abuse ([place])    Stopped drinking one year ago.   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 ABDOMINAL HYSTERECTOMY  1999   TAH  . ABDOMINAL SURGERY  1999   Abdominoplasty  . ACHILLES TENDON SURGERY    . AUGMENTATION MAMMAPLASTY     Implants  . COLONOSCOPY  2006  . INTRAMEDULLARY (IM) NAIL INTERTROCHANTERIC Right 11/26/2018   Procedure(s) Performed: INTRAMEDULLARY (IM) NAIL INTERTROCHANTRIC RIGHT HIP (Right Hip)  . INTRAMEDULLARY (IM) NAIL INTERTROCHANTERIC Right 11/26/2018   Procedure: INTRAMEDULLARY (IM) NAIL INTERTROCHANTRIC RIGHT HIP;  Surgeon: Newt Minion, MD;  Location: [facility];  Service: Orthopedics;  Laterality: Right;  . POLYPECTOMY    . TONSILLECTOMY AND ADENOIDECTOMY    . TUBAL LIGATION       A IV Location/Drains/Wounds Patient Lines/Drains/Airways Status   Active Line/Drains/Airways    Name:   Placement date:   Placement time:   Site:   Days:   Peripheral IV 12/24/18 Right Wrist   12/24/18  1836    Wrist   1   Incision (Closed) 11/26/18 Hip Right   11/26/18    1353     29          Intake/Output Last 24 hours No intake or output data in the 24 hours ending 12/25/18 0137  Labs/Imaging Results for orders placed or performed during the hospital encounter of 12/24/18 (from the past 48 hour(s))  Basic metabolic panel     Status: Abnormal   Collection Time: 12/24/18  6:37 PM  Result Value Ref Range   Sodium 137 135 - 145 mmol/L   Potassium 4.3 3.5 - 5.1 mmol/L   Chloride 103 98 - 111 mmol/L   CO2 21 (L) 22 - 32 mmol/L   Glucose, Bld 115 (H) 70 - 99 mg/dL   BUN 13  8 - 23 mg/dL   Creatinine, Ser 0.87 0.44 - 1.00 mg/dL   Calcium 8.7 (L) 8.9 - 10.3 mg/dL   GFR calc non Af Amer >60 >60 mL/min   GFR calc Af Amer >60 >60 mL/min   Anion gap 13 5 - 15    Comment: Performed at [facility], [place] [address], [place], [place] [address]  CBC with Differential     Status: Abnormal   Collection Time: 12/24/18  6:37 PM  Result Value Ref Range   WBC 14.9 (H) 4.0 - 10.5 K/uL   RBC 3.78 (L) 3.87 - 5.11 MIL/uL   Hemoglobin 11.4 (L) 12.0 - 15.0 g/dL   HCT 36.1 36.0 - 46.0 %   MCV 95.5 80.0 - 100.0 fL   MCH 30.2 26.0 - 34.0 pg   MCHC 31.6 30.0 - 36.0 g/dL   RDW 13.2 11.5 - 15.5 %   Platelets 351 150 - 400 K/uL   nRBC 0.0 0.0 - 0.2 %   Neutrophils Relative % 81 %   Neutro Abs 12.2 (H) 1.7 - 7.7 K/uL   Lymphocytes Relative 10 %   Lymphs Abs 1.5 0.7 - 4.0 K/uL   Monocytes Relative 6 %   Monocytes Absolute 0.8 0.1 - 1.0 K/uL   Eosinophils Relative 2 %   Eosinophils Absolute 0.2 0.0 - 0.5 K/uL   Basophils Relative 0 %   Basophils Absolute 0.1 0.0 - 0.1 K/uL   Immature Granulocytes 1 %   Abs Immature Granulocytes 0.10 (H) 0.00 - 0.07 K/uL    Comment: Performed at [facility] [address] [place], [place] [address]  SARS Coronavirus 2 by RT PCR (hospital order, performed in [facility] hospital lab) Nasopharyngeal Nasopharyngeal Swab     Status: None   Collection Time: 12/24/18  8:44 PM   Specimen: Nasopharyngeal Swab  Result Value Ref Range   SARS Coronavirus 2 NEGATIVE NEGATIVE    Comment: (NOTE) If result is NEGATIVE SARS-CoV-2 target nucleic acids are NOT DETECTED. The SARS-CoV-2 RNA is generally detectable in upper and lower  respiratory specimens during the acute phase of infection. The lowest  concentration of SARS-CoV-2 viral copies this assay can detect is 250  copies / mL. A negative result does not preclude SARS-CoV-2 infection  and should not be used as the sole basis for treatment or other  patient management decisions.  A  negative result may occur with  improper specimen collection / handling, submission of specimen other  than nasopharyngeal swab, presence of viral mutation(s) within the  areas targeted by this assay, and inadequate number of viral copies  (<250 copies / mL). A negative  result must be combined with clinical  observations, patient history, and epidemiological information. If result is POSITIVE SARS-CoV-2 target nucleic acids are DETECTED. The SARS-CoV-2 RNA is generally detectable in upper and lower  respiratory specimens dur ing the acute phase of infection.  Positive  results are indicative of active infection with SARS-CoV-2.  Clinical  correlation with patient history and other diagnostic information is  necessary to determine patient infection status.  Positive results do  not rule out bacterial infection or co-infection with other viruses. If result is PRESUMPTIVE POSTIVE SARS-CoV-2 nucleic acids MAY BE PRESENT.   A presumptive positive result was obtained on the submitted specimen  and confirmed on repeat testing.  While 2019 novel coronavirus  (SARS-CoV-2) nucleic acids may be present in the submitted sample  additional confirmatory testing may be necessary for epidemiological  and / or clinical management purposes  to differentiate between  SARS-CoV-2 and other Sarbecovirus currently known to infect humans.  If clinically indicated additional testing with an alternate test  methodology [phone]) is advised. The SARS-CoV-2 RNA is generally  detectable in upper and lower respiratory sp ecimens during the acute  phase of infection. The expected result is Negative. Fact Sheet for Patients:  StrictlyIdeas.no Fact Sheet for Healthcare Providers: BankingDealers.co.za This test is not yet approved or cleared by the [place] FDA and has been authorized for detection and/or diagnosis of SARS-CoV-2 by FDA under an Emergency Use  Authorization (EUA).  This EUA will remain in effect (meaning this test can be used) for the duration of the COVID-19 declaration under Section 564(b)(1) of the Act, 21 U.S.C. section 360bbb-3(b)(1), unless the authorization is terminated or revoked sooner. Performed at [facility], [place] [address], [place], [place] [address]    Chest Portable 1 View  Result Date: 12/24/2018 CLINICAL DATA:  Leukocytosis EXAM: PORTABLE CHEST 1 VIEW COMPARISON:  Chest radiograph dated 11/25/2018 FINDINGS: The heart size is normal. Vascular calcifications are seen in the aortic arch. Both lungs are clear. The visualized skeletal structures are unremarkable. IMPRESSION: No active disease. Electronically Signed   By: Zerita Boers M.D.   On: 12/24/2018 20:50   Dg Hip Unilat  With Pelvis 2-3 Views Right  Result Date: 12/24/2018 CLINICAL DATA:  Hip pain right side EXAM: DG HIP (WITH OR WITHOUT PELVIS) 2-3V RIGHT COMPARISON:  12/08/2018, 11/25/2018 FINDINGS: SI joints are non widened. Pubic symphysis intact. Questionable nondisplaced fracture lucency versus soft tissue artifact over the right inferior pubic ramus. Intramedullary rod and distal screw fixation of previous comminuted right intertrochanteric fracture with displaced lesser trochanteric fracture fragment. Acute periprosthetic fracture involving the proximal shaft of the right femur with about 1 shaft diameter of medial displacement of distal fracture fragment. IMPRESSION: 1. Previous intramedullary rod and screw fixation of comminuted intertrochanteric fracture, now with acute displaced periprosthetic fracture involving the proximal shaft of the femur. No femoral head dislocation. Electronically Signed   By: Donavan Foil M.D.   On: 12/24/2018 16:32    Pending Labs Unresulted Labs (From admission, onward)    Start     Ordered   12/25/18 0500  CBC  Tomorrow morning,   R     12/24/18 2012   12/24/18 1744  Urinalysis, Routine w reflex microscopic   Once,   STAT     12/24/18 1743          Vitals/Pain Today's Vitals   12/24/18 2130 12/24/18 2145 12/24/18 2200 12/24/18 2300  BP: 112/75 115/69 136/68 132/70  Pulse: 89 92  94 94  Resp: (!) 25 (!) 21 17 14   Temp:      TempSrc:      SpO2: 97% 96% 96% 96%  Weight:      Height:      PainSc:        Isolation Precautions No active isolations  Medications Medications  HYDROcodone-acetaminophen (NORCO/VICODIN) 5-325 MG per tablet 1-2 tablet (has no administration in time range)  HYDROmorphone (DILAUDID) injection 0.5 mg (has no administration in time range)  methocarbamol (ROBAXIN) tablet 500 mg (has no administration in time range)    Or  methocarbamol (ROBAXIN) 500 mg in dextrose 5 % 50 mL IVPB (has no administration in time range)  docusate sodium (COLACE) capsule 100 mg (has no administration in time range)  heparin injection 5,000 Units (has no administration in time range)  ipratropium-albuterol (DUONEB) 0.5-2.5 (3) MG/3ML nebulizer solution 3 mL (has no administration in time range)  HYDROmorphone (DILAUDID) injection 0.5 mg (0.5 mg Intravenous Given 12/24/18 1838)    Mobility Broken hip High fall risk   Focused Assessments Cardiac Assessment Handoff:    No results found for: CKTOTAL, CKMB, CKMBINDEX, TROPONINI No results found for: DDIMER Does the Patient currently have chest pain? No      R Recommendations: See Admitting Provider Note  Report given to:   Additional Notes: hx ofdementia, resting comfortably

## 2018-12-25 NOTE — Progress Notes (Signed)
PROGRESS NOTE    Desiree Salinas  [id number] DOB: 04/18/1950 DOA: 12/24/2018 PCP: [name], MD    Brief Narrative:  68 y.o. female with medical history significant of dementia, anemia, anxiety, arthritis, COPD, depression, hyperlipidemia, hospital admission 9/22-9/28 for intertrochanteric fracture of right hip status post intramedullary nailing by Dr. Sharol Given on 9/23 presenting from her nursing home with complaints of right hip pain.  Patient is somnolent after receiving fentanyl for pain.  No history could be obtained from her.  Per caregiver at bedside, this morning patient slipped out of her wheelchair while staff at the nursing home were helping her take a bath.  She did not hit her head or lose consciousness.  Patient has been complaining of pain in her right hip.  ED Course: Hemodynamically stable.  White blood cell count 14.9.  Hemoglobin 11.4, stable compared to prior labs.  UA pending.  SARS-CoV-2 test pending.  X-ray of right hip and pelvis showing previous intramedullary rod and screw fixation of comminuted intertrochanteric fracture, now with acute displaced periprosthetic fracture involving the proximal shaft of the femur.  No femoral head dislocation.  Assessment & Plan:   Principal Problem:   Periprosthetic fracture of hip Active Problems:   Leukocytosis   COPD (chronic obstructive pulmonary disease) (HCC)  R hip periprosthetic fracture - X-ray of right hip and pelvis showing previous intramedullary rod and screw fixation of comminuted intertrochanteric fracture, now with acute displaced periprosthetic fracture involving the proximal shaft of the femur.   -No femoral head dislocation.  -Orthopedic Surgery following. Plans for surgery 10/23 -Continue with analgesics as needed  Leukocytosis -Patient has remained afebrile.  -White blood cell count 14.9, improved to 11.4k overnight. Likely secondary to  following acute fracture -Chest x-ray reviewed, clear -UA  pending -Repeat cbc in AM  COPD -Stable. -On minimal O2 support. No wheezing appreciated -Continue Combivent as needed.  Dementia with Aggressive behavior -Seems stable at this time -Will resume home meds as tolerated -Recent EKG reviewed, QTc noted to be 452  HLD -Will resume home statin  DVT prophylaxis: Heparin subQ Code Status: Full Family Communication: Pt in room, family over phone Disposition Plan: Uncertain at this time  Consultants:   Orthopedic Surgery  Procedures:     Antimicrobials: Anti-infectives (From admission, onward)   None       Subjective: Unable to assess given mentation  Objective: Vitals:   12/25/18 0213 12/25/18 0424 12/25/18 0807 12/25/18 1334  BP: 138/69 129/71 108/62 110/68  Pulse: 95 93 92 93  Resp: 20 18 14 16   Temp: (!) 100.4 F (38 C) 98.5 F (36.9 C) 98.9 F (37.2 C) 98.8 F (37.1 C)  TempSrc: Oral Oral Oral Oral  SpO2: 96% 97% 93% 96%  Weight:      Height:        Intake/Output Summary (Last 24 hours) at 12/25/2018 1629 Last data filed at 12/25/2018 1400 Gross per 24 hour  Intake 340 ml  Output 0 ml  Net 340 ml   Filed Weights   12/24/18 1344  Weight: 54.4 kg    Examination:  General exam: Appears calm and comfortable  Respiratory system: Clear to auscultation. Respiratory effort normal. Cardiovascular system: S1 & S2 heard, RRR Gastrointestinal system: Abdomen is nondistended, soft and nontender. No organomegaly or masses felt. Normal bowel sounds heard. Central nervous system: asleep. No focal neurological deficits. Extremities: Symmetric 5 x 5 power. Skin: No rashes, lesions Psychiatry: Unable to assess given current level of mentation  Data Reviewed:  I have personally reviewed following labs and imaging studies  CBC: Recent Labs  Lab 12/24/18 1837 12/25/18 0841  WBC 14.9* 11.4*  NEUTROABS 12.2*  --   HGB 11.4* 10.3*  HCT 36.1 32.8*  MCV 95.5 94.5  PLT 351 [id number]   Basic Metabolic Panel:  Recent Labs  Lab 12/24/18 1837  NA 137  K 4.3  CL 103  CO2 21*  GLUCOSE 115*  BUN 13  CREATININE 0.87  CALCIUM 8.7*   GFR: Estimated Creatinine Clearance: 51.9 mL/min (by C-G formula based on SCr of 0.87 mg/dL). Liver Function Tests: No results for input(s): AST, ALT, ALKPHOS, BILITOT, PROT, ALBUMIN in the last 168 hours. No results for input(s): LIPASE, AMYLASE in the last 168 hours. No results for input(s): AMMONIA in the last 168 hours. Coagulation Profile: No results for input(s): INR, PROTIME in the last 168 hours. Cardiac Enzymes: No results for input(s): CKTOTAL, CKMB, CKMBINDEX, TROPONINI in the last 168 hours. BNP (last 3 results) No results for input(s): PROBNP in the last 8760 hours. HbA1C: No results for input(s): HGBA1C in the last 72 hours. CBG: No results for input(s): GLUCAP in the last 168 hours. Lipid Profile: No results for input(s): CHOL, HDL, LDLCALC, TRIG, CHOLHDL, LDLDIRECT in the last 72 hours. Thyroid Function Tests: No results for input(s): TSH, T4TOTAL, FREET4, T3FREE, THYROIDAB in the last 72 hours. Anemia Panel: No results for input(s): VITAMINB12, FOLATE, FERRITIN, TIBC, IRON, RETICCTPCT in the last 72 hours. Sepsis Labs: No results for input(s): PROCALCITON, LATICACIDVEN in the last 168 hours.  Recent Results (from the past 240 hour(s))  SARS Coronavirus 2 by RT PCR (hospital order, performed in [facility] hospital lab) Nasopharyngeal Nasopharyngeal Swab     Status: None   Collection Time: 12/24/18  8:44 PM   Specimen: Nasopharyngeal Swab  Result Value Ref Range Status   SARS Coronavirus 2 NEGATIVE NEGATIVE Final    Comment: (NOTE) If result is NEGATIVE SARS-CoV-2 target nucleic acids are NOT DETECTED. The SARS-CoV-2 RNA is generally detectable in upper and lower  respiratory specimens during the acute phase of infection. The lowest  concentration of SARS-CoV-2 viral copies this assay can detect is 250  copies / mL. A negative result  does not preclude SARS-CoV-2 infection  and should not be used as the sole basis for treatment or other  patient management decisions.  A negative result may occur with  improper specimen collection / handling, submission of specimen other  than nasopharyngeal swab, presence of viral mutation(s) within the  areas targeted by this assay, and inadequate number of viral copies  (<250 copies / mL). A negative result must be combined with clinical  observations, patient history, and epidemiological information. If result is POSITIVE SARS-CoV-2 target nucleic acids are DETECTED. The SARS-CoV-2 RNA is generally detectable in upper and lower  respiratory specimens dur ing the acute phase of infection.  Positive  results are indicative of active infection with SARS-CoV-2.  Clinical  correlation with patient history and other diagnostic information is  necessary to determine patient infection status.  Positive results do  not rule out bacterial infection or co-infection with other viruses. If result is PRESUMPTIVE POSTIVE SARS-CoV-2 nucleic acids MAY BE PRESENT.   A presumptive positive result was obtained on the submitted specimen  and confirmed on repeat testing.  While 2019 novel coronavirus  (SARS-CoV-2) nucleic acids may be present in the submitted sample  additional confirmatory testing may be necessary for epidemiological  and / or clinical management purposes  to differentiate between  SARS-CoV-2 and other Sarbecovirus currently known to infect humans.  If clinically indicated additional testing with an alternate test  methodology [id number]) is advised. The SARS-CoV-2 RNA is generally  detectable in upper and lower respiratory sp ecimens during the acute  phase of infection. The expected result is Negative. Fact Sheet for Patients:  StrictlyIdeas.no Fact Sheet for Healthcare Providers: BankingDealers.co.za This test is not yet approved or  cleared by the [place] FDA and has been authorized for detection and/or diagnosis of SARS-CoV-2 by FDA under an Emergency Use Authorization (EUA).  This EUA will remain in effect (meaning this test can be used) for the duration of the COVID-19 declaration under Section 564(b)(1) of the Act, 21 U.S.C. section 360bbb-3(b)(1), unless the authorization is terminated or revoked sooner. Performed at [facility], [place] [address] [place], [place] [address]   Surgical pcr screen     Status: None   Collection Time: 12/25/18  3:44 AM   Specimen: Nasal Swab  Result Value Ref Range Status   MRSA, PCR NEGATIVE NEGATIVE Final   Staphylococcus aureus NEGATIVE NEGATIVE Final    Comment: (NOTE) The Xpert SA Assay (FDA approved for NASAL specimens in patients 55 years of age and older), is one component of a comprehensive surveillance program. It is not intended to diagnose infection nor to guide or monitor treatment. Performed at [facility], [place] [address] [place], [place] [address]      Radiology Studies: Chest Portable 1 View  Result Date: 12/24/2018 CLINICAL DATA:  Leukocytosis EXAM: PORTABLE CHEST 1 VIEW COMPARISON:  Chest radiograph dated 11/25/2018 FINDINGS: The heart size is normal. Vascular calcifications are seen in the aortic arch. Both lungs are clear. The visualized skeletal structures are unremarkable. IMPRESSION: No active disease. Electronically Signed   By: Zerita Boers M.D.   On: 12/24/2018 20:50   Dg Hip Unilat  With Pelvis 2-3 Views Right  Result Date: 12/24/2018 CLINICAL DATA:  Hip pain right side EXAM: DG HIP (WITH OR WITHOUT PELVIS) 2-3V RIGHT COMPARISON:  12/08/2018, 11/25/2018 FINDINGS: SI joints are non widened. Pubic symphysis intact. Questionable nondisplaced fracture lucency versus soft tissue artifact over the right inferior pubic ramus. Intramedullary rod and distal screw fixation of previous comminuted right intertrochanteric fracture with  displaced lesser trochanteric fracture fragment. Acute periprosthetic fracture involving the proximal shaft of the right femur with about 1 shaft diameter of medial displacement of distal fracture fragment. IMPRESSION: 1. Previous intramedullary rod and screw fixation of comminuted intertrochanteric fracture, now with acute displaced periprosthetic fracture involving the proximal shaft of the femur. No femoral head dislocation. Electronically Signed   By: Donavan Foil M.D.   On: 12/24/2018 16:32    Scheduled Meds: . docusate sodium  100 mg Oral BID  . feeding supplement (ENSURE ENLIVE)  237 mL Oral Q1500  . heparin injection (subcutaneous)  5,000 Units Subcutaneous Q8H   Continuous Infusions: . methocarbamol (ROBAXIN) IV       LOS: 1 day   Marylu Lund, MD [facility] Pager On Amion  If 7PM-7AM, please contact night-coverage 12/25/2018, 4:29 PM

## 2018-12-25 NOTE — Consult Note (Signed)
ORTHOPAEDIC CONSULTATION  REQUESTING PHYSICIAN: Donne Hazel, MD  Chief Complaint: Pain and deformity right periprosthetic femur fracture  HPI: Desiree Salinas is a 68 y.o. female who presents with right periprosthetic femur fracture.  Patient has advanced dementia and has around-the-clock care she fell from her wheelchair.  She is a month out from intramedullary fixation for right intertrochanteric hip fracture.  Past Medical History:  Diagnosis Date  . ADD (attention deficit disorder)   . Allergy   . Anemia   . Anxiety   . Arthritis   . Bruised rib 2020  . Cataract   . Colon polyps    HYPERPLASTIC POLYP  . COPD (chronic obstructive pulmonary disease) ([place])   . Dementia ([place])   . Depression   . Elevated cholesterol   . Shingles   . STD (sexually transmitted disease)    Condylomata  . Substance abuse ([place])    Stopped drinking one year ago.   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 ABDOMINAL HYSTERECTOMY  1999   TAH  . ABDOMINAL SURGERY  1999   Abdominoplasty  . ACHILLES TENDON SURGERY    . AUGMENTATION MAMMAPLASTY     Implants  . COLONOSCOPY  2006  . INTRAMEDULLARY (IM) NAIL INTERTROCHANTERIC Right 11/26/2018   Procedure(s) Performed: INTRAMEDULLARY (IM) NAIL INTERTROCHANTRIC RIGHT HIP (Right Hip)  . INTRAMEDULLARY (IM) NAIL INTERTROCHANTERIC Right 11/26/2018   Procedure: INTRAMEDULLARY (IM) NAIL INTERTROCHANTRIC RIGHT HIP;  Surgeon: Newt Minion, MD;  Location: [facility];  Service: Orthopedics;  Laterality: Right;  . POLYPECTOMY    . TONSILLECTOMY AND ADENOIDECTOMY    . TUBAL LIGATION     Social History   Socioeconomic History  . Marital status: Divorced    Spouse name: Not on file  . Number of children: 2  . Years of education: College  . Highest education level: Not on file  Occupational History  . Occupation: retired  Scientific laboratory technician  . Financial resource  strain: Not on file  . Food insecurity    Worry: Not on file    Inability: Not on file  . Transportation needs    Medical: Not on file    Non-medical: Not on file  Tobacco Use  . Smoking status: Former Smoker    Packs/day: 0.90    Types: Cigarettes  . Smokeless tobacco: Never Used  . Tobacco comment: 10 cigarettes per day as of 04/17/2018. Not smoked since the fall as of 06/11/2018  Substance and Sexual Activity  . Alcohol use: Not Currently    Alcohol/week: 0.0 standard drinks    Comment: none since May 2019  . Drug use: Not Currently    Types: Marijuana  . Sexual activity: Never    Birth control/protection: Post-menopausal, Surgical    Comment: 1st intercourse 68 yo-More than 5 partners  Lifestyle  . Physical activity    Days per week: Not on file    Minutes per session: Not on file  . Stress: Not on file  Relationships  . Social Herbalist on phone: Not on file    Gets together: Not on file    Attends religious service: Not on file    Active member of club or organization: Not on file    Attends meetings of clubs or organizations: Not on file    Relationship status: Not on file  Other Topics Concern  .  Not on file  Social History Narrative   Lives: alone, has caregiver there 8:00 AM to 10 PM 7 days per week and 24 hour on call. Daughter also lives there     Family History  Problem Relation Age of Onset  . Breast cancer Mother        Age 19's  . Diabetes Maternal Grandmother   . Aneurysm Maternal Grandmother   . Aneurysm Maternal Aunt   . Aneurysm Maternal Grandfather   . Ankylosing spondylitis Father   . Colon cancer Neg Hx   . Colon polyps Neg Hx   . Esophageal cancer Neg Hx   . Rectal cancer Neg Hx   . Stomach cancer Neg Hx   . Dementia Neg Hx    - negative except otherwise stated in the family history section Allergies  Allergen Reactions  . Latex Other (See Comments)    "Allergic," per MAR  . Nickel Hives, Itching and Other (See Comments)     Weeping, also  . Other Other (See Comments)    Patient is "Allergic to metals," per MAR  . Cobalt Rash  . Eucerin [Basis Facial Moisturizer] Hives, Itching, Rash and Other (See Comments)    Eucerin lotion causes an allergic skin reaction   Prior to Admission medications   Medication Sig Start Date End Date Taking? Authorizing Provider  acetaminophen (TYLENOL) 325 MG tablet Take 325 mg by mouth See admin instructions. Take 325 mg by mouth two times a day and an additional 325 mg every six hours as needed for headaches, discomfort, of fevers 99.5-101 F and call MD for a fever >101 F; not to exceed 2,000 mg/24 hours of Tylenol from ALL sources   Yes [provider]  alum & mag hydroxide-simeth (MI-ACID) 200-200-20 MG/5ML suspension Take 30 mLs by mouth every 6 (six) hours as needed for indigestion or heartburn.   Yes [provider]  amphetamine-dextroamphetamine (ADDERALL XR) 15 MG 24 hr capsule Take 15 mg by mouth daily.  06/19/18  Yes [provider]  atorvastatin (LIPITOR) 40 MG tablet TAKE 1 BY MOUTH DAILY Patient taking differently: Take 40 mg by mouth at bedtime.  06/23/14  Yes Noralee Space, MD  B Complex Vitamins (VITAMIN B COMPLEX) TABS Take 1 tablet by mouth daily.   Yes [provider]  busPIRone (BUSPAR) 10 MG tablet Take 20 mg by mouth daily.    Yes [provider]  escitalopram (LEXAPRO) 20 MG tablet Take 20 mg by mouth daily.   Yes [provider]  guaiFENesin (TUSSIN) 100 MG/5ML liquid Take 200 mg by mouth every 6 (six) hours as needed for cough.   Yes [provider]  magnesium hydroxide (MILK OF MAGNESIA) 400 MG/5ML suspension Take 30 mLs by mouth at bedtime as needed for mild constipation.   Yes [provider]  Melatonin 5 MG TABS Take 5 mg by mouth at bedtime.   Yes [provider]  neomycin-bacitracin-polymyxin (NEOSPORIN) [phone] ointment Apply 1 application topically as needed (to affected  areas, for skin tears or abrasions).    Yes [provider]  oxyCODONE (OXY IR/ROXICODONE) 5 MG immediate release tablet Take 2.5 mg by mouth 2 (two) times daily as needed (for pain).   Yes [provider]  pantoprazole (PROTONIX) 40 MG tablet Take 40 mg by mouth daily.  06/12/18  Yes [provider]  pindolol (VISKEN) 10 MG tablet Take 10 mg by mouth daily.    Yes [provider]  QUEtiapine (  SEROQUEL) 100 MG tablet Take 1 tablet (100 mg total) by mouth at bedtime. 12/01/18  Yes Bonnell Public, MD  QUEtiapine (SEROQUEL) 25 MG tablet Take 1 tablet (25 mg total) by mouth 2 (two) times daily. Patient taking differently: Take 25 mg by mouth 3 (three) times daily as needed (for agitation).  12/01/18  Yes Bonnell Public, MD  vitamin C (ASCORBIC ACID) 500 MG tablet Take 500 mg by mouth daily.   Yes [provider]  aspirin EC 325 MG EC tablet Take 1 tablet (325 mg total) by mouth daily with breakfast. Patient not taking: Reported on 12/24/2018 12/02/18   Dana Allan I, MD  b complex vitamins capsule Take 1 capsule by mouth daily. Patient not taking: Reported on 12/24/2018 08/20/18   Brunetta Genera, MD  bisacodyl (DULCOLAX) 5 MG EC tablet Take 1 tablet (5 mg total) by mouth daily as needed for moderate constipation. Patient not taking: Reported on 12/24/2018 12/01/18   Dana Allan I, MD  docusate sodium (COLACE) 100 MG capsule Take 1 capsule (100 mg total) by mouth 2 (two) times daily. Patient not taking: Reported on 12/24/2018 12/01/18   Dana Allan I, MD  HYDROcodone-acetaminophen (NORCO) 7.5-325 MG tablet Take 1-2 tablets by mouth every 4 (four) hours as needed for severe pain (pain score 7-10). Patient not taking: Reported on 12/24/2018 12/01/18   Bonnell Public, MD   Chest Portable 1 View  Result Date: 12/24/2018 CLINICAL DATA:  Leukocytosis EXAM: PORTABLE CHEST 1 VIEW COMPARISON:  Chest radiograph dated 11/25/2018  FINDINGS: The heart size is normal. Vascular calcifications are seen in the aortic arch. Both lungs are clear. The visualized skeletal structures are unremarkable. IMPRESSION: No active disease. Electronically Signed   By: Zerita Boers M.D.   On: 12/24/2018 20:50   Dg Hip Unilat  With Pelvis 2-3 Views Right  Result Date: 12/24/2018 CLINICAL DATA:  Hip pain right side EXAM: DG HIP (WITH OR WITHOUT PELVIS) 2-3V RIGHT COMPARISON:  12/08/2018, 11/25/2018 FINDINGS: SI joints are non widened. Pubic symphysis intact. Questionable nondisplaced fracture lucency versus soft tissue artifact over the right inferior pubic ramus. Intramedullary rod and distal screw fixation of previous comminuted right intertrochanteric fracture with displaced lesser trochanteric fracture fragment. Acute periprosthetic fracture involving the proximal shaft of the right femur with about 1 shaft diameter of medial displacement of distal fracture fragment. IMPRESSION: 1. Previous intramedullary rod and screw fixation of comminuted intertrochanteric fracture, now with acute displaced periprosthetic fracture involving the proximal shaft of the femur. No femoral head dislocation. Electronically Signed   By: Donavan Foil M.D.   On: 12/24/2018 16:32   - pertinent xrays, CT, MRI studies were reviewed and independently interpreted  Positive ROS: All other systems have been reviewed and were otherwise negative with the exception of those mentioned in the HPI and as above.  Physical Exam: General: Alert, no acute distress Psychiatric: Patient is competent for consent with normal mood and affect Lymphatic: No axillary or cervical lymphadenopathy Cardiovascular: No pedal edema Respiratory: No cyanosis, no use of accessory musculature GI: No organomegaly, abdomen is soft and non-tender    Images:  @ENCIMAGES @  Labs:  Lab Results  Component Value Date   REPTSTATUS 11/15/2018 FINAL 11/13/2018   CULT  11/13/2018    NO GROWTH  Performed at [facility] [address] [place], [place] [address]    LABORGA NO GROWTH 03/14/2015    Lab Results  Component Value Date   ALBUMIN 2.3 (L) 11/30/2018  ALBUMIN 3.5 11/25/2018   ALBUMIN 3.5 11/13/2018    Neurologic: Patient does not have protective sensation bilateral lower extremities.   MUSCULOSKELETAL:   Skin: Examination of skin is intact.  Patient's right lower extremity is shortened and internally rotated she has good capillary refill.  Radiographs shows a femur fracture distal to the femoral nail.  Patient has severe protein caloric malnutrition with an albumin of 2.3.  Assessment: Assessment right femoral shaft fracture distal to the intramedullary nail that was placed 1 month ago for an intertrochanteric hip fracture with periprosthetic femur fracture.  Plan: Plan: We will place patient in Buck's traction plan for removal of the intramedullary nail and placement of a new intramedullary nail that will transverse both fracture sites.  Will obtain consent from patient's sister.  Thank you for the consult and the opportunity to see Desiree Salinas, [place] [phone] 7:20 AM

## 2018-12-25 NOTE — Plan of Care (Signed)
  Problem: Pain Managment: Goal: General experience of comfort will improve Outcome: Progressing   Problem: Safety: Goal: Ability to remain free from injury will improve Outcome: Progressing   Problem: Coping: Goal: Level of anxiety will decrease Outcome: Progressing   

## 2018-12-25 NOTE — Plan of Care (Signed)
  Problem: Clinical Measurements: Goal: Ability to maintain clinical measurements within normal limits will improve Outcome: Progressing Goal: Will remain free from infection Outcome: Progressing Goal: Respiratory complications will improve Outcome: Progressing Goal: Cardiovascular complication will be avoided Outcome: Progressing   Problem: Pain Managment: Goal: General experience of comfort will improve Outcome: Progressing

## 2018-12-26 ENCOUNTER — Encounter (HOSPITAL_COMMUNITY)
Admission: EM | Disposition: A | Discharge status: Skilled Nursing Facility | Payer: Self-pay | Source: Skilled Nursing Facility | Attending: Internal Medicine

## 2018-12-26 ENCOUNTER — Inpatient Hospital Stay (HOSPITAL_COMMUNITY): Payer: Medicare Other

## 2018-12-26 ENCOUNTER — Inpatient Hospital Stay (HOSPITAL_COMMUNITY): Payer: Medicare Other | Admitting: Certified Registered"

## 2018-12-26 DIAGNOSIS — W19XXXA Unspecified fall, initial encounter: Secondary | ICD-10-CM

## 2018-12-26 DIAGNOSIS — M978XXA Periprosthetic fracture around other internal prosthetic joint, initial encounter: Secondary | ICD-10-CM | POA: Diagnosis not present

## 2018-12-26 DIAGNOSIS — Z96649 Presence of unspecified artificial hip joint: Secondary | ICD-10-CM | POA: Diagnosis not present

## 2018-12-26 DIAGNOSIS — E7800 Pure hypercholesterolemia, unspecified: Secondary | ICD-10-CM

## 2018-12-26 HISTORY — PX: HARDWARE REMOVAL: SHX979

## 2018-12-26 HISTORY — PX: INTRAMEDULLARY (IM) NAIL INTERTROCHANTERIC: SHX5875

## 2018-12-26 LAB — URINALYSIS, ROUTINE W REFLEX MICROSCOPIC
Bilirubin Urine: NEGATIVE
Glucose, UA: NEGATIVE mg/dL
Hgb urine dipstick: NEGATIVE
Ketones, ur: NEGATIVE mg/dL
Nitrite: NEGATIVE
Protein, ur: NEGATIVE mg/dL
Specific Gravity, Urine: 1.027 (ref 1.005–1.030)
pH: 5 (ref 5.0–8.0)

## 2018-12-26 LAB — BASIC METABOLIC PANEL
Anion gap: 8 (ref 5–15)
BUN: 15 mg/dL (ref 8–23)
CO2: 24 mmol/L (ref 22–32)
Calcium: 8.4 mg/dL — ABNORMAL LOW (ref 8.9–10.3)
Chloride: 106 mmol/L (ref 98–111)
Creatinine, Ser: 0.69 mg/dL (ref 0.44–1.00)
GFR calc Af Amer: >60 mL/min (ref >60)
GFR calc non Af Amer: >60 mL/min (ref >60)
Glucose, Bld: 123 mg/dL — ABNORMAL HIGH (ref 70–99)
Potassium: 4.1 mmol/L (ref 3.5–5.1)
Sodium: 138 mmol/L (ref 135–145)

## 2018-12-26 LAB — CBC
HCT: 30.4 % — ABNORMAL LOW (ref 36.0–46.0)
Hemoglobin: 9.8 g/dL — ABNORMAL LOW (ref 12.0–15.0)
MCH: 30.1 pg (ref 26.0–34.0)
MCHC: 32.2 g/dL (ref 30.0–36.0)
MCV: 93.3 fL (ref 80.0–100.0)
Platelets: 304 10*3/uL (ref 150–400)
RBC: 3.26 MIL/uL — ABNORMAL LOW (ref 3.87–5.11)
RDW: 13.2 % (ref 11.5–15.5)
WBC: 11.2 10*3/uL — ABNORMAL HIGH (ref 4.0–10.5)
nRBC: 0 % (ref 0.0–0.2)

## 2018-12-26 LAB — TYPE AND SCREEN
ABO/RH(D): A POS
Antibody Screen: NEGATIVE

## 2018-12-26 LAB — ABO/RH: ABO/RH(D): A POS

## 2018-12-26 SURGERY — REMOVAL, HARDWARE
Anesthesia: General | Site: Hip | Laterality: Right

## 2018-12-26 MED ORDER — FENTANYL CITRATE (PF) 100 MCG/2ML IJ SOLN
INTRAMUSCULAR | Status: AC
  Filled 2018-12-26: qty 2

## 2018-12-26 MED ORDER — ONDANSETRON HCL 4 MG/2ML IJ SOLN: 4.0000 mg | Freq: Four times a day (QID) | INTRAMUSCULAR | Status: DC | PRN

## 2018-12-26 MED ORDER — ACETAMINOPHEN 500 MG PO TABS
500.0000 mg | ORAL_TABLET | Freq: Four times a day (QID) | ORAL | Status: AC
  Filled 2018-12-26: qty 1

## 2018-12-26 MED ORDER — FENTANYL CITRATE (PF) 250 MCG/5ML IJ SOLN
INTRAMUSCULAR | Status: AC
  Filled 2018-12-26: qty 5

## 2018-12-26 MED ORDER — LIDOCAINE HCL (CARDIAC) PF 100 MG/5ML IV SOSY: PREFILLED_SYRINGE | INTRAVENOUS | Status: DC | PRN

## 2018-12-26 MED ORDER — MAGNESIUM HYDROXIDE 400 MG/5ML PO SUSP: 30.0000 mL | Freq: Every evening | ORAL | Status: DC | PRN

## 2018-12-26 MED ORDER — ONDANSETRON HCL 4 MG/2ML IJ SOLN: 4.0000 mg | Freq: Once | INTRAMUSCULAR | Status: DC | PRN

## 2018-12-26 MED ORDER — ROCURONIUM BROMIDE 100 MG/10ML IV SOLN
INTRAVENOUS | Status: DC | PRN
  Administered 2018-12-26: 30 mg via INTRAVENOUS
  Administered 2018-12-26: 40 mg via INTRAVENOUS

## 2018-12-26 MED ORDER — ONDANSETRON HCL 4 MG/2ML IJ SOLN
INTRAMUSCULAR | Status: AC
  Filled 2018-12-26: qty 2

## 2018-12-26 MED ORDER — GENTAMICIN SULFATE 40 MG/ML IJ SOLN
INTRAMUSCULAR | Status: AC
  Filled 2018-12-26: qty 6

## 2018-12-26 MED ORDER — MIDAZOLAM HCL 2 MG/2ML IJ SOLN
INTRAMUSCULAR | Status: AC
  Filled 2018-12-26: qty 2

## 2018-12-26 MED ORDER — FENTANYL CITRATE (PF) 250 MCG/5ML IJ SOLN
INTRAMUSCULAR | Status: DC | PRN
  Administered 2018-12-26: 50 ug via INTRAVENOUS

## 2018-12-26 MED ORDER — DOCUSATE SODIUM 100 MG PO CAPS: 100.0000 mg | ORAL_CAPSULE | Freq: Two times a day (BID) | ORAL | Status: DC

## 2018-12-26 MED ORDER — PROPOFOL 10 MG/ML IV BOLUS
INTRAVENOUS | Status: DC | PRN
  Administered 2018-12-26: 80 mg via INTRAVENOUS

## 2018-12-26 MED ORDER — LACTATED RINGERS IV SOLN
INTRAVENOUS | Status: DC
  Administered 2018-12-26: 09:00:00 via INTRAVENOUS

## 2018-12-26 MED ORDER — ACETAMINOPHEN 10 MG/ML IV SOLN
INTRAVENOUS | Status: DC | PRN
  Administered 2018-12-26: 1000 mg via INTRAVENOUS

## 2018-12-26 MED ORDER — METOCLOPRAMIDE HCL 5 MG/ML IJ SOLN: 5.0000 mg | Freq: Three times a day (TID) | INTRAMUSCULAR | Status: DC | PRN

## 2018-12-26 MED ORDER — ROCURONIUM BROMIDE 10 MG/ML (PF) SYRINGE
PREFILLED_SYRINGE | INTRAVENOUS | Status: AC
  Filled 2018-12-26: qty 10

## 2018-12-26 MED ORDER — 0.9 % SODIUM CHLORIDE (POUR BTL) OPTIME
TOPICAL | Status: DC | PRN
  Administered 2018-12-26: 1000 mL

## 2018-12-26 MED ORDER — ACETAMINOPHEN 10 MG/ML IV SOLN
INTRAVENOUS | Status: AC
  Filled 2018-12-26: qty 100

## 2018-12-26 MED ORDER — PHENYLEPHRINE 40 MCG/ML (10ML) SYRINGE FOR IV PUSH (FOR BLOOD PRESSURE SUPPORT)
PREFILLED_SYRINGE | INTRAVENOUS | Status: DC | PRN
  Administered 2018-12-26 (×3): 80 ug via INTRAVENOUS

## 2018-12-26 MED ORDER — SUGAMMADEX SODIUM 200 MG/2ML IV SOLN
INTRAVENOUS | Status: DC | PRN
  Administered 2018-12-26: 150 mg via INTRAVENOUS

## 2018-12-26 MED ORDER — ASPIRIN EC 325 MG PO TBEC
325.0000 mg | DELAYED_RELEASE_TABLET | Freq: Every day | ORAL | Status: DC
  Administered 2018-12-27 – 2019-01-07 (×12): 325 mg via ORAL
  Filled 2018-12-26 (×12): qty 1

## 2018-12-26 MED ORDER — ALBUMIN HUMAN 5 % IV SOLN
INTRAVENOUS | Status: DC | PRN
  Administered 2018-12-26: 11:00:00 via INTRAVENOUS

## 2018-12-26 MED ORDER — ACETAMINOPHEN 325 MG PO TABS
325.0000 mg | ORAL_TABLET | Freq: Four times a day (QID) | ORAL | Status: DC | PRN
  Administered 2018-12-27 – 2018-12-28 (×2): 650 mg via ORAL
  Administered 2018-12-31: 325 mg via ORAL
  Administered 2019-01-03 – 2019-01-04 (×2): 650 mg via ORAL
  Filled 2018-12-26 (×3): qty 2
  Filled 2018-12-26: qty 1
  Filled 2018-12-26: qty 2

## 2018-12-26 MED ORDER — HYDROCODONE-ACETAMINOPHEN 5-325 MG PO TABS
1.0000 | ORAL_TABLET | ORAL | Status: DC | PRN
  Administered 2018-12-27 – 2018-12-31 (×3): 2 via ORAL
  Administered 2019-01-02: 1 via ORAL
  Filled 2018-12-26: qty 1
  Filled 2018-12-26 (×3): qty 2

## 2018-12-26 MED ORDER — ACETAMINOPHEN 500 MG PO TABS
1000.0000 mg | ORAL_TABLET | Freq: Once | ORAL | Status: AC
  Administered 2018-12-26: 09:00:00 1000 mg via ORAL

## 2018-12-26 MED ORDER — METOCLOPRAMIDE HCL 5 MG PO TABS: 5.0000 mg | ORAL_TABLET | Freq: Three times a day (TID) | ORAL | Status: DC | PRN

## 2018-12-26 MED ORDER — HYDROCODONE-ACETAMINOPHEN 7.5-325 MG PO TABS: 1.0000 | ORAL_TABLET | ORAL | Status: DC | PRN

## 2018-12-26 MED ORDER — SODIUM CHLORIDE 0.9 % IV SOLN
INTRAVENOUS | Status: DC | PRN
  Administered 2018-12-26: 50 ug/min via INTRAVENOUS

## 2018-12-26 MED ORDER — ONDANSETRON HCL 4 MG PO TABS: 4.0000 mg | ORAL_TABLET | Freq: Four times a day (QID) | ORAL | Status: DC | PRN

## 2018-12-26 MED ORDER — FENTANYL CITRATE (PF) 100 MCG/2ML IJ SOLN
25.0000 ug | INTRAMUSCULAR | Status: DC | PRN
  Administered 2018-12-26 (×2): 25 ug via INTRAVENOUS

## 2018-12-26 MED ORDER — DEXAMETHASONE SODIUM PHOSPHATE 10 MG/ML IJ SOLN
INTRAMUSCULAR | Status: DC | PRN
  Administered 2018-12-26: 10 mg via INTRAVENOUS

## 2018-12-26 MED ORDER — PROPOFOL 10 MG/ML IV BOLUS
INTRAVENOUS | Status: AC
  Filled 2018-12-26: qty 20

## 2018-12-26 MED ORDER — LIDOCAINE HCL 1 % IJ SOLN
INTRAMUSCULAR | Status: DC | PRN
  Administered 2018-12-26: 50 mg via INTRADERMAL

## 2018-12-26 MED ORDER — LACTATED RINGERS IV SOLN
INTRAVENOUS | Status: DC | PRN
  Administered 2018-12-26: 10:00:00 via INTRAVENOUS

## 2018-12-26 MED ORDER — MORPHINE SULFATE (PF) 2 MG/ML IV SOLN
0.5000 mg | INTRAVENOUS | Status: DC | PRN
  Administered 2018-12-26 (×2): 1 mg via INTRAVENOUS
  Filled 2018-12-26 (×2): qty 1

## 2018-12-26 MED ORDER — SUCCINYLCHOLINE CHLORIDE 200 MG/10ML IV SOSY
PREFILLED_SYRINGE | INTRAVENOUS | Status: AC
  Filled 2018-12-26: qty 10

## 2018-12-26 MED ORDER — VANCOMYCIN HCL 1000 MG IV SOLR
INTRAVENOUS | Status: AC
  Filled 2018-12-26: qty 1000

## 2018-12-26 MED ORDER — ACETAMINOPHEN 500 MG PO TABS
ORAL_TABLET | ORAL | Status: AC
  Administered 2018-12-26: 1000 mg via ORAL
  Filled 2018-12-26: qty 2

## 2018-12-26 SURGICAL SUPPLY — 60 items
BANDAGE ESMARK 6X9 LF (GAUZE/BANDAGES/DRESSINGS) IMPLANT
BLADE HELICAL TFNA 90 HIP (Anchor) ×1 IMPLANT
BLADE HELICAL TFNA 90MM HIP (Anchor) ×1 IMPLANT
BLADE SURG 15 STRL LF DISP TIS (BLADE) ×1 IMPLANT
BLADE SURG 15 STRL SS (BLADE) ×3
BNDG CMPR 9X6 STRL LF SNTH (GAUZE/BANDAGES/DRESSINGS)
BNDG COHESIVE 4X5 TAN STRL (GAUZE/BANDAGES/DRESSINGS) IMPLANT
BNDG ESMARK 6X9 LF (GAUZE/BANDAGES/DRESSINGS)
BNDG GAUZE ELAST 4 BULKY (GAUZE/BANDAGES/DRESSINGS) ×3 IMPLANT
COVER BACK TABLE 60X90IN (DRAPES) ×3 IMPLANT
COVER PERINEAL POST (MISCELLANEOUS) ×3 IMPLANT
COVER SURGICAL LIGHT HANDLE (MISCELLANEOUS) ×6 IMPLANT
COVER WAND RF STERILE (DRAPES) ×3 IMPLANT
CUFF TOURN SGL QUICK 34 (TOURNIQUET CUFF)
CUFF TOURN SGL QUICK 42 (TOURNIQUET CUFF) IMPLANT
CUFF TRNQT CYL 34X4.125X (TOURNIQUET CUFF) IMPLANT
DRAPE C-ARM 42X72 X-RAY (DRAPES) ×3 IMPLANT
DRAPE INCISE IOBAN 66X45 STRL (DRAPES) IMPLANT
DRAPE ORTHO SPLIT 77X108 STRL (DRAPES)
DRAPE STERI IOBAN 125X83 (DRAPES) ×3 IMPLANT
DRAPE SURG ORHT 6 SPLT 77X108 (DRAPES) IMPLANT
DRAPE U-SHAPE 47X51 STRL (DRAPES) ×3 IMPLANT
DRSG ADAPTIC 3X8 NADH LF (GAUZE/BANDAGES/DRESSINGS) ×3 IMPLANT
DRSG EMULSION OIL 3X3 NADH (GAUZE/BANDAGES/DRESSINGS) ×3 IMPLANT
DRSG MEPILEX BORDER 4X4 (GAUZE/BANDAGES/DRESSINGS) ×1 IMPLANT
DRSG MEPILEX BORDER 4X8 (GAUZE/BANDAGES/DRESSINGS) ×5 IMPLANT
DRSG PAD ABDOMINAL 8X10 ST (GAUZE/BANDAGES/DRESSINGS) ×3 IMPLANT
DURAPREP 26ML APPLICATOR (WOUND CARE) ×3 IMPLANT
ELECT REM PT RETURN 9FT ADLT (ELECTROSURGICAL) ×3
ELECTRODE REM PT RTRN 9FT ADLT (ELECTROSURGICAL) ×1 IMPLANT
EVACUATOR 1/8 PVC DRAIN (DRAIN) IMPLANT
GAUZE SPONGE 4X4 12PLY STRL (GAUZE/BANDAGES/DRESSINGS) ×3 IMPLANT
GLOVE BIOGEL PI IND STRL 9 (GLOVE) ×1 IMPLANT
GLOVE BIOGEL PI INDICATOR 9 (GLOVE) ×2
GLOVE SURG ORTHO 9.0 STRL STRW (GLOVE) ×1 IMPLANT
GOWN STRL REUS W/ TWL XL LVL3 (GOWN DISPOSABLE) ×3 IMPLANT
GOWN STRL REUS W/TWL XL LVL3 (GOWN DISPOSABLE) ×9
GUIDEWIRE 3.2X400 (WIRE) ×2 IMPLANT
KIT BASIN OR (CUSTOM PROCEDURE TRAY) ×3 IMPLANT
KIT TURNOVER KIT B (KITS) ×3 IMPLANT
MANIFOLD NEPTUNE II (INSTRUMENTS) ×3 IMPLANT
NAIL TI CAN TFNA 12 130 360 RT (Nail) ×2 IMPLANT
NS IRRIG 1000ML POUR BTL (IV SOLUTION) ×3 IMPLANT
PACK GENERAL/GYN (CUSTOM PROCEDURE TRAY) ×3 IMPLANT
PACK ORTHO EXTREMITY (CUSTOM PROCEDURE TRAY) ×3 IMPLANT
PAD ARMBOARD 7.5X6 YLW CONV (MISCELLANEOUS) ×6 IMPLANT
REAMER ROD DEEP FLUTE 2.5X950 (INSTRUMENTS) ×4 IMPLANT
SPONGE LAP 18X18 RF (DISPOSABLE) IMPLANT
STAPLER VISISTAT 35W (STAPLE) IMPLANT
STOCKINETTE IMPERVIOUS 9X36 MD (GAUZE/BANDAGES/DRESSINGS) IMPLANT
SUT ETHILON 2 0 PSLX (SUTURE) ×2 IMPLANT
SUT VIC AB 0 CT1 27 (SUTURE)
SUT VIC AB 0 CT1 27XBRD ANBCTR (SUTURE) IMPLANT
SUT VIC AB 2-0 CT1 27 (SUTURE)
SUT VIC AB 2-0 CT1 TAPERPNT 27 (SUTURE) IMPLANT
SUT VIC AB 2-0 CTB1 (SUTURE) IMPLANT
TOWEL GREEN STERILE (TOWEL DISPOSABLE) ×3 IMPLANT
TOWEL GREEN STERILE FF (TOWEL DISPOSABLE) ×3 IMPLANT
UNDERPAD 30X30 (UNDERPADS AND DIAPERS) ×3 IMPLANT
WATER STERILE IRR 1000ML POUR (IV SOLUTION) ×6 IMPLANT

## 2018-12-26 NOTE — Interval H&P Note (Signed)
History and Physical Interval Note:  12/26/2018 7:02 AM  Desiree Salinas  has presented today for surgery, with the diagnosis of Periprosthetic Femur Fracture Right.  The various methods of treatment have been discussed with the patient and family. After consideration of risks, benefits and other options for treatment, the patient has consented to  Procedure(s): REMOVE NAIL RIGHT FEMUR (Right) INTRAMEDULLARY LONG INTERTROCHANTRIC NAIL (Right) as a surgical intervention.  The patient's history has been reviewed, patient examined, no change in status, stable for surgery.  I have reviewed the patient's chart and labs.  Questions were answered to the patient's satisfaction.     [name] [facility] [phone]

## 2018-12-26 NOTE — Progress Notes (Signed)
Site continues to bleed.  Dr. Sharol Given notified.  Coming to see pt in PACU.

## 2018-12-26 NOTE — Transfer of Care (Signed)
Immediate Anesthesia Transfer of Care Note  Patient: Desiree Salinas  Procedure(s) Performed: REMOVE NAIL RIGHT FEMUR (Right Hip) INTRAMEDULLARY LONG INTERTROCHANTRIC NAIL (Right Hip)  Patient Location: PACU  Anesthesia Type:General  Level of Consciousness: drowsy  Airway & Oxygen Therapy: Patient Spontanous Breathing and Patient connected to nasal cannula oxygen  Post-op Assessment: Report given to RN and Post -op Vital signs reviewed and stable  Post vital signs: Reviewed and stable  Last Vitals:  Vitals Value Taken Time  BP    Temp    Pulse 73 12/26/18 1102  Resp 0 12/26/18 1101  SpO2 100 % 12/26/18 1102  Vitals shown include unvalidated device data.  Last Pain:  Vitals:   12/26/18 0333  TempSrc: Oral  PainSc:          Complications: No apparent anesthesia complications

## 2018-12-26 NOTE — Anesthesia Postprocedure Evaluation (Signed)
Anesthesia Post Note  Patient: Desiree Salinas  Procedure(s) Performed: REMOVE NAIL RIGHT FEMUR (Right Hip) INTRAMEDULLARY LONG INTERTROCHANTRIC NAIL (Right Hip)     Patient location during evaluation: PACU Anesthesia Type: General Level of consciousness: awake and alert Pain management: pain level controlled Vital Signs Assessment: post-procedure vital signs reviewed and stable Respiratory status: spontaneous breathing, nonlabored ventilation, respiratory function stable and patient connected to nasal cannula oxygen Cardiovascular status: blood pressure returned to baseline and stable Postop Assessment: no apparent nausea or vomiting Anesthetic complications: no    Last Vitals:  Vitals:   12/26/18 1307 12/26/18 1322  BP: 108/65 104/69  Pulse: 79 78  Resp: 15 17  Temp:  (!) 36 C  SpO2: 99% 100%    Last Pain:  Vitals:   12/26/18 0333  TempSrc: Oral  PainSc:                  Catalina Gravel

## 2018-12-26 NOTE — NC FL2 (Signed)
[place] LEVEL OF CARE SCREENING TOOL     IDENTIFICATION  Patient Name: Desiree Salinas Birthdate: 12/19/1950 Sex: female Admission Date (Current Location): 12/24/2018  [facility] and [place] Number:  Herbalist and Address:  [facility], [place] [address], [place], [place] [address]      Provider Number: [id number]  Attending Physician Name and Address:  Donne Hazel, MD  Relative Name and Phone Number:       Current Level of Care: Hospital Recommended Level of Care: [place] Prior Approval Number:    Date Approved/Denied:   PASRR Number: pending  Discharge Plan: SNF    Current Diagnoses: Patient Active Problem List   Diagnosis Date Noted  . Periprosthetic fracture of hip 12/24/2018  . Hip fracture ([place]) 11/26/2018  . Fall at home, initial encounter 11/26/2018  . Leukocytosis 11/26/2018  . COPD (chronic obstructive pulmonary disease) ([place]) 11/26/2018  . Intertrochanteric fracture of right hip ([place]) 11/26/2018  . Closed intertrochanteric fracture of hip, right, initial encounter ([place])   . Dementia with aggressive behavior ([place]) 11/08/2018  . Iron deficiency anemia 07/15/2018  . Fall 06/09/2018  . Alcoholic dementia ([place]) [id number]  . Degenerative scoliosis 02/10/2018  . Diarrhea 10/14/2013  . Cigarette smoker 08/08/2011  . VAIN I (vaginal intraepithelial neoplasia grade I)   . COLONIC POLYPS 12/21/2007  . ALLERGIC RHINITIS 12/21/2007  . 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 HYPERCHOLESTEROLEMIA 07/05/2007  . 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 DERMATITIS 07/05/2007  . SHINGLES, HX OF 07/05/2007    Orientation RESPIRATION BLADDER Height & Weight     Self  Normal Incontinent, External catheter Weight: 120 lb (54.4 kg) Height:  5\' 3"  (160 cm)  BEHAVIORAL SYMPTOMS/MOOD NEUROLOGICAL BOWEL NUTRITION STATUS      Continent Diet(see discharge summary)  AMBULATORY STATUS COMMUNICATION OF  NEEDS Skin   Extensive Assist Verbally Surgical wounds(incision on right hip with adhesive bandage)                       Personal Care Assistance Level of Assistance  Dressing, Bathing, Feeding Bathing Assistance: Maximum assistance Feeding assistance: Limited assistance Dressing Assistance: Maximum assistance     Functional Limitations Info  Sight, Speech, Hearing Sight Info: Adequate Hearing Info: Adequate Speech Info: Adequate    SPECIAL CARE FACTORS FREQUENCY  OT (By licensed OT), PT (By licensed PT)     PT Frequency: 5x week OT Frequency: 5x week            Contractures Contractures Info: Not present    Additional Factors Info  Code Status, Allergies, Psychotropic Code Status Info: Full Code Allergies Info: Latex, Nickel, Other, Cobalt, Eucerin (Basis Facial Moisturizer) Psychotropic Info: busPIRone (BUSPAR) tablet 20 mg daily PO; escitalopram (LEXAPRO) tablet 20 mg daily PO; QUEtiapine (SEROQUEL) tablet 100 mg daily at bedtime PO         Current Medications (12/26/2018):  This is the current hospital active medication list Current Facility-Administered Medications  Medication Dose Route Frequency Provider Last Rate Last Dose  . [START ON 12/27/2018] acetaminophen (TYLENOL) tablet 325-650 mg  325-650 mg Oral Q6H PRN Rayburn, Neta Mends, PA-C      . acetaminophen (TYLENOL) tablet 500 mg  500 mg Oral Q6H Rayburn, Shawn Montgomery, PA-C      . [START ON 12/27/2018] aspirin EC tablet 325 mg  325 mg Oral Q breakfast Rayburn, Neta Mends, PA-C      . atorvastatin (LIPITOR) tablet  40 mg  40 mg Oral QHS Rayburn, Neta Mends, PA-C   40 mg at 12/25/18 2050  . busPIRone (BUSPAR) tablet 20 mg  20 mg Oral Daily Rayburn, Neta Mends, PA-C   20 mg at 12/25/18 1745  . docusate sodium (COLACE) capsule 100 mg  100 mg Oral BID Rayburn, Neta Mends, PA-C   100 mg at 12/25/18 2050  . escitalopram (LEXAPRO) tablet 20 mg  20 mg Oral Daily Rayburn, Neta Mends, PA-C   20 mg at 12/25/18 1745  . feeding supplement (ENSURE ENLIVE) (ENSURE ENLIVE) liquid 237 mL  237 mL Oral Q1500 Rayburn, Neta Mends, PA-C   237 mL at 12/26/18 1508  . fentaNYL (SUBLIMAZE) 100 MCG/2ML injection           . guaiFENesin (ROBITUSSIN) 100 MG/5ML solution 200 mg  200 mg Oral Q6H PRN Rayburn, Neta Mends, PA-C      . HYDROcodone-acetaminophen (NORCO) 7.5-325 MG per tablet 1-2 tablet  1-2 tablet Oral Q4H PRN Rayburn, Neta Mends, PA-C      . HYDROcodone-acetaminophen (NORCO/VICODIN) 5-325 MG per tablet 1-2 tablet  1-2 tablet Oral Q4H PRN Rayburn, Neta Mends, PA-C      . ipratropium-albuterol (DUONEB) 0.5-2.5 (3) MG/3ML nebulizer solution 3 mL  3 mL Nebulization Q6H PRN Rayburn, Neta Mends, PA-C      . lactated ringers infusion   Intravenous Continuous Rayburn, Neta Mends, PA-C   Stopped at 12/26/18 1102  . magnesium hydroxide (MILK OF MAGNESIA) suspension 30 mL  30 mL Oral QHS PRN Rayburn, Neta Mends, PA-C      . Melatonin TABS 3 mg  3 mg Oral QHS Rayburn, Neta Mends, PA-C   3 mg at 12/25/18 2050  . methocarbamol (ROBAXIN) tablet 500 mg  500 mg Oral Q6H PRN Rayburn, Neta Mends, PA-C       Or  . methocarbamol (ROBAXIN) 500 mg in dextrose 5 % 50 mL IVPB  500 mg Intravenous Q6H PRN Rayburn, Neta Mends, PA-C      . metoCLOPramide (REGLAN) tablet 5-10 mg  5-10 mg Oral Q8H PRN Rayburn, Neta Mends, PA-C       Or  . metoCLOPramide (REGLAN) injection 5-10 mg  5-10 mg Intravenous Q8H PRN Rayburn, Neta Mends, PA-C      . morphine 2 MG/ML injection 0.5-1 mg  0.5-1 mg Intravenous Q2H PRN Rayburn, Neta Mends, PA-C   1 mg at 12/26/18 1507  . ondansetron (ZOFRAN) tablet 4 mg  4 mg Oral Q6H PRN Rayburn, Neta Mends, PA-C       Or  . ondansetron (ZOFRAN) injection 4 mg  4 mg Intravenous Q6H PRN Rayburn, Neta Mends, PA-C      . pantoprazole (PROTONIX) EC tablet 40 mg  40 mg Oral Daily Rayburn, Neta Mends, PA-C   40 mg at 12/25/18 1745  . pindolol (VISKEN) tablet 10 mg  10 mg Oral Daily Rayburn, Neta Mends, PA-C   10 mg at 12/26/18 0756  . QUEtiapine (SEROQUEL) tablet 100 mg  100 mg Oral QHS Rayburn, Neta Mends, PA-C   100 mg at 12/25/18 0010  . QUEtiapine (SEROQUEL) tablet 25 mg  25 mg Oral TID PRN Rayburn, Neta Mends, PA-C   25 mg at 12/25/18 2051     Discharge Medications: Please see discharge summary for a list of discharge medications.  Relevant Imaging Results:  Relevant Lab Results:   Additional Information SS# [place], [place]

## 2018-12-26 NOTE — Anesthesia Preprocedure Evaluation (Addendum)
Anesthesia Evaluation  Patient identified by MRN, date of birth, ID band Patient confused    Reviewed: Allergy & Precautions, NPO status , Patient's Chart, lab work & pertinent test results, Unable to perform ROS - Chart review only  Airway Mallampati: II  TM Distance: >3 FB Neck ROM: Full    Dental  (+) Dental Advisory Given, Chipped,    Pulmonary COPD, former smoker,    Pulmonary exam normal breath sounds clear to auscultation       Cardiovascular negative cardio ROS Normal cardiovascular exam Rhythm:Regular Rate:Normal     Neuro/Psych PSYCHIATRIC DISORDERS Anxiety Depression Dementia Dementia     GI/Hepatic GERD  Medicated,(+)     substance abuse  alcohol use,   Endo/Other  negative endocrine ROS  Renal/GU negative Renal ROS     Musculoskeletal  (+) Arthritis , Periprosthetic Femur Fracture Right   Abdominal   Peds  Hematology  (+) Blood dyscrasia, anemia ,   Anesthesia Other Findings Day of surgery medications reviewed with the patient.  Reproductive/Obstetrics                           Anesthesia Physical Anesthesia Plan  ASA: III  Anesthesia Plan: General   Post-op Pain Management:    Induction: Intravenous  PONV Risk Score and Plan: 3 and Midazolam, Dexamethasone and Ondansetron  Airway Management Planned: Oral ETT  Additional Equipment:   Intra-op Plan:   Post-operative Plan: Extubation in OR  Informed Consent: I have reviewed the patients History and Physical, chart, labs and discussed the procedure including the risks, benefits and alternatives for the proposed anesthesia with the patient or authorized representative who has indicated his/her understanding and acceptance.     Dental advisory given  Plan Discussed with: CRNA  Anesthesia Plan Comments:        Anesthesia Quick Evaluation

## 2018-12-26 NOTE — Progress Notes (Addendum)
PROGRESS NOTE    Desiree Salinas  [id number] DOB: Jul 04, 1950 DOA: 12/24/2018 PCP: [name], MD    Brief Narrative:  68 y.o. female with medical history significant of dementia, anemia, anxiety, arthritis, COPD, depression, hyperlipidemia, hospital admission 9/22-9/28 for intertrochanteric fracture of right hip status post intramedullary nailing by Dr. Sharol Given on 9/23 presenting from her nursing home with complaints of right hip pain.  Patient is somnolent after receiving fentanyl for pain.  No history could be obtained from her.  Per caregiver at bedside, this morning patient slipped out of her wheelchair while staff at the nursing home were helping her take a bath.  She did not hit her head or lose consciousness.  Patient has been complaining of pain in her right hip.  ED Course: Hemodynamically stable.  White blood cell count 14.9.  Hemoglobin 11.4, stable compared to prior labs.  UA pending.  SARS-CoV-2 test pending.  X-ray of right hip and pelvis showing previous intramedullary rod and screw fixation of comminuted intertrochanteric fracture, now with acute displaced periprosthetic fracture involving the proximal shaft of the femur.  No femoral head dislocation.  Assessment & Plan:   Principal Problem:   Periprosthetic fracture of hip Active Problems:   HYPERCHOLESTEROLEMIA   Dementia with aggressive behavior (HCC)   Leukocytosis   COPD (chronic obstructive pulmonary disease) (HCC)  R hip periprosthetic fracture - X-ray of right hip and pelvis showing previous intramedullary rod and screw fixation of comminuted intertrochanteric fracture, now with acute displaced periprosthetic fracture involving the proximal shaft of the femur.   -No femoral head dislocation.  -Orthopedic Surgery following. Pt now s/p surgery 10/23 -Continue with analgesics as needed  Leukocytosis -White blood cell count 14.9, improved to 11.2k this AM. Likely secondary to  following acute fracture -Chest  x-ray reviewed, clear -UA remains pending this AM -Recheck CBC in AM  COPD -Remains stable at this time -On minimal O2 support. No wheezing appreciated -Continue Combivent as needed.  Dementia with Aggressive behavior -Seems stable at this time -Have resumed home meds as tolerated -Recent EKG reviewed, QTc noted to be 452  HLD -Will resume home statin as tolerated  DVT prophylaxis: Heparin subQ Code Status: Full Family Communication: Pt in room, family over phone Disposition Plan: Uncertain at this time  Consultants:   Orthopedic Surgery  Procedures:     Antimicrobials: Anti-infectives (From admission, onward)   Start     Dose/Rate Route Frequency Ordered Stop   12/26/18 0600  ceFAZolin (ANCEF) IVPB 2g/100 mL premix     2 g 200 mL/hr over 30 Minutes Intravenous On call to O.R. 12/25/18 2136 12/26/18 [id number]      Subjective: Seen in PACU, pt still sedated post-operatively. Unable to assess  Objective: Vitals:   12/26/18 1307 12/26/18 1322 12/26/18 1337 12/26/18 1401  BP: 108/65 104/69 118/82 108/63  Pulse: 79 78 83 80  Resp: 15 17 15 14   Temp:  (!) 96.8 F (36 C)  99.1 F (37.3 C)  TempSrc:    Oral  SpO2: 99% 100% 98% 99%  Weight:      Height:        Intake/Output Summary (Last 24 hours) at 12/26/2018 1518 Last data filed at 12/26/2018 1500 Gross per 24 hour  Intake 1190 ml  Output 210 ml  Net 980 ml   Filed Weights   12/24/18 1344  Weight: 54.4 kg    Examination: General exam: Asleep, laying in bed, in nad Respiratory system: Normal respiratory effort, no wheezing Cardiovascular  system: regular rate, s1, s2 Gastrointestinal system: Soft, nondistended, positive BS Central nervous system: CN2-12 grossly intact, strength intact Extremities: Perfused, no clubbing Skin: Normal skin turgor, no notable skin lesions seen Psychiatry: unable to assess given current mentation  Data Reviewed: I have personally reviewed following labs and imaging  studies  CBC: Recent Labs  Lab 12/24/18 1837 12/25/18 0841 12/26/18 0327  WBC 14.9* 11.4* 11.2*  NEUTROABS 12.2*  --   --   HGB 11.4* 10.3* 9.8*  HCT 36.1 32.8* 30.4*  MCV 95.5 94.5 93.3  PLT 351 365 [id number]   Basic Metabolic Panel: Recent Labs  Lab 12/24/18 1837 12/26/18 0327  NA 137 138  K 4.3 4.1  CL 103 106  CO2 21* 24  GLUCOSE 115* 123*  BUN 13 15  CREATININE 0.87 0.69  CALCIUM 8.7* 8.4*   GFR: Estimated Creatinine Clearance: 56.4 mL/min (by C-G formula based on SCr of 0.69 mg/dL). Liver Function Tests: No results for input(s): AST, ALT, ALKPHOS, BILITOT, PROT, ALBUMIN in the last 168 hours. No results for input(s): LIPASE, AMYLASE in the last 168 hours. No results for input(s): AMMONIA in the last 168 hours. Coagulation Profile: No results for input(s): INR, PROTIME in the last 168 hours. Cardiac Enzymes: No results for input(s): CKTOTAL, CKMB, CKMBINDEX, TROPONINI in the last 168 hours. BNP (last 3 results) No results for input(s): PROBNP in the last 8760 hours. HbA1C: No results for input(s): HGBA1C in the last 72 hours. CBG: No results for input(s): GLUCAP in the last 168 hours. Lipid Profile: No results for input(s): CHOL, HDL, LDLCALC, TRIG, CHOLHDL, LDLDIRECT in the last 72 hours. Thyroid Function Tests: No results for input(s): TSH, T4TOTAL, FREET4, T3FREE, THYROIDAB in the last 72 hours. Anemia Panel: No results for input(s): VITAMINB12, FOLATE, FERRITIN, TIBC, IRON, RETICCTPCT in the last 72 hours. Sepsis Labs: No results for input(s): PROCALCITON, LATICACIDVEN in the last 168 hours.  Recent Results (from the past 240 hour(s))  SARS Coronavirus 2 by RT PCR (hospital order, performed in [facility] hospital lab) Nasopharyngeal Nasopharyngeal Swab     Status: None   Collection Time: 12/24/18  8:44 PM   Specimen: Nasopharyngeal Swab  Result Value Ref Range Status   SARS Coronavirus 2 NEGATIVE NEGATIVE Final    Comment: (NOTE) If result is NEGATIVE  SARS-CoV-2 target nucleic acids are NOT DETECTED. The SARS-CoV-2 RNA is generally detectable in upper and lower  respiratory specimens during the acute phase of infection. The lowest  concentration of SARS-CoV-2 viral copies this assay can detect is 250  copies / mL. A negative result does not preclude SARS-CoV-2 infection  and should not be used as the sole basis for treatment or other  patient management decisions.  A negative result may occur with  improper specimen collection / handling, submission of specimen other  than nasopharyngeal swab, presence of viral mutation(s) within the  areas targeted by this assay, and inadequate number of viral copies  (<250 copies / mL). A negative result must be combined with clinical  observations, patient history, and epidemiological information. If result is POSITIVE SARS-CoV-2 target nucleic acids are DETECTED. The SARS-CoV-2 RNA is generally detectable in upper and lower  respiratory specimens dur ing the acute phase of infection.  Positive  results are indicative of active infection with SARS-CoV-2.  Clinical  correlation with patient history and other diagnostic information is  necessary to determine patient infection status.  Positive results do  not rule out bacterial infection or co-infection with other viruses.  If result is PRESUMPTIVE POSTIVE SARS-CoV-2 nucleic acids MAY BE PRESENT.   A presumptive positive result was obtained on the submitted specimen  and confirmed on repeat testing.  While 2019 novel coronavirus  (SARS-CoV-2) nucleic acids may be present in the submitted sample  additional confirmatory testing may be necessary for epidemiological  and / or clinical management purposes  to differentiate between  SARS-CoV-2 and other Sarbecovirus currently known to infect humans.  If clinically indicated additional testing with an alternate test  methodology [phone]) is advised. The SARS-CoV-2 RNA is generally  detectable in upper  and lower respiratory sp ecimens during the acute  phase of infection. The expected result is Negative. Fact Sheet for Patients:  StrictlyIdeas.no Fact Sheet for Healthcare Providers: BankingDealers.co.za This test is not yet approved or cleared by the [place] FDA and has been authorized for detection and/or diagnosis of SARS-CoV-2 by FDA under an Emergency Use Authorization (EUA).  This EUA will remain in effect (meaning this test can be used) for the duration of the COVID-19 declaration under Section 564(b)(1) of the Act, 21 U.S.C. section 360bbb-3(b)(1), unless the authorization is terminated or revoked sooner. Performed at [facility], [place] [address] [place], [place] [address]   Surgical pcr screen     Status: None   Collection Time: 12/25/18  3:44 AM   Specimen: Nasal Swab  Result Value Ref Range Status   MRSA, PCR NEGATIVE NEGATIVE Final   Staphylococcus aureus NEGATIVE NEGATIVE Final    Comment: (NOTE) The Xpert SA Assay (FDA approved for NASAL specimens in patients 38 years of age and older), is one component of a comprehensive surveillance program. It is not intended to diagnose infection nor to guide or monitor treatment. Performed at [facility], [place] [address], [place], [place] [address]      Radiology Studies: Chest Portable 1 View  Result Date: 12/24/2018 CLINICAL DATA:  Leukocytosis EXAM: PORTABLE CHEST 1 VIEW COMPARISON:  Chest radiograph dated 11/25/2018 FINDINGS: The heart size is normal. Vascular calcifications are seen in the aortic arch. Both lungs are clear. The visualized skeletal structures are unremarkable. IMPRESSION: No active disease. Electronically Signed   By: Zerita Boers M.D.   On: 12/24/2018 20:50   Dg C-arm 1-60 Min-no Report  Result Date: 12/26/2018 Fluoroscopy was utilized by the requesting physician.  No radiographic interpretation.   Dg Hip Unilat  With Pelvis 2-3  Views Right  Result Date: 12/24/2018 CLINICAL DATA:  Hip pain right side EXAM: DG HIP (WITH OR WITHOUT PELVIS) 2-3V RIGHT COMPARISON:  12/08/2018, 11/25/2018 FINDINGS: SI joints are non widened. Pubic symphysis intact. Questionable nondisplaced fracture lucency versus soft tissue artifact over the right inferior pubic ramus. Intramedullary rod and distal screw fixation of previous comminuted right intertrochanteric fracture with displaced lesser trochanteric fracture fragment. Acute periprosthetic fracture involving the proximal shaft of the right femur with about 1 shaft diameter of medial displacement of distal fracture fragment. IMPRESSION: 1. Previous intramedullary rod and screw fixation of comminuted intertrochanteric fracture, now with acute displaced periprosthetic fracture involving the proximal shaft of the femur. No femoral head dislocation. Electronically Signed   By: Donavan Foil M.D.   On: 12/24/2018 16:32    Scheduled Meds: . acetaminophen  500 mg Oral Q6H  . [START ON 12/27/2018] aspirin  325 mg Oral Q breakfast  . atorvastatin  40 mg Oral QHS  . busPIRone  20 mg Oral Daily  . docusate sodium  100 mg Oral BID  . escitalopram  20 mg Oral Daily  . feeding supplement (ENSURE ENLIVE)  237 mL Oral Q1500  . fentaNYL      . Melatonin  3 mg Oral QHS  . pantoprazole  40 mg Oral Daily  . pindolol  10 mg Oral Daily  . QUEtiapine  100 mg Oral QHS   Continuous Infusions: . lactated ringers Stopped (12/26/18 1102)  . methocarbamol (ROBAXIN) IV       LOS: 2 days   Marylu Lund, MD [facility] Pager On Amion  If 7PM-7AM, please contact night-coverage 12/26/2018, 3:18 PM

## 2018-12-26 NOTE — Op Note (Signed)
12/26/2018  11:00 AM  PATIENT:  Desiree Salinas    PRE-OPERATIVE DIAGNOSIS:  Periprosthetic Femur Fracture Right  POST-OPERATIVE DIAGNOSIS:  Same  PROCEDURE:  REMOVE NAIL RIGHT FEMUR, INTRAMEDULLARY LONG INTERTROCHANTRIC NAIL  SURGEON:  Newt Minion, MD  PHYSICIAN ASSISTANT:None ANESTHESIA:   General  PREOPERATIVE INDICATIONS:  Desiree Salinas is a  68 y.o. female with a diagnosis of Periprosthetic Femur Fracture Right who failed conservative measures and elected for surgical management.    The risks benefits and alternatives were discussed with the patient preoperatively including but not limited to the risks of infection, bleeding, nerve injury, cardiopulmonary complications, the need for revision surgery, among others, and the patient was willing to proceed.  OPERATIVE IMPLANTS: Synthes long trochanteric nail 12 x 360 locked proximally with a spiral blade 90 mm  @ENCIMAGES @  OPERATIVE FINDINGS: No signs of infection C-arm fluoroscopy verified alignment AP and lateral planes after placement of the rod.  OPERATIVE PROCEDURE: Patient was brought the operating room underwent a general anesthetic.  After adequate levels anesthesia were obtained patient was placed supine on the [facility] fracture table the right lower extremity was placed in the boot traction the fracture was reduced the opposite leg was padded and with Covan secured to the table frame.  The right lower extremity was then prepped using DuraPrep draped into a sterile field with a shower curtain a timeout was called.  The proximal and distal incisions were used these were opened up this was carried down to the implant.  The locking bolt proximally was released the spiral blade was removed and the distal locking screw was removed.  The screw was then removed retrograde without complications.  A guidewire was then advanced across the fracture site and this was sequentially reamed to 13 mm for a 12 mm nail.  A 12 mm nail was inserted  centimeters fluoroscopy verified alignment of both AP and lateral planes.  The spiral blade was placed using the alignment jig at 96 mm.  The spiral blade was secured in place with the proximal locking screw.  The wounds were irrigated normal saline incisions were closed using 2-0 nylon and Mepilex dressings were applied.  Patient was taken the PACU in stable condition.   DISCHARGE PLANNING:  Antibiotic duration: 24-hour antibiotics  Weightbearing: Weightbearing as tolerated with assistance on the right, for transfers only no gait training  Pain medication: Tylenol for pain  Dressing care/ Wound VAC: Reinforce dressings as needed  Ambulatory devices: Patient is essentially nonambulatory she may be weightbearing for transfers but no gait training.  Discharge to: Back to skilled care.  Orders are written for a wheelchair with leg rests.  Tylenol for pain at discharge aspirin for DVT prophylaxis at discharge.  No gait training at discharge only transfers with assistance  Follow-up: In the office 1 week post operative.

## 2018-12-26 NOTE — Anesthesia Procedure Notes (Signed)
Procedure Name: Intubation Date/Time: 12/26/2018 9:19 AM Performed by: Griffin Dakin, CRNA Pre-anesthesia Checklist: Patient identified, Emergency Drugs available, Suction available and Patient being monitored Patient Re-evaluated:Patient Re-evaluated prior to induction Oxygen Delivery Method: Circle system utilized Preoxygenation: Pre-oxygenation with 100% oxygen Induction Type: IV induction Ventilation: Mask ventilation without difficulty Laryngoscope Size: Mac and 3 Grade View: Grade II Tube type: Oral Tube size: 7.0 mm Number of attempts: 1 Airway Equipment and Method: Stylet and Oral airway Placement Confirmation: ETT inserted through vocal cords under direct vision,  positive ETCO2 and breath sounds checked- equal and bilateral Secured at: 22 cm Tube secured with: Tape Dental Injury: Teeth and Oropharynx as per pre-operative assessment

## 2018-12-26 NOTE — Progress Notes (Signed)
Lower incision right hip oozing blood.   Holding pressure.  Dr. Sharol Given notified.  Continue to hold pressure and monitor.

## 2018-12-26 NOTE — Social Work (Signed)
CSW acknowledging consult for SNF placement. Will follow for therapy recommendations needed to best determine disposition/for insurance authorization.   Embry Huss, MSW, LCSWA [place] [facility] [phone]   

## 2018-12-27 LAB — CBC
HCT: 27.5 % — ABNORMAL LOW (ref 36.0–46.0)
Hemoglobin: 8.6 g/dL — ABNORMAL LOW (ref 12.0–15.0)
MCH: 30 pg (ref 26.0–34.0)
MCHC: 31.3 g/dL (ref 30.0–36.0)
MCV: 95.8 fL (ref 80.0–100.0)
Platelets: 303 10*3/uL (ref 150–400)
RBC: 2.87 MIL/uL — ABNORMAL LOW (ref 3.87–5.11)
RDW: 13.2 % (ref 11.5–15.5)
WBC: 11.3 10*3/uL — ABNORMAL HIGH (ref 4.0–10.5)
nRBC: 0 % (ref 0.0–0.2)

## 2018-12-27 LAB — BASIC METABOLIC PANEL
Anion gap: 11 (ref 5–15)
BUN: 16 mg/dL (ref 8–23)
CO2: 22 mmol/L (ref 22–32)
Calcium: 8.4 mg/dL — ABNORMAL LOW (ref 8.9–10.3)
Chloride: 106 mmol/L (ref 98–111)
Creatinine, Ser: 0.67 mg/dL (ref 0.44–1.00)
GFR calc Af Amer: >60 mL/min (ref >60)
GFR calc non Af Amer: >60 mL/min (ref >60)
Glucose, Bld: 113 mg/dL — ABNORMAL HIGH (ref 70–99)
Potassium: 4.3 mmol/L (ref 3.5–5.1)
Sodium: 139 mmol/L (ref 135–145)

## 2018-12-27 NOTE — Plan of Care (Signed)
  Problem: Safety: Goal: Ability to remain free from injury will improve Outcome: Progressing   

## 2018-12-27 NOTE — Progress Notes (Signed)
Patient ID: Desiree Salinas, female   DOB: 1950-06-07, 68 y.o.   MRN: [id number] Patient is postoperative day 1 removal of deep retained hardware and placement of a long intramedullary nail status post periprosthetic femur fracture.  The dressing is clean and dry this morning.    Plan for discharge to skilled nursing.  No gait training.

## 2018-12-27 NOTE — Progress Notes (Signed)
PROGRESS NOTE    Desiree Salinas  [id number] DOB: January 09, 1951 DOA: 12/24/2018 PCP: [name], MD    Brief Narrative:  68 y.o. female with medical history significant of dementia, anemia, anxiety, arthritis, COPD, depression, hyperlipidemia, hospital admission 9/22-9/28 for intertrochanteric fracture of right hip status post intramedullary nailing by Dr. Sharol Given on 9/23 presenting from her nursing home with complaints of right hip pain.  Patient is somnolent after receiving fentanyl for pain.  No history could be obtained from her.  Per caregiver at bedside, this morning patient slipped out of her wheelchair while staff at the nursing home were helping her take a bath.  She did not hit her head or lose consciousness.  Patient has been complaining of pain in her right hip.  ED Course: Hemodynamically stable.  White blood cell count 14.9.  Hemoglobin 11.4, stable compared to prior labs.  UA pending.  SARS-CoV-2 test pending.  X-ray of right hip and pelvis showing previous intramedullary rod and screw fixation of comminuted intertrochanteric fracture, now with acute displaced periprosthetic fracture involving the proximal shaft of the femur.  No femoral head dislocation.  Assessment & Plan:   Principal Problem:   Periprosthetic fracture of hip Active Problems:   HYPERCHOLESTEROLEMIA   Dementia with aggressive behavior (HCC)   Leukocytosis   COPD (chronic obstructive pulmonary disease) (HCC)  R hip periprosthetic fracture - X-ray of right hip and pelvis showing previous intramedullary rod and screw fixation of comminuted intertrochanteric fracture, now with acute displaced periprosthetic fracture involving the proximal shaft of the femur.   -No femoral head dislocation.  -Orthopedic Surgery following. Pt now s/p surgery 10/23 -Continue with analgesics as needed, seems stable this AM  Leukocytosis -White blood cell count 14.9, improved. Likely secondary to  following acute fracture  -Chest x-ray reviewed, clear -UA with many bacteria, however pt has no UTI related symptoms -Repeat CBC in AM  COPD -Remains stable at this time -On minimal O2 support. No wheezing appreciated -Continue Combivent as needed.  Dementia with Aggressive behavior -Seems stable at this time -Have resumed home meds as tolerated -Recent EKG reviewed, QTc noted to be 452  HLD -Will resume home statin as tolerated  DVT prophylaxis: Heparin subQ Code Status: Full Family Communication: Pt in room, family over phone Disposition Plan: Uncertain at this time  Consultants:   Orthopedic Surgery  Procedures:     Antimicrobials: Anti-infectives (From admission, onward)   Start     Dose/Rate Route Frequency Ordered Stop   12/26/18 0600  ceFAZolin (ANCEF) IVPB 2g/100 mL premix     2 g 200 mL/hr over 30 Minutes Intravenous On call to O.R. 12/25/18 2136 12/26/18 [id number]      Subjective: Pleasantly confused. Per sitter, no issues with dysuria or abd/pubic pain  Objective: Vitals:   12/26/18 2234 12/27/18 0441 12/27/18 0741 12/27/18 1416  BP: 105/60 100/62 115/66 121/71  Pulse:  91 93 99  Resp:   15 18  Temp:  97.8 F (36.6 C) 97.7 F (36.5 C) 98.2 F (36.8 C)  TempSrc:  Oral Oral Oral  SpO2:  100% 100% 95%  Weight:  58.9 kg    Height:        Intake/Output Summary (Last 24 hours) at 12/27/2018 1619 Last data filed at 12/27/2018 0900 Gross per 24 hour  Intake 120 ml  Output 400 ml  Net -280 ml   Filed Weights   12/24/18 1344 12/27/18 0441  Weight: 54.4 kg 58.9 kg    Examination: General exam:  Conversant, confused, in no acute distress Respiratory system: normal chest rise, clear, no audible wheezing Cardiovascular system: regular rhythm, s1-s2 Gastrointestinal system: Nondistended, nontender, pos BS Central nervous system: No seizures, no tremors Extremities: No cyanosis, no joint deformities Skin: No rashes, no pallor Psychiatry: Difficult to assess given current  mentation  Data Reviewed: I have personally reviewed following labs and imaging studies  CBC: Recent Labs  Lab 12/24/18 1837 12/25/18 0841 12/26/18 0327 12/27/18 0319  WBC 14.9* 11.4* 11.2* 11.3*  NEUTROABS 12.2*  --   --   --   HGB 11.4* 10.3* 9.8* 8.6*  HCT 36.1 32.8* 30.4* 27.5*  MCV 95.5 94.5 93.3 95.8  PLT 351 365 304 [id number]   Basic Metabolic Panel: Recent Labs  Lab 12/24/18 1837 12/26/18 0327 12/27/18 0319  NA 137 138 139  K 4.3 4.1 4.3  CL 103 106 106  CO2 21* 24 22  GLUCOSE 115* 123* 113*  BUN 13 15 16   CREATININE 0.87 0.69 0.67  CALCIUM 8.7* 8.4* 8.4*   GFR: Estimated Creatinine Clearance: 56.4 mL/min (by C-G formula based on SCr of 0.67 mg/dL). Liver Function Tests: No results for input(s): AST, ALT, ALKPHOS, BILITOT, PROT, ALBUMIN in the last 168 hours. No results for input(s): LIPASE, AMYLASE in the last 168 hours. No results for input(s): AMMONIA in the last 168 hours. Coagulation Profile: No results for input(s): INR, PROTIME in the last 168 hours. Cardiac Enzymes: No results for input(s): CKTOTAL, CKMB, CKMBINDEX, TROPONINI in the last 168 hours. BNP (last 3 results) No results for input(s): PROBNP in the last 8760 hours. HbA1C: No results for input(s): HGBA1C in the last 72 hours. CBG: No results for input(s): GLUCAP in the last 168 hours. Lipid Profile: No results for input(s): CHOL, HDL, LDLCALC, TRIG, CHOLHDL, LDLDIRECT in the last 72 hours. Thyroid Function Tests: No results for input(s): TSH, T4TOTAL, FREET4, T3FREE, THYROIDAB in the last 72 hours. Anemia Panel: No results for input(s): VITAMINB12, FOLATE, FERRITIN, TIBC, IRON, RETICCTPCT in the last 72 hours. Sepsis Labs: No results for input(s): PROCALCITON, LATICACIDVEN in the last 168 hours.  Recent Results (from the past 240 hour(s))  SARS Coronavirus 2 by RT PCR (hospital order, performed in [facility] hospital lab) Nasopharyngeal Nasopharyngeal Swab     Status: None   Collection  Time: 12/24/18  8:44 PM   Specimen: Nasopharyngeal Swab  Result Value Ref Range Status   SARS Coronavirus 2 NEGATIVE NEGATIVE Final    Comment: (NOTE) If result is NEGATIVE SARS-CoV-2 target nucleic acids are NOT DETECTED. The SARS-CoV-2 RNA is generally detectable in upper and lower  respiratory specimens during the acute phase of infection. The lowest  concentration of SARS-CoV-2 viral copies this assay can detect is 250  copies / mL. A negative result does not preclude SARS-CoV-2 infection  and should not be used as the sole basis for treatment or other  patient management decisions.  A negative result may occur with  improper specimen collection / handling, submission of specimen other  than nasopharyngeal swab, presence of viral mutation(s) within the  areas targeted by this assay, and inadequate number of viral copies  (<250 copies / mL). A negative result must be combined with clinical  observations, patient history, and epidemiological information. If result is POSITIVE SARS-CoV-2 target nucleic acids are DETECTED. The SARS-CoV-2 RNA is generally detectable in upper and lower  respiratory specimens dur ing the acute phase of infection.  Positive  results are indicative of active infection with SARS-CoV-2.  Clinical  correlation with patient history and other diagnostic information is  necessary to determine patient infection status.  Positive results do  not rule out bacterial infection or co-infection with other viruses. If result is PRESUMPTIVE POSTIVE SARS-CoV-2 nucleic acids MAY BE PRESENT.   A presumptive positive result was obtained on the submitted specimen  and confirmed on repeat testing.  While 2019 novel coronavirus  (SARS-CoV-2) nucleic acids may be present in the submitted sample  additional confirmatory testing may be necessary for epidemiological  and / or clinical management purposes  to differentiate between  SARS-CoV-2 and other Sarbecovirus currently known  to infect humans.  If clinically indicated additional testing with an alternate test  methodology [phone]) is advised. The SARS-CoV-2 RNA is generally  detectable in upper and lower respiratory sp ecimens during the acute  phase of infection. The expected result is Negative. Fact Sheet for Patients:  StrictlyIdeas.no Fact Sheet for Healthcare Providers: BankingDealers.co.za This test is not yet approved or cleared by the [place] FDA and has been authorized for detection and/or diagnosis of SARS-CoV-2 by FDA under an Emergency Use Authorization (EUA).  This EUA will remain in effect (meaning this test can be used) for the duration of the COVID-19 declaration under Section 564(b)(1) of the Act, 21 U.S.C. section 360bbb-3(b)(1), unless the authorization is terminated or revoked sooner. Performed at [facility], [place] [address] [place], [place] [address]   Surgical pcr screen     Status: None   Collection Time: 12/25/18  3:44 AM   Specimen: Nasal Swab  Result Value Ref Range Status   MRSA, PCR NEGATIVE NEGATIVE Final   Staphylococcus aureus NEGATIVE NEGATIVE Final    Comment: (NOTE) The Xpert SA Assay (FDA approved for NASAL specimens in patients 41 years of age and older), is one component of a comprehensive surveillance program. It is not intended to diagnose infection nor to guide or monitor treatment. Performed at [facility], [place] [address], [place], [place] [address]      Radiology Studies: Dg C-arm 1-60 Min-no Report  Result Date: 12/26/2018 Fluoroscopy was utilized by the requesting physician.  No radiographic interpretation.    Scheduled Meds: . aspirin  325 mg Oral Q breakfast  . atorvastatin  40 mg Oral QHS  . busPIRone  20 mg Oral Daily  . docusate sodium  100 mg Oral BID  . escitalopram  20 mg Oral Daily  . feeding supplement (ENSURE ENLIVE)  237 mL Oral Q1500  . Melatonin  3 mg Oral QHS  .  pantoprazole  40 mg Oral Daily  . pindolol  10 mg Oral Daily  . QUEtiapine  100 mg Oral QHS   Continuous Infusions: . lactated ringers Stopped (12/26/18 1102)  . methocarbamol (ROBAXIN) IV       LOS: 3 days   Marylu Lund, MD [facility] Pager On Amion  If 7PM-7AM, please contact night-coverage 12/27/2018, 4:19 PM

## 2018-12-27 NOTE — Evaluation (Signed)
Occupational Therapy Evaluation Patient Details Name: Desiree Salinas MRN: [id number] DOB: 08-16-1950 Today's Date: 12/27/2018    History of Present Illness Desiree Salinas is a  68 y.o. female with a diagnosis of Periprosthetic Femur Fracture Right who failed conservative measures and elected for surgical management.   Clinical Impression   Pt with decline in function and safety with ADLs and ADL mobility with impaired strength, balance and endurance; pt with history of dementia and ws living at [facility] on memory care unit prior to hip fx in September. Pt has been to a SNF since that time.Pt's PCA present to provide PLOF and other info. Pt's private caregiver states that she could feed herself and is physically able to do ADLs, however her cogntive deficits required her to have assist most days and that pt uses a w/c and is able to pull up on grab bar for SPT with staff. Pt pleasantly confused and currently requires mutlimodal cued for task initiation, slow processing for simple grooming tasks. Pt requires total A + with bed mobility and total A +3 for squat pivot transfer to recliner. Pt would benefit from acute OT services to address impairments to maximize level of function and safety    Follow Up Recommendations  SNF;Supervision/Assistance - 24 hour    Equipment Recommendations  None recommended by OT    Recommendations for Other Services       Precautions / Restrictions Precautions Precautions: Fall Restrictions Weight Bearing Restrictions: Yes RLE Weight Bearing: Weight bearing as tolerated      Mobility Bed Mobility Overal bed mobility: Needs Assistance Bed Mobility: Supine to Sit     Supine to sit: Total assist;+2 for physical assistance     General bed mobility comments: pt unable to follow verbal cues to initiate sitting EOB, Poor sitting balance intially at EOB requring min - mod A for support  Transfers Overall transfer level: Needs assistance    Transfers: Sit to/from [employer] Transfers Sit to Stand: Total assist;+2 physical assistance   Squat pivot transfers: Total assist;+2 physical assistance     General transfer comment: may be able to use Stedy    Balance Overall balance assessment: Needs assistance Sitting-balance support: Feet supported;Bilateral upper extremity supported Sitting balance-Leahy Scale: Poor Sitting balance - Comments: Poor sitting balance intially at EOB requring min - mod A for support   Standing balance support: Bilateral upper extremity supported Standing balance-Leahy Scale: Zero                             ADL either performed or assessed with clinical judgement   ADL Overall ADL's : Needs assistance/impaired Eating/Feeding: Minimal assistance;Sitting;With caregiver independent assisting   Grooming: Wash/dry hands;Wash/dry face;Min guard;Sitting;With caregiver independent assisting   Upper Body Bathing: Moderate assistance;Sitting;With caregiver independent assisting   Lower Body Bathing: Total assistance;With caregiver independent assisting   Upper Body Dressing : Moderate assistance;Sitting;With caregiver independent assisting   Lower Body Dressing: Total assistance   Toilet Transfer: Total assistance;+2 for physical assistance;Squat-pivot;Cueing for safety Toilet Transfer Details (indicate cue type and reason): simulated to recliner Toileting- Clothing Manipulation and Hygiene: Total assistance;Bed level       Functional mobility during ADLs: Total assistance;+2 for physical assistance General ADL Comments: pt limited by cognitive impairments     Vision Patient Visual Report: No change from baseline       Perception     Praxis      Pertinent Vitals/Pain  Pain Assessment: Faces Faces Pain Scale: Hurts whole lot Pain Location: LEs during mobility Pain Descriptors / Indicators: Grimacing;Guarding;Moaning Pain Intervention(s): Limited activity within  patient's tolerance;Monitored during session;Premedicated before session;Repositioned     Hand Dominance Right   Extremity/Trunk Assessment Upper Extremity Assessment Upper Extremity Assessment: Generalized weakness   Lower Extremity Assessment Lower Extremity Assessment: Defer to PT evaluation   Cervical / Trunk Assessment Cervical / Trunk Assessment: Kyphotic   Communication Communication Communication: Expressive difficulties   Cognition Arousal/Alertness: Awake/alert Behavior During Therapy: Anxious Overall Cognitive Status: History of cognitive impairments - at baseline                                 General Comments: pt pleasantly confused   General Comments       Exercises     Shoulder Instructions      Home Living Family/patient expects to be discharged to:: Skilled nursing facility                                 Additional Comments: pt from memory care at [facility]      Prior Functioning/Environment Level of Independence: Needs assistance  Gait / Transfers Assistance Needed: Caregiver Desiree Salinas) present and reports prior to initial admission a few weeks ago, pt was ambulatory around house without DME although unsteady with falls; had almost 24/7 caregiver support. Since recent d/c, has been between multiple SNFs until finally settling at [facility] Assistance Needed: total A due to cognitive impairments   Comments: Pt's caregiver states that she can feed herself and is physically able to do ADLs, however her cogntive deficits requires her to have total A. Pt uses a w/c and is able to pull up on grab bar for SPT with staff        OT Problem List: Decreased strength;Impaired balance (sitting and/or standing);Decreased cognition;Decreased knowledge of precautions;Pain;Decreased safety awareness;Decreased activity tolerance;Decreased knowledge of use of DME or AE      OT Treatment/Interventions:  Self-care/ADL training;DME and/or AE instruction;Therapeutic activities;Patient/family education    OT Goals(Current goals can be found in the care plan section) Acute Rehab OT Goals Patient Stated Goal: none stated OT Goal Formulation: Patient unable to participate in goal setting Time For Goal Achievement: 01/10/19 Potential to Achieve Goals: Fair ADL Goals Pt Will Perform Eating: with supervision;with set-up;sitting;with caregiver independent in assisting Pt Will Perform Grooming: with supervision;with set-up;sitting;with caregiver independent in assisting Pt Will Perform Upper Body Bathing: with min assist;sitting;with caregiver independent in assisting Pt Will Perform Upper Body Dressing: with min assist;sitting;with caregiver independent in assisting Pt Will Transfer to Toilet: with max assist;squat pivot transfer;stand pivot transfer;bedside commode Additional ADL Goal #1: pt will sit EOB with max - mod a to sit EOB for simple grooming and UB ADL tasks  OT Frequency: Min 2X/week   Barriers to D/C: Decreased caregiver support          Co-evaluation PT/OT/SLP Co-Evaluation/Treatment: Yes Reason for Co-Treatment: Necessary to address cognition/behavior during functional activity;For patient/therapist safety;To address functional/ADL transfers PT goals addressed during session: Mobility/safety with mobility;Balance;Strengthening/ROM OT goals addressed during session: ADL's and self-care;Proper use of Adaptive equipment and DME      AM-PAC OT "6 Clicks" Daily Activity     Outcome Measure Help from another person eating meals?: None Help from another person taking care of personal grooming?: A  Little Help from another person toileting, which includes using toliet, bedpan, or urinal?: Total Help from another person bathing (including washing, rinsing, drying)?: Total Help from another person to put on and taking off regular upper body clothing?: A Little Help from another person to  put on and taking off regular lower body clothing?: Total 6 Click Score: 13   End of Session Equipment Utilized During Treatment: Gait belt Nurse Communication: Mobility status  Activity Tolerance: Patient limited by pain Patient left: in chair;with call bell/phone within reach;with chair alarm set;with family/visitor present  OT Visit Diagnosis: Unsteadiness on feet (R26.81);Other abnormalities of gait and mobility (R26.89);History of falling (Z91.81);Muscle weakness (generalized) (M62.81);Other symptoms and signs involving cognitive function;Pain Pain - Right/Left: Right Pain - part of body: Hip                Time: [id number] OT Time Calculation (min): 25 min Charges:  OT General Charges $OT Visit: 1 Visit OT Evaluation $OT Eval Moderate Complexity: 1 Mod    Britt Bottom 12/27/2018, 1:13 PM

## 2018-12-27 NOTE — Evaluation (Signed)
Physical Therapy Evaluation Patient Details Name: DELEYZA TAMES MRN: [id number] DOB: [birth year] Today's Date: 12/27/2018   History of Present Illness  NYDIA IORIO is a 68 y.o. female who presents with right periprosthetic femur fracture.  Patient has advanced dementia and has around-the-clock care she fell from her wheelchair.  She is a month out from intramedullary fixation for right intertrochanteric hip fracture. PMH including but not limited to dementia, COPD and substance abuse.    Clinical Impression  Pt presented supine in bed with HOB elevated, awake and willing to participate in therapy session. Pt's caregiver present throughout session and providing history information as pt with significant cognitive deficits at baseline. Prior to initial admission a few weeks ago, pt was ambulatory around house without DME although unsteady with falls; had almost 24/7 caregiver support. Since recent d/c, has been between multiple SNFs until finally settling at [facility]. At the time of evaluation, pt greatly limited secondary to cognitive deficits and pain. She required total A x2 for bed mobility and transfers. Pt would continue to benefit from skilled physical therapy services at this time while admitted and after d/c to address the below listed limitations in order to improve overall safety and independence with functional mobility.      Follow Up Recommendations SNF    Equipment Recommendations  None recommended by PT    Recommendations for Other Services       Precautions / Restrictions Precautions Precautions: Fall Restrictions Weight Bearing Restrictions: Yes RLE Weight Bearing: Weight bearing as tolerated      Mobility  Bed Mobility Overal bed mobility: Needs Assistance Bed Mobility: Supine to Sit     Supine to sit: Total assist;+2 for physical assistance     General bed mobility comments: total A x2 for all aspects with use of bed pads to position pt's hips at  EOB; pt unable to follow any commands  Transfers Overall transfer level: Needs assistance Equipment used: 2 person hand held assist Transfers: Sit to/from [employer] Sit to Stand: Total assist;+2 physical assistance   Squat pivot transfers: Total assist;+2 physical assistance     General transfer comment: face-to-face method with use of gait belt and bed pads  Ambulation/Gait                Stairs            Wheelchair Mobility    Modified Rankin (Stroke Patients Only)       Balance Overall balance assessment: Needs assistance Sitting-balance support: Feet supported;Bilateral upper extremity supported Sitting balance-Leahy Scale: Poor Sitting balance - Comments: Poor sitting balance intially at EOB requring min - mod A for support Postural control: Posterior lean;Left lateral lean Standing balance support: Bilateral upper extremity supported Standing balance-Leahy Scale: Zero Standing balance comment: total A x2                             Pertinent Vitals/Pain Pain Assessment: Faces Faces Pain Scale: Hurts whole lot Pain Location: R LE during mobility Pain Descriptors / Indicators: Grimacing;Guarding;Moaning Pain Intervention(s): Monitored during session;Repositioned    Home Living Family/patient expects to be discharged to:: Skilled nursing facility                 Additional Comments: pt from memory care at [facility]    Prior Function Level of Independence: Needs assistance   Gait / Transfers Assistance Needed: Caregiver [name]) present and reports prior to  initial admission a few weeks ago, pt was ambulatory around house without DME although unsteady with falls; had almost 24/7 caregiver support. Since recent d/c, has been between multiple SNFs until finally settling at [facility] Assistance Needed: total A due to cognitive impairments  Comments: Pt's caregiver states that  she can feed herself and is physically able to do ADLs, however her cogntive deficits requires her to have total A. Pt uses a w/c and is able to pull up on grab bar for SPT with staff     Hand Dominance   Dominant Hand: Right    Extremity/Trunk Assessment   Upper Extremity Assessment Upper Extremity Assessment: Defer to OT evaluation;Difficult to assess due to impaired cognition    Lower Extremity Assessment Lower Extremity Assessment: Generalized weakness;RLE deficits/detail;Difficult to assess due to impaired cognition RLE Deficits / Details: pt with decreased strength and ROM limitations secondary to post-op pain and weakness.     Cervical / Trunk Assessment Cervical / Trunk Assessment: Kyphotic  Communication   Communication: Expressive difficulties  Cognition Arousal/Alertness: Awake/alert Behavior During Therapy: Anxious Overall Cognitive Status: History of cognitive impairments - at baseline                                 General Comments: pt pleasantly confused throughout; requiring multimodal constant cueing to participate and complete task      General Comments      Exercises General Exercises - Lower Extremity Long Arc Quad: AAROM;Right;10 reps;Seated   Assessment/Plan    PT Assessment Patient needs continued PT services  PT Problem List Decreased strength;Decreased range of motion;Decreased mobility;Decreased activity tolerance;Decreased balance;Decreased coordination;Decreased cognition;Decreased knowledge of use of DME;Decreased safety awareness;Decreased knowledge of precautions;Pain       PT Treatment Interventions DME instruction;Gait training;Stair training;Functional mobility training;Therapeutic activities;Therapeutic exercise;Balance training;Neuromuscular re-education;Cognitive remediation;Patient/family education    PT Goals (Current goals can be found in the Care Plan section)  Acute Rehab PT Goals Patient Stated Goal: none  stated PT Goal Formulation: Patient unable to participate in goal setting Time For Goal Achievement: 01/10/19 Potential to Achieve Goals: Fair    Frequency Min 2X/week   Barriers to discharge        Co-evaluation PT/OT/SLP Co-Evaluation/Treatment: Yes Reason for Co-Treatment: Necessary to address cognition/behavior during functional activity;For patient/therapist safety;To address functional/ADL transfers PT goals addressed during session: Mobility/safety with mobility;Balance;Strengthening/ROM OT goals addressed during session: ADL's and self-care;Proper use of Adaptive equipment and DME       AM-PAC PT "6 Clicks" Mobility  Outcome Measure Help needed turning from your back to your side while in a flat bed without using bedrails?: Total Help needed moving from lying on your back to sitting on the side of a flat bed without using bedrails?: Total Help needed moving to and from a bed to a chair (including a wheelchair)?: Total Help needed standing up from a chair using your arms (e.g., wheelchair or bedside chair)?: Total Help needed to walk in hospital room?: Total Help needed climbing 3-5 steps with a railing? : Total 6 Click Score: 6    End of Session Equipment Utilized During Treatment: Gait belt Activity Tolerance: Patient limited by pain Patient left: in chair;with call bell/phone within reach;with chair alarm set;with nursing/sitter in room Nurse Communication: Mobility status;Need for lift equipment PT Visit Diagnosis: Other abnormalities of gait and mobility (R26.89);Pain Pain - Right/Left: Right Pain - part of body: Hip  Time: [id number] PT Time Calculation (min) (ACUTE ONLY): 25 min   Charges:   PT Evaluation $PT Eval Moderate Complexity: 1 Mod          Sherie Don, PT, DPT  Acute Rehabilitation Services Pager [phone] [facility] [place] 12/27/2018, 1:23 PM

## 2018-12-28 LAB — CBC WITH DIFFERENTIAL/PLATELET
Abs Immature Granulocytes: 0.04 10*3/uL (ref 0.00–0.07)
Basophils Absolute: 0.1 10*3/uL (ref 0.0–0.1)
Basophils Relative: 1 %
Eosinophils Absolute: 0.4 10*3/uL (ref 0.0–0.5)
Eosinophils Relative: 4 %
HCT: 24.6 % — ABNORMAL LOW (ref 36.0–46.0)
Hemoglobin: 7.7 g/dL — ABNORMAL LOW (ref 12.0–15.0)
Immature Granulocytes: 0 %
Lymphocytes Relative: 12 %
Lymphs Abs: 1.2 10*3/uL (ref 0.7–4.0)
MCH: 29.4 pg (ref 26.0–34.0)
MCHC: 31.3 g/dL (ref 30.0–36.0)
MCV: 93.9 fL (ref 80.0–100.0)
Monocytes Absolute: 0.8 10*3/uL (ref 0.1–1.0)
Monocytes Relative: 8 %
Neutro Abs: 7.2 10*3/uL (ref 1.7–7.7)
Neutrophils Relative %: 75 %
Platelets: 302 10*3/uL (ref 150–400)
RBC: 2.62 MIL/uL — ABNORMAL LOW (ref 3.87–5.11)
RDW: 13.2 % (ref 11.5–15.5)
WBC: 9.6 10*3/uL (ref 4.0–10.5)
nRBC: 0 % (ref 0.0–0.2)

## 2018-12-28 MED ORDER — SODIUM CHLORIDE 0.9 % IV BOLUS
500.0000 mL | Freq: Once | INTRAVENOUS | Status: AC
  Administered 2018-12-28: 500 mL via INTRAVENOUS

## 2018-12-28 MED ORDER — PINDOLOL 5 MG PO TABS
5.0000 mg | ORAL_TABLET | Freq: Every day | ORAL | Status: DC
  Administered 2018-12-28: 5 mg via ORAL
  Filled 2018-12-28 (×2): qty 1

## 2018-12-28 NOTE — Progress Notes (Signed)
Blood pressure, 82/52, manual, collected by RN.  [facility] notified.

## 2018-12-28 NOTE — Progress Notes (Signed)
PROGRESS NOTE    Desiree Salinas  [id number] DOB: Jun 03, 1950 DOA: 12/24/2018 PCP: [name], MD    Brief Narrative:  68 y.o. female with medical history significant of dementia, anemia, anxiety, arthritis, COPD, depression, hyperlipidemia, hospital admission 9/22-9/28 for intertrochanteric fracture of right hip status post intramedullary nailing by Dr. Sharol Given on 9/23 presenting from her nursing home with complaints of right hip pain.  Patient is somnolent after receiving fentanyl for pain.  No history could be obtained from her.  Per caregiver at bedside, this morning patient slipped out of her wheelchair while staff at the nursing home were helping her take a bath.  She did not hit her head or lose consciousness.  Patient has been complaining of pain in her right hip.  ED Course: Hemodynamically stable.  White blood cell count 14.9.  Hemoglobin 11.4, stable compared to prior labs.  UA pending.  SARS-CoV-2 test pending.  X-ray of right hip and pelvis showing previous intramedullary rod and screw fixation of comminuted intertrochanteric fracture, now with acute displaced periprosthetic fracture involving the proximal shaft of the femur.  No femoral head dislocation.  Assessment & Plan:   Principal Problem:   Periprosthetic fracture of hip Active Problems:   HYPERCHOLESTEROLEMIA   Dementia with aggressive behavior (HCC)   Leukocytosis   COPD (chronic obstructive pulmonary disease) (HCC)  R hip periprosthetic fracture - X-ray of right hip and pelvis showing previous intramedullary rod and screw fixation of comminuted intertrochanteric fracture, now with acute displaced periprosthetic fracture involving the proximal shaft of the femur.   -No femoral head dislocation.  -Orthopedic Surgery following. Pt now s/p surgery 10/23 -Will continue with analgesics as needed. Plan d/c to SNF. SW following  Leukocytosis -White blood cell count 14.9, improved. Likely secondary to  following acute  fracture -Chest x-ray reviewed, clear -UA with many bacteria, no UTI related symptoms per caregiver or staff -Resolved  COPD -Remains stable at this time -On minimal O2 support. No wheezing appreciated -Continue Combivent as tolerated  Dementia with Aggressive behavior -Have resumed home meds as tolerated -Recent EKG reviewed, QTc noted to be 452 -Remains stable at this time  HLD -continued on home statin as tolerated  Hypotension -Overnight events noted. SBP into the 70's -Follow CBC trend and transfuse if hgb<7.0 -Have decreased pindolol dose to 5mg  from 10mg   Acute blood loss anemia -Hgb down to 7.7 this AM, suspect related to recent surgery -Will repeat CBC in AM, transfuse if hgb <7.0 -Will check iron panel  DVT prophylaxis: Heparin subQ Code Status: Full Family Communication: Pt in room, family not at bedsie Disposition Plan: SNF when bed available  Consultants:   Orthopedic Surgery  Procedures:     Antimicrobials: Anti-infectives (From admission, onward)   Start     Dose/Rate Route Frequency Ordered Stop   12/26/18 0600  ceFAZolin (ANCEF) IVPB 2g/100 mL premix     2 g 200 mL/hr over 30 Minutes Intravenous On call to O.R. 12/25/18 2136 12/26/18 [id number]      Subjective: Without issues this AM. Overnight events noted. Noted to be hypotensive, requiring IVF bolus  Objective: Vitals:   12/28/18 0252 12/28/18 0303 12/28/18 0352 12/28/18 0818  BP: (!) 70/45 (!) 82/52 (!) 88/46 (!) 103/54  Pulse: 95   96  Resp: 15   15  Temp: 98.6 F (37 C)   99.3 F (37.4 C)  TempSrc:    Oral  SpO2: 94%   95%  Weight:      Height:  Intake/Output Summary (Last 24 hours) at 12/28/2018 1235 Last data filed at 12/28/2018 0900 Gross per 24 hour  Intake 480 ml  Output 400 ml  Net 80 ml   Filed Weights   12/24/18 1344 12/27/18 0441  Weight: 54.4 kg 58.9 kg    Examination: General exam: Asleep, laying in bed, in nad Respiratory system: Normal respiratory  effort, no wheezing Cardiovascular system: regular rate, s1, s2 Gastrointestinal system: Soft, nondistended, positive BS Central nervous system: CN2-12 grossly intact, strength intact Extremities: Perfused, no clubbing Skin: Normal skin turgor, no notable skin lesions seen Psychiatry: Difficult to assess given mentation   Data Reviewed: I have personally reviewed following labs and imaging studies  CBC: Recent Labs  Lab 12/24/18 1837 12/25/18 0841 12/26/18 0327 12/27/18 0319 12/28/18 0452  WBC 14.9* 11.4* 11.2* 11.3* 9.6  NEUTROABS 12.2*  --   --   --  7.2  HGB 11.4* 10.3* 9.8* 8.6* 7.7*  HCT 36.1 32.8* 30.4* 27.5* 24.6*  MCV 95.5 94.5 93.3 95.8 93.9  PLT 351 365 304 303 [id number]   Basic Metabolic Panel: Recent Labs  Lab 12/24/18 1837 12/26/18 0327 12/27/18 0319  NA 137 138 139  K 4.3 4.1 4.3  CL 103 106 106  CO2 21* 24 22  GLUCOSE 115* 123* 113*  BUN 13 15 16   CREATININE 0.87 0.69 0.67  CALCIUM 8.7* 8.4* 8.4*   GFR: Estimated Creatinine Clearance: 56.4 mL/min (by C-G formula based on SCr of 0.67 mg/dL). Liver Function Tests: No results for input(s): AST, ALT, ALKPHOS, BILITOT, PROT, ALBUMIN in the last 168 hours. No results for input(s): LIPASE, AMYLASE in the last 168 hours. No results for input(s): AMMONIA in the last 168 hours. Coagulation Profile: No results for input(s): INR, PROTIME in the last 168 hours. Cardiac Enzymes: No results for input(s): CKTOTAL, CKMB, CKMBINDEX, TROPONINI in the last 168 hours. BNP (last 3 results) No results for input(s): PROBNP in the last 8760 hours. HbA1C: No results for input(s): HGBA1C in the last 72 hours. CBG: No results for input(s): GLUCAP in the last 168 hours. Lipid Profile: No results for input(s): CHOL, HDL, LDLCALC, TRIG, CHOLHDL, LDLDIRECT in the last 72 hours. Thyroid Function Tests: No results for input(s): TSH, T4TOTAL, FREET4, T3FREE, THYROIDAB in the last 72 hours. Anemia Panel: No results for input(s):  VITAMINB12, FOLATE, FERRITIN, TIBC, IRON, RETICCTPCT in the last 72 hours. Sepsis Labs: No results for input(s): PROCALCITON, LATICACIDVEN in the last 168 hours.  Recent Results (from the past 240 hour(s))  SARS Coronavirus 2 by RT PCR (hospital order, performed in [facility] hospital lab) Nasopharyngeal Nasopharyngeal Swab     Status: None   Collection Time: 12/24/18  8:44 PM   Specimen: Nasopharyngeal Swab  Result Value Ref Range Status   SARS Coronavirus 2 NEGATIVE NEGATIVE Final    Comment: (NOTE) If result is NEGATIVE SARS-CoV-2 target nucleic acids are NOT DETECTED. The SARS-CoV-2 RNA is generally detectable in upper and lower  respiratory specimens during the acute phase of infection. The lowest  concentration of SARS-CoV-2 viral copies this assay can detect is 250  copies / mL. A negative result does not preclude SARS-CoV-2 infection  and should not be used as the sole basis for treatment or other  patient management decisions.  A negative result may occur with  improper specimen collection / handling, submission of specimen other  than nasopharyngeal swab, presence of viral mutation(s) within the  areas targeted by this assay, and inadequate number of viral copies  (<  250 copies / mL). A negative result must be combined with clinical  observations, patient history, and epidemiological information. If result is POSITIVE SARS-CoV-2 target nucleic acids are DETECTED. The SARS-CoV-2 RNA is generally detectable in upper and lower  respiratory specimens dur ing the acute phase of infection.  Positive  results are indicative of active infection with SARS-CoV-2.  Clinical  correlation with patient history and other diagnostic information is  necessary to determine patient infection status.  Positive results do  not rule out bacterial infection or co-infection with other viruses. If result is PRESUMPTIVE POSTIVE SARS-CoV-2 nucleic acids MAY BE PRESENT.   A presumptive positive  result was obtained on the submitted specimen  and confirmed on repeat testing.  While 2019 novel coronavirus  (SARS-CoV-2) nucleic acids may be present in the submitted sample  additional confirmatory testing may be necessary for epidemiological  and / or clinical management purposes  to differentiate between  SARS-CoV-2 and other Sarbecovirus currently known to infect humans.  If clinically indicated additional testing with an alternate test  methodology [phone]) is advised. The SARS-CoV-2 RNA is generally  detectable in upper and lower respiratory sp ecimens during the acute  phase of infection. The expected result is Negative. Fact Sheet for Patients:  StrictlyIdeas.no Fact Sheet for Healthcare Providers: BankingDealers.co.za This test is not yet approved or cleared by the [place] FDA and has been authorized for detection and/or diagnosis of SARS-CoV-2 by FDA under an Emergency Use Authorization (EUA).  This EUA will remain in effect (meaning this test can be used) for the duration of the COVID-19 declaration under Section 564(b)(1) of the Act, 21 U.S.C. section 360bbb-3(b)(1), unless the authorization is terminated or revoked sooner. Performed at [facility], [place] [address] [place], [place] [address]   Surgical pcr screen     Status: None   Collection Time: 12/25/18  3:44 AM   Specimen: Nasal Swab  Result Value Ref Range Status   MRSA, PCR NEGATIVE NEGATIVE Final   Staphylococcus aureus NEGATIVE NEGATIVE Final    Comment: (NOTE) The Xpert SA Assay (FDA approved for NASAL specimens in patients 36 years of age and older), is one component of a comprehensive surveillance program. It is not intended to diagnose infection nor to guide or monitor treatment. Performed at [facility], [place] [address], [place], [place] [address]      Radiology Studies: No results found.  Scheduled Meds: . aspirin  325 mg Oral  Q breakfast  . atorvastatin  40 mg Oral QHS  . busPIRone  20 mg Oral Daily  . docusate sodium  100 mg Oral BID  . escitalopram  20 mg Oral Daily  . feeding supplement (ENSURE ENLIVE)  237 mL Oral Q1500  . Melatonin  3 mg Oral QHS  . pantoprazole  40 mg Oral Daily  . pindolol  5 mg Oral Daily  . QUEtiapine  100 mg Oral QHS   Continuous Infusions: . lactated ringers Stopped (12/26/18 1102)  . methocarbamol (ROBAXIN) IV       LOS: 4 days   Marylu Lund, MD [facility] Pager On Amion  If 7PM-7AM, please contact night-coverage 12/28/2018, 12:35 PM

## 2018-12-28 NOTE — Progress Notes (Signed)
Patient ID: [name], female   DOB: 03-Jun-1950, 68 y.o.   MRN: [id number] Is postoperative day 2 intramedullary nail fixation for periprosthetic femur fracture.  There is minimal drainage on the dressing.  Plan for discharge back to skilled nursing.  No gait training, weightbearing on the right leg as needed for transfers.

## 2018-12-28 NOTE — Plan of Care (Signed)
  Problem: Skin Integrity: Goal: Risk for impaired skin integrity will decrease Outcome: Progressing   

## 2018-12-28 NOTE — Progress Notes (Signed)
Blood pressure, 88/46, manual, collected by RN post bolus.   [facility] notified.

## 2018-12-29 ENCOUNTER — Encounter (HOSPITAL_COMMUNITY): Payer: Self-pay | Admitting: Orthopedic Surgery

## 2018-12-29 LAB — IRON AND TIBC
Iron: 20 ug/dL — ABNORMAL LOW (ref 28–170)
Saturation Ratios: 11 % (ref 10.4–31.8)
TIBC: 176 ug/dL — ABNORMAL LOW (ref 250–450)
UIBC: 156 ug/dL

## 2018-12-29 LAB — COMPREHENSIVE METABOLIC PANEL
ALT: 31 U/L (ref 0–44)
AST: 43 U/L — ABNORMAL HIGH (ref 15–41)
Albumin: 2.5 g/dL — ABNORMAL LOW (ref 3.5–5.0)
Alkaline Phosphatase: 72 U/L (ref 38–126)
Anion gap: 10 (ref 5–15)
BUN: 15 mg/dL (ref 8–23)
CO2: 23 mmol/L (ref 22–32)
Calcium: 8 mg/dL — ABNORMAL LOW (ref 8.9–10.3)
Chloride: 106 mmol/L (ref 98–111)
Creatinine, Ser: 0.63 mg/dL (ref 0.44–1.00)
GFR calc Af Amer: >60 mL/min (ref >60)
GFR calc non Af Amer: >60 mL/min (ref >60)
Glucose, Bld: 112 mg/dL — ABNORMAL HIGH (ref 70–99)
Potassium: 3.9 mmol/L (ref 3.5–5.1)
Sodium: 139 mmol/L (ref 135–145)
Total Bilirubin: 0.6 mg/dL (ref 0.3–1.2)
Total Protein: 5.4 g/dL — ABNORMAL LOW (ref 6.5–8.1)

## 2018-12-29 LAB — CBC
HCT: 24.8 % — ABNORMAL LOW (ref 36.0–46.0)
Hemoglobin: 7.9 g/dL — ABNORMAL LOW (ref 12.0–15.0)
MCH: 29.6 pg (ref 26.0–34.0)
MCHC: 31.9 g/dL (ref 30.0–36.0)
MCV: 92.9 fL (ref 80.0–100.0)
Platelets: 348 10*3/uL (ref 150–400)
RBC: 2.67 MIL/uL — ABNORMAL LOW (ref 3.87–5.11)
RDW: 13.2 % (ref 11.5–15.5)
WBC: 9.1 10*3/uL (ref 4.0–10.5)
nRBC: 0 % (ref 0.0–0.2)

## 2018-12-29 LAB — SARS CORONAVIRUS 2 BY RT PCR (HOSPITAL ORDER, PERFORMED IN ~~LOC~~ HOSPITAL LAB): SARS Coronavirus 2: NEGATIVE

## 2018-12-29 MED ORDER — FERROUS SULFATE 325 (65 FE) MG PO TABS
325.0000 mg | ORAL_TABLET | Freq: Every day | ORAL | Status: DC
  Administered 2018-12-29 – 2019-01-07 (×10): 325 mg via ORAL
  Filled 2018-12-29 (×10): qty 1

## 2018-12-29 NOTE — Plan of Care (Signed)
  Problem: Clinical Measurements: Goal: Will remain free from infection Outcome: Progressing   Problem: Coping: Goal: Level of anxiety will decrease Outcome: Progressing   Problem: Pain Managment: Goal: General experience of comfort will improve Outcome: Progressing   Problem: Safety: Goal: Ability to remain free from injury will improve Outcome: Progressing   Problem: Skin Integrity: Goal: Risk for impaired skin integrity will decrease Outcome: Progressing   Problem: Clinical Measurements: Goal: Postoperative complications will be avoided or minimized Outcome: Progressing

## 2018-12-29 NOTE — Progress Notes (Signed)
PROGRESS NOTE    Desiree Salinas  [id number] DOB: Mar 03, 1951 DOA: 12/24/2018 PCP: [name], MD    Brief Narrative:  68 y.o. female with medical history significant of dementia, anemia, anxiety, arthritis, COPD, depression, hyperlipidemia, hospital admission 9/22-9/28 for intertrochanteric fracture of right hip status post intramedullary nailing by Dr. Sharol Given on 9/23 presenting from her nursing home with complaints of right hip pain.  Patient is somnolent after receiving fentanyl for pain.  No history could be obtained from her.  Per caregiver at bedside, this morning patient slipped out of her wheelchair while staff at the nursing home were helping her take a bath.  She did not hit her head or lose consciousness.  Patient has been complaining of pain in her right hip.  ED Course: Hemodynamically stable.  White blood cell count 14.9.  Hemoglobin 11.4, stable compared to prior labs.  UA pending.  SARS-CoV-2 test pending.  X-ray of right hip and pelvis showing previous intramedullary rod and screw fixation of comminuted intertrochanteric fracture, now with acute displaced periprosthetic fracture involving the proximal shaft of the femur.  No femoral head dislocation.  Assessment & Plan:   Principal Problem:   Periprosthetic fracture of hip Active Problems:   HYPERCHOLESTEROLEMIA   Dementia with aggressive behavior (HCC)   Leukocytosis   COPD (chronic obstructive pulmonary disease) (HCC)  R hip periprosthetic fracture - X-ray of right hip and pelvis showing previous intramedullary rod and screw fixation of comminuted intertrochanteric fracture, now with acute displaced periprosthetic fracture involving the proximal shaft of the femur.   -No femoral head dislocation.  -Orthopedic Surgery following. Pt now s/p surgery 10/23 -Plan to d/c to SNF. COVID testing pending  Leukocytosis -White blood cell count 14.9, improved. Likely secondary to  following acute fracture -Chest x-ray  reviewed, clear -UA with many bacteria, no UTI related symptoms per caregiver or staff -Resolved  COPD -Remains stable at this time -On minimal O2 support. No wheezing appreciated -Continue Combivent as pt tolerates  Dementia with Aggressive behavior -Have resumed home meds as tolerated -Recent EKG reviewed, QTc noted to be 452 -Continues to be stable at this time  HLD -continued on home statin as tolerated  Hypotension -Overnight events noted. SBP into the 70's -Follow CBC trend and transfuse if hgb<7.0 -BP remains soft this AM. Will hold further BP meds. Anticipate resuming when and if BP allows  Acute blood loss anemia -Iron low at 20. Will start iron supplementation -Hgb slightly increased from 7.7 to 7.9 this AM  DVT prophylaxis: Heparin subQ Code Status: Full Family Communication: Pt in room, family not at bedsie Disposition Plan: SNF when bed available  Consultants:   Orthopedic Surgery  Procedures:     Antimicrobials: Anti-infectives (From admission, onward)   Start     Dose/Rate Route Frequency Ordered Stop   12/26/18 0600  ceFAZolin (ANCEF) IVPB 2g/100 mL premix     2 g 200 mL/hr over 30 Minutes Intravenous On call to O.R. 12/25/18 2136 12/26/18 0938      Subjective: No issues this AM per caregiver, unable to assess given mentation  Objective: Vitals:   12/28/18 1917 12/29/18 0324 12/29/18 0341 12/29/18 0810  BP: (!) 104/54 (!) 85/44 (!) 92/50 (!) 107/57  Pulse: 93 90  92  Resp: 16 15  18   Temp: 99.3 F (37.4 C) 99.8 F (37.7 C)  98.7 F (37.1 C)  TempSrc: Oral Oral  Oral  SpO2: 98% 90%  95%  Weight:      Height:  Intake/Output Summary (Last 24 hours) at 12/29/2018 1245 Last data filed at 12/29/2018 0900 Gross per 24 hour  Intake 240 ml  Output 850 ml  Net -610 ml   Filed Weights   12/24/18 1344 12/27/18 0441  Weight: 54.4 kg 58.9 kg    Examination: General exam: Asleep, in no acute distress Respiratory system: normal  chest rise, clear, no audible wheezing Cardiovascular system: regular rhythm, s1-s2 Gastrointestinal system: Nondistended, nontender, pos BS Central nervous system: No seizures, no tremors Extremities: No cyanosis, no joint deformities Skin: No rashes, no pallor Psychiatry: Unable to assess given current mentation  Data Reviewed: I have personally reviewed following labs and imaging studies  CBC: Recent Labs  Lab 12/24/18 1837 12/25/18 0841 12/26/18 0327 12/27/18 0319 12/28/18 0452 12/29/18 0512  WBC 14.9* 11.4* 11.2* 11.3* 9.6 9.1  NEUTROABS 12.2*  --   --   --  7.2  --   HGB 11.4* 10.3* 9.8* 8.6* 7.7* 7.9*  HCT 36.1 32.8* 30.4* 27.5* 24.6* 24.8*  MCV 95.5 94.5 93.3 95.8 93.9 92.9  PLT 351 365 304 303 302 0000000   Basic Metabolic Panel: Recent Labs  Lab 12/24/18 1837 12/26/18 0327 12/27/18 0319 12/29/18 0512  NA 137 138 139 139  K 4.3 4.1 4.3 3.9  CL 103 106 106 106  CO2 21* 24 22 23   GLUCOSE 115* 123* 113* 112*  BUN 13 15 16 15   CREATININE 0.87 0.69 0.67 0.63  CALCIUM 8.7* 8.4* 8.4* 8.0*   GFR: Estimated Creatinine Clearance: 56.4 mL/min (by C-G formula based on SCr of 0.63 mg/dL). Liver Function Tests: Recent Labs  Lab 12/29/18 0512  AST 43*  ALT 31  ALKPHOS 72  BILITOT 0.6  PROT 5.4*  ALBUMIN 2.5*   No results for input(s): LIPASE, AMYLASE in the last 168 hours. No results for input(s): AMMONIA in the last 168 hours. Coagulation Profile: No results for input(s): INR, PROTIME in the last 168 hours. Cardiac Enzymes: No results for input(s): CKTOTAL, CKMB, CKMBINDEX, TROPONINI in the last 168 hours. BNP (last 3 results) No results for input(s): PROBNP in the last 8760 hours. HbA1C: No results for input(s): HGBA1C in the last 72 hours. CBG: No results for input(s): GLUCAP in the last 168 hours. Lipid Profile: No results for input(s): CHOL, HDL, LDLCALC, TRIG, CHOLHDL, LDLDIRECT in the last 72 hours. Thyroid Function Tests: No results for input(s):  TSH, T4TOTAL, FREET4, T3FREE, THYROIDAB in the last 72 hours. Anemia Panel: Recent Labs    12/29/18 0512  TIBC 176*  IRON 20*   Sepsis Labs: No results for input(s): PROCALCITON, LATICACIDVEN in the last 168 hours.  Recent Results (from the past 240 hour(s))  SARS Coronavirus 2 by RT PCR (hospital order, performed in [facility] hospital lab) Nasopharyngeal Nasopharyngeal Swab     Status: None   Collection Time: 12/24/18  8:44 PM   Specimen: Nasopharyngeal Swab  Result Value Ref Range Status   SARS Coronavirus 2 NEGATIVE NEGATIVE Final    Comment: (NOTE) If result is NEGATIVE SARS-CoV-2 target nucleic acids are NOT DETECTED. The SARS-CoV-2 RNA is generally detectable in upper and lower  respiratory specimens during the acute phase of infection. The lowest  concentration of SARS-CoV-2 viral copies this assay can detect is 250  copies / mL. A negative result does not preclude SARS-CoV-2 infection  and should not be used as the sole basis for treatment or other  patient management decisions.  A negative result may occur with  improper specimen collection /  handling, submission of specimen other  than nasopharyngeal swab, presence of viral mutation(s) within the  areas targeted by this assay, and inadequate number of viral copies  (<250 copies / mL). A negative result must be combined with clinical  observations, patient history, and epidemiological information. If result is POSITIVE SARS-CoV-2 target nucleic acids are DETECTED. The SARS-CoV-2 RNA is generally detectable in upper and lower  respiratory specimens dur ing the acute phase of infection.  Positive  results are indicative of active infection with SARS-CoV-2.  Clinical  correlation with patient history and other diagnostic information is  necessary to determine patient infection status.  Positive results do  not rule out bacterial infection or co-infection with other viruses. If result is PRESUMPTIVE POSTIVE SARS-CoV-2  nucleic acids MAY BE PRESENT.   A presumptive positive result was obtained on the submitted specimen  and confirmed on repeat testing.  While 2019 novel coronavirus  (SARS-CoV-2) nucleic acids may be present in the submitted sample  additional confirmatory testing may be necessary for epidemiological  and / or clinical management purposes  to differentiate between  SARS-CoV-2 and other Sarbecovirus currently known to infect humans.  If clinically indicated additional testing with an alternate test  methodology [phone]) is advised. The SARS-CoV-2 RNA is generally  detectable in upper and lower respiratory sp ecimens during the acute  phase of infection. The expected result is Negative. Fact Sheet for Patients:  StrictlyIdeas.no Fact Sheet for Healthcare Providers: BankingDealers.co.za This test is not yet approved or cleared by the [place] FDA and has been authorized for detection and/or diagnosis of SARS-CoV-2 by FDA under an Emergency Use Authorization (EUA).  This EUA will remain in effect (meaning this test can be used) for the duration of the COVID-19 declaration under Section 564(b)(1) of the Act, 21 U.S.C. section 360bbb-3(b)(1), unless the authorization is terminated or revoked sooner. Performed at [facility], [place] [address], [place], [place] [address]   Surgical pcr screen     Status: None   Collection Time: 12/25/18  3:44 AM   Specimen: Nasal Swab  Result Value Ref Range Status   MRSA, PCR NEGATIVE NEGATIVE Final   Staphylococcus aureus NEGATIVE NEGATIVE Final    Comment: (NOTE) The Xpert SA Assay (FDA approved for NASAL specimens in patients 1 years of age and older), is one component of a comprehensive surveillance program. It is not intended to diagnose infection nor to guide or monitor treatment. Performed at [facility], [place] [address] [place], [place] [address]   SARS Coronavirus 2 by RT PCR  (hospital order, performed in [facility] hospital lab) Nasopharyngeal Nasopharyngeal Swab     Status: None   Collection Time: 12/29/18 10:25 AM   Specimen: Nasopharyngeal Swab  Result Value Ref Range Status   SARS Coronavirus 2 NEGATIVE NEGATIVE Final    Comment: (NOTE) If result is NEGATIVE SARS-CoV-2 target nucleic acids are NOT DETECTED. The SARS-CoV-2 RNA is generally detectable in upper and lower  respiratory specimens during the acute phase of infection. The lowest  concentration of SARS-CoV-2 viral copies this assay can detect is 250  copies / mL. A negative result does not preclude SARS-CoV-2 infection  and should not be used as the sole basis for treatment or other  patient management decisions.  A negative result may occur with  improper specimen collection / handling, submission of specimen other  than nasopharyngeal swab, presence of viral mutation(s) within the  areas targeted by this assay, and inadequate number  of viral copies  (<250 copies / mL). A negative result must be combined with clinical  observations, patient history, and epidemiological information. If result is POSITIVE SARS-CoV-2 target nucleic acids are DETECTED. The SARS-CoV-2 RNA is generally detectable in upper and lower  respiratory specimens dur ing the acute phase of infection.  Positive  results are indicative of active infection with SARS-CoV-2.  Clinical  correlation with patient history and other diagnostic information is  necessary to determine patient infection status.  Positive results do  not rule out bacterial infection or co-infection with other viruses. If result is PRESUMPTIVE POSTIVE SARS-CoV-2 nucleic acids MAY BE PRESENT.   A presumptive positive result was obtained on the submitted specimen  and confirmed on repeat testing.  While 2019 novel coronavirus  (SARS-CoV-2) nucleic acids may be present in the submitted sample  additional confirmatory testing may be necessary for  epidemiological  and / or clinical management purposes  to differentiate between  SARS-CoV-2 and other Sarbecovirus currently known to infect humans.  If clinically indicated additional testing with an alternate test  methodology [phone]) is advised. The SARS-CoV-2 RNA is generally  detectable in upper and lower respiratory sp ecimens during the acute  phase of infection. The expected result is Negative. Fact Sheet for Patients:  StrictlyIdeas.no Fact Sheet for Healthcare Providers: BankingDealers.co.za This test is not yet approved or cleared by the [place] FDA and has been authorized for detection and/or diagnosis of SARS-CoV-2 by FDA under an Emergency Use Authorization (EUA).  This EUA will remain in effect (meaning this test can be used) for the duration of the COVID-19 declaration under Section 564(b)(1) of the Act, 21 U.S.C. section 360bbb-3(b)(1), unless the authorization is terminated or revoked sooner. Performed at [facility], [place] [address], [place], [place] [address]      Radiology Studies: No results found.  Scheduled Meds: . aspirin  325 mg Oral Q breakfast  . atorvastatin  40 mg Oral QHS  . busPIRone  20 mg Oral Daily  . docusate sodium  100 mg Oral BID  . escitalopram  20 mg Oral Daily  . feeding supplement (ENSURE ENLIVE)  237 mL Oral Q1500  . ferrous sulfate  325 mg Oral Q breakfast  . Melatonin  3 mg Oral QHS  . pantoprazole  40 mg Oral Daily  . QUEtiapine  100 mg Oral QHS   Continuous Infusions: . lactated ringers Stopped (12/26/18 1102)  . methocarbamol (ROBAXIN) IV       LOS: 5 days   Marylu Lund, MD [facility] Pager On Amion  If 7PM-7AM, please contact night-coverage 12/29/2018, 12:45 PM

## 2018-12-29 NOTE — Progress Notes (Signed)
Contacted CSW about patient's sister (POA) wanting to speak with them about placement to a SNF.

## 2018-12-30 LAB — CBC
HCT: 29 % — ABNORMAL LOW (ref 36.0–46.0)
Hemoglobin: 9.3 g/dL — ABNORMAL LOW (ref 12.0–15.0)
MCH: 30 pg (ref 26.0–34.0)
MCHC: 32.1 g/dL (ref 30.0–36.0)
MCV: 93.5 fL (ref 80.0–100.0)
Platelets: 333 10*3/uL (ref 150–400)
RBC: 3.1 MIL/uL — ABNORMAL LOW (ref 3.87–5.11)
RDW: 13.2 % (ref 11.5–15.5)
WBC: 10.3 10*3/uL (ref 4.0–10.5)
nRBC: 0 % (ref 0.0–0.2)

## 2018-12-30 LAB — BASIC METABOLIC PANEL
Anion gap: 10 (ref 5–15)
BUN: 13 mg/dL (ref 8–23)
CO2: 25 mmol/L (ref 22–32)
Calcium: 8.4 mg/dL — ABNORMAL LOW (ref 8.9–10.3)
Chloride: 106 mmol/L (ref 98–111)
Creatinine, Ser: 0.57 mg/dL (ref 0.44–1.00)
GFR calc Af Amer: >60 mL/min (ref >60)
GFR calc non Af Amer: >60 mL/min (ref >60)
Glucose, Bld: 110 mg/dL — ABNORMAL HIGH (ref 70–99)
Potassium: 4.2 mmol/L (ref 3.5–5.1)
Sodium: 141 mmol/L (ref 135–145)

## 2018-12-30 NOTE — Progress Notes (Signed)
PROGRESS NOTE    Desiree Salinas  [id number] DOB: [birth year] DOA: 12/24/2018 PCP: [name], MD    Brief Narrative:  68 y.o. female with medical history significant of dementia, anemia, anxiety, arthritis, COPD, depression, hyperlipidemia, hospital admission 9/22-9/28 for intertrochanteric fracture of right hip status post intramedullary nailing by Dr. Sharol Given on 9/23 presenting from her nursing home with complaints of right hip pain.  Patient is somnolent after receiving fentanyl for pain.  No history could be obtained from her.  Per caregiver at bedside, this morning patient slipped out of her wheelchair while staff at the nursing home were helping her take a bath.  She did not hit her head or lose consciousness.  Patient has been complaining of pain in her right hip.  ED Course: Hemodynamically stable.  White blood cell count 14.9.  Hemoglobin 11.4, stable compared to prior labs.  UA pending.  SARS-CoV-2 test pending.  X-ray of right hip and pelvis showing previous intramedullary rod and screw fixation of comminuted intertrochanteric fracture, now with acute displaced periprosthetic fracture involving the proximal shaft of the femur.  No femoral head dislocation.  Assessment & Plan:   Principal Problem:   Periprosthetic fracture of hip Active Problems:   HYPERCHOLESTEROLEMIA   Dementia with aggressive behavior (HCC)   Leukocytosis   COPD (chronic obstructive pulmonary disease) (HCC)  R hip periprosthetic fracture - X-ray of right hip and pelvis showing previous intramedullary rod and screw fixation of comminuted intertrochanteric fracture, now with acute displaced periprosthetic fracture involving the proximal shaft of the femur.   -No femoral head dislocation.  -Orthopedic Surgery following. Pt now s/p surgery 10/23 -Plan to d/c to SNF. COVID test is neg. Discussed with CM. Awaiting response form [facility]  Leukocytosis -Likely secondary to  following acute  fracture -Chest x-ray reviewed, clear -UA with many bacteria, no UTI related symptoms per caregiver or staff -WBC normalized  COPD -Remains stable at this time -On minimal O2 support. No wheezing appreciated -Continue Combivent as tolerated  Dementia with Aggressive behavior -Have resumed home meds as tolerated -Recent EKG reviewed, QTc noted to be 452 -Remains stable at this time  HLD -Pt is continued on home statin as tolerated  Hypotension -Recent hypotension requiring IVF bolus was noted -Held home pindolol with improvement in BP. Consider resuming down the road as BP tolerates  Acute blood loss anemia -Iron low at 20. Will continue iron supplementation -Hgb improved from 7.9 to 9.3 this AM  DVT prophylaxis: Heparin subQ Code Status: Full Family Communication: Pt in room, family not at bedsie Disposition Plan: SNF when bed available  Consultants:   Orthopedic Surgery  Procedures:     Antimicrobials: Anti-infectives (From admission, onward)   Start     Dose/Rate Route Frequency Ordered Stop   12/26/18 0600  ceFAZolin (ANCEF) IVPB 2g/100 mL premix     2 g 200 mL/hr over 30 Minutes Intravenous On call to O.R. 12/25/18 2136 12/26/18 [id number]      Subjective: Baseline confused this AM, refusing breakfast when seen  Objective: Vitals:   12/29/18 1310 12/29/18 1939 12/30/18 0411 12/30/18 0803  BP: 113/69 111/74 107/67 109/72  Pulse: 91 88 90 98  Resp: 18 16 16 16   Temp: 98.9 F (37.2 C) 98.9 F (37.2 C) 99 F (37.2 C) 98.2 F (36.8 C)  TempSrc: Oral Oral Oral Oral  SpO2: 96% 98% 100% 100%  Weight:      Height:        Intake/Output Summary (Last 24  hours) at 12/30/2018 1732 Last data filed at 12/30/2018 1209 Gross per 24 hour  Intake 240 ml  Output 800 ml  Net -560 ml   Filed Weights   12/24/18 1344 12/27/18 0441  Weight: 54.4 kg 58.9 kg    Examination: General exam: Conversant, in no acute distress Respiratory system: normal chest rise,  clear, no audible wheezing Cardiovascular system: regular rhythm, s1-s2 Gastrointestinal system: Nondistended, nontender, pos BS Central nervous system: No seizures, no tremors Extremities: No cyanosis, no joint deformities Skin: No rashes, no pallor Psychiatry: unable to assess given current mentation  Data Reviewed: I have personally reviewed following labs and imaging studies  CBC: Recent Labs  Lab 12/24/18 1837  12/26/18 0327 12/27/18 0319 12/28/18 0452 12/29/18 0512 12/30/18 0328  WBC 14.9*   < > 11.2* 11.3* 9.6 9.1 10.3  NEUTROABS 12.2*  --   --   --  7.2  --   --   HGB 11.4*   < > 9.8* 8.6* 7.7* 7.9* 9.3*  HCT 36.1   < > 30.4* 27.5* 24.6* 24.8* 29.0*  MCV 95.5   < > 93.3 95.8 93.9 92.9 93.5  PLT 351   < > 304 303 302 348 333   < > = values in this interval not displayed.   Basic Metabolic Panel: Recent Labs  Lab 12/24/18 1837 12/26/18 0327 12/27/18 0319 12/29/18 0512 12/30/18 0328  NA 137 138 139 139 141  K 4.3 4.1 4.3 3.9 4.2  CL 103 106 106 106 106  CO2 21* 24 22 23 25   GLUCOSE 115* 123* 113* 112* 110*  BUN 13 15 16 15 13   CREATININE 0.87 0.69 0.67 0.63 0.57  CALCIUM 8.7* 8.4* 8.4* 8.0* 8.4*   GFR: Estimated Creatinine Clearance: 56.4 mL/min (by C-G formula based on SCr of 0.57 mg/dL). Liver Function Tests: Recent Labs  Lab 12/29/18 0512  AST 43*  ALT 31  ALKPHOS 72  BILITOT 0.6  PROT 5.4*  ALBUMIN 2.5*   No results for input(s): LIPASE, AMYLASE in the last 168 hours. No results for input(s): AMMONIA in the last 168 hours. Coagulation Profile: No results for input(s): INR, PROTIME in the last 168 hours. Cardiac Enzymes: No results for input(s): CKTOTAL, CKMB, CKMBINDEX, TROPONINI in the last 168 hours. BNP (last 3 results) No results for input(s): PROBNP in the last 8760 hours. HbA1C: No results for input(s): HGBA1C in the last 72 hours. CBG: No results for input(s): GLUCAP in the last 168 hours. Lipid Profile: No results for input(s):  CHOL, HDL, LDLCALC, TRIG, CHOLHDL, LDLDIRECT in the last 72 hours. Thyroid Function Tests: No results for input(s): TSH, T4TOTAL, FREET4, T3FREE, THYROIDAB in the last 72 hours. Anemia Panel: Recent Labs    12/29/18 0512  TIBC 176*  IRON 20*   Sepsis Labs: No results for input(s): PROCALCITON, LATICACIDVEN in the last 168 hours.  Recent Results (from the past 240 hour(s))  SARS Coronavirus 2 by RT PCR (hospital order, performed in [facility] hospital lab) Nasopharyngeal Nasopharyngeal Swab     Status: None   Collection Time: 12/24/18  8:44 PM   Specimen: Nasopharyngeal Swab  Result Value Ref Range Status   SARS Coronavirus 2 NEGATIVE NEGATIVE Final    Comment: (NOTE) If result is NEGATIVE SARS-CoV-2 target nucleic acids are NOT DETECTED. The SARS-CoV-2 RNA is generally detectable in upper and lower  respiratory specimens during the acute phase of infection. The lowest  concentration of SARS-CoV-2 viral copies this assay can detect is 250  copies / mL. A negative result does not preclude SARS-CoV-2 infection  and should not be used as the sole basis for treatment or other  patient management decisions.  A negative result may occur with  improper specimen collection / handling, submission of specimen other  than nasopharyngeal swab, presence of viral mutation(s) within the  areas targeted by this assay, and inadequate number of viral copies  (<250 copies / mL). A negative result must be combined with clinical  observations, patient history, and epidemiological information. If result is POSITIVE SARS-CoV-2 target nucleic acids are DETECTED. The SARS-CoV-2 RNA is generally detectable in upper and lower  respiratory specimens dur ing the acute phase of infection.  Positive  results are indicative of active infection with SARS-CoV-2.  Clinical  correlation with patient history and other diagnostic information is  necessary to determine patient infection status.  Positive results do   not rule out bacterial infection or co-infection with other viruses. If result is PRESUMPTIVE POSTIVE SARS-CoV-2 nucleic acids MAY BE PRESENT.   A presumptive positive result was obtained on the submitted specimen  and confirmed on repeat testing.  While 2019 novel coronavirus  (SARS-CoV-2) nucleic acids may be present in the submitted sample  additional confirmatory testing may be necessary for epidemiological  and / or clinical management purposes  to differentiate between  SARS-CoV-2 and other Sarbecovirus currently known to infect humans.  If clinically indicated additional testing with an alternate test  methodology [phone]) is advised. The SARS-CoV-2 RNA is generally  detectable in upper and lower respiratory sp ecimens during the acute  phase of infection. The expected result is Negative. Fact Sheet for Patients:  StrictlyIdeas.no Fact Sheet for Healthcare Providers: BankingDealers.co.za This test is not yet approved or cleared by the [place] FDA and has been authorized for detection and/or diagnosis of SARS-CoV-2 by FDA under an Emergency Use Authorization (EUA).  This EUA will remain in effect (meaning this test can be used) for the duration of the COVID-19 declaration under Section 564(b)(1) of the Act, 21 U.S.C. section 360bbb-3(b)(1), unless the authorization is terminated or revoked sooner. Performed at [facility], [place] [address], [place], [place] [address]   Surgical pcr screen     Status: None   Collection Time: 12/25/18  3:44 AM   Specimen: Nasal Swab  Result Value Ref Range Status   MRSA, PCR NEGATIVE NEGATIVE Final   Staphylococcus aureus NEGATIVE NEGATIVE Final    Comment: (NOTE) The Xpert SA Assay (FDA approved for NASAL specimens in patients 5 years of age and older), is one component of a comprehensive surveillance program. It is not intended to diagnose infection nor to guide or monitor  treatment. Performed at [facility], [place] [address], [place], [place] [address]   SARS Coronavirus 2 by RT PCR (hospital order, performed in [facility] hospital lab) Nasopharyngeal Nasopharyngeal Swab     Status: None   Collection Time: 12/29/18 10:25 AM   Specimen: Nasopharyngeal Swab  Result Value Ref Range Status   SARS Coronavirus 2 NEGATIVE NEGATIVE Final    Comment: (NOTE) If result is NEGATIVE SARS-CoV-2 target nucleic acids are NOT DETECTED. The SARS-CoV-2 RNA is generally detectable in upper and lower  respiratory specimens during the acute phase of infection. The lowest  concentration of SARS-CoV-2 viral copies this assay can detect is 250  copies / mL. A negative result does not preclude SARS-CoV-2 infection  and should not be used as the sole basis for treatment or  other  patient management decisions.  A negative result may occur with  improper specimen collection / handling, submission of specimen other  than nasopharyngeal swab, presence of viral mutation(s) within the  areas targeted by this assay, and inadequate number of viral copies  (<250 copies / mL). A negative result must be combined with clinical  observations, patient history, and epidemiological information. If result is POSITIVE SARS-CoV-2 target nucleic acids are DETECTED. The SARS-CoV-2 RNA is generally detectable in upper and lower  respiratory specimens dur ing the acute phase of infection.  Positive  results are indicative of active infection with SARS-CoV-2.  Clinical  correlation with patient history and other diagnostic information is  necessary to determine patient infection status.  Positive results do  not rule out bacterial infection or co-infection with other viruses. If result is PRESUMPTIVE POSTIVE SARS-CoV-2 nucleic acids MAY BE PRESENT.   A presumptive positive result was obtained on the submitted specimen  and confirmed on repeat testing.  While 2019 novel coronavirus   (SARS-CoV-2) nucleic acids may be present in the submitted sample  additional confirmatory testing may be necessary for epidemiological  and / or clinical management purposes  to differentiate between  SARS-CoV-2 and other Sarbecovirus currently known to infect humans.  If clinically indicated additional testing with an alternate test  methodology [phone]) is advised. The SARS-CoV-2 RNA is generally  detectable in upper and lower respiratory sp ecimens during the acute  phase of infection. The expected result is Negative. Fact Sheet for Patients:  StrictlyIdeas.no Fact Sheet for Healthcare Providers: BankingDealers.co.za This test is not yet approved or cleared by the [place] FDA and has been authorized for detection and/or diagnosis of SARS-CoV-2 by FDA under an Emergency Use Authorization (EUA).  This EUA will remain in effect (meaning this test can be used) for the duration of the COVID-19 declaration under Section 564(b)(1) of the Act, 21 U.S.C. section 360bbb-3(b)(1), unless the authorization is terminated or revoked sooner. Performed at [facility], [place] [address] [place], [place] [address]      Radiology Studies: No results found.  Scheduled Meds:  aspirin  325 mg Oral Q breakfast   atorvastatin  40 mg Oral QHS   busPIRone  20 mg Oral Daily   docusate sodium  100 mg Oral BID   escitalopram  20 mg Oral Daily   feeding supplement (ENSURE ENLIVE)  237 mL Oral Q1500   ferrous sulfate  325 mg Oral Q breakfast   Melatonin  3 mg Oral QHS   pantoprazole  40 mg Oral Daily   QUEtiapine  100 mg Oral QHS   Continuous Infusions:  lactated ringers Stopped (12/26/18 1102)   methocarbamol (ROBAXIN) IV       LOS: 6 days   Marylu Lund, MD [facility] Pager On Amion  If 7PM-7AM, please contact night-coverage 12/30/2018, 5:32 PM

## 2018-12-30 NOTE — Plan of Care (Signed)
  Problem: Nutrition: Goal: Adequate nutrition will be maintained Outcome: Progressing   Problem: Coping: Goal: Level of anxiety will decrease Outcome: Progressing   Problem: Pain Managment: Goal: General experience of comfort will improve Outcome: Progressing   Problem: Safety: Goal: Ability to remain free from injury will improve Outcome: Progressing   Problem: Skin Integrity: Goal: Risk for impaired skin integrity will decrease Outcome: Progressing   Problem: Pain Management: Goal: Pain level will decrease Outcome: Progressing

## 2018-12-30 NOTE — Plan of Care (Signed)
  Problem: Education: Goal: Knowledge of General Education information will improve Description Including pain rating scale, medication(s)/side effects and non-pharmacologic comfort measures Outcome: Progressing   

## 2018-12-30 NOTE — Consult Note (Signed)
   [facility] CM Inpatient Consult   12/30/2018  Desiree Salinas 1950-08-24 [id number]   Patient screened for high risk score for unplanned readmission score of 28% and length of stay to check if potential [place] [facility] services are needed in the Medicare [place].    Review of patient's medical record reveals patient is being recommended for a skilled nursing facility stay.  Patient is post hardware removal Right hip and patient with advanced dementia required care around the clock per PT notes.  Primary Care Provider is  [name], MD  Plan:  Following for disposition, likely [facility] per PT notes as reviewed.   Please place a [facility] Care Management consult as appropriate and for questions contact:   Natividad Brood, RN BSN [facility] Liaison  [phone] business mobile phone Toll free office [phone]  Fax number: [phone] [email or web address]

## 2018-12-30 NOTE — TOC Initial Note (Signed)
Transition of Care [facility]) - Initial/Assessment Note    Patient Details  Name: Desiree Salinas MRN: [id number] Date of Birth: Jul 17, 1950  Transition of [facility]) CM/SW Contact:    Vinie Sill, [place] Phone Number: 12/30/2018, 5:31 PM  Clinical Narrative:                  CSW spoke with the patient's sister, Dwain Sarna), she confirmed the patient is from [facility] ALF/ Memory Unit. She confirmed the patient will return to ALF once medically stable. [name] inquired about DME order for wheel chair and abduction pillow. CSW advised wheel chair order was in and patient has abduction pillow. She states no other question or concerns at this time. CSW will follo up with [facility] to determine if they will order wheel chair.  CSW called [facility] main number- informed CSW needs  to speak with Gilmore Laroche tomorrow at [phone]. They will need to review FL2, medication and  discharge summary prior to d/c to ALF.   CSW called- left voice mail for Jimmie Molly to return call.  PASRR level II pending   Expected Discharge Plan: Assisted Living Barriers to Discharge: SNF Pending discharge summary, SNF Pending discharge orders   Patient Goals and CMS Choice     Choice offered to / list presented to : [facility] POA / Guardian  Expected Discharge Plan and Services Expected Discharge Plan: Assisted Living In-house Referral: Clinical Social Work     Living arrangements for the past 2 months: [place]                                      Prior Living Arrangements/Services Living arrangements for the past 2 months: [place] Lives with:: Self Patient language and need for interpreter reviewed:: No Do you feel safe going back to the place where you live?: Yes      Need for Family Participation in Patient Care: Yes (Comment) Care giver support system in place?: Yes (comment)   Criminal Activity/Legal Involvement Pertinent to Current  Situation/Hospitalization: No - Comment as needed  Activities of Daily Living Home Assistive Devices/Equipment: Environmental consultant (specify type), Wheelchair ADL Screening (condition at time of admission) Patient's cognitive ability adequate to safely complete daily activities?: No Is the patient deaf or have difficulty hearing?: No Does the patient have difficulty seeing, even when wearing glasses/contacts?: No Does the patient have difficulty concentrating, remembering, or making decisions?: Yes Patient able to express need for assistance with ADLs?: Yes Does the patient have difficulty dressing or bathing?: Yes Independently performs ADLs?: No Communication: Independent Dressing (OT): Needs assistance Is this a change from baseline?: Pre-admission baseline Grooming: Needs assistance Is this a change from baseline?: Pre-admission baseline Feeding: Needs assistance Is this a change from baseline?: Pre-admission baseline Bathing: Needs assistance Is this a change from baseline?: Pre-admission baseline Toileting: Needs assistance Is this a change from baseline?: Pre-admission baseline In/Out Bed: Needs assistance Is this a change from baseline?: Pre-admission baseline Walks in Home: Needs assistance Is this a change from baseline?: Pre-admission baseline Does the patient have difficulty walking or climbing stairs?: Yes Weakness of Legs: Right Weakness of Arms/Hands: None  Permission Sought/Granted Permission sought to share information with : Family Supports Permission granted to share information with : Yes, Verbal Permission Granted  Share Information with NAME: [place])  Permission granted to share info w AGENCY: [facility]  ALF  Permission granted to share info w Relationship: POA  Permission granted to share info w Contact Information: [phone]  Emotional Assessment       Orientation: : Oriented to Self Alcohol / Substance Use: Not Applicable Psych Involvement:  No (comment)  Admission diagnosis:  Leukocytosis [D72.829] Periprosthetic fracture around internal prosthetic right hip joint, initial encounter ([place]) [id number]] Fall, initial encounter [id number]] Patient Active Problem List   Diagnosis Date Noted  . Periprosthetic fracture of hip 12/24/2018  . Hip fracture ([place]) 11/26/2018  . Fall at home, initial encounter 11/26/2018  . Leukocytosis 11/26/2018  . COPD (chronic obstructive pulmonary disease) ([place]) 11/26/2018  . Intertrochanteric fracture of right hip ([place]) 11/26/2018  . Closed intertrochanteric fracture of hip, right, initial encounter ([place])   . Dementia with aggressive behavior ([place]) 11/08/2018  . Iron deficiency anemia 07/15/2018  . Fall 06/09/2018  . 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 Degenerative scoliosis 02/10/2018  . Diarrhea 10/14/2013  . Cigarette smoker 08/08/2011  . VAIN I (vaginal intraepithelial neoplasia grade I)   . COLONIC POLYPS 12/21/2007  . ALLERGIC RHINITIS 12/21/2007  . 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 HYPERCHOLESTEROLEMIA 07/05/2007  . 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 DERMATITIS 07/05/2007  . SHINGLES, HX OF 07/05/2007   PCP:  [name], MD Pharmacy:   [facility] [place], [place] [address] [place] [address] Phone: [phone] Fax: [phone]  [place], [place] [place]. [place] [address] Phone: [phone] Fax: [phone]     Social Determinants of Health (SDOH) Interventions    Readmission Risk Interventions Readmission Risk Prevention Plan 11/28/2018  Transportation Screening Complete  PCP or Specialist Appt within 3-5 Days Not Complete  Not Complete comments Transitioning to [place] [facility]  [place] or Home Care Consult Complete  Social Work Consult for [place] Planning/Counseling Complete  Palliative Care Screening  Not Applicable  Medication Review Press photographer) Complete  Some recent data might be hidden

## 2018-12-30 NOTE — Care Management Important Message (Signed)
Important Message  Patient Details  Name: Desiree Salinas MRN: [id number] Date of Birth: 1950/10/27   Medicare Important Message Given:  Yes     [name] 12/30/2018, 2:00 PM

## 2018-12-30 NOTE — Progress Notes (Signed)
Orthopedic Tech Progress Note Patient Details:  Desiree Salinas [birth year] [id number]  Ortho Devices Type of Ortho Device: Abduction pillow Ortho Device/Splint Location: Hip ABD pillow Ortho Device/Splint Interventions: Application   Post Interventions Patient Tolerated: Well Instructions Provided: Care of device   [place] Pink 12/30/2018, 12:37 PM

## 2018-12-30 NOTE — Progress Notes (Signed)
Physical Therapy Treatment Patient Details Name: Desiree Salinas MRN: [id number] DOB: [birth year] Today's Date: 12/30/2018    History of Present Illness Desiree Salinas is a 68 y.o. female who presents 12/24/18 with right periprosthetic femur fracture.  Patient has advanced dementia and has around-the-clock care she fell from her wheelchair.  She is a month out from intramedullary fixation for right intertrochanteric hip fracture. Now s/p R femur hardware removal 10/23. PMH including but not limited to dementia, COPD and substance abuse.   PT Comments    Pt progressing with mobility. Pt able to perform prolonged sitting EOB with min guard to maxA for core control; transfer to recliner with maxA+2. Pt very guarded with pain, able to tolerate minimal RLE PROM. Caregiver Desiree Salinas) present and supportive. Continue to recommend return to SNF.   Follow Up Recommendations  SNF;Supervision/Assistance - 24 hour     Equipment Recommendations  None recommended by PT    Recommendations for Other Services       Precautions / Restrictions Precautions Precautions: Fall Precaution Comments: Advanced dementia Restrictions Weight Bearing Restrictions: Yes RLE Weight Bearing: Weight bearing as tolerated    Mobility  Bed Mobility Overal bed mobility: Needs Assistance Bed Mobility: Supine to Sit     Supine to sit: Max assist;HOB elevated     General bed mobility comments: MaxA to manage BLEs and assist trunk elevation, pt assisting minimally with UE support on therapist  Transfers Overall transfer level: Needs assistance Equipment used: 1 person hand held assist Transfers: Squat Pivot Transfers     Squat pivot transfers: Max assist;+2 physical assistance     General transfer comment: Squat pivot to recliner with maxA+2 (caregiver present to provide +2 assist), pt c/o RLE pain, attempting to assist with UE support on therapist  Ambulation/Gait                 Stairs              Wheelchair Mobility    Modified Rankin (Stroke Patients Only)       Balance     Sitting balance-Leahy Scale: Poor Sitting balance - Comments: Able to sit EOB with min guard to maxA, L lateral lean likely due to R hip pain, able to correct somewhat with therapist sitting on R-side providing UE and trunk support; pt automatically engaging trunk more when caregiver began to brush hair     Standing balance-Leahy Scale: Zero                              Cognition Arousal/Alertness: Awake/alert Behavior During Therapy: Anxious Overall Cognitive Status: History of cognitive impairments - at baseline                                 General Comments: pt pleasantly confused throughout; requiring multimodal constant cueing to participate and complete task. Eyes closed majority of session; responding more so to caregiver's Desiree Salinas) commands than PT's      Exercises Other Exercises Other Exercises: Gentle passive R hip flex/abd, knee flex/ext    General Comments General comments (skin integrity, edema, etc.): Caregiver Desiree Salinas) present and supportive, retired Chartered certified accountant; pt seems to take instruction better from her      Pertinent Vitals/Pain Pain Assessment: Faces Faces Pain Scale: Hurts whole lot Pain Location: R LE during mobility Pain Descriptors / Indicators: Grimacing;Guarding;Moaning Pain Intervention(s): Monitored during session;Repositioned  Home Living                      Prior Function            PT Goals (current goals can now be found in the care plan section) Progress towards PT goals: Progressing toward goals    Frequency    Min 2X/week      PT Plan Current plan remains appropriate    Co-evaluation              AM-PAC PT "6 Clicks" Mobility   Outcome Measure  Help needed turning from your back to your side while in a flat bed without using bedrails?: A Lot Help needed moving from lying on your back to  sitting on the side of a flat bed without using bedrails?: A Lot Help needed moving to and from a bed to a chair (including a wheelchair)?: A Lot Help needed standing up from a chair using your arms (e.g., wheelchair or bedside chair)?: Total Help needed to walk in hospital room?: Total Help needed climbing 3-5 steps with a railing? : Total 6 Click Score: 9    End of Session   Activity Tolerance: Patient limited by pain Patient left: in chair;with call bell/phone within reach;with chair alarm set;with family/visitor present Nurse Communication: Mobility status;Need for lift equipment PT Visit Diagnosis: Other abnormalities of gait and mobility (R26.89);Pain Pain - Right/Left: Right Pain - part of body: Hip     Time: [id number] PT Time Calculation (min) (ACUTE ONLY): 26 min  Charges:  $Therapeutic Activity: 23-37 mins                    Desiree Salinas, PT, DPT Acute Rehabilitation Services  Pager [phone] Office [phone]  Desiree Salinas 12/30/2018, 5:26 PM

## 2018-12-31 DIAGNOSIS — D72823 Leukemoid reaction: Secondary | ICD-10-CM

## 2018-12-31 DIAGNOSIS — M978XXD Periprosthetic fracture around other internal prosthetic joint, subsequent encounter: Secondary | ICD-10-CM

## 2018-12-31 LAB — CBC
HCT: 27.2 % — ABNORMAL LOW (ref 36.0–46.0)
Hemoglobin: 8.6 g/dL — ABNORMAL LOW (ref 12.0–15.0)
MCH: 29.5 pg (ref 26.0–34.0)
MCHC: 31.6 g/dL (ref 30.0–36.0)
MCV: 93.2 fL (ref 80.0–100.0)
Platelets: 437 10*3/uL — ABNORMAL HIGH (ref 150–400)
RBC: 2.92 MIL/uL — ABNORMAL LOW (ref 3.87–5.11)
RDW: 13.2 % (ref 11.5–15.5)
WBC: 10.7 10*3/uL — ABNORMAL HIGH (ref 4.0–10.5)
nRBC: 0 % (ref 0.0–0.2)

## 2018-12-31 LAB — COMPREHENSIVE METABOLIC PANEL
ALT: 34 U/L (ref 0–44)
AST: 26 U/L (ref 15–41)
Albumin: 2.7 g/dL — ABNORMAL LOW (ref 3.5–5.0)
Alkaline Phosphatase: 89 U/L (ref 38–126)
Anion gap: 11 (ref 5–15)
BUN: 15 mg/dL (ref 8–23)
CO2: 23 mmol/L (ref 22–32)
Calcium: 8.4 mg/dL — ABNORMAL LOW (ref 8.9–10.3)
Chloride: 103 mmol/L (ref 98–111)
Creatinine, Ser: 0.75 mg/dL (ref 0.44–1.00)
GFR calc Af Amer: >60 mL/min (ref >60)
GFR calc non Af Amer: >60 mL/min (ref >60)
Glucose, Bld: 129 mg/dL — ABNORMAL HIGH (ref 70–99)
Potassium: 4.1 mmol/L (ref 3.5–5.1)
Sodium: 137 mmol/L (ref 135–145)
Total Bilirubin: 1.1 mg/dL (ref 0.3–1.2)
Total Protein: 5.6 g/dL — ABNORMAL LOW (ref 6.5–8.1)

## 2018-12-31 MED ORDER — SODIUM CHLORIDE 0.9 % IV SOLN
510.0000 mg | Freq: Once | INTRAVENOUS | Status: AC
  Administered 2018-12-31: 510 mg via INTRAVENOUS
  Filled 2018-12-31: qty 17

## 2018-12-31 NOTE — Plan of Care (Signed)
  Problem: Pain Managment: Goal: General experience of comfort will improve Outcome: Progressing   Problem: Safety: Goal: Ability to remain free from injury will improve Outcome: Progressing   Problem: Coping: Goal: Level of anxiety will decrease Outcome: Progressing   Problem: Nutrition: Goal: Adequate nutrition will be maintained Outcome: Progressing   

## 2018-12-31 NOTE — TOC Progression Note (Signed)
Transition of Care [facility]) - Progression Note    Patient Details  Name: Desiree Salinas MRN: [id number] Date of Birth: 12-02-1950  Transition of Care [facility]) CM/SW [place], [place] Phone Number: 12/31/2018, 1:38 PM  Clinical Narrative:     CSW called [facility]- left voice message to return call.  Thurmond Butts, MSW, [facility] Clinical Social Worker [phone]   Expected Discharge Plan: Assisted Living Barriers to Discharge: SNF Pending discharge summary, SNF Pending discharge orders  Expected Discharge Plan and Services Expected Discharge Plan: Assisted Living In-house Referral: Clinical Social Work     Living arrangements for the past 2 months: [place]                                       Social Determinants of Health (SDOH) Interventions    Readmission Risk Interventions Readmission Risk Prevention Plan 11/28/2018  Transportation Screening Complete  PCP or Specialist Appt within 3-5 Days Not Complete  Not Complete comments Transitioning to [place] [facility]  [place] or Home Care Consult Complete  Social Work Consult for [place] Planning/Counseling Complete  Palliative Care Screening Not Applicable  Medication Review Press photographer) Complete  Some recent data might be hidden

## 2018-12-31 NOTE — Plan of Care (Signed)
  Problem: Pain Management: Goal: Pain level will decrease Outcome: Progressing   Problem: Safety: Goal: Ability to remain free from injury will improve Outcome: Progressing   Problem: Clinical Measurements: Goal: Ability to maintain clinical measurements within normal limits will improve Outcome: Progressing

## 2018-12-31 NOTE — Progress Notes (Signed)
POD #5 S/P removel of deep hardware placement of long intramedullary Rod  For periprosthetic fracture. Appears comfortable, sleeping but easily awakened. Vital signs stable Hgb 8.7. Dressing Clean Dry and intact. Distal DP pulse intact foot warm. Compartments soft Continue mobilization . Plan for discharge to skilled nursing

## 2019-01-01 LAB — CBC WITH DIFFERENTIAL/PLATELET
Abs Immature Granulocytes: 0.06 10*3/uL (ref 0.00–0.07)
Basophils Absolute: 0.1 10*3/uL (ref 0.0–0.1)
Basophils Relative: 1 %
Eosinophils Absolute: 0.6 10*3/uL — ABNORMAL HIGH (ref 0.0–0.5)
Eosinophils Relative: 7 %
HCT: 25.3 % — ABNORMAL LOW (ref 36.0–46.0)
Hemoglobin: 8 g/dL — ABNORMAL LOW (ref 12.0–15.0)
Immature Granulocytes: 1 %
Lymphocytes Relative: 19 %
Lymphs Abs: 1.7 10*3/uL (ref 0.7–4.0)
MCH: 29.4 pg (ref 26.0–34.0)
MCHC: 31.6 g/dL (ref 30.0–36.0)
MCV: 93 fL (ref 80.0–100.0)
Monocytes Absolute: 0.8 10*3/uL (ref 0.1–1.0)
Monocytes Relative: 9 %
Neutro Abs: 5.7 10*3/uL (ref 1.7–7.7)
Neutrophils Relative %: 63 %
Platelets: 423 10*3/uL — ABNORMAL HIGH (ref 150–400)
RBC: 2.72 MIL/uL — ABNORMAL LOW (ref 3.87–5.11)
RDW: 13.3 % (ref 11.5–15.5)
WBC: 8.9 10*3/uL (ref 4.0–10.5)
nRBC: 0 % (ref 0.0–0.2)

## 2019-01-01 LAB — BASIC METABOLIC PANEL
Anion gap: 10 (ref 5–15)
BUN: 13 mg/dL (ref 8–23)
CO2: 22 mmol/L (ref 22–32)
Calcium: 8.1 mg/dL — ABNORMAL LOW (ref 8.9–10.3)
Chloride: 105 mmol/L (ref 98–111)
Creatinine, Ser: 0.63 mg/dL (ref 0.44–1.00)
GFR calc Af Amer: >60 mL/min (ref >60)
GFR calc non Af Amer: >60 mL/min (ref >60)
Glucose, Bld: 90 mg/dL (ref 70–99)
Potassium: 3.5 mmol/L (ref 3.5–5.1)
Sodium: 137 mmol/L (ref 135–145)

## 2019-01-01 MED ORDER — ENSURE ENLIVE PO LIQD
237.0000 mL | Freq: Two times a day (BID) | ORAL | Status: DC
  Administered 2019-01-02 – 2019-01-07 (×9): 237 mL via ORAL

## 2019-01-01 NOTE — Progress Notes (Signed)
Nutrition Follow-up  DOCUMENTATION CODES:   Not applicable  INTERVENTION:  Provide Ensure Enlive po BID, each supplement provides 350 kcal and 20 grams of protein.  Encourage adequate PO intake.   NUTRITION DIAGNOSIS:   Increased nutrient needs related to post-op healing as evidenced by estimated needs; ongoing  GOAL:   Patient will meet greater than or equal to 90% of their needs; progressing  MONITOR:   PO intake, Supplement acceptance, Labs, I & O's, Skin, Weight trends  REASON FOR ASSESSMENT:   Malnutrition Screening Tool    ASSESSMENT:   68 y.o. female with medical history significant of dementia, anemia, anxiety, arthritis, COPD, depression, hyperlipidemia, hospital admission 9/22-9/28 for intertrochanteric fracture of right hip status post intramedullary nailing by Dr. Sharol Given on 9/23 presenting from her nursing home with complaints of right hip pain s/p fall. X-ray of right hip and pelvis showing previous intramedullary rod and screw fixation of comminuted intertrochanteric fracture, now with acute displaced periprosthetic fracture involving the proximal shaft of the femur.  PROCEDURE (10/23):  REMOVE NAIL RIGHT FEMUR, INTRAMEDULLARY LONG INTERTROCHANTRIC NAIL   Meal completion has been 50-100%. Pt currently has Ensure ordered and has been consuming them. RD to increase Ensure to TID to aid in increased caloric and protein needs. Pt encouraged to eat her food at meals and to drink her supplements.   Labs and medications reviewed.   Diet Order:   Diet Order            Diet Heart Room service appropriate? Yes; Fluid consistency: Thin  Diet effective now              EDUCATION NEEDS:   Not appropriate for education at this time  Skin:  Skin Assessment: Skin Integrity Issues: Skin Integrity Issues:: Incisions Incisions: R hip  Last BM:  10/28  Height:   Ht Readings from Last 1 Encounters:  12/24/18 5\' 3"  (1.6 m)    Weight:   Wt Readings from Last 1  Encounters:  12/27/18 58.9 kg    Ideal Body Weight:  52.27 kg  BMI:  Body mass index is 23 kg/m.  Estimated Nutritional Needs:   Kcal:  1650-1850  Protein:  70-85 grams  Fluid:  1.6 - 1.8 L/day    Corrin Parker, MS, RD, LDN Pager # [phone] After hours/ weekend pager # [phone]

## 2019-01-01 NOTE — Plan of Care (Signed)
  Problem: Education: Goal: Knowledge of General Education information will improve Description: Including pain rating scale, medication(s)/side effects and non-pharmacologic comfort measures Outcome: Progressing   Problem: Health Behavior/Discharge Planning: Goal: Ability to manage health-related needs will improve Outcome: Progressing   Problem: Clinical Measurements: Goal: Ability to maintain clinical measurements within normal limits will improve Outcome: Progressing Goal: Will remain free from infection Outcome: Progressing Goal: Diagnostic test results will improve Outcome: Progressing   Problem: Nutrition: Goal: Adequate nutrition will be maintained Outcome: Progressing   Problem: Coping: Goal: Level of anxiety will decrease Outcome: Progressing   Problem: Elimination: Goal: Will not experience complications related to bowel motility Outcome: Progressing Goal: Will not experience complications related to urinary retention Outcome: Progressing   Problem: Pain Managment: Goal: General experience of comfort will improve Outcome: Progressing   Problem: Safety: Goal: Ability to remain free from injury will improve Outcome: Progressing   Problem: Skin Integrity: Goal: Risk for impaired skin integrity will decrease Outcome: Progressing   Problem: Clinical Measurements: Goal: Postoperative complications will be avoided or minimized Outcome: Progressing   Problem: Pain Management: Goal: Pain level will decrease Outcome: Progressing

## 2019-01-01 NOTE — Progress Notes (Signed)
Desiree Salinas [id number] DOB: 13-Jan-1951 DOA: 12/24/2018 PCP: [name], MD  Brief History   68 y.o.femalewith medical history significant ofdementia, anemia, anxiety, arthritis, COPD, depression, hyperlipidemia, hospital admission 9/22-9/28 for intertrochanteric fracture of right hip status post intramedullary nailing by Dr. Sharol Given on 9/23 presenting from her nursing home with complaints of right hip pain.Patient is somnolent after receiving fentanyl for pain. No history could be obtained from her. Per caregiver at bedside, this morning patient slipped out of her wheelchair while staff at the nursing home were helping her take a bath. She did not hit her head or lose consciousness. Patient has been complaining of pain in her right hip.  ED Course:Hemodynamically stable. White blood cell count 14.9. Hemoglobin 11.4, stable compared to prior labs. UA pending. SARS-CoV-2 test pending. X-ray of right hip and pelvis showing previous intramedullary rod and screw fixation of comminuted intertrochanteric fracture, now with acute displaced periprosthetic fracture involving the proximal shaft of the femur. No femoral head dislocation. Pt underwent ORIf of the right hip on 12/26/2018. She tolerated the procedure well.  Consultants  . Orthopedic surgery  Procedures  . Intramedullary nailing of the right hip  Antibiotics   Anti-infectives (From admission, onward)   Start     Dose/Rate Route Frequency Ordered Stop   12/26/18 0600  ceFAZolin (ANCEF) IVPB 2g/100 mL premix     2 g 200 mL/hr over 30 Minutes Intravenous On call to O.R. 12/25/18 2136 12/26/18 [id number]     Subjective  The patient is awake and alert. She is calm and compliant with exam.  Objective   Vitals:  Vitals:   01/01/19 0353 01/01/19 0817  BP: (!) 108/59 (!) 101/52  Pulse: 92 88  Resp: 17 14  Temp: 98.2 F (36.8 C) 98.6 F (37 C)  SpO2: 93% 95%    Exam:  Constitutional:  . The  patient is awake and alert. No acute distress. Respiratory:  . No increased work of breathing. . No wheezes, rales, or rhonchi . No tactile fremitus Cardiovascular:  . Regular rate and rhythm . No murmurs, ectopy, or gallups. . No lateral PMI. No thrills. Abdomen:  . Abdomen is soft, non-tender, non-distended . No hernias, masses, or organomegaly . Normoactive bowel sounds.  Musculoskeletal:  . No cyanosis, clubbing, or edema Skin:  . No rashes, lesions, ulcers . palpation of skin: no induration or nodules Neurologic:  . CN 2-12 intact . Sensation all 4 extremities intact  I have personally reviewed the following:   Today's Data  . BMP, CBC, Vitals   Scheduled Meds: . aspirin  325 mg Oral Q breakfast  . atorvastatin  40 mg Oral QHS  . busPIRone  20 mg Oral Daily  . docusate sodium  100 mg Oral BID  . escitalopram  20 mg Oral Daily  . feeding supplement (ENSURE ENLIVE)  237 mL Oral BID BM  . ferrous sulfate  325 mg Oral Q breakfast  . Melatonin  3 mg Oral QHS  . pantoprazole  40 mg Oral Daily  . QUEtiapine  100 mg Oral QHS   Continuous Infusions: . lactated ringers Stopped (12/26/18 1102)  . methocarbamol (ROBAXIN) IV      Principal Problem:   Periprosthetic fracture of hip Active Problems:   HYPERCHOLESTEROLEMIA   Dementia with aggressive behavior (HCC)   Leukocytosis   COPD (chronic obstructive pulmonary disease) (HCC)   LOS: 8 days   A & P   R hip periprosthetic fracture: ORIF on 12/26/2018.  Patient tolerated well. Management as per orthopedic surgery. Plan to d/c to SNF. COVID test is neg. Discussed with CM. Awaiting response form [facility]  Leukocytosis: Resolving. Reactive and due to acute fracture.  COPD: Noted and stable. Continue Combivent as tolerated. The patient is saturating at 95% on room air.  Dementia with Aggressive behavior: Have resumed home meds as tolerated. She remains stable at this time.  Hyperlipidemai: Continue statin  as at home.  Hypotension: Recent hypotension requiring IVF bolus was noted. Now low normotensive.  Held home pindolol with improvement in BP. Consider resuming down the road as BP tolerates.  Acute blood loss anemia in the setting of iron deficiency anemia: pt has received IV iron supplementation. Hgb dropped a little lower. Monitor and transfuse for hemoglobin less than 7.0.  I have seen and examined this patient myself. I have spent 30 minutes in her evaluation and care.  DVT prophylaxis: Heparin subQ Code Status: Full Family Communication: Pt in room, family not at bedsie Disposition Plan: SNF when bed available  Winford Hehn, DO [facility] Direct contact: see [email or web address]  7PM-7AM contact night coverage as above 12/31/2018, 7:29 PM  LOS: 7 days

## 2019-01-01 NOTE — Progress Notes (Signed)
PROGRESS NOTE  Desiree Salinas [id number] DOB: [birth year] DOA: 12/24/2018 PCP: [name], MD  Brief History   68 y.o.femalewith medical history significant ofdementia, anemia, anxiety, arthritis, COPD, depression, hyperlipidemia, hospital admission 9/22-9/28 for intertrochanteric fracture of right hip status post intramedullary nailing by Dr. Sharol Given on 9/23 presenting from her nursing home with complaints of right hip pain.Patient is somnolent after receiving fentanyl for pain. No history could be obtained from her. Per caregiver at bedside, this morning patient slipped out of her wheelchair while staff at the nursing home were helping her take a bath. She did not hit her head or lose consciousness. Patient has been complaining of pain in her right hip.  ED Course:Hemodynamically stable. White blood cell count 14.9. Hemoglobin 11.4, stable compared to prior labs. UA pending. SARS-CoV-2 test pending. X-ray of right hip and pelvis showing previous intramedullary rod and screw fixation of comminuted intertrochanteric fracture, now with acute displaced periprosthetic fracture involving the proximal shaft of the femur. No femoral head dislocation. Pt underwent ORIf of the right hip on 12/26/2018. She tolerated the procedure well.  Consultants  . Orthopedic surgery  Procedures  . Intramedullary nailing of the right hip  Antibiotics   Anti-infectives (From admission, onward)   Start     Dose/Rate Route Frequency Ordered Stop   12/26/18 0600  ceFAZolin (ANCEF) IVPB 2g/100 mL premix     2 g 200 mL/hr over 30 Minutes Intravenous On call to O.R. 12/25/18 2136 12/26/18 [id number]    .  Subjective  The patient is somnolent. She is compliant with exam.  Objective   Vitals:  Vitals:   01/01/19 0353 01/01/19 0817  BP: (!) 108/59 (!) 101/52  Pulse: 92 88  Resp: 17 14  Temp: 98.2 F (36.8 C) 98.6 F (37 C)  SpO2: 93% 95%    Exam:  Constitutional:  . The patient is awake,  alert, and oriented x 3. No acute distress. Respiratory:  . No increased work of breathing. . No wheezes, rales, or rhonchi . No tactile fremitus Cardiovascular:  . Regular rate and rhythm . No murmurs, ectopy, or gallups. . No lateral PMI. No thrills. Abdomen:  . Abdomen is soft, non-tender, non-distended . No hernias, masses, or organomegaly . Normoactive bowel sounds.  Musculoskeletal:  . No cyanosis, clubbing, or edema Skin:  . No rashes, lesions, ulcers . palpation of skin: no induration or nodules Neurologic:  . CN 2-12 intact . Sensation all 4 extremities intact  I have personally reviewed the following:   Today's Data  . BMP, CBC, Vitals   Scheduled Meds: . aspirin  325 mg Oral Q breakfast  . atorvastatin  40 mg Oral QHS  . busPIRone  20 mg Oral Daily  . docusate sodium  100 mg Oral BID  . escitalopram  20 mg Oral Daily  . feeding supplement (ENSURE ENLIVE)  237 mL Oral BID BM  . ferrous sulfate  325 mg Oral Q breakfast  . Melatonin  3 mg Oral QHS  . pantoprazole  40 mg Oral Daily  . QUEtiapine  100 mg Oral QHS   Continuous Infusions: . lactated ringers Stopped (12/26/18 1102)  . methocarbamol (ROBAXIN) IV      Principal Problem:   Periprosthetic fracture of hip Active Problems:   HYPERCHOLESTEROLEMIA   Dementia with aggressive behavior (HCC)   Leukocytosis   COPD (chronic obstructive pulmonary disease) (HCC)   LOS: 8 days   A & P   R hip periprosthetic fracture: ORIF on 12/26/2018.  Patient tolerated well. Management as per orthopedic surgery. Plan to d/c to SNF. COVID test is neg. Discussed with CM. Awaiting response form [facility]  Leukocytosis: Resolving. Reactive and due to acute fracture.  COPD: Noted and stable. Continue Combivent as tolerated. The patient is saturating at 95% on room air.  Dementia with Aggressive behavior: Have resumed home meds as tolerated. She remains stable at this time.  Hyperlipidemai: Continue statin as  at home.  Hypotension: Recent hypotension requiring IVF bolus was noted. Now low normotensive.  Held home pindolol with improvement in BP. Consider resuming down the road as BP tolerates.  Acute blood loss anemia in the setting of iron deficiency anemia: pt has received IV iron supplementation. Hgb dropped a little lower. Monitor and transfuse for hemoglobin less than 7.0.  I have seen and examined this patient myself. I have spent 34 minutes in her evaluation and care.  DVT prophylaxis: Heparin subQ Code Status: Full Family Communication: Pt in room, family not at bedsie Disposition Plan: SNF when bed available   Eliya Geiman, DO [facility] Direct contact: see [email or web address]  7PM-7AM contact night coverage as above 12/31/2018, 7:29 PM  LOS: 7 days

## 2019-01-02 LAB — CBC WITH DIFFERENTIAL/PLATELET
Abs Immature Granulocytes: 0.12 10*3/uL — ABNORMAL HIGH (ref 0.00–0.07)
Basophils Absolute: 0.1 10*3/uL (ref 0.0–0.1)
Basophils Relative: 1 %
Eosinophils Absolute: 0.5 10*3/uL (ref 0.0–0.5)
Eosinophils Relative: 4 %
HCT: 26.7 % — ABNORMAL LOW (ref 36.0–46.0)
Hemoglobin: 8.3 g/dL — ABNORMAL LOW (ref 12.0–15.0)
Immature Granulocytes: 1 %
Lymphocytes Relative: 15 %
Lymphs Abs: 1.6 10*3/uL (ref 0.7–4.0)
MCH: 29.3 pg (ref 26.0–34.0)
MCHC: 31.1 g/dL (ref 30.0–36.0)
MCV: 94.3 fL (ref 80.0–100.0)
Monocytes Absolute: 1 10*3/uL (ref 0.1–1.0)
Monocytes Relative: 9 %
Neutro Abs: 7.6 10*3/uL (ref 1.7–7.7)
Neutrophils Relative %: 70 %
Platelets: 542 10*3/uL — ABNORMAL HIGH (ref 150–400)
RBC: 2.83 MIL/uL — ABNORMAL LOW (ref 3.87–5.11)
RDW: 13.5 % (ref 11.5–15.5)
WBC: 10.9 10*3/uL — ABNORMAL HIGH (ref 4.0–10.5)
nRBC: 0 % (ref 0.0–0.2)

## 2019-01-02 LAB — BASIC METABOLIC PANEL
Anion gap: 9 (ref 5–15)
BUN: 10 mg/dL (ref 8–23)
CO2: 23 mmol/L (ref 22–32)
Calcium: 8.3 mg/dL — ABNORMAL LOW (ref 8.9–10.3)
Chloride: 107 mmol/L (ref 98–111)
Creatinine, Ser: 0.61 mg/dL (ref 0.44–1.00)
GFR calc Af Amer: >60 mL/min (ref >60)
GFR calc non Af Amer: >60 mL/min (ref >60)
Glucose, Bld: 98 mg/dL (ref 70–99)
Potassium: 3.8 mmol/L (ref 3.5–5.1)
Sodium: 139 mmol/L (ref 135–145)

## 2019-01-02 MED ORDER — FERROUS SULFATE 325 (65 FE) MG PO TABS: 325.0000 mg | ORAL_TABLET | Freq: Every day | ORAL | 0 refills | Status: DC

## 2019-01-02 MED ORDER — HYDROCODONE-ACETAMINOPHEN 5-325 MG PO TABS: 1.0000 | ORAL_TABLET | ORAL | 0 refills | Status: DC | PRN

## 2019-01-02 MED ORDER — QUETIAPINE FUMARATE 25 MG PO TABS: 25.0000 mg | ORAL_TABLET | Freq: Three times a day (TID) | ORAL | 0 refills | Status: DC | PRN

## 2019-01-02 MED ORDER — ENSURE ENLIVE PO LIQD: 237.0000 mL | Freq: Two times a day (BID) | ORAL | 12 refills | Status: DC

## 2019-01-02 NOTE — Plan of Care (Signed)
  Problem: Clinical Measurements: Goal: Will remain free from infection Outcome: Progressing   Problem: Coping: Goal: Level of anxiety will decrease Outcome: Progressing   Problem: Pain Managment: Goal: General experience of comfort will improve Outcome: Progressing   Problem: Safety: Goal: Ability to remain free from injury will improve Outcome: Progressing   Problem: Skin Integrity: Goal: Risk for impaired skin integrity will decrease Outcome: Progressing   Problem: Clinical Measurements: Goal: Postoperative complications will be avoided or minimized Outcome: Progressing

## 2019-01-02 NOTE — Progress Notes (Signed)
1 week s/p  ORIF Periprosthetic fracture. Comfortable. Dressing without new drainage. Distal CMS intact. Hgb 8.3 Will need follow up 1 week with Dr. Sharol Given.

## 2019-01-02 NOTE — TOC Progression Note (Signed)
Transition of Care [facility]) - Progression Note    Patient Details  Name: Desiree Salinas MRN: [id number] Date of Birth: 1950/08/21  Transition of Care [facility]) CM/SW [place], [place] Phone Number: 01/02/2019, 10:20 AM  Clinical Narrative:     CSW called left message for admission coordinator to return call.  Thurmond Butts, MSW, [facility] Clinical Social Worker [phone]   Expected Discharge Plan: Assisted Living Barriers to Discharge: SNF Pending discharge summary, SNF Pending discharge orders  Expected Discharge Plan and Services Expected Discharge Plan: Assisted Living In-house Referral: Clinical Social Work     Living arrangements for the past 2 months: [place]                                       Social Determinants of Health (SDOH) Interventions    Readmission Risk Interventions Readmission Risk Prevention Plan 11/28/2018  Transportation Screening Complete  PCP or Specialist Appt within 3-5 Days Not Complete  Not Complete comments Transitioning to [place] [facility]  [place] or Home Care Consult Complete  Social Work Consult for [place] Planning/Counseling Complete  Palliative Care Screening Not Applicable  Medication Review Press photographer) Complete  Some recent data might be hidden

## 2019-01-02 NOTE — Progress Notes (Signed)
Physical Therapy Treatment Patient Details Name: Desiree Salinas MRN: [id number] DOB: May 21, 1950 Today's Date: 01/02/2019    History of Present Illness Desiree Salinas is a 68 y.o. female who presents 12/24/18 with right periprosthetic femur fracture.  Patient has advanced dementia and has around-the-clock care she fell from her wheelchair.  She is a month out from intramedullary fixation for right intertrochanteric hip fracture. Now s/p R femur hardware removal 10/23. PMH including but not limited to dementia, COPD and substance abuse.    PT Comments    Pt continuing to require total assist for mobility at this time, pt's main limiting factor being pain. Pt did not tolerate any PROM exercises to RLE today due to severe pain, pt calling out in pain whenever PT mobilized RLE. Pt required total assist for squat pivot to recliner this session, and was resting comfortably in chair upon PT exit. Pt's caregiver present and assisting this session. PT to continue to follow acutely, will progress mobility as able.   Follow Up Recommendations  SNF;Supervision/Assistance - 24 hour     Equipment Recommendations  None recommended by PT    Recommendations for Other Services       Precautions / Restrictions Precautions Precautions: Fall Precaution Comments: Advanced dementia Restrictions Weight Bearing Restrictions: Yes RLE Weight Bearing: Weight bearing as tolerated Other Position/Activity Restrictions: transfers only    Mobility  Bed Mobility Overal bed mobility: Needs Assistance Bed Mobility: Rolling;Sidelying to Sit Rolling: Mod assist Sidelying to sit: Max assist;HOB elevated       General bed mobility comments: mod assist for rolling onto L side for trunk and LE translation. Max assist for LE lifting/translation to EOB, trunk elevation, and scooting to EOB with use of bed pads. Pt vocalizing due to pain  Transfers Overall transfer level: Needs assistance Equipment used: Rolling walker  (2 wheeled);1 person hand held assist Transfers: Squat Pivot Transfers;Sit to/from Stand Sit to Stand: Total assist;+2 safety/equipment;+2 physical assistance   Squat pivot transfers: +2 safety/equipment;Total assist     General transfer comment: Total assist +2 for sit to stand for pericare as pt soiled in urine upon PT arrival to room. Pt unable to come to full standing or WB with +2 and RW. Max assist for squat pivot, hand placement on ischial tuberosities and pt holding onto PT's forearms. Caregiver assisted with scooting pt back in chair once safely seated in recliner.  Ambulation/Gait                 Stairs             Wheelchair Mobility    Modified Rankin (Stroke Patients Only)       Balance Overall balance assessment: Needs assistance;History of Falls Sitting-balance support: Feet supported;Single extremity supported Sitting balance-Leahy Scale: Poor Sitting balance - Comments: pt able to prop on L elbow and sit without assistance, requires min-mod assist to maintain sitting balance in upright and limited by pain. PT facilitating upright posture and anterior pelvic tilt in sitting. Postural control: Posterior lean;Left lateral lean   Standing balance-Leahy Scale: Zero Standing balance comment: total A x2                            Cognition Arousal/Alertness: Awake/alert Behavior During Therapy: Anxious;Restless Overall Cognitive Status: History of cognitive impairments - at baseline  General Comments: pt pleasant with PT, anxious and restless about mobility requiring increased time and max verbal and tactile cuing for mobility. Pt's caregiver Desiree Salinas (not Desiree Salinas her sister) in room with pt.      Exercises General Exercises - Lower Extremity Long Arc Quad: Seated;Left;AROM;5 reps Hip Flexion/Marching: AROM;Left;5 reps;Seated    General Comments        Pertinent Vitals/Pain      Home  Living                      Prior Function            PT Goals (current goals can now be found in the care plan section) Acute Rehab PT Goals Patient Stated Goal: none stated PT Goal Formulation: Patient unable to participate in goal setting Time For Goal Achievement: 01/10/19 Potential to Achieve Goals: Fair Progress towards PT goals: Progressing toward goals    Frequency    Min 2X/week      PT Plan Current plan remains appropriate    Co-evaluation              AM-PAC PT "6 Clicks" Mobility   Outcome Measure  Help needed turning from your back to your side while in a flat bed without using bedrails?: A Lot Help needed moving from lying on your back to sitting on the side of a flat bed without using bedrails?: A Lot Help needed moving to and from a bed to a chair (including a wheelchair)?: Total Help needed standing up from a chair using your arms (e.g., wheelchair or bedside chair)?: Total Help needed to walk in hospital room?: Total Help needed climbing 3-5 steps with a railing? : Total 6 Click Score: 8    End of Session Equipment Utilized During Treatment: Gait belt Activity Tolerance: Patient limited by pain Patient left: in chair;with call bell/phone within reach;with chair alarm set;with family/visitor present(caregiver Desiree Salinas at bedside) Nurse Communication: Mobility status;Other (comment)(needs new linens, requires squat pivot back to bed) PT Visit Diagnosis: Other abnormalities of gait and mobility (R26.89);Pain Pain - Right/Left: Right Pain - part of body: Hip     Time: 1121-1150 PT Time Calculation (min) (ACUTE ONLY): 29 min  Charges:  $Therapeutic Activity: 23-37 mins                     Temiloluwa Recchia E, PT Acute Rehabilitation Services Pager [phone]  Office [phone]   Zeinab Rodwell D [place] 01/02/2019, 12:08 PM

## 2019-01-02 NOTE — TOC Progression Note (Signed)
Transition of Care [facility]) - Progression Note    Patient Details  Name: Desiree Salinas MRN: [id number] Date of Birth: 14-Mar-1950  [facility] CM/SW [place], [place] Phone Number: 01/02/2019, 1:13 PM  Clinical Narrative:     CSW spoke with [facility], Marry Guan- she advised after discussion with her staff, they believe it is best for the patient to to to [place] rehab at [facility] prior to returning to [facility].   CSW spoke with patient's sister, [name] and informed [facility] wants her to have ST rehab at [facility]. CSW was given permission to fax out referrals.   CSW contacted - [facility]- left voice message to return call. [facility], will not have bed available until next week, [facility]- waiting on response.   CSW will provide bed offers once available. Patient's PSARR is pending.  Thurmond Butts, MSW, [facility] Clinical Social Worker [phone]     Expected Discharge Plan: Assisted Living Barriers to Discharge: SNF Pending discharge summary, SNF Pending discharge orders  Expected Discharge Plan and Services Expected Discharge Plan: Assisted Living In-house Referral: Clinical Social Work     Living arrangements for the past 2 months: [place] Expected Discharge Date: 01/02/19                                     Social Determinants of Health (SDOH) Interventions    Readmission Risk Interventions Readmission Risk Prevention Plan 11/28/2018  Transportation Screening Complete  PCP or Specialist Appt within 3-5 Days Not Complete  Not Complete comments Transitioning to [facility]  [place] or Home Care Consult Complete  Social Work Consult for [place] Planning/Counseling Complete  Palliative Care Screening Not Applicable  Medication Review (RN Care Manager) Complete  Some recent data might be hidden

## 2019-01-02 NOTE — Plan of Care (Signed)

## 2019-01-02 NOTE — Progress Notes (Signed)
PROGRESS NOTE  Desiree Salinas [id number] DOB: 03-25-1950 DOA: 12/24/2018 PCP: [name], MD  Brief History   68 y.o.femalewith medical history significant ofdementia, anemia, anxiety, arthritis, COPD, depression, hyperlipidemia, hospital admission 9/22-9/28 for intertrochanteric fracture of right hip status post intramedullary nailing by Dr. Sharol Given on 9/23 presenting from her nursing home with complaints of right hip pain.Patient is somnolent after receiving fentanyl for pain. No history could be obtained from her. Per caregiver at bedside, this morning patient slipped out of her wheelchair while staff at the nursing home were helping her take a bath. She did not hit her head or lose consciousness. Patient has been complaining of pain in her right hip.  ED Course:Hemodynamically stable. White blood cell count 14.9. Hemoglobin 11.4, stable compared to prior labs. UA pending. SARS-CoV-2 test pending. X-ray of right hip and pelvis showing previous intramedullary rod and screw fixation of comminuted intertrochanteric fracture, now with acute displaced periprosthetic fracture involving the proximal shaft of the femur. No femoral head dislocation. Pt underwent ORIf of the right hip on 12/26/2018. She tolerated the procedure well. Unfortunately, [place] where the patient resides will not be able to take the patient back until she has completed some rehab to return her to her usual level of functioning.  Consultants  . Orthopedic surgery  Procedures  . Intramedullary nailing of the right hip  Antibiotics   Anti-infectives (From admission, onward)   Start     Dose/Rate Route Frequency Ordered Stop   12/26/18 0600  ceFAZolin (ANCEF) IVPB 2g/100 mL premix     2 g 200 mL/hr over 30 Minutes Intravenous On call to O.R. 12/25/18 2136 12/26/18 [id number]     Subjective  The patient is somnolent and lying in bed. No new complaints.  Objective   Vitals:  Vitals:   01/02/19 0903  01/02/19 1356  BP: 104/60 118/73  Pulse: 84 91  Resp: 18 18  Temp: 97.8 F (36.6 C) 98.4 F (36.9 C)  SpO2: 95% 90%    Exam:  Constitutional:  . The patient is somnolent. No new complaints. No acute distress. Respiratory:  . No increased work of breathing. . No wheezes, rales, or rhonchi . No tactile fremitus Cardiovascular:  . Regular rate and rhythm . No murmurs, ectopy, or gallups. . No lateral PMI. No thrills. Abdomen:  . Abdomen is soft, non-tender, non-distended . No hernias, masses, or organomegaly . Normoactive bowel sounds.  Musculoskeletal:  . No cyanosis, clubbing, or edema Skin:  . No rashes, lesions, ulcers . palpation of skin: no induration or nodules Neurologic:  . CN 2-12 intact . Sensation all 4 extremities intact  I have personally reviewed the following:   Today's Data  . BMP, CBC, Vitals   Scheduled Meds: . aspirin  325 mg Oral Q breakfast  . atorvastatin  40 mg Oral QHS  . busPIRone  20 mg Oral Daily  . docusate sodium  100 mg Oral BID  . escitalopram  20 mg Oral Daily  . feeding supplement (ENSURE ENLIVE)  237 mL Oral BID BM  . ferrous sulfate  325 mg Oral Q breakfast  . Melatonin  3 mg Oral QHS  . pantoprazole  40 mg Oral Daily  . QUEtiapine  100 mg Oral QHS   Continuous Infusions: . lactated ringers Stopped (12/26/18 1102)  . methocarbamol (ROBAXIN) IV      Principal Problem:   Periprosthetic fracture of hip Active Problems:   HYPERCHOLESTEROLEMIA   Dementia with aggressive behavior ([place])  Leukocytosis   COPD (chronic obstructive pulmonary disease) (HCC)   LOS: 9 days   A & P   R hip periprosthetic fracture: ORIF on 12/26/2018. Patient tolerated well. Management as per orthopedic surgery. Plan to d/c to SNF. COVID test is neg. Discussed with CM. [place] has stated that they cannot take the patient back until she has completed rehab.  Leukocytosis: Resolving. Reactive and due to acute fracture. Improving.   COPD: Noted and stable. Continue Combivent as tolerated. The patient is saturating at 90% on room air.  Dementia with Aggressive behavior: Have resumed home meds as tolerated. She remains stable at this time.  Hyperlipidemai: Continue statin as at home.  Hypotension: Recent hypotension requiring IVF bolus was noted. Now low normotensive. Held home pindolol with improvement in BP. Consider resuming down the road as BP tolerates.  Acute blood loss anemia in the setting of iron deficiency anemia: pt has received IV iron supplementation. Hgb dropped a little lower. Monitor and transfuse for hemoglobin less than 7.0.  I have seen and examined this patient myself. I have spent 32 minutes in her evaluation and care.  DVT prophylaxis: Heparin subQ Code Status: Full Family Communication: Pt in room, family not at bedsie Disposition Plan: SNF when bed available  Marquavious Nazar, DO [facility] Direct contact: see [email or web address]  7PM-7AM contact night coverage as above 01/02/2019, 5:24 PM  LOS: 7 days

## 2019-01-03 NOTE — Plan of Care (Signed)
  Problem: Skin Integrity: Goal: Risk for impaired skin integrity will decrease Outcome: Progressing   

## 2019-01-03 NOTE — Plan of Care (Signed)
  Problem: Pain Managment: Goal: General experience of comfort will improve Outcome: Progressing   Problem: Safety: Goal: Ability to remain free from injury will improve Outcome: Progressing   Problem: Skin Integrity: Goal: Risk for impaired skin integrity will decrease Outcome: Progressing   

## 2019-01-03 NOTE — Progress Notes (Signed)
PROGRESS NOTE  Desiree Salinas [id number] DOB: 05-23-1950 DOA: 12/24/2018 PCP: [name], MD  Brief History   68 y.o.femalewith medical history significant ofdementia, anemia, anxiety, arthritis, COPD, depression, hyperlipidemia, hospital admission 9/22-9/28 for intertrochanteric fracture of right hip status post intramedullary nailing by Dr. Sharol Given on 9/23 presenting from her nursing home with complaints of right hip pain.Patient is somnolent after receiving fentanyl for pain. No history could be obtained from her. Per caregiver at bedside, this morning patient slipped out of her wheelchair while staff at the nursing home were helping her take a bath. She did not hit her head or lose consciousness. Patient has been complaining of pain in her right hip.  ED Course:Hemodynamically stable. White blood cell count 14.9. Hemoglobin 11.4, stable compared to prior labs. UA pending. SARS-CoV-2 test pending. X-ray of right hip and pelvis showing previous intramedullary rod and screw fixation of comminuted intertrochanteric fracture, now with acute displaced periprosthetic fracture involving the proximal shaft of the femur. No femoral head dislocation. Pt underwent ORIf of the right hip on 12/26/2018. She tolerated the procedure well. Unfortunately, [place] where the patient resides will not be able to take the patient back until she has completed some rehab to return her to her usual level of functioning.  Consultants  . Orthopedic surgery  Procedures  . Intramedullary nailing of the right hip  Antibiotics   Anti-infectives (From admission, onward)   Start     Dose/Rate Route Frequency Ordered Stop   12/26/18 0600  ceFAZolin (ANCEF) IVPB 2g/100 mL premix     2 g 200 mL/hr over 30 Minutes Intravenous On call to O.R. 12/25/18 2136 12/26/18 [id number]     Subjective  The patient is somnolent and lying in bed. No new complaints.  Objective   Vitals:  Vitals:   01/03/19 0759  01/03/19 1207  BP: 111/85 124/77  Pulse: 96 98  Resp: 18 18  Temp: 98.3 F (36.8 C) 97.9 F (36.6 C)  SpO2: 93% 94%    Exam:  Constitutional:  . The patient is somnolent. No new complaints. No acute distress. Respiratory:  . No increased work of breathing. . No wheezes, rales, or rhonchi . No tactile fremitus Cardiovascular:  . Regular rate and rhythm . No murmurs, ectopy, or gallups. . No lateral PMI. No thrills. Abdomen:  . Abdomen is soft, non-tender, non-distended . No hernias, masses, or organomegaly . Normoactive bowel sounds.  Musculoskeletal:  . No cyanosis, clubbing, or edema Skin:  . No rashes, lesions, ulcers . palpation of skin: no induration or nodules Neurologic:  . CN 2-12 intact . Sensation all 4 extremities intact  I have personally reviewed the following:   Today's Data  . BMP, CBC, Vitals   Scheduled Meds: . aspirin  325 mg Oral Q breakfast  . atorvastatin  40 mg Oral QHS  . busPIRone  20 mg Oral Daily  . docusate sodium  100 mg Oral BID  . escitalopram  20 mg Oral Daily  . feeding supplement (ENSURE ENLIVE)  237 mL Oral BID BM  . ferrous sulfate  325 mg Oral Q breakfast  . Melatonin  3 mg Oral QHS  . pantoprazole  40 mg Oral Daily  . QUEtiapine  100 mg Oral QHS   Continuous Infusions: . lactated ringers Stopped (12/26/18 1102)  . 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 Patient tolerated well. Management as per orthopedic surgery. Plan to d/c to SNF. COVID test is neg. Discussed with CM. [place] has stated that they cannot take the patient back until she has completed rehab. Social work has been consulted. She is awaiting placement.  Leukocytosis:  Resolving. Reactive and due to acute fracture. Improving.  COPD: Noted and stable. Continue Combivent as tolerated. The patient is saturating at 94% on room air.  Dementia with Aggressive behavior: Have resumed home meds as tolerated. She remains stable at this time.  Hyperlipidemai: Continue statin as at home.  Hypotension: Resolved. Recent hypotension requiring IVF bolus was noted. Now low normotensive. Held home pindolol with improvement in BP. Consider resuming down the road as BP tolerates.  Acute blood loss anemia in the setting of iron deficiency anemia: pt has received IV iron supplementation. Hgb dropped a little lower. Monitor and transfuse for hemoglobin less than 7.0.  I have seen and examined this patient myself. I have spent 30 minutes in her evaluation and care.  DVT prophylaxis: Heparin subQ Code Status: Full Family Communication: Pt in room, family not at bedsie Disposition Plan: SNF when bed available  Ardeth Repetto, DO [facility] Direct contact: see [email or web address]  7PM-7AM contact night coverage as above 01/03/2019, 4:34 PM  LOS: 7 days

## 2019-01-03 NOTE — Plan of Care (Signed)
  Problem: Safety: Goal: Ability to remain free from injury will improve Outcome: Progressing   

## 2019-01-04 ENCOUNTER — Inpatient Hospital Stay (HOSPITAL_COMMUNITY): Payer: Medicare Other

## 2019-01-04 DIAGNOSIS — R6510 Systemic inflammatory response syndrome (SIRS) of non-infectious origin without acute organ dysfunction: Secondary | ICD-10-CM

## 2019-01-04 LAB — BASIC METABOLIC PANEL
Anion gap: 13 (ref 5–15)
BUN: 12 mg/dL (ref 8–23)
CO2: 19 mmol/L — ABNORMAL LOW (ref 22–32)
Calcium: 8.2 mg/dL — ABNORMAL LOW (ref 8.9–10.3)
Chloride: 104 mmol/L (ref 98–111)
Creatinine, Ser: 0.58 mg/dL (ref 0.44–1.00)
GFR calc Af Amer: >60 mL/min (ref >60)
GFR calc non Af Amer: >60 mL/min (ref >60)
Glucose, Bld: 107 mg/dL — ABNORMAL HIGH (ref 70–99)
Potassium: 3.6 mmol/L (ref 3.5–5.1)
Sodium: 136 mmol/L (ref 135–145)

## 2019-01-04 LAB — URINALYSIS, ROUTINE W REFLEX MICROSCOPIC
Bilirubin Urine: NEGATIVE
Glucose, UA: NEGATIVE mg/dL
Hgb urine dipstick: NEGATIVE
Ketones, ur: NEGATIVE mg/dL
Nitrite: NEGATIVE
Protein, ur: NEGATIVE mg/dL
Specific Gravity, Urine: 1.028 (ref 1.005–1.030)
pH: 5 (ref 5.0–8.0)

## 2019-01-04 LAB — CBC
HCT: 30 % — ABNORMAL LOW (ref 36.0–46.0)
Hemoglobin: 9.4 g/dL — ABNORMAL LOW (ref 12.0–15.0)
MCH: 29.4 pg (ref 26.0–34.0)
MCHC: 31.3 g/dL (ref 30.0–36.0)
MCV: 93.8 fL (ref 80.0–100.0)
Platelets: 555 10*3/uL — ABNORMAL HIGH (ref 150–400)
RBC: 3.2 MIL/uL — ABNORMAL LOW (ref 3.87–5.11)
RDW: 14.3 % (ref 11.5–15.5)
WBC: 16.2 10*3/uL — ABNORMAL HIGH (ref 4.0–10.5)
nRBC: 0 % (ref 0.0–0.2)

## 2019-01-04 MED ORDER — SODIUM CHLORIDE 0.9 % IV SOLN
INTRAVENOUS | Status: AC
  Administered 2019-01-04: 100 mL/h via INTRAVENOUS
  Administered 2019-01-04: 18:00:00 via INTRAVENOUS

## 2019-01-04 MED ORDER — VANCOMYCIN HCL IN DEXTROSE 1-5 GM/200ML-% IV SOLN: 1000.0000 mg | INTRAVENOUS | Status: DC

## 2019-01-04 MED ORDER — VANCOMYCIN HCL 10 G IV SOLR
1250.0000 mg | Freq: Once | INTRAVENOUS | Status: AC
  Administered 2019-01-05: 1250 mg via INTRAVENOUS
  Filled 2019-01-04 (×2): qty 1250

## 2019-01-04 MED ORDER — SODIUM CHLORIDE 0.9 % IV SOLN
2.0000 g | Freq: Two times a day (BID) | INTRAVENOUS | Status: DC
  Administered 2019-01-04 – 2019-01-06 (×4): 2 g via INTRAVENOUS
  Filled 2019-01-04 (×4): qty 2

## 2019-01-04 NOTE — Plan of Care (Signed)
  Problem: Pain Managment: Goal: General experience of comfort will improve Outcome: Progressing   

## 2019-01-04 NOTE — Progress Notes (Signed)
Occupational Therapy Treatment Patient Details Name: Desiree Salinas MRN: [id number] DOB: [birth year] Today's Date: 01/04/2019    History of present illness Desiree Salinas is a 68 y.o. female who presents 12/24/18 with right periprosthetic femur fracture.  Patient has advanced dementia and has around-the-clock care she fell from her wheelchair.  She is a month out from intramedullary fixation for right intertrochanteric hip fracture. Now s/p R femur hardware removal 10/23. PMH including but not limited to dementia, COPD and substance abuse.   OT comments  Pt. Seen with caregiver for skilled OT treatment session.  Focus of session was self and caregiver assisted feeding to promote safety and function during this and other ADL tasks. Education and examples provided for how to safely engage pt. In ADLS.  Bed mobility and repositioning education completed with caregiver for RLE. Eob/oob deferred today from caregiver who stated pt. Is unable to tolerate much movement at this time.    Follow Up Recommendations  SNF;Supervision/Assistance - 24 hour    Equipment Recommendations  None recommended by OT    Recommendations for Other Services      Precautions / Restrictions Precautions Precautions: Fall Precaution Comments: Advanced dementia Restrictions RLE Weight Bearing: Weight bearing as tolerated Other Position/Activity Restrictions: transfers only       Mobility Bed Mobility               General bed mobility comments: attempted repositioning and bed mobility with pt. and caregiver.  pt. with large triangle wedge but RLE in full external rotation upon arrival with knee bent and pressed into lower R bedrail.  attempted to neutralize the let in line encouraging pt. to extend knee. cont. to moan and resist movement of RLE. positioned rolled blanket to create a barrier from the bed rail in her knee. educated caregiver on positioning (she reports she is new to the pts. caregiver rotation and  works every other weekend).  L knee remained in flexion and pt. would not lower this extremity at all.  total A x2 to pull pt. up in bed.  attempted neutral alignment as pt. has tendancy to lean trunk to L.  positioned upright to cont. with food and beverage consumption.  Transfers                      Balance                                           ADL either performed or assessed with clinical judgement   ADL Overall ADL's : Needs assistance/impaired Eating/Feeding: Minimal assistance;With caregiver independent assisting;Set up;Bed level Eating/Feeding Details (indicate cue type and reason): caregiver present-reviewed attempting with familiar tasks when able ie: if awake allow her to hold cup and bring to mouth, or hold food items. this day pt. able to hold banana and take approx. 3 bites. held cup and took multiple sips. reviewed with care giver also that while pt. may be able to perform familiar tasks she may not stop without assistance ie: cont. to drink even if not full or thirsty.  caregiver verbalized understanding   Grooming Details (indicate cue type and reason): reviewed having pt. participate in grooming tasks as able but cont. wtih S and assistance as indicated  General ADL Comments: pt limited by cognitive impairments     Vision       Perception     Praxis      Cognition Arousal/Alertness: Awake/alert Behavior During Therapy: WFL for tasks assessed/performed Overall Cognitive Status: History of cognitive impairments - at baseline                                          Exercises     Shoulder Instructions       General Comments      Pertinent Vitals/ Pain       Pain Assessment: Faces Faces Pain Scale: Hurts whole lot Pain Location: R LE during attempts at repositioning and bed mobility Pain Descriptors / Indicators: Grimacing;Guarding;Moaning Pain Intervention(s):  Limited activity within patient's tolerance;Monitored during session;Repositioned  Home Living                                          Prior Functioning/Environment              Frequency  Min 2X/week        Progress Toward Goals  OT Goals(current goals can now be found in the care plan section)  Progress towards OT goals: Progressing toward goals     Plan      Co-evaluation                 AM-PAC OT "6 Clicks" Daily Activity     Outcome Measure   Help from another person eating meals?: None Help from another person taking care of personal grooming?: A Little Help from another person toileting, which includes using toliet, bedpan, or urinal?: Total Help from another person bathing (including washing, rinsing, drying)?: Total Help from another person to put on and taking off regular upper body clothing?: A Little Help from another person to put on and taking off regular lower body clothing?: Total 6 Click Score: 13    End of Session    OT Visit Diagnosis: Unsteadiness on feet (R26.81);Other abnormalities of gait and mobility (R26.89);History of falling (Z91.81);Muscle weakness (generalized) (M62.81);Other symptoms and signs involving cognitive function;Pain Pain - Right/Left: Right Pain - part of body: Hip   Activity Tolerance Patient limited by pain   Patient Left in bed;with call bell/phone within reach;with nursing/sitter in room(sitter requests mits off since she is in the room with pt. reviewed that if she is to leave the room she has to notify nursing to help her reapply the mits. she verbalized understanding)   Nurse Communication          [id number] OT Time Calculation (min): 20 min  Charges: OT General Charges $OT Visit: 1 Visit OT Treatments $Self Care/Home Management : 8-22 mins  Janice Coffin, COTA/L 01/04/2019, 9:28 AM

## 2019-01-04 NOTE — Progress Notes (Addendum)
PROGRESS NOTE  ARIFA ROMACK [id number] DOB: Nov 27, 1950 DOA: 12/24/2018 PCP: [name], MD  Brief History   68 y.o.femalewith medical history significant ofdementia, anemia, anxiety, arthritis, COPD, depression, hyperlipidemia, hospital admission 9/22-9/28 for intertrochanteric fracture of right hip status post intramedullary nailing by Dr. Sharol Given on 9/23 presenting from her nursing home with complaints of right hip pain.Patient was somnolent after receiving fentanyl for pain. No history could be obtained from her. Per caregiver, patient slipped out of her wheelchair while staff at the nursing home were helping her take a bath. She did not hit her head or lose consciousness. Patient was complaining of pain in her right hip.  He was found to have right hip periprosthetic fracture and is status post ORIF on 12/26/2018.  Noted to be SIRS positive 11/1. Discharge to SNF pending clinical workup.  Consultants  . Orthopedic surgery  Procedures  . Intramedullary nailing of the right hip  Antibiotics   Anti-infectives (From admission, onward)   Start     Dose/Rate Route Frequency Ordered Stop   12/26/18 0600  ceFAZolin (ANCEF) IVPB 2g/100 mL premix     2 g 200 mL/hr over 30 Minutes Intravenous On call to O.R. 12/25/18 2136 12/26/18 [id number]     Subjective  Patient lying in bed complaining that she "just feels out of it." Denies any pain or specific complaint at this time.  She is a poor historian and unable to provide detailed history    Objective   Vitals:  Vitals:   01/04/19 0755 01/04/19 1317  BP: 109/60 112/65  Pulse: 89 92  Resp: 18 18  Temp: 100.3 F (37.9 C) 99.4 F (37.4 C)  SpO2: 97% 98%    Exam:  Physical Exam Vitals signs and nursing note reviewed.  Constitutional:      Appearance: She is ill-appearing and diaphoretic.     Comments: Appears to have generalized discomfort  Cardiovascular:     Rate and Rhythm: Normal rate and regular rhythm.   Pulmonary:     Effort: Pulmonary effort is normal. No respiratory distress.     Breath sounds: Normal breath sounds.  Abdominal:     General: Abdomen is flat.     Palpations: Abdomen is soft.  Musculoskeletal:     Comments: Right lower extremity with brace SCDs in place  Neurological:     Mental Status: She is oriented to person, place, and time. Mental status is at baseline.  Psychiatric:     Comments: Awake and alert lying in bed with eyes closed but responding appropriately     I have personally reviewed the following:   Today's Data  . BMP, CBC, Vitals   Scheduled Meds: . aspirin  325 mg Oral Q breakfast  . atorvastatin  40 mg Oral QHS  . busPIRone  20 mg Oral Daily  . docusate sodium  100 mg Oral BID  . escitalopram  20 mg Oral Daily  . feeding supplement (ENSURE ENLIVE)  237 mL Oral BID BM  . ferrous sulfate  325 mg Oral Q breakfast  . Melatonin  3 mg Oral QHS  . pantoprazole  40 mg Oral Daily  . QUEtiapine  100 mg Oral QHS   Continuous Infusions: . sodium chloride    . lactated ringers Stopped (12/26/18 1102)  . methocarbamol (ROBAXIN) IV      Principal Problem:   Periprosthetic fracture of hip Active Problems:   HYPERCHOLESTEROLEMIA   Dementia with aggressive behavior (HCC)   Leukocytosis   COPD (chronic  obstructive pulmonary disease) ([place])   LOS: 11 days   A & P   SIRS, unknown source diaphoretic and ill-appearing.  No labs yesterday.  WBC 8.9->10.9->> 16.2.  T1 100.3, has been receiving full dose aspirin daily with occasional Tylenol so this temperature is blunted. -Chest x-ray -UA  -Cultures x2 -IV fluids -Vanc/Cefepime pending results  R hip periprosthetic fracture: ORIF on 12/26/2018. Patient tolerated well. Management as per orthopedic surgery. Plan to d/c to SNF. COVID test is neg. Discussed with CM. [place] has stated that they cannot take the patient back until she has completed rehab. Social work has been consulted. She is awaiting  placement.  COPD: Noted and stable. Continue Combivent as tolerated. The patient is saturating at well on room air.  Dementia with Aggressive behavior: stable continue home meds as tolerated.  Hyperlipidemia: Continue statin as at home.  Hypotension: Resolved. Recent hypotension requiring IVF bolus was noted. Now low normotensive. Held home pindolol with improvement in BP. Consider resuming down the road as BP tolerates.  Acute blood loss anemia in the setting of iron deficiency anemia: pt has received IV iron supplementation. resolved  DVT prophylaxis: SCDs Code Status: Full Family Communication: Pt in room, family not at bedsie Disposition Plan: pending clinical stability.   Time: 45 minutes with over 50% time coordinating care.  Marva Panda, DO [facility] Direct contact: see [email or web address]  7PM-7AM contact night coverage as above

## 2019-01-04 NOTE — Progress Notes (Signed)
Pharmacy Antibiotic Note  Desiree Salinas is a 69 y.o. female admitted on 12/24/2018 with right hip pain.  Patient underwent IM nailing of right hip on 11/26/18 and ORIF during this hospitalization on 12/26/18.  Now with SIRS and Pharmacy has been consulted for vancomycin and cefepime dosing.  SCr 0.58, CrCL 56 ml/min, Tmax 100.3, WBC 16.2.  Plan: Vanc 1250mg  IV x 1, then 1gm IV Q24H for AUC 460 using SCr 0.8 Cefepime 2gm IV Q12H Monitor renal fxn, clinical progress, vanc AUC as indicated   Height: 5\' 3"  (160 cm) Weight: 129 lb 13.6 oz (58.9 kg) IBW/kg (Calculated) : 52.4  Temp (24hrs), Avg:99.8 F (37.7 C), Min:99.4 F (37.4 C), Max:100.3 F (37.9 C)  Recent Labs  Lab 12/30/18 0328 12/31/18 0316 01/01/19 0345 01/02/19 0435 01/04/19 1110  WBC 10.3 10.7* 8.9 10.9* 16.2*  CREATININE 0.57 0.75 0.63 0.61 0.58    Estimated Creatinine Clearance: 56.4 mL/min (by C-G formula based on SCr of 0.58 mg/dL).    Allergies  Allergen Reactions  . Latex Other (See Comments)    "Allergic," per MAR  . Nickel Hives, Itching and Other (See Comments)    Weeping, also  . Other Other (See Comments)    Patient is "Allergic to metals," per MAR  . Cobalt Rash  . Eucerin [Basis Facial Moisturizer] Hives, Itching, Rash and Other (See Comments)    Eucerin lotion causes an allergic skin reaction    Vanc 11/1 >> Cefepime 11/1 >>  10/21, 10/26 covid - negative 10/22 surgical PCR - negative 11/1 UCx -  11/1 BCx -   Alaira Level D. Mina Marble, PharmD, BCPS, [place] 01/04/2019, 4:33 PM

## 2019-01-04 NOTE — Plan of Care (Signed)
  Problem: Safety: Goal: Ability to remain free from injury will improve Outcome: Progressing   

## 2019-01-04 NOTE — TOC Progression Note (Signed)
Transition of Care [facility]) - Progression Note    Patient Details  Name: Desiree Salinas MRN: [id number] Date of Birth: 01-11-1951  Transition of Care [facility]) CM/SW [place], LCSW Phone Number:[phone] 01/04/2019, 8:42 AM  Clinical Narrative:     CSW checked bed offer hub and there currently are no bed offers for client.  TOC team will continue to monitor for discharge planning needs.  Expected Discharge Plan: Assisted Living Barriers to Discharge: SNF Pending discharge summary, SNF Pending discharge orders  Expected Discharge Plan and Services Expected Discharge Plan: Assisted Living In-house Referral: Clinical Social Work     Living arrangements for the past 2 months: [place] Expected Discharge Date: 01/02/19                                     Social Determinants of Health (SDOH) Interventions    Readmission Risk Interventions Readmission Risk Prevention Plan 11/28/2018  Transportation Screening Complete  PCP or Specialist Appt within 3-5 Days Not Complete  Not Complete comments Transitioning to [place] [facility]  [place] or Home Care Consult Complete  Social Work Consult for [place] Planning/Counseling Complete  Palliative Care Screening Not Applicable  Medication Review Press photographer) Complete  Some recent data might be hidden

## 2019-01-05 LAB — BASIC METABOLIC PANEL
Anion gap: 8 (ref 5–15)
BUN: 12 mg/dL (ref 8–23)
CO2: 22 mmol/L (ref 22–32)
Calcium: 7.7 mg/dL — ABNORMAL LOW (ref 8.9–10.3)
Chloride: 104 mmol/L (ref 98–111)
Creatinine, Ser: 0.66 mg/dL (ref 0.44–1.00)
GFR calc Af Amer: >60 mL/min (ref >60)
GFR calc non Af Amer: >60 mL/min (ref >60)
Glucose, Bld: 134 mg/dL — ABNORMAL HIGH (ref 70–99)
Potassium: 3.7 mmol/L (ref 3.5–5.1)
Sodium: 134 mmol/L — ABNORMAL LOW (ref 135–145)

## 2019-01-05 LAB — CBC
HCT: 28.7 % — ABNORMAL LOW (ref 36.0–46.0)
Hemoglobin: 9.1 g/dL — ABNORMAL LOW (ref 12.0–15.0)
MCH: 29.6 pg (ref 26.0–34.0)
MCHC: 31.7 g/dL (ref 30.0–36.0)
MCV: 93.5 fL (ref 80.0–100.0)
Platelets: 543 10*3/uL — ABNORMAL HIGH (ref 150–400)
RBC: 3.07 MIL/uL — ABNORMAL LOW (ref 3.87–5.11)
RDW: 14.3 % (ref 11.5–15.5)
WBC: 14.5 10*3/uL — ABNORMAL HIGH (ref 4.0–10.5)
nRBC: 0 % (ref 0.0–0.2)

## 2019-01-05 LAB — URINE CULTURE: Culture: NO GROWTH

## 2019-01-05 LAB — AMMONIA: Ammonia: 20 umol/L (ref 9–35)

## 2019-01-05 MED ORDER — SODIUM CHLORIDE 0.9 % IV SOLN
INTRAVENOUS | Status: AC
  Administered 2019-01-05: 13:00:00 via INTRAVENOUS

## 2019-01-05 NOTE — Care Management Important Message (Signed)
Important Message  Patient Details  Name: Desiree Salinas MRN: [id number] Date of Birth: 1950-04-03   Medicare Important Message Given:  Yes     [name] 01/05/2019, 3:49 PM

## 2019-01-05 NOTE — Plan of Care (Signed)
  Problem: Education: Goal: Knowledge of General Education information will improve Description: Including pain rating scale, medication(s)/side effects and non-pharmacologic comfort measures Outcome: Progressing   Problem: Nutrition: Goal: Adequate nutrition will be maintained Outcome: Progressing   Problem: Pain Managment: Goal: General experience of comfort will improve Outcome: Progressing   Problem: Safety: Goal: Ability to remain free from injury will improve Outcome: Progressing   Problem: Skin Integrity: Goal: Risk for impaired skin integrity will decrease Outcome: Progressing   Problem: Pain Management: Goal: Pain level will decrease Outcome: Progressing

## 2019-01-05 NOTE — Progress Notes (Signed)
Desiree Salinas [id number] DOB: Sep 11, 1950 DOA: 12/24/2018 PCP: [name], MD  Brief History   68 y.o.femalewith medical history significant ofdementia, anemia, anxiety, arthritis, COPD, depression, hyperlipidemia, hospital admission 9/22-9/28 for intertrochanteric fracture of right hip status post intramedullary nailing by Dr. Sharol Given on 9/23 presenting from her nursing home with complaints of right hip pain.Patient was somnolent after receiving fentanyl for pain. No history could be obtained from her. Per caregiver, patient slipped out of her wheelchair while staff at the nursing home were helping her take a bath. She did not hit her head or lose consciousness. Patient was complaining of pain in her right hip.  He was found to have right hip periprosthetic fracture and is status post ORIF on 12/26/2018.  Noted to be SIRS positive 11/1. Discharge to SNF pending clinical workup.  Consultants  . Orthopedic surgery  Procedures  . Intramedullary nailing of the right hip  Antibiotics   Anti-infectives (From admission, onward)   Start     Dose/Rate Route Frequency Ordered Stop   01/05/19 2300  vancomycin (VANCOCIN) IVPB 1000 mg/200 mL premix  Status:  Discontinued     1,000 mg 200 mL/hr over 60 Minutes Intravenous Every 24 hours 01/04/19 1630 01/05/19 1156   01/04/19 1700  ceFEPIme (MAXIPIME) 2 g in sodium chloride 0.9 % 100 mL IVPB     2 g 200 mL/hr over 30 Minutes Intravenous Every 12 hours 01/04/19 1630     01/04/19 1645  vancomycin (VANCOCIN) 1,250 mg in sodium chloride 0.9 % 250 mL IVPB     1,250 mg 166.7 mL/hr over 90 Minutes Intravenous  Once 01/04/19 1630 01/05/19 0315   12/26/18 0600  ceFAZolin (ANCEF) IVPB 2g/100 mL premix     2 g 200 mL/hr over 30 Minutes Intravenous On call to O.R. 12/25/18 2136 12/26/18 [id number]     Subjective   Patient sleeping comfortably. Not arousable to voice but is arousable to tactile stimuli. Sister called on FaceTime by  Lelia [place] at bedside. Patient has been noted to be more drowsy than usual. Patient unable to provide history    Objective   Vitals:  Vitals:   01/05/19 0308 01/05/19 0751  BP: (!) 97/51 109/90  Pulse: 90 89  Resp:  19  Temp: 99.1 F (37.3 C) 98.8 F (37.1 C)  SpO2: 98% 96%    Exam:  Physical Exam Vitals signs and nursing note reviewed.  Constitutional:      Comments: Sleeping comfortably arousable to tactile stimuli  HENT:     Head: Normocephalic and atraumatic.     Mouth/Throat:     Mouth: Mucous membranes are moist.  Eyes:     Pupils: Pupils are equal, round, and reactive to light.  Cardiovascular:     Rate and Rhythm: Normal rate and regular rhythm.  Pulmonary:     Effort: Pulmonary effort is normal.     Breath sounds: Normal breath sounds.  Abdominal:     General: Abdomen is flat. There is no distension.     Palpations: Abdomen is soft.  Musculoskeletal:     Comments: Right hip with surgical dressing with dried blood, no erythema or warmth around surgical site.   Skin:    Coloration: Skin is not jaundiced.     Findings: No erythema.     Comments: Right heel with healing pressure ulcer.  Neurological:     Mental Status: She is disoriented.     I have personally reviewed the following:   Today's Data  .  BMP, CBC, Vitals   Scheduled Meds: . aspirin  325 mg Oral Q breakfast  . atorvastatin  40 mg Oral QHS  . docusate sodium  100 mg Oral BID  . escitalopram  20 mg Oral Daily  . feeding supplement (ENSURE ENLIVE)  237 mL Oral BID BM  . ferrous sulfate  325 mg Oral Q breakfast  . Melatonin  3 mg Oral QHS  . pantoprazole  40 mg Oral Daily  . QUEtiapine  100 mg Oral QHS   Continuous Infusions: . sodium chloride    . ceFEPime (MAXIPIME) IV 2 g (01/05/19 0654)  . lactated ringers Stopped (12/26/18 1102)  . methocarbamol (ROBAXIN) IV      Principal Problem:   Periprosthetic fracture of hip Active Problems:   HYPERCHOLESTEROLEMIA   Dementia with  aggressive behavior (HCC)   Leukocytosis   COPD (chronic obstructive pulmonary disease) (HCC)   LOS: 12 days   A & P   SIRS, unknown source fever improved, WBC 16.2->14.5. Completed 1 day Vanc/Cefepime. Blood cultures, urine culture, cxr negative. Has a left heel healing pressure ulcer previously noted without erythema or warmth. Right hip with dressing with dried blood. No erythema noted around wound -IV fluids -MRSA negative 10/22, discontinue Vancomycin  Acute encephalopathy in setting of above also received buspar this AM - Hold AM dose Buspar as this can cause drowsiness -Ammonia level  R hip periprosthetic fracture: ORIF on 12/26/2018. Patient tolerated well. Management as per orthopedic surgery. Plan to d/c to SNF. COVID test is neg. Discussed with CM. [place] has stated that they cannot take the patient back until she has completed rehab. Social work has been consulted.  -Wound care -Patient needs to get out of bed regularly to improve delirium -PT/OT  Healing pressure ulcer of right heel  Wound care  COPD: Noted and stable. Continue Combivent as tolerated. The patient is saturating at well on room air.  Dementia with Aggressive behavior: stable continue home meds as tolerated. -Hold AM buspar as above  Hyperlipidemia: Continue statin as at home.  Hypotension: Resolved. Recent hypotension requiring IVF bolus was noted. Now low normotensive. Held home pindolol with improvement in BP. Consider resuming down the road as BP tolerates.  Acute blood loss anemia in the setting of iron deficiency anemia: pt has received IV iron supplementation. resolved  DVT prophylaxis: SCDs Code Status: Full Family Communication: Discussed with sister via Face Time Disposition Plan: pending clinical stability.   Time: 35 minutes with over 50% time coordinating care.  Marva Panda, DO [facility] Direct contact: see [email or web address]  7PM-7AM contact night coverage as above

## 2019-01-06 LAB — BASIC METABOLIC PANEL
Anion gap: 12 (ref 5–15)
BUN: 7 mg/dL — ABNORMAL LOW (ref 8–23)
CO2: 20 mmol/L — ABNORMAL LOW (ref 22–32)
Calcium: 7.8 mg/dL — ABNORMAL LOW (ref 8.9–10.3)
Chloride: 105 mmol/L (ref 98–111)
Creatinine, Ser: 0.55 mg/dL (ref 0.44–1.00)
GFR calc Af Amer: >60 mL/min (ref >60)
GFR calc non Af Amer: >60 mL/min (ref >60)
Glucose, Bld: 100 mg/dL — ABNORMAL HIGH (ref 70–99)
Potassium: 3.4 mmol/L — ABNORMAL LOW (ref 3.5–5.1)
Sodium: 137 mmol/L (ref 135–145)

## 2019-01-06 LAB — CBC
HCT: 29.2 % — ABNORMAL LOW (ref 36.0–46.0)
Hemoglobin: 9.3 g/dL — ABNORMAL LOW (ref 12.0–15.0)
MCH: 29.5 pg (ref 26.0–34.0)
MCHC: 31.8 g/dL (ref 30.0–36.0)
MCV: 92.7 fL (ref 80.0–100.0)
Platelets: 421 10*3/uL — ABNORMAL HIGH (ref 150–400)
RBC: 3.15 MIL/uL — ABNORMAL LOW (ref 3.87–5.11)
RDW: 14.3 % (ref 11.5–15.5)
WBC: 10.6 10*3/uL — ABNORMAL HIGH (ref 4.0–10.5)
nRBC: 0 % (ref 0.0–0.2)

## 2019-01-06 LAB — SARS CORONAVIRUS 2 (TAT 6-24 HRS): SARS Coronavirus 2: NEGATIVE

## 2019-01-06 MED ORDER — SODIUM CHLORIDE 0.9 % IV SOLN
2.0000 g | Freq: Two times a day (BID) | INTRAVENOUS | Status: AC
  Administered 2019-01-06 – 2019-01-07 (×2): 2 g via INTRAVENOUS
  Filled 2019-01-06 (×2): qty 2

## 2019-01-06 MED ORDER — SODIUM CHLORIDE 0.9 % IV SOLN
INTRAVENOUS | Status: AC
  Administered 2019-01-06: 11:00:00 via INTRAVENOUS

## 2019-01-06 MED ORDER — POTASSIUM CHLORIDE 10 MEQ/100ML IV SOLN
10.0000 meq | INTRAVENOUS | Status: AC
  Administered 2019-01-06 (×2): 10 meq via INTRAVENOUS
  Filled 2019-01-06 (×2): qty 100

## 2019-01-06 NOTE — Progress Notes (Signed)
PROGRESS NOTE  Desiree Salinas [id number] DOB: [birth year] DOA: 12/24/2018 PCP: [name], MD  Brief History   68 y.o.femalewith medical history significant ofdementia, anemia, anxiety, arthritis, COPD, depression, hyperlipidemia, hospital admission 9/22-9/28 for intertrochanteric fracture of right hip status post intramedullary nailing by Dr. Sharol Given on 9/23 presenting from her nursing home with complaints of right hip pain.Patient was somnolent after receiving fentanyl for pain. No history could be obtained from her. Per caregiver, patient slipped out of her wheelchair while staff at the nursing home were helping her take a bath. She did not hit her head or lose consciousness. Patient was complaining of pain in her right hip.  He was found to have right hip periprosthetic fracture and is status post ORIF on 12/26/2018.  Noted to be SIRS positive 11/1 suspected secondary to UTI. Discharge to SNF pending clinical workup.  Consultants  . Orthopedic surgery  Procedures  . Intramedullary nailing of the right hip  Antibiotics   Anti-infectives (From admission, onward)   Start     Dose/Rate Route Frequency Ordered Stop   01/05/19 2300  vancomycin (VANCOCIN) IVPB 1000 mg/200 mL premix  Status:  Discontinued     1,000 mg 200 mL/hr over 60 Minutes Intravenous Every 24 hours 01/04/19 1630 01/05/19 1156   01/04/19 1700  ceFEPIme (MAXIPIME) 2 g in sodium chloride 0.9 % 100 mL IVPB     2 g 200 mL/hr over 30 Minutes Intravenous Every 12 hours 01/04/19 1630     01/04/19 1645  vancomycin (VANCOCIN) 1,250 mg in sodium chloride 0.9 % 250 mL IVPB     1,250 mg 166.7 mL/hr over 90 Minutes Intravenous  Once 01/04/19 1630 01/05/19 0315   12/26/18 0600  ceFAZolin (ANCEF) IVPB 2g/100 mL premix     2 g 200 mL/hr over 30 Minutes Intravenous On call to O.R. 12/25/18 2136 12/26/18 [id number]     Subjective   Patient alert and talkative today. She is a poor historian and speaks in the third person at  times. She responds to questioning and keeps tracks well. Denies any urinary complaints, abdominal pain, hip pain, nausea or vomiting. Aid at bedside and sister on FaceTime say this is her baseline mental status.    Objective   Vitals:  Vitals:   01/06/19 0512 01/06/19 0900  BP: (!) 108/55 (!) 94/49  Pulse: 81 89  Resp: 18 17  Temp: 98.3 F (36.8 C) (!) 97.4 F (36.3 C)  SpO2: 100% 97%    Exam:  Physical Exam Vitals signs and nursing note reviewed.  Constitutional:      Comments: Oriented x 1 to person  HENT:     Head: Normocephalic and atraumatic.     Mouth/Throat:     Mouth: Mucous membranes are moist.  Eyes:     Extraocular Movements: Extraocular movements intact.  Neck:     Musculoskeletal: Normal range of motion.  Cardiovascular:     Rate and Rhythm: Normal rate and regular rhythm.  Pulmonary:     Effort: Pulmonary effort is normal.     Breath sounds: Normal breath sounds.  Abdominal:     General: Abdomen is flat.     Palpations: Abdomen is soft.  Musculoskeletal:     Comments: Left hip with dressing with dried blood  Nontender to palpation of hip  Right heel with dressing over ulcer.  Neurological:     Mental Status: She is alert. Mental status is at baseline.  Psychiatric:        Mood  and Affect: Mood normal.        Behavior: Behavior normal.     Comments: At baseline mental status with dementia Speaks in third person at times.     I have personally reviewed the following:   Today's Data  . BMP, CBC, Vitals   Scheduled Meds: . aspirin  325 mg Oral Q breakfast  . atorvastatin  40 mg Oral QHS  . docusate sodium  100 mg Oral BID  . escitalopram  20 mg Oral Daily  . feeding supplement (ENSURE ENLIVE)  237 mL Oral BID BM  . ferrous sulfate  325 mg Oral Q breakfast  . Melatonin  3 mg Oral QHS  . pantoprazole  40 mg Oral Daily  . QUEtiapine  100 mg Oral QHS   Continuous Infusions: . sodium chloride 100 mL/hr at 01/06/19 1122  . ceFEPime  (MAXIPIME) IV 2 g (01/06/19 0554)  . lactated ringers Stopped (12/26/18 1102)  . methocarbamol (ROBAXIN) IV      Principal Problem:   Periprosthetic fracture of hip Active Problems:   HYPERCHOLESTEROLEMIA   Dementia with aggressive behavior (HCC)   Leukocytosis   COPD (chronic obstructive pulmonary disease) (HCC)   LOS: 13 days   A & P   SIRS, suspect secondary to UTI improved. UA with small leukocytes, rare bacteria and hazy appearance, Hx of E coli uti. WBC 16.2->14.5->10.6. Completed 1 day Vanc (MRSA negative, discontinued Vancomycin 11/2) and 2 days Cefepime. Blood cultures, urine culture, cxr negative. IV fluids this morning for soft BP - Continue Cefepime x 1 more day (2 more doses)  Acute metabolic and drug induced encephalopathy in setting of SIRS and Buspar Buspar held yesterday with return to baseline mental status per sister on FaceTime at bedside. - Discontinue Buspar -Continue Seroquel at bedtime  R hip periprosthetic fracture: ORIF on 12/26/2018. Patient tolerated well. Management as per orthopedic surgery. Plan to d/c to SNF once bed available. COVID test is neg. Discussed with CM. [place] has stated that they cannot take the patient back until she has completed rehab.  -Wound care -Patient needs to get out of bed regularly to improve delirium -PT/OT  Healing pressure ulcer of right heel  Wound care  COPD: Noted and stble. Continue Combivent as tolerated. The patient is saturating at well on room air.  Dementia with Aggressive behavior: stable continue meds as tolerated. -Hold Buspar -Continue Seroquel at bedtime  Hyperlipidemia: Continue statin as at home.  Hypokalemia replete  Hypotension: borderline Bps, patient states she runs in low 100s SBP.  Acute blood loss anemia in the setting of iron deficiency anemia: pt has received IV iron supplementation. resolved  DVT prophylaxis: SCDs Code Status: Full Family Communication: Discussed with  sister via Face Time Disposition Plan: pending clinical stability.   Time: 30 minutes with over 50% time coordinating care.  Marva Panda, DO [facility] Direct contact: see [email or web address]  7PM-7AM contact night coverage as above

## 2019-01-06 NOTE — TOC Progression Note (Signed)
Transition of Care [facility]) - Progression Note    Patient Details  Name: [name] MRN: [id number] Date of Birth: January 10, 1951  [facility]) CM/SW [place], LCSW Phone Number: 01/06/2019, 3:41 PM  Clinical Narrative:    Patient with 3 bed offers. CSW called and left message for patient's sister [name] [phone]. Patient will need short term rehab prior to returning back to [facility]. Waiting on family to make choice. Will need updated COVID test for discharge.   Expected Discharge Plan: Assisted Living Barriers to Discharge: SNF Pending discharge summary, SNF Pending discharge orders  Expected Discharge Plan and Services Expected Discharge Plan: Assisted Living In-house Referral: Clinical Social Work     Living arrangements for the past 2 months: [place] Expected Discharge Date: 01/02/19                                     Social Determinants of Health (SDOH) Interventions    Readmission Risk Interventions Readmission Risk Prevention Plan 11/28/2018  Transportation Screening Complete  PCP or Specialist Appt within 3-5 Days Not Complete  Not Complete comments Transitioning to [facility]  [place] or Home Care Consult Complete  Social Work Consult for [place] Planning/Counseling Complete  Palliative Care Screening Not Applicable  Medication Review Press photographer) Complete  Some recent data might be hidden

## 2019-01-06 NOTE — Therapy (Signed)
Physical Therapy Treatment Patient Details Name: Desiree Salinas MRN: [id number] DOB: 1950-08-21 Today's Date: 01/06/2019    History of Present Illness Desiree Salinas is a 68 y.o. female who presents 12/24/18 with right periprosthetic femur fracture.  Patient has advanced dementia and has around-the-clock care she fell from her wheelchair.  She is a month out from intramedullary fixation for right intertrochanteric hip fracture. Now s/p R femur hardware removal 10/23. PMH including but not limited to dementia, COPD and substance abuse.    PT Comments    Pt seen for mobility progress; limited progress this session.  Pt awake but kept eyes closed for a majority of the session, despite encouragement to open them.  Pt resistant to bed level exercises as well as bed mobility.  Pt required max +2 physical assistance for bed mobility; able to sit EOB ~5 min with mod-max A for sitting balance. Continue to recommend return to SNF. Will continue to follow acutely.      Follow Up Recommendations  SNF;Supervision/Assistance - 24 hour     Equipment Recommendations  None recommended by PT    Recommendations for Other Services       Precautions / Restrictions Precautions Precautions: Fall Precaution Comments: Advanced dementia Restrictions Weight Bearing Restrictions: Yes RLE Weight Bearing: Weight bearing as tolerated Other Position/Activity Restrictions: transfers only    Mobility  Bed Mobility Overal bed mobility: Needs Assistance Bed Mobility: Rolling;Supine to Sit;Sit to Supine(pt kept eyes closed during mobility despite being alert. ) Rolling: Max assist   Supine to sit: Max assist;HOB elevated;+2 for physical assistance Sit to supine: Max assist;+2 for physical assistance(Max assist for progression of LE off of /onto bed); bed pad utilized to progress pt to EOB.    General bed mobility comments: Large triangle wedge positioned between BLE upon arrival; Pt moans with repositioning RLE  for removal/ repositioning of wedge. Pt unable to follow cues to realign trunk in bed; Lt lateral lean; Total A +2 to pull pt up in bed at beginning and end of treatment.   Transfers                       Balance Overall balance assessment: Needs assistance;History of Falls Sitting-balance support: Feet supported;Single extremity supported Sitting balance-Leahy Scale: Poor Sitting balance - Comments: Pt holding Rt rail with Rt hand with cues; unable to prop Lt UE on bed for support; pt required Mod-Max A to maintain sitting balance.  Postural control: Posterior lean;Left lateral lean                                  Cognition Arousal/Alertness: Awake/alert(Pt kept eyes closed during most of treatment, despite cues. ) Behavior During Therapy: Anxious(Pt kept eyes closed during most of session) Overall Cognitive Status: History of cognitive impairments - at baseline  Pt oriented to self and birthday, that she is in [place], believes it it Oct 1999, and states she is in a hospital type place. Unaware of situation.                                General Comments: pt pleasant with PTA, anxious and restless about mobility requiring increased time and max verbal and tactile cuing for mobility.  Pt's caregiver left room shortly after arrival.       Exercises General Exercises - Upper Extremity Shoulder  Flexion: AAROM;Both;5 reps;Supine General Exercises - Lower Extremity Ankle Circles/Pumps: Left;10 reps;Supine Long Arc Quad: Seated;Left;AROM;5 reps Heel Slides: PROM;Right;15 reps;AROM;Left;10 reps;Supine(pt resistant to assistance to move LE. ) Hip ABduction/ADduction: PROM;Right;5 reps(gentle )    General Comments General comments (skin integrity, edema, etc.): Caregiver present at end of session; aware to keep wedge placed correctly to avoid further pressure on Rt ankle/heel.       Pertinent Vitals/Pain Pain Assessment: Faces Faces Pain Scale:  Hurts even more Pain Location: R LE during attempts at repositioning and bed mobility Pain Descriptors / Indicators: Grimacing;Guarding;Moaning Pain Intervention(s): Limited activity within patient's tolerance;Monitored during session;Repositioned    Home Living                      Prior Function            PT Goals (current goals can now be found in the care plan section) Acute Rehab PT Goals Patient Stated Goal: none stated PT Goal Formulation: Patient unable to participate in goal setting Time For Goal Achievement: 01/10/19 Potential to Achieve Goals: Fair    Frequency    Min 2X/week      PT Plan Current plan remains appropriate    Co-evaluation PT/OT/SLP Co-Evaluation/Treatment: Yes            AM-PAC PT "6 Clicks" Mobility   Outcome Measure  Help needed turning from your back to your side while in a flat bed without using bedrails?: Total Help needed moving from lying on your back to sitting on the side of a flat bed without using bedrails?: Total Help needed moving to and from a bed to a chair (including a wheelchair)?: Total Help needed standing up from a chair using your arms (e.g., wheelchair or bedside chair)?: Total Help needed to walk in hospital room?: Total Help needed climbing 3-5 steps with a railing? : Total 6 Click Score: 6    End of Session   Activity Tolerance: Patient limited by pain Patient left: with call bell/phone within reach;with family/visitor present;in bed;with bed alarm set(caregiver [name] at bedside) Nurse Communication: Mobility status;Other (comment)(needs new linens, requires squat pivot back to bed) PT Visit Diagnosis: Other abnormalities of gait and mobility (R26.89);Pain Pain - Right/Left: Right Pain - part of body: Hip     Time: 1022-1058 PT Time Calculation (min) (ACUTE ONLY): 36 min  Charges:  $Therapeutic Exercise: 8-22 mins $Therapeutic Activity: 8-22 mins                    Kerin Perna,  PTA 01/06/19 11:40 AM

## 2019-01-07 DIAGNOSIS — D72829 Elevated white blood cell count, unspecified: Secondary | ICD-10-CM

## 2019-01-07 DIAGNOSIS — R278 Other lack of coordination: Secondary | ICD-10-CM | POA: Diagnosis not present

## 2019-01-07 DIAGNOSIS — S72009A Fracture of unspecified part of neck of unspecified femur, initial encounter for closed fracture: Secondary | ICD-10-CM | POA: Diagnosis not present

## 2019-01-07 DIAGNOSIS — M79651 Pain in right thigh: Secondary | ICD-10-CM | POA: Diagnosis not present

## 2019-01-07 DIAGNOSIS — W19XXXD Unspecified fall, subsequent encounter: Secondary | ICD-10-CM | POA: Diagnosis not present

## 2019-01-07 DIAGNOSIS — F909 Attention-deficit hyperactivity disorder, unspecified type: Secondary | ICD-10-CM | POA: Diagnosis not present

## 2019-01-07 DIAGNOSIS — F329 Major depressive disorder, single episode, unspecified: Secondary | ICD-10-CM | POA: Diagnosis not present

## 2019-01-07 DIAGNOSIS — F331 Major depressive disorder, recurrent, moderate: Secondary | ICD-10-CM | POA: Diagnosis not present

## 2019-01-07 DIAGNOSIS — D509 Iron deficiency anemia, unspecified: Secondary | ICD-10-CM | POA: Diagnosis not present

## 2019-01-07 DIAGNOSIS — M9701XA Periprosthetic fracture around internal prosthetic right hip joint, initial encounter: Secondary | ICD-10-CM | POA: Diagnosis not present

## 2019-01-07 DIAGNOSIS — F419 Anxiety disorder, unspecified: Secondary | ICD-10-CM | POA: Diagnosis not present

## 2019-01-07 DIAGNOSIS — R2681 Unsteadiness on feet: Secondary | ICD-10-CM | POA: Diagnosis not present

## 2019-01-07 DIAGNOSIS — R41841 Cognitive communication deficit: Secondary | ICD-10-CM | POA: Diagnosis not present

## 2019-01-07 DIAGNOSIS — M79661 Pain in right lower leg: Secondary | ICD-10-CM | POA: Diagnosis not present

## 2019-01-07 DIAGNOSIS — E7849 Other hyperlipidemia: Secondary | ICD-10-CM | POA: Diagnosis not present

## 2019-01-07 DIAGNOSIS — F0391 Unspecified dementia with behavioral disturbance: Secondary | ICD-10-CM | POA: Diagnosis not present

## 2019-01-07 DIAGNOSIS — D6489 Other specified anemias: Secondary | ICD-10-CM | POA: Diagnosis not present

## 2019-01-07 DIAGNOSIS — M25551 Pain in right hip: Secondary | ICD-10-CM | POA: Diagnosis not present

## 2019-01-07 DIAGNOSIS — G9340 Encephalopathy, unspecified: Secondary | ICD-10-CM | POA: Diagnosis not present

## 2019-01-07 DIAGNOSIS — M6281 Muscle weakness (generalized): Secondary | ICD-10-CM | POA: Diagnosis not present

## 2019-01-07 DIAGNOSIS — Z96649 Presence of unspecified artificial hip joint: Secondary | ICD-10-CM | POA: Diagnosis not present

## 2019-01-07 DIAGNOSIS — E785 Hyperlipidemia, unspecified: Secondary | ICD-10-CM | POA: Diagnosis not present

## 2019-01-07 DIAGNOSIS — M978XXD Periprosthetic fracture around other internal prosthetic joint, subsequent encounter: Secondary | ICD-10-CM | POA: Diagnosis not present

## 2019-01-07 DIAGNOSIS — G9349 Other encephalopathy: Secondary | ICD-10-CM | POA: Diagnosis not present

## 2019-01-07 DIAGNOSIS — M25561 Pain in right knee: Secondary | ICD-10-CM | POA: Diagnosis not present

## 2019-01-07 DIAGNOSIS — L89626 Pressure-induced deep tissue damage of left heel: Secondary | ICD-10-CM | POA: Diagnosis not present

## 2019-01-07 DIAGNOSIS — N390 Urinary tract infection, site not specified: Secondary | ICD-10-CM | POA: Diagnosis not present

## 2019-01-07 DIAGNOSIS — Z20828 Contact with and (suspected) exposure to other viral communicable diseases: Secondary | ICD-10-CM | POA: Diagnosis not present

## 2019-01-07 DIAGNOSIS — J449 Chronic obstructive pulmonary disease, unspecified: Secondary | ICD-10-CM | POA: Diagnosis not present

## 2019-01-07 DIAGNOSIS — M9701XD Periprosthetic fracture around internal prosthetic right hip joint, subsequent encounter: Secondary | ICD-10-CM | POA: Diagnosis not present

## 2019-01-07 DIAGNOSIS — L89616 Pressure-induced deep tissue damage of right heel: Secondary | ICD-10-CM | POA: Diagnosis not present

## 2019-01-07 DIAGNOSIS — R6510 Systemic inflammatory response syndrome (SIRS) of non-infectious origin without acute organ dysfunction: Secondary | ICD-10-CM | POA: Diagnosis not present

## 2019-01-07 LAB — BASIC METABOLIC PANEL
Anion gap: 9 (ref 5–15)
BUN: 8 mg/dL (ref 8–23)
CO2: 20 mmol/L — ABNORMAL LOW (ref 22–32)
Calcium: 7.9 mg/dL — ABNORMAL LOW (ref 8.9–10.3)
Chloride: 109 mmol/L (ref 98–111)
Creatinine, Ser: 0.49 mg/dL (ref 0.44–1.00)
GFR calc Af Amer: >60 mL/min (ref >60)
GFR calc non Af Amer: >60 mL/min (ref >60)
Glucose, Bld: 101 mg/dL — ABNORMAL HIGH (ref 70–99)
Potassium: 3.8 mmol/L (ref 3.5–5.1)
Sodium: 138 mmol/L (ref 135–145)

## 2019-01-07 LAB — CBC
HCT: 28.2 % — ABNORMAL LOW (ref 36.0–46.0)
Hemoglobin: 9 g/dL — ABNORMAL LOW (ref 12.0–15.0)
MCH: 29.5 pg (ref 26.0–34.0)
MCHC: 31.9 g/dL (ref 30.0–36.0)
MCV: 92.5 fL (ref 80.0–100.0)
Platelets: 524 10*3/uL — ABNORMAL HIGH (ref 150–400)
RBC: 3.05 MIL/uL — ABNORMAL LOW (ref 3.87–5.11)
RDW: 14.1 % (ref 11.5–15.5)
WBC: 10.8 10*3/uL — ABNORMAL HIGH (ref 4.0–10.5)
nRBC: 0 % (ref 0.0–0.2)

## 2019-01-07 NOTE — Progress Notes (Signed)
Discharge instructions given to [place] @ [facility].  Denied any further questions.  Sutures were removed as ordered, from right hip incision, no bleeding or difficulties noted.

## 2019-01-07 NOTE — TOC Transition Note (Signed)
Transition of Care [facility]) - CM/SW Discharge Note   Patient Details  Name: Desiree Salinas MRN: [id number] Date of Birth: 07/07/1950  [facility]) CM/SW Contact:  Atilano Median, LCSW Phone Number: 01/07/2019, 1:30 PM   Clinical Narrative:    Discharged to SNF. Referral coordinated with Opal Sidles [phone]. Patient's sister [name] [phone] aware and agreeable to this plan. Number to call report given to unit RN Doroteo Bradford. No other needs at this time. Case closed to this CSW.   Number to call report [place] [phone]    Final next level of care: Skilled Nursing Facility Barriers to Discharge: Barriers Resolved   Patient Goals and CMS Choice   CMS Medicare.gov Compare Post Acute Care list provided to:: Patient Represenative (must comment)(sister [name]) Choice offered to / list presented to : [place] / [place]  Discharge Placement PASRR number recieved: 01/05/19            Patient chooses bed at: [facility] Patient to be transferred to facility by: [place] Name of family member notified: [name] Patient and family notified of of transfer: 01/07/19  Discharge Plan and Services In-house Referral: Clinical Social Work                  Social Determinants of Health ([place]) Interventions     Readmission Risk Interventions Readmission Risk Prevention Plan 11/28/2018  Transportation Screening Complete  PCP or Specialist Appt within 3-5 Days Not Complete  Not Complete comments Transitioning to [place] Unit  [place] or Home Care Consult Complete  Social Work Consult for [place] Planning/Counseling Complete  Palliative Care Screening Not Applicable  Medication Review Press photographer) Complete  Some recent data might be hidden

## 2019-01-07 NOTE — Discharge Summary (Signed)
Physician Discharge Summary  SARADA DEVANNEY [id number] DOB: April 26, 1950 DOA: 12/24/2018  PCP: [name], MD  Admit date: 12/24/2018 Discharge date: 01/07/2019  Admitted From: SNF Disposition: SNF  Recommendations for Outpatient Follow-up:  1. Follow up with SNF provider at earliest convenience 2. Outpatient follow-up with orthopedics.  Activity/wound care as per orthopedics recommendations. 3. Follow up in ED if symptoms worsen or new appear   Home Health: No Equipment/Devices: None  Discharge Condition: Guarded  CODE STATUS: Full Diet recommendation: Heart healthy  Brief/Interim Summary: 68 year old female with history of dementia, anemia, anxiety, arthritis, COPD, depression, hyperlipidemia, recent hospitalization from 11/25/2018-11/23/2022 intertrochanteric right hip fracture status post intramedullary nailing by Dr. Sharol Given on 11/26/2018 presented with increasing right hip pain.  She was found to have right hip periprosthetic fracture and underwent ORIF on 12/26/2018.  During the hospitalization, she was started on IV cefepime for possible UTI.  She had some encephalopathy.  This has resolved.  She will be discharged to SNF once bed is available.  Discharge Diagnoses:   Right hip periprosthetic fracture -Status post ORIF on 12/26/2018. -Activity/wound care as per Dr. Jess Barters recommendations.  Outpatient follow-up with Dr. Sharol Given. -Fall precautions  SIRS Possible UTI -Patient was started on intravenous cefepime. -Current hemodynamically stable.  Cultures negative so far.  No need for any more antibiotics on discharge.  Leukocytosis -Resolved.  Acute metabolic and drug-induced encephalopathy -Improving.  BuSpar has been held.  Continue to hold BuSpar on discharge. -Mental status is probably back to her baseline.  Dementia with aggressive behavior -Continue Seroquel at bedtime and Seroquel as needed.  BuSpar plan as above  COPD -Stable.  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 Follow up with Dr. Sharol Given as directed. Follow up with PCP in 7-10 days.   Increase activity slowly   Complete by: As directed      Allergies as of 01/07/2019      Reactions   Latex Other (See Comments)   "Allergic," per MAR   Nickel Hives, Itching, Other (See Comments)   Weeping, also   Other Other (See Comments)   Patient is "Allergic to metals," per MAR   Cobalt Rash   Eucerin [basis Facial Moisturizer] Hives, Itching, Rash, Other (See Comments)   Eucerin lotion causes an allergic skin reaction      Medication List    STOP taking these medications   busPIRone 10 MG tablet Commonly known as: BUSPAR   HYDROcodone-acetaminophen 7.5-325 MG tablet Commonly known as: NORCO Replaced by: HYDROcodone-acetaminophen 5-325 MG tablet   oxyCODONE 5 MG immediate release tablet Commonly known as: Oxy IR/ROXICODONE   pindolol 10 MG tablet Commonly known as: VISKEN     TAKE these medications   acetaminophen 325 MG tablet Commonly known as: TYLENOL Take 325 mg by mouth See admin instructions. Take 325  mg by mouth two times a day and an additional 325 mg every six hours as needed for headaches, discomfort, of fevers 99.5-101 F and call MD for a fever >101 F; not to exceed 2,000 mg/24 hours of Tylenol from ALL sources   amphetamine-dextroamphetamine 15 MG 24 hr capsule Commonly known as:  ADDERALL XR Take 15 mg by mouth daily.   aspirin 325 MG EC tablet Take 1 tablet (325 mg total) by mouth daily with breakfast.   atorvastatin 40 MG tablet Commonly known as: LIPITOR TAKE 1 BY MOUTH DAILY What changed:   how much to take  how to take this  when to take this  additional instructions   bisacodyl 5 MG EC tablet Commonly known as: DULCOLAX Take 1 tablet (5 mg total) by mouth daily as needed for moderate constipation.   docusate sodium 100 MG capsule Commonly known as: COLACE Take 1 capsule (100 mg total) by mouth 2 (two) times daily.   escitalopram 20 MG tablet Commonly known as: LEXAPRO Take 20 mg by mouth daily.   feeding supplement (ENSURE ENLIVE) Liqd Take 237 mLs by mouth 2 (two) times daily between meals.   ferrous sulfate 325 (65 FE) MG tablet Take 1 tablet (325 mg total) by mouth daily with breakfast.   HYDROcodone-acetaminophen 5-325 MG tablet Commonly known as: NORCO/VICODIN Take 1-2 tablets by mouth every 4 (four) hours as needed for moderate pain (pain score 4-6). Replaces: HYDROcodone-acetaminophen 7.5-325 MG tablet   Melatonin 5 MG Tabs Take 5 mg by mouth at bedtime.   Mi-Acid 200-200-20 MG/5ML suspension Generic drug: alum & mag hydroxide-simeth Take 30 mLs by mouth every 6 (six) hours as needed for indigestion or heartburn.   Milk of Magnesia 400 MG/5ML suspension Generic drug: magnesium hydroxide Take 30 mLs by mouth at bedtime as needed for mild constipation.   neomycin-bacitracin-polymyxin [phone] ointment Apply 1 application topically as needed (to affected areas, for skin tears or abrasions).   pantoprazole 40 MG tablet Commonly known as: PROTONIX Take 40 mg by mouth daily.   QUEtiapine 100 MG tablet Commonly known as: SEROQUEL Take 1 tablet (100 mg total) by mouth at bedtime.   QUEtiapine 25 MG tablet Commonly known as: SEROQUEL Take 1 tablet (25 mg total) by mouth 3 (three) times daily as needed (for agitation).    Tussin 100 MG/5ML liquid Generic drug: guaiFENesin Take 200 mg by mouth every 6 (six) hours as needed for cough.   Vitamin B Complex Tabs Take 1 tablet by mouth daily. What changed: Another medication with the same name was removed. Continue taking this medication, and follow the directions you see here.   vitamin C 500 MG tablet Commonly known as: ASCORBIC ACID Take 500 mg by mouth daily.            Durable Medical Equipment  (From admission, onward)         Start     Ordered   12/26/18 1344  For home use only DME standard manual wheelchair with seat cushion  Once    Comments: Patient suffers from right hip and femur fractures and baseline dementia, which impairs their ability to perform daily activities like bathing, dressing and grooming in the home.  A walker will not resolve issue with performing activities of daily living. A wheelchair will allow patient to safely perform daily activities. Patient can safely propel the wheelchair in the home or has a caregiver who can provide assistance. Length of need Lifetime. Accessories: elevating leg rests (ELRs),  wheel locks, extensions and anti-tippers.   12/26/18 1343   12/26/18 1344  For home use only DME standard manual wheelchair with seat cushion  Once    Comments: Patient suffers from right femur/hip fractures/dementia which impairs their ability to perform daily activities like bathing, dressing, feeding, grooming and toileting in the home.  A walker will not resolve issue with performing activities of daily living. A wheelchair will allow patient to safely perform daily activities. Patient can safely propel the wheelchair in the home or has a caregiver who can provide assistance. Length of need Lifetime. Accessories: elevating leg rests (ELRs), wheel locks, extensions and anti-tippers.   12/26/18 1343         Follow-up Information    Follow up In 1 week.        Newt Minion, MD Follow up in 2 week(s).   Specialty:  Orthopedic Surgery Contact information: [address] [place] [address] [phone]          Allergies  Allergen Reactions  . Latex Other (See Comments)    "Allergic," per MAR  . Nickel Hives, Itching and Other (See Comments)    Weeping, also  . Other Other (See Comments)    Patient is "Allergic to metals," per MAR  . Cobalt Rash  . Eucerin [Basis Facial Moisturizer] Hives, Itching, Rash and Other (See Comments)    Eucerin lotion causes an allergic skin reaction    Consultations:  Orthopedics   Procedures/Studies: Dg Chest Port 1 View  Result Date: 01/04/2019 CLINICAL DATA:  Systemic inflammatory response syndrome EXAM: PORTABLE CHEST 1 VIEW COMPARISON:  Portable exam 1044 hours compared to 12/24/2018 FINDINGS: Normal heart size, mediastinal contours, and pulmonary vascularity. Atherosclerotic calcification aorta. Rotated to the LEFT. Calcified granulomata RIGHT mid lung. No infiltrate, pleural effusion or pneumothorax. Bones demineralized. IMPRESSION: No acute abnormalities. Electronically Signed   By: Lavonia Dana M.D.   On: 01/04/2019 15:06   Chest Portable 1 View  Result Date: 12/24/2018 CLINICAL DATA:  Leukocytosis EXAM: PORTABLE CHEST 1 VIEW COMPARISON:  Chest radiograph dated 11/25/2018 FINDINGS: The heart size is normal. Vascular calcifications are seen in the aortic arch. Both lungs are clear. The visualized skeletal structures are unremarkable. IMPRESSION: No active disease. Electronically Signed   By: Zerita Boers M.D.   On: 12/24/2018 20:50   Dg C-arm 1-60 Min-no Report  Result Date: 12/26/2018 Fluoroscopy was utilized by the requesting physician.  No radiographic interpretation.   Dg Hip Unilat  With Pelvis 2-3 Views Right  Result Date: 12/24/2018 CLINICAL DATA:  Hip pain right side EXAM: DG HIP (WITH OR WITHOUT PELVIS) 2-3V RIGHT COMPARISON:  12/08/2018, 11/25/2018 FINDINGS: SI joints are non widened. Pubic symphysis intact. Questionable  nondisplaced fracture lucency versus soft tissue artifact over the right inferior pubic ramus. Intramedullary rod and distal screw fixation of previous comminuted right intertrochanteric fracture with displaced lesser trochanteric fracture fragment. Acute periprosthetic fracture involving the proximal shaft of the right femur with about 1 shaft diameter of medial displacement of distal fracture fragment. IMPRESSION: 1. Previous intramedullary rod and screw fixation of comminuted intertrochanteric fracture, now with acute displaced periprosthetic fracture involving the proximal shaft of the femur. No femoral head dislocation. Electronically Signed   By: Donavan Foil M.D.   On: 12/24/2018 16:32       Subjective: Patient seen and examined at bedside.  She is awake, poor historian.  No overnight fever or vomiting reported.  Discharge Exam: Vitals:   01/06/19 2200 01/07/19 0446  BP: (!) 99/49 112/64  Pulse: 80 83  Resp:  16  Temp:  98.3 F (36.8 C)  SpO2:  100%    General: Pt is  awake, not in acute distress.  Poor historian.  Slightly confused. Cardiovascular: rate controlled, S1/S2 + Respiratory: bilateral decreased breath sounds at bases Abdominal: Soft, NT, ND, bowel sounds + Extremities: no edema, no cyanosis    The results of significant diagnostics from this hospitalization (including imaging, microbiology, ancillary and laboratory) are listed below for reference.     Microbiology: Recent Results (from the past 240 hour(s))  SARS Coronavirus 2 by RT PCR (hospital order, performed in [facility] hospital lab) Nasopharyngeal Nasopharyngeal Swab     Status: None   Collection Time: 12/29/18 10:25 AM   Specimen: Nasopharyngeal Swab  Result Value Ref Range Status   SARS Coronavirus 2 NEGATIVE NEGATIVE Final    Comment: (NOTE) If result is NEGATIVE SARS-CoV-2 target nucleic acids are NOT DETECTED. The SARS-CoV-2 RNA is generally detectable in upper and lower  respiratory  specimens during the acute phase of infection. The lowest  concentration of SARS-CoV-2 viral copies this assay can detect is 250  copies / mL. A negative result does not preclude SARS-CoV-2 infection  and should not be used as the sole basis for treatment or other  patient management decisions.  A negative result may occur with  improper specimen collection / handling, submission of specimen other  than nasopharyngeal swab, presence of viral mutation(s) within the  areas targeted by this assay, and inadequate number of viral copies  (<250 copies / mL). A negative result must be combined with clinical  observations, patient history, and epidemiological information. If result is POSITIVE SARS-CoV-2 target nucleic acids are DETECTED. The SARS-CoV-2 RNA is generally detectable in upper and lower  respiratory specimens dur ing the acute phase of infection.  Positive  results are indicative of active infection with SARS-CoV-2.  Clinical  correlation with patient history and other diagnostic information is  necessary to determine patient infection status.  Positive results do  not rule out bacterial infection or co-infection with other viruses. If result is PRESUMPTIVE POSTIVE SARS-CoV-2 nucleic acids MAY BE PRESENT.   A presumptive positive result was obtained on the submitted specimen  and confirmed on repeat testing.  While 2019 novel coronavirus  (SARS-CoV-2) nucleic acids may be present in the submitted sample  additional confirmatory testing may be necessary for epidemiological  and / or clinical management purposes  to differentiate between  SARS-CoV-2 and other Sarbecovirus currently known to infect humans.  If clinically indicated additional testing with an alternate test  methodology [phone]) is advised. The SARS-CoV-2 RNA is generally  detectable in upper and lower respiratory sp ecimens during the acute  phase of infection. The expected result is Negative. Fact Sheet for  Patients:  StrictlyIdeas.no Fact Sheet for Healthcare Providers: BankingDealers.co.za This test is not yet approved or cleared by the [place] FDA and has been authorized for detection and/or diagnosis of SARS-CoV-2 by FDA under an Emergency Use Authorization (EUA).  This EUA will remain in effect (meaning this test can be used) for the duration of the COVID-19 declaration under Section 564(b)(1) of the Act, 21 U.S.C. section 360bbb-3(b)(1), unless the authorization is terminated or revoked sooner. Performed at [facility], [place] [address], [place], [place] [address]   Culture, blood (routine x 2)     Status: None (Preliminary result)   Collection Time: 01/04/19 11:01 AM   Specimen: BLOOD  RIGHT WRIST  Result Value Ref Range Status   Specimen Description BLOOD RIGHT WRIST  Final   Special Requests   Final    BOTTLES DRAWN AEROBIC ONLY Blood Culture results may not be optimal due to an inadequate volume of blood received in culture bottles   Culture   Final    NO GROWTH 3 DAYS Performed at [facility], [place] [address], [place], [place] [address]    Report Status PENDING  Incomplete  Culture, blood (routine x 2)     Status: None (Preliminary result)   Collection Time: 01/04/19 11:09 AM   Specimen: BLOOD RIGHT HAND  Result Value Ref Range Status   Specimen Description BLOOD RIGHT HAND  Final   Special Requests   Final    BOTTLES DRAWN AEROBIC AND ANAEROBIC Blood Culture results may not be optimal due to an inadequate volume of blood received in culture bottles   Culture   Final    NO GROWTH 3 DAYS Performed at [facility], [place] [address], [place], [place] [address]    Report Status PENDING  Incomplete  Culture, Urine     Status: None   Collection Time: 01/04/19  4:29 PM   Specimen: Urine, Random  Result Value Ref Range Status   Specimen Description URINE, RANDOM  Final   Special Requests NONE  Final   Culture    Final    NO GROWTH Performed at [facility], [place] [address] [place], [place] [address]    Report Status 01/05/2019 FINAL  Final  SARS CORONAVIRUS 2 (TAT 6-24 HRS) Nasopharyngeal Nasopharyngeal Swab     Status: None   Collection Time: 01/06/19  4:02 PM   Specimen: Nasopharyngeal Swab  Result Value Ref Range Status   SARS Coronavirus 2 NEGATIVE NEGATIVE Final    Comment: (NOTE) SARS-CoV-2 target nucleic acids are NOT DETECTED. The SARS-CoV-2 RNA is generally detectable in upper and lower respiratory specimens during the acute phase of infection. Negative results do not preclude SARS-CoV-2 infection, do not rule out co-infections with other pathogens, and should not be used as the sole basis for treatment or other patient management decisions. Negative results must be combined with clinical observations, patient history, and epidemiological information. The expected result is Negative. Fact Sheet for Patients: SugarRoll.be Fact Sheet for Healthcare Providers: https://www.woods-mathews.com/ This test is not yet approved or cleared by the [place] FDA and  has been authorized for detection and/or diagnosis of SARS-CoV-2 by FDA under an Emergency Use Authorization (EUA). This EUA will remain  in effect (meaning this test can be used) for the duration of the COVID-19 declaration under Section 56 4(b)(1) of the Act, 21 U.S.C. section 360bbb-3(b)(1), unless the authorization is terminated or revoked sooner. Performed at [facility], [place] [address], [place],  [address]      Labs: BNP (last 3 results) No results for input(s): BNP in the last 8760 hours. Basic Metabolic Panel: Recent Labs  Lab 01/02/19 0435 01/04/19 1110 01/05/19 0949 01/06/19 0556 01/07/19 0356  NA 139 136 134* 137 138  K 3.8 3.6 3.7 3.4* 3.8  CL 107 104 104 105 109  CO2 23 19* 22 20* 20*  GLUCOSE 98 107* 134* 100* 101*  BUN 10 12 12  7* 8   CREATININE 0.61 0.58 0.66 0.55 0.49  CALCIUM 8.3* 8.2* 7.7* 7.8* 7.9*   Liver Function Tests: No results for input(s): AST, ALT, ALKPHOS, BILITOT, PROT, ALBUMIN in the last 168 hours.  No results for input(s): LIPASE, AMYLASE in the last 168 hours. Recent Labs  Lab 01/05/19 1242  AMMONIA 20   CBC: Recent Labs  Lab 01/01/19 0345 01/02/19 0435 01/04/19 1110 01/05/19 0949 01/06/19 0556 01/07/19 0356  WBC 8.9 10.9* 16.2* 14.5* 10.6* 10.8*  NEUTROABS 5.7 7.6  --   --   --   --   HGB 8.0* 8.3* 9.4* 9.1* 9.3* 9.0*  HCT 25.3* 26.7* 30.0* 28.7* 29.2* 28.2*  MCV 93.0 94.3 93.8 93.5 92.7 92.5  PLT 423* 542* 555* 543* 421* 524*   Cardiac Enzymes: No results for input(s): CKTOTAL, CKMB, CKMBINDEX, TROPONINI in the last 168 hours. BNP: Invalid input(s): POCBNP CBG: No results for input(s): GLUCAP in the last 168 hours. D-Dimer No results for input(s): DDIMER in the last 72 hours. Hgb A1c No results for input(s): HGBA1C in the last 72 hours. Lipid Profile No results for input(s): CHOL, HDL, LDLCALC, TRIG, CHOLHDL, LDLDIRECT in the last 72 hours. Thyroid function studies No results for input(s): TSH, T4TOTAL, T3FREE, THYROIDAB in the last 72 hours.  Invalid input(s): FREET3 Anemia work up No results for input(s): VITAMINB12, FOLATE, FERRITIN, TIBC, IRON, RETICCTPCT in the last 72 hours. Urinalysis    Component Value Date/Time   COLORURINE YELLOW 01/04/2019 1624   APPEARANCEUR HAZY (A) 01/04/2019 1624   APPEARANCEUR Clear 06/11/2018 1120   LABSPEC 1.028 01/04/2019 1624   PHURINE 5.0 01/04/2019 1624   GLUCOSEU NEGATIVE 01/04/2019 1624   GLUCOSEU NEGATIVE 08/14/2011 0815   HGBUR NEGATIVE 01/04/2019 1624   BILIRUBINUR NEGATIVE 01/04/2019 1624   BILIRUBINUR Negative 06/11/2018 1120   KETONESUR NEGATIVE 01/04/2019 1624   PROTEINUR NEGATIVE 01/04/2019 1624   UROBILINOGEN 0.2 12/30/2013 1248   NITRITE NEGATIVE 01/04/2019 1624   LEUKOCYTESUR SMALL (A) 01/04/2019 1624   Sepsis  Labs Invalid input(s): PROCALCITONIN,  WBC,  LACTICIDVEN Microbiology Recent Results (from the past 240 hour(s))  SARS Coronavirus 2 by RT PCR (hospital order, performed in [facility] hospital lab) Nasopharyngeal Nasopharyngeal Swab     Status: None   Collection Time: 12/29/18 10:25 AM   Specimen: Nasopharyngeal Swab  Result Value Ref Range Status   SARS Coronavirus 2 NEGATIVE NEGATIVE Final    Comment: (NOTE) If result is NEGATIVE SARS-CoV-2 target nucleic acids are NOT DETECTED. The SARS-CoV-2 RNA is generally detectable in upper and lower  respiratory specimens during the acute phase of infection. The lowest  concentration of SARS-CoV-2 viral copies this assay can detect is 250  copies / mL. A negative result does not preclude SARS-CoV-2 infection  and should not be used as the sole basis for treatment or other  patient management decisions.  A negative result may occur with  improper specimen collection / handling, submission of specimen other  than nasopharyngeal swab, presence of viral mutation(s) within the  areas targeted by this assay, and inadequate number of viral copies  (<250 copies / mL). A negative result must be combined with clinical  observations, patient history, and epidemiological information. If result is POSITIVE SARS-CoV-2 target nucleic acids are DETECTED. The SARS-CoV-2 RNA is generally detectable in upper and lower  respiratory specimens dur ing the acute phase of infection.  Positive  results are indicative of active infection with SARS-CoV-2.  Clinical  correlation with patient history and other diagnostic information is  necessary to determine patient infection status.  Positive results do  not rule out bacterial infection or co-infection with other viruses. If result is PRESUMPTIVE POSTIVE SARS-CoV-2 nucleic acids MAY BE PRESENT.  A presumptive positive result was obtained on the submitted specimen  and confirmed on repeat testing.  While 2019 novel  coronavirus  (SARS-CoV-2) nucleic acids may be present in the submitted sample  additional confirmatory testing may be necessary for epidemiological  and / or clinical management purposes  to differentiate between  SARS-CoV-2 and other Sarbecovirus currently known to infect humans.  If clinically indicated additional testing with an alternate test  methodology [phone]) is advised. The SARS-CoV-2 RNA is generally  detectable in upper and lower respiratory sp ecimens during the acute  phase of infection. The expected result is Negative. Fact Sheet for Patients:  StrictlyIdeas.no Fact Sheet for Healthcare Providers: BankingDealers.co.za This test is not yet approved or cleared by the [place] FDA and has been authorized for detection and/or diagnosis of SARS-CoV-2 by FDA under an Emergency Use Authorization (EUA).  This EUA will remain in effect (meaning this test can be used) for the duration of the COVID-19 declaration under Section 564(b)(1) of the Act, 21 U.S.C. section 360bbb-3(b)(1), unless the authorization is terminated or revoked sooner. Performed at [facility], [place] [address] [place], [place] [address]   Culture, blood (routine x 2)     Status: None (Preliminary result)   Collection Time: 01/04/19 11:01 AM   Specimen: BLOOD RIGHT WRIST  Result Value Ref Range Status   Specimen Description BLOOD RIGHT WRIST  Final   Special Requests   Final    BOTTLES DRAWN AEROBIC ONLY Blood Culture results may not be optimal due to an inadequate volume of blood received in culture bottles   Culture   Final    NO GROWTH 3 DAYS Performed at [facility], [place] [address], [place], [place] [address]    Report Status PENDING  Incomplete  Culture, blood (routine x 2)     Status: None (Preliminary result)   Collection Time: 01/04/19 11:09 AM   Specimen: BLOOD RIGHT HAND  Result Value Ref Range Status   Specimen Description  BLOOD RIGHT HAND  Final   Special Requests   Final    BOTTLES DRAWN AEROBIC AND ANAEROBIC Blood Culture results may not be optimal due to an inadequate volume of blood received in culture bottles   Culture   Final    NO GROWTH 3 DAYS Performed at [facility], [place] [address] [place], [place] [address]    Report Status PENDING  Incomplete  Culture, Urine     Status: None   Collection Time: 01/04/19  4:29 PM   Specimen: Urine, Random  Result Value Ref Range Status   Specimen Description URINE, RANDOM  Final   Special Requests NONE  Final   Culture   Final    NO GROWTH Performed at [facility], [place] [address], [place], [place] [address]    Report Status 01/05/2019 FINAL  Final  SARS CORONAVIRUS 2 (TAT 6-24 HRS) Nasopharyngeal Nasopharyngeal Swab     Status: None   Collection Time: 01/06/19  4:02 PM   Specimen: Nasopharyngeal Swab  Result Value Ref Range Status   SARS Coronavirus 2 NEGATIVE NEGATIVE Final    Comment: (NOTE) SARS-CoV-2 target nucleic acids are NOT DETECTED. The SARS-CoV-2 RNA is generally detectable in upper and lower respiratory specimens during the acute phase of infection. Negative results do not preclude SARS-CoV-2 infection, do not rule out co-infections with other pathogens, and should not be used as the sole basis for treatment or other patient management decisions. Negative results must  be combined with clinical observations, patient history, and epidemiological information. The expected result is Negative. Fact Sheet for Patients: SugarRoll.be Fact Sheet for Healthcare Providers: https://www.woods-mathews.com/ This test is not yet approved or cleared by the [place] FDA and  has been authorized for detection and/or diagnosis of SARS-CoV-2 by FDA under an Emergency Use Authorization (EUA). This EUA will remain  in effect (meaning this test can be used) for the duration of the COVID-19 declaration  under Section 56 4(b)(1) of the Act, 21 U.S.C. section 360bbb-3(b)(1), unless the authorization is terminated or revoked sooner. Performed at [facility], [place] [address] [place], [place] [address]      Time coordinating discharge: 35 minutes  SIGNED:   Aline August, MD  [facility] 01/07/2019, 1:09 PM

## 2019-01-07 NOTE — Plan of Care (Signed)
  Problem: Education: Goal: Knowledge of General Education information will improve Description: Including pain rating scale, medication(s)/side effects and non-pharmacologic comfort measures Outcome: Progressing   Problem: Health Behavior/Discharge Planning: Goal: Ability to manage health-related needs will improve Outcome: Progressing   Problem: Nutrition: Goal: Adequate nutrition will be maintained Outcome: Progressing   Problem: Elimination: Goal: Will not experience complications related to bowel motility Outcome: Progressing   Problem: Pain Managment: Goal: General experience of comfort will improve Outcome: Progressing   Problem: Safety: Goal: Ability to remain free from injury will improve Outcome: Progressing   Problem: Skin Integrity: Goal: Risk for impaired skin integrity will decrease Outcome: Progressing

## 2019-01-07 NOTE — NC FL2 (Signed)
[place] LEVEL OF CARE SCREENING TOOL     IDENTIFICATION  Patient Name: Desiree Salinas Birthdate: 20-Oct-1950 Sex: female Admission Date (Current Location): 12/24/2018  [facility] and [place] Number:  Herbalist and Address:  [facility], [place] [address] [place], [place] [address]      Provider Number: [id number]  Attending Physician Name and Address:  Aline August, MD  Relative Name and Phone Number:       Current Level of Care: Hospital Recommended Level of Care: [place] Prior Approval Number:    Date Approved/Denied:   PASRR Number: [id number]  Discharge Plan: SNF    Current Diagnoses: Patient Active Problem List   Diagnosis Date Noted  . Periprosthetic fracture of hip 12/24/2018  . Hip fracture ([place]) 11/26/2018  . Fall at home, initial encounter 11/26/2018  . Leukocytosis 11/26/2018  . COPD (chronic obstructive pulmonary disease) ([place]) 11/26/2018  . Intertrochanteric fracture of right hip ([place]) 11/26/2018  . Closed intertrochanteric fracture of hip, right, initial encounter ([place])   . 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 Iron deficiency anemia 07/15/2018  . Fall 06/09/2018  . Alcoholic dementia ([place]) [id number]  . Degenerative scoliosis 02/10/2018  . Diarrhea 10/14/2013  . Cigarette smoker 08/08/2011  . VAIN I (vaginal intraepithelial neoplasia grade I)   . COLONIC POLYPS 12/21/2007  . ALLERGIC RHINITIS 12/21/2007  . 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 HYPERCHOLESTEROLEMIA 07/05/2007  . 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 DERMATITIS 07/05/2007  . SHINGLES, HX OF 07/05/2007    Orientation RESPIRATION BLADDER Height & Weight     Self  Normal Incontinent, External catheter Weight: 129 lb 13.6 oz (58.9 kg) Height:  5\' 3"  (160 cm)  BEHAVIORAL SYMPTOMS/MOOD NEUROLOGICAL BOWEL NUTRITION STATUS      Continent Diet(see discharge summary)  AMBULATORY STATUS  COMMUNICATION OF NEEDS Skin   Extensive Assist Verbally Surgical wounds(incision on right hip with adhesive bandage)                       Personal Care Assistance Level of Assistance  Dressing, Bathing, Feeding Bathing Assistance: Maximum assistance Feeding assistance: Limited assistance Dressing Assistance: Maximum assistance     Functional Limitations Info  Sight, Speech, Hearing Sight Info: Adequate Hearing Info: Adequate Speech Info: Adequate    SPECIAL CARE FACTORS FREQUENCY  OT (By licensed OT), PT (By licensed PT)     PT Frequency: 5x week OT Frequency: 5x week            Contractures Contractures Info: Not present    Additional Factors Info  Code Status, Allergies, Psychotropic Code Status Info: Full Code Allergies Info: Latex, Nickel, Other, Cobalt, Eucerin (Basis Facial Moisturizer) Psychotropic Info: busPIRone (BUSPAR) tablet 20 mg daily PO; escitalopram (LEXAPRO) tablet 20 mg daily PO; QUEtiapine (SEROQUEL) tablet 100 mg daily at bedtime PO         Current Medications (01/07/2019):  This is the current hospital active medication list Current Facility-Administered Medications  Medication Dose Route Frequency Provider Last Rate Last Dose  . acetaminophen (TYLENOL) tablet 325-650 mg  325-650 mg Oral Q6H PRN RayburnNeta Mends, PA-C   650 mg at 01/04/19 2141  . aspirin EC tablet 325 mg  325 mg Oral Q breakfast Rayburn, Neta Mends, PA-C   325 mg at 01/06/19 0845  . atorvastatin (LIPITOR) tablet 40 mg  40 mg Oral QHS Rayburn, Neta Mends, PA-C   40 mg at 01/06/19 2257  .  docusate sodium (COLACE) capsule 100 mg  100 mg Oral BID Rayburn, Neta Mends, PA-C   100 mg at 01/06/19 1004  . escitalopram (LEXAPRO) tablet 20 mg  20 mg Oral Daily Rayburn, Neta Mends, PA-C   20 mg at 01/06/19 1004  . feeding supplement (ENSURE ENLIVE) (ENSURE ENLIVE) liquid 237 mL  237 mL Oral BID BM Swayze, Ava, DO   237 mL at 01/06/19 1423  . ferrous  sulfate tablet 325 mg  325 mg Oral Q breakfast Donne Hazel, MD   325 mg at 01/06/19 0845  . guaiFENesin (ROBITUSSIN) 100 MG/5ML solution 200 mg  200 mg Oral Q6H PRN Rayburn, Neta Mends, PA-C      . HYDROcodone-acetaminophen (NORCO) 7.5-325 MG per tablet 1-2 tablet  1-2 tablet Oral Q4H PRN Rayburn, Neta Mends, PA-C      . HYDROcodone-acetaminophen (NORCO/VICODIN) 5-325 MG per tablet 1-2 tablet  1-2 tablet Oral Q4H PRN Rayburn, Neta Mends, PA-C   1 tablet at 01/02/19 1030  . ipratropium-albuterol (DUONEB) 0.5-2.5 (3) MG/3ML nebulizer solution 3 mL  3 mL Nebulization Q6H PRN Rayburn, Neta Mends, PA-C      . lactated ringers infusion   Intravenous Continuous Rayburn, Neta Mends, PA-C   Stopped at 12/26/18 1102  . magnesium hydroxide (MILK OF MAGNESIA) suspension 30 mL  30 mL Oral QHS PRN Rayburn, Neta Mends, PA-C      . Melatonin TABS 3 mg  3 mg Oral QHS Rayburn, Neta Mends, PA-C   3 mg at 01/06/19 2254  . methocarbamol (ROBAXIN) tablet 500 mg  500 mg Oral Q6H PRN Rayburn, Neta Mends, PA-C   500 mg at 12/27/18 1048   Or  . methocarbamol (ROBAXIN) 500 mg in dextrose 5 % 50 mL IVPB  500 mg Intravenous Q6H PRN Rayburn, Neta Mends, PA-C      . metoCLOPramide (REGLAN) tablet 5-10 mg  5-10 mg Oral Q8H PRN Rayburn, Neta Mends, PA-C       Or  . metoCLOPramide (REGLAN) injection 5-10 mg  5-10 mg Intravenous Q8H PRN Rayburn, Neta Mends, PA-C      . morphine 2 MG/ML injection 0.5-1 mg  0.5-1 mg Intravenous Q2H PRN Rayburn, Neta Mends, PA-C   1 mg at 12/26/18 1818  . ondansetron (ZOFRAN) tablet 4 mg  4 mg Oral Q6H PRN Rayburn, Neta Mends, PA-C       Or  . ondansetron (ZOFRAN) injection 4 mg  4 mg Intravenous Q6H PRN Rayburn, Neta Mends, PA-C      . pantoprazole (PROTONIX) EC tablet 40 mg  40 mg Oral Daily Rayburn, Neta Mends, PA-C   40 mg at 01/06/19 1004  . QUEtiapine (SEROQUEL) tablet 100 mg  100 mg Oral QHS Blount,  Xenia T, NP   100 mg at 01/05/19 2248  . QUEtiapine (SEROQUEL) tablet 25 mg  25 mg Oral TID PRN Rayburn, Neta Mends, PA-C   25 mg at 12/25/18 2051     Discharge Medications: Please see discharge summary for a list of discharge medications.  Relevant Imaging Results:  Relevant Lab Results:   Additional Information SS# [place] Rosmarie Esquibel, [place]

## 2019-01-08 DIAGNOSIS — F0391 Unspecified dementia with behavioral disturbance: Secondary | ICD-10-CM | POA: Diagnosis not present

## 2019-01-08 DIAGNOSIS — M9701XD Periprosthetic fracture around internal prosthetic right hip joint, subsequent encounter: Secondary | ICD-10-CM | POA: Diagnosis not present

## 2019-01-08 DIAGNOSIS — D6489 Other specified anemias: Secondary | ICD-10-CM | POA: Diagnosis not present

## 2019-01-08 DIAGNOSIS — G9349 Other encephalopathy: Secondary | ICD-10-CM | POA: Diagnosis not present

## 2019-01-09 LAB — CULTURE, BLOOD (ROUTINE X 2)
Culture: NO GROWTH
Culture: NO GROWTH

## 2019-01-11 DIAGNOSIS — R6510 Systemic inflammatory response syndrome (SIRS) of non-infectious origin without acute organ dysfunction: Secondary | ICD-10-CM | POA: Diagnosis not present

## 2019-01-11 DIAGNOSIS — L89626 Pressure-induced deep tissue damage of left heel: Secondary | ICD-10-CM | POA: Diagnosis not present

## 2019-01-11 DIAGNOSIS — F0391 Unspecified dementia with behavioral disturbance: Secondary | ICD-10-CM | POA: Diagnosis not present

## 2019-01-11 DIAGNOSIS — F329 Major depressive disorder, single episode, unspecified: Secondary | ICD-10-CM | POA: Diagnosis not present

## 2019-01-11 DIAGNOSIS — G9349 Other encephalopathy: Secondary | ICD-10-CM | POA: Diagnosis not present

## 2019-01-11 DIAGNOSIS — J449 Chronic obstructive pulmonary disease, unspecified: Secondary | ICD-10-CM | POA: Diagnosis not present

## 2019-01-11 DIAGNOSIS — M9701XD Periprosthetic fracture around internal prosthetic right hip joint, subsequent encounter: Secondary | ICD-10-CM | POA: Diagnosis not present

## 2019-01-11 DIAGNOSIS — L89616 Pressure-induced deep tissue damage of right heel: Secondary | ICD-10-CM | POA: Diagnosis not present

## 2019-01-14 ENCOUNTER — Other Ambulatory Visit: Payer: Self-pay | Admitting: *Deleted

## 2019-01-14 DIAGNOSIS — J449 Chronic obstructive pulmonary disease, unspecified: Secondary | ICD-10-CM | POA: Diagnosis not present

## 2019-01-14 DIAGNOSIS — G9340 Encephalopathy, unspecified: Secondary | ICD-10-CM | POA: Diagnosis not present

## 2019-01-14 DIAGNOSIS — S72009A Fracture of unspecified part of neck of unspecified femur, initial encounter for closed fracture: Secondary | ICD-10-CM | POA: Diagnosis not present

## 2019-01-14 DIAGNOSIS — N390 Urinary tract infection, site not specified: Secondary | ICD-10-CM | POA: Diagnosis not present

## 2019-01-14 NOTE — Patient Outreach (Signed)
Member assessed for potential [facility] Care Management needs as a benefit of  [place] Medicare.  Member is currently receiving rehab therapy at [facility] SNF.  Member discussed in weekly telephonic IDT meeting with facility staff, [facility] UM team, and writer.  Facility reports member is from ALF prior.   Will continue to follow for disposition plans, progression, and for potential [facility] Care Management needs.   Marthenia Rolling, MSN-Ed, RN,BSN [place] Acute Care Coordinator [phone] [facility]) [phone]  (Toll free office)

## 2019-01-15 DIAGNOSIS — E7849 Other hyperlipidemia: Secondary | ICD-10-CM | POA: Diagnosis not present

## 2019-01-15 DIAGNOSIS — F0391 Unspecified dementia with behavioral disturbance: Secondary | ICD-10-CM | POA: Diagnosis not present

## 2019-01-15 DIAGNOSIS — J449 Chronic obstructive pulmonary disease, unspecified: Secondary | ICD-10-CM | POA: Diagnosis not present

## 2019-01-15 DIAGNOSIS — M9701XD Periprosthetic fracture around internal prosthetic right hip joint, subsequent encounter: Secondary | ICD-10-CM | POA: Diagnosis not present

## 2019-01-17 DIAGNOSIS — F419 Anxiety disorder, unspecified: Secondary | ICD-10-CM | POA: Diagnosis not present

## 2019-01-17 DIAGNOSIS — F909 Attention-deficit hyperactivity disorder, unspecified type: Secondary | ICD-10-CM | POA: Diagnosis not present

## 2019-01-17 DIAGNOSIS — G9349 Other encephalopathy: Secondary | ICD-10-CM | POA: Diagnosis not present

## 2019-01-17 DIAGNOSIS — L89626 Pressure-induced deep tissue damage of left heel: Secondary | ICD-10-CM | POA: Diagnosis not present

## 2019-01-17 DIAGNOSIS — M9701XD Periprosthetic fracture around internal prosthetic right hip joint, subsequent encounter: Secondary | ICD-10-CM | POA: Diagnosis not present

## 2019-01-17 DIAGNOSIS — F329 Major depressive disorder, single episode, unspecified: Secondary | ICD-10-CM | POA: Diagnosis not present

## 2019-01-17 DIAGNOSIS — L89616 Pressure-induced deep tissue damage of right heel: Secondary | ICD-10-CM | POA: Diagnosis not present

## 2019-01-19 ENCOUNTER — Encounter: Payer: Self-pay | Admitting: Orthopedic Surgery

## 2019-01-21 DIAGNOSIS — W19XXXD Unspecified fall, subsequent encounter: Secondary | ICD-10-CM | POA: Diagnosis not present

## 2019-01-21 DIAGNOSIS — F0391 Unspecified dementia with behavioral disturbance: Secondary | ICD-10-CM | POA: Diagnosis not present

## 2019-01-21 DIAGNOSIS — M9701XD Periprosthetic fracture around internal prosthetic right hip joint, subsequent encounter: Secondary | ICD-10-CM | POA: Diagnosis not present

## 2019-01-21 DIAGNOSIS — J449 Chronic obstructive pulmonary disease, unspecified: Secondary | ICD-10-CM | POA: Diagnosis not present

## 2019-01-22 ENCOUNTER — Encounter: Payer: Self-pay | Admitting: Orthopedic Surgery

## 2019-01-22 ENCOUNTER — Other Ambulatory Visit: Payer: Self-pay

## 2019-01-22 ENCOUNTER — Ambulatory Visit (INDEPENDENT_AMBULATORY_CARE_PROVIDER_SITE_OTHER): Payer: Medicare Other

## 2019-01-22 ENCOUNTER — Ambulatory Visit (INDEPENDENT_AMBULATORY_CARE_PROVIDER_SITE_OTHER): Payer: Medicare Other | Admitting: Orthopedic Surgery

## 2019-01-22 VITALS — Ht 63.0 in | Wt 129.0 lb

## 2019-01-22 DIAGNOSIS — M25551 Pain in right hip: Secondary | ICD-10-CM

## 2019-01-22 NOTE — Progress Notes (Signed)
Office Visit Note   Patient: Desiree Salinas           Date of Birth: 07-Jul-1950           MRN: [id number] Visit Date: 01/22/2019              Requested by: [name], MD [address] [place],  [place] [address] PCP: [name], MD  Chief Complaint  Patient presents with  . Right Hip - Routine Post Op    12/26/18 removal nail right femur IM intertroch nail       HPI: 68 y/o follow up 1 month s/p Im nailing femur fracture. She has a history of alzheimers and is in a skilled nursing facility  Assessment & Plan: Visit Diagnoses:  1. Pain in right hip    Dr. Sharol Given did speak with her sister by phone and shared his concerns that the fracture had displaced since surgery because of her poor bone quality Plan: She needs to remain total care nonweightbearing with no gait training.   Follow-Up Instructions: No follow-ups on file.   Ortho Exam  Patient is alert, oriented, no adenopathy, well-dressed, normal affect, normal respiratory effort. Incision is healed. No swelling or erythema compartments soft nontender. Distal CMS is intact.   Imaging: No results found. No images are attached to the encounter.  Labs: Lab Results  Component Value Date   REPTSTATUS 01/05/2019 FINAL 01/04/2019   CULT  01/04/2019    NO GROWTH Performed at [facility], [place] [address], [place], [place] [address]    LABORGA NO GROWTH 03/14/2015     Lab Results  Component Value Date   ALBUMIN 2.7 (L) 12/31/2018   ALBUMIN 2.5 (L) 12/29/2018   ALBUMIN 2.3 (L) 11/30/2018    Lab Results  Component Value Date   MG 2.2 11/05/2017   Lab Results  Component Value Date   VD25OH 40.01 11/05/2017   VD25OH 40.01 08/24/2013   VD25OH 38 08/22/2012    No results found for: PREALBUMIN CBC EXTENDED Latest Ref Rng & Units 01/07/2019 01/06/2019 01/05/2019  WBC 4.0 - 10.5 K/uL 10.8(H) 10.6(H) 14.5(H)  RBC 3.87 - 5.11 MIL/uL 3.05(L) 3.15(L) 3.07(L)  HGB 12.0 - 15.0 g/dL 9.0(L) 9.3(L) 9.1(L)   HCT 36.0 - 46.0 % 28.2(L) 29.2(L) 28.7(L)  PLT 150 - 400 K/uL 524(H) 421(H) 543(H)  NEUTROABS 1.7 - 7.7 K/uL - - -  LYMPHSABS 0.7 - 4.0 K/uL - - -     Body mass index is 22.85 kg/m.  Orders:  Orders Placed This Encounter  Procedures  . XR HIP UNILAT W OR W/O PELVIS 2-3 VIEWS RIGHT   No orders of the defined types were placed in this encounter.    Procedures: No procedures performed  Clinical Data: No additional findings.  ROS:  All other systems negative, except as noted in the HPI. Review of Systems  Objective: Vital Signs: Ht 5\' 3"  (1.6 m)   Wt 129 lb (58.5 kg)   BMI 22.85 kg/m   Specialty Comments:  No specialty comments available.  PMFS History: Patient Active Problem List   Diagnosis Date Noted  . Periprosthetic fracture of hip 12/24/2018  . Hip fracture ([place]) 11/26/2018  . Fall at home, initial encounter 11/26/2018  . Leukocytosis 11/26/2018  . COPD (chronic obstructive pulmonary disease) ([place]) 11/26/2018  . Intertrochanteric fracture of right hip ([place]) 11/26/2018  . Closed intertrochanteric fracture of hip, right, initial encounter ([place])   . Dementia with aggressive behavior ([place]) 11/08/2018  .  Iron deficiency anemia 07/15/2018  . Fall 06/09/2018  . Alcoholic dementia ([place]) Q000111Q  . Degenerative scoliosis 02/10/2018  . Diarrhea 10/14/2013  . Cigarette smoker 08/08/2011  . VAIN I (vaginal intraepithelial neoplasia grade I)   . COLONIC POLYPS 12/21/2007  . ALLERGIC RHINITIS 12/21/2007  . 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 HYPERCHOLESTEROLEMIA 07/05/2007  . 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 DERMATITIS 07/05/2007  . SHINGLES, HX OF 07/05/2007   Past Medical History:  Diagnosis Date  . ADD (attention deficit disorder)   . Allergy   . Anemia   . Anxiety   . Arthritis   . Bruised rib 2020  . Cataract   . Colon polyps    HYPERPLASTIC POLYP  . COPD (chronic obstructive pulmonary disease) ([place])   . Dementia ([place])   .  Depression   . Elevated cholesterol   . Shingles   . STD (sexually transmitted disease)    Condylomata  . Substance abuse ([place])    Stopped drinking one year ago.   Marland Kitchen 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    Family History  Problem Relation Age of Onset  . Breast cancer Mother        Age 47's  . Diabetes Maternal Grandmother   . Aneurysm Maternal Grandmother   . Aneurysm Maternal Aunt   . Aneurysm Maternal Grandfather   . Ankylosing spondylitis Father   . Colon cancer Neg Hx   . Colon polyps Neg Hx   . Esophageal cancer Neg Hx   . Rectal cancer Neg Hx   . Stomach cancer Neg Hx   . Dementia Neg Hx     Past Surgical History:  Procedure Laterality Date  . ABDOMINAL HYSTERECTOMY  1999   TAH  . ABDOMINAL SURGERY  1999   Abdominoplasty  . ACHILLES TENDON SURGERY    . AUGMENTATION MAMMAPLASTY     Implants  . COLONOSCOPY  2006  . HARDWARE REMOVAL Right 12/26/2018   Procedure: REMOVE NAIL RIGHT FEMUR;  Surgeon: Newt Minion, MD;  Location: [facility];  Service: Orthopedics;  Laterality: Right;  . INTRAMEDULLARY (IM) NAIL INTERTROCHANTERIC Right 11/26/2018   Procedure(s) Performed: INTRAMEDULLARY (IM) NAIL INTERTROCHANTRIC RIGHT HIP (Right Hip)  . INTRAMEDULLARY (IM) NAIL INTERTROCHANTERIC Right 11/26/2018   Procedure: INTRAMEDULLARY (IM) NAIL INTERTROCHANTRIC RIGHT HIP;  Surgeon: Newt Minion, MD;  Location: [facility];  Service: Orthopedics;  Laterality: Right;  . INTRAMEDULLARY (IM) NAIL INTERTROCHANTERIC Right 12/26/2018   Procedure: INTRAMEDULLARY LONG INTERTROCHANTRIC NAIL;  Surgeon: Newt Minion, MD;  Location: [facility];  Service: Orthopedics;  Laterality: Right;  . POLYPECTOMY    . TONSILLECTOMY AND ADENOIDECTOMY    . TUBAL LIGATION     Social History   Occupational History  . Occupation: retired  Tobacco Use  . Smoking status: Former Smoker    Packs/day: 0.90    Types: Cigarettes  . Smokeless tobacco: Never Used  . Tobacco  comment: 10 cigarettes per day as of 04/17/2018. Not smoked since the fall as of 06/11/2018  Substance and Sexual Activity  . Alcohol use: Not Currently    Alcohol/week: 0.0 standard drinks    Comment: none since May 2019  . Drug use: Not Currently    Types: Marijuana  . Sexual activity: Never    Birth control/protection: Post-menopausal, Surgical    Comment: 1st intercourse 68 yo-More than 5 partners

## 2019-01-24 DIAGNOSIS — F329 Major depressive disorder, single episode, unspecified: Secondary | ICD-10-CM | POA: Diagnosis not present

## 2019-01-24 DIAGNOSIS — W19XXXD Unspecified fall, subsequent encounter: Secondary | ICD-10-CM | POA: Diagnosis not present

## 2019-01-24 DIAGNOSIS — F909 Attention-deficit hyperactivity disorder, unspecified type: Secondary | ICD-10-CM | POA: Diagnosis not present

## 2019-01-24 DIAGNOSIS — L89616 Pressure-induced deep tissue damage of right heel: Secondary | ICD-10-CM | POA: Diagnosis not present

## 2019-01-24 DIAGNOSIS — L89626 Pressure-induced deep tissue damage of left heel: Secondary | ICD-10-CM | POA: Diagnosis not present

## 2019-01-24 DIAGNOSIS — M9701XD Periprosthetic fracture around internal prosthetic right hip joint, subsequent encounter: Secondary | ICD-10-CM | POA: Diagnosis not present

## 2019-01-24 DIAGNOSIS — F0391 Unspecified dementia with behavioral disturbance: Secondary | ICD-10-CM | POA: Diagnosis not present

## 2019-01-24 DIAGNOSIS — G9349 Other encephalopathy: Secondary | ICD-10-CM | POA: Diagnosis not present

## 2019-01-26 ENCOUNTER — Other Ambulatory Visit: Payer: Self-pay | Admitting: *Deleted

## 2019-01-26 NOTE — Patient Outreach (Signed)
Member assessed for potential [facility] Care Management needs as a benefit of  [place] Medicare.  Member is currently receiving skilled therapy at [facility].   Update received from [facility] indicating that disposition plans remains for member to return to ALF at [facility] discharge.   Marthenia Rolling, MSN-Ed, RN,BSN [place] Acute Care Coordinator [phone] [facility]) [phone]  (Toll free office)

## 2019-01-27 DIAGNOSIS — M9701XD Periprosthetic fracture around internal prosthetic right hip joint, subsequent encounter: Secondary | ICD-10-CM | POA: Diagnosis not present

## 2019-01-27 DIAGNOSIS — J449 Chronic obstructive pulmonary disease, unspecified: Secondary | ICD-10-CM | POA: Diagnosis not present

## 2019-01-27 DIAGNOSIS — F0391 Unspecified dementia with behavioral disturbance: Secondary | ICD-10-CM | POA: Diagnosis not present

## 2019-01-27 DIAGNOSIS — F419 Anxiety disorder, unspecified: Secondary | ICD-10-CM | POA: Diagnosis not present

## 2019-01-28 ENCOUNTER — Other Ambulatory Visit: Payer: Self-pay | Admitting: *Deleted

## 2019-01-28 NOTE — Patient Outreach (Signed)
Member assessed for potential [facility] Care Management needs as a benefit of  [place] Medicare.  Member is currently receiving skilled therapy at [facility]F.  Member discussed in weekly telephonic IDT meeting with facility staff, [facility] UM team, and writer.  Facility reports member is not progressing well with therapy. Member remains non weight bearing status. Member is from ALF. Family is trying to see if member can return to ALF with caregiver assistance.  Will continue to follow for disposition plans, progression, and for potential [facility] Care Management needs.    Marthenia Rolling, MSN-Ed, RN,BSN [place] Acute Care Coordinator [phone] [facility]) [phone]  (Toll free office)

## 2019-02-03 DIAGNOSIS — Z20828 Contact with and (suspected) exposure to other viral communicable diseases: Secondary | ICD-10-CM | POA: Diagnosis not present

## 2019-02-04 ENCOUNTER — Ambulatory Visit: Payer: Medicare Other | Admitting: Adult Health

## 2019-02-05 DIAGNOSIS — M9701XD Periprosthetic fracture around internal prosthetic right hip joint, subsequent encounter: Secondary | ICD-10-CM | POA: Diagnosis not present

## 2019-02-05 DIAGNOSIS — K219 Gastro-esophageal reflux disease without esophagitis: Secondary | ICD-10-CM | POA: Diagnosis not present

## 2019-02-05 DIAGNOSIS — J449 Chronic obstructive pulmonary disease, unspecified: Secondary | ICD-10-CM | POA: Diagnosis not present

## 2019-02-05 DIAGNOSIS — F0391 Unspecified dementia with behavioral disturbance: Secondary | ICD-10-CM | POA: Diagnosis not present

## 2019-02-07 DIAGNOSIS — F0391 Unspecified dementia with behavioral disturbance: Secondary | ICD-10-CM | POA: Diagnosis not present

## 2019-02-07 DIAGNOSIS — L89626 Pressure-induced deep tissue damage of left heel: Secondary | ICD-10-CM | POA: Diagnosis not present

## 2019-02-07 DIAGNOSIS — J449 Chronic obstructive pulmonary disease, unspecified: Secondary | ICD-10-CM | POA: Diagnosis not present

## 2019-02-07 DIAGNOSIS — M9701XD Periprosthetic fracture around internal prosthetic right hip joint, subsequent encounter: Secondary | ICD-10-CM | POA: Diagnosis not present

## 2019-02-07 DIAGNOSIS — F329 Major depressive disorder, single episode, unspecified: Secondary | ICD-10-CM | POA: Diagnosis not present

## 2019-02-07 DIAGNOSIS — F419 Anxiety disorder, unspecified: Secondary | ICD-10-CM | POA: Diagnosis not present

## 2019-02-07 DIAGNOSIS — L89616 Pressure-induced deep tissue damage of right heel: Secondary | ICD-10-CM | POA: Diagnosis not present

## 2019-02-12 ENCOUNTER — Encounter: Payer: Self-pay | Admitting: Orthopedic Surgery

## 2019-02-12 ENCOUNTER — Other Ambulatory Visit: Payer: Self-pay

## 2019-02-12 ENCOUNTER — Ambulatory Visit (INDEPENDENT_AMBULATORY_CARE_PROVIDER_SITE_OTHER): Payer: Medicare Other

## 2019-02-12 ENCOUNTER — Ambulatory Visit (INDEPENDENT_AMBULATORY_CARE_PROVIDER_SITE_OTHER): Payer: Medicare Other | Admitting: Orthopedic Surgery

## 2019-02-12 VITALS — Ht 63.0 in | Wt 129.0 lb

## 2019-02-12 DIAGNOSIS — F331 Major depressive disorder, recurrent, moderate: Secondary | ICD-10-CM | POA: Diagnosis not present

## 2019-02-12 DIAGNOSIS — F329 Major depressive disorder, single episode, unspecified: Secondary | ICD-10-CM | POA: Diagnosis not present

## 2019-02-12 DIAGNOSIS — F419 Anxiety disorder, unspecified: Secondary | ICD-10-CM | POA: Diagnosis not present

## 2019-02-12 DIAGNOSIS — M9701XA Periprosthetic fracture around internal prosthetic right hip joint, initial encounter: Secondary | ICD-10-CM | POA: Diagnosis not present

## 2019-02-12 DIAGNOSIS — R41841 Cognitive communication deficit: Secondary | ICD-10-CM | POA: Diagnosis not present

## 2019-02-12 DIAGNOSIS — L89626 Pressure-induced deep tissue damage of left heel: Secondary | ICD-10-CM | POA: Diagnosis not present

## 2019-02-12 DIAGNOSIS — K219 Gastro-esophageal reflux disease without esophagitis: Secondary | ICD-10-CM | POA: Diagnosis not present

## 2019-02-12 DIAGNOSIS — M25551 Pain in right hip: Secondary | ICD-10-CM

## 2019-02-12 DIAGNOSIS — D509 Iron deficiency anemia, unspecified: Secondary | ICD-10-CM | POA: Diagnosis not present

## 2019-02-12 DIAGNOSIS — M978XXD Periprosthetic fracture around other internal prosthetic joint, subsequent encounter: Secondary | ICD-10-CM | POA: Diagnosis not present

## 2019-02-12 DIAGNOSIS — J449 Chronic obstructive pulmonary disease, unspecified: Secondary | ICD-10-CM | POA: Diagnosis not present

## 2019-02-12 DIAGNOSIS — E785 Hyperlipidemia, unspecified: Secondary | ICD-10-CM | POA: Diagnosis not present

## 2019-02-12 DIAGNOSIS — L89616 Pressure-induced deep tissue damage of right heel: Secondary | ICD-10-CM | POA: Diagnosis not present

## 2019-02-12 DIAGNOSIS — R2681 Unsteadiness on feet: Secondary | ICD-10-CM | POA: Diagnosis not present

## 2019-02-12 DIAGNOSIS — R278 Other lack of coordination: Secondary | ICD-10-CM | POA: Diagnosis not present

## 2019-02-12 DIAGNOSIS — F0391 Unspecified dementia with behavioral disturbance: Secondary | ICD-10-CM | POA: Diagnosis not present

## 2019-02-12 DIAGNOSIS — M9701XD Periprosthetic fracture around internal prosthetic right hip joint, subsequent encounter: Secondary | ICD-10-CM | POA: Diagnosis not present

## 2019-02-12 DIAGNOSIS — M6281 Muscle weakness (generalized): Secondary | ICD-10-CM | POA: Diagnosis not present

## 2019-02-15 DIAGNOSIS — F419 Anxiety disorder, unspecified: Secondary | ICD-10-CM | POA: Diagnosis not present

## 2019-02-15 DIAGNOSIS — K219 Gastro-esophageal reflux disease without esophagitis: Secondary | ICD-10-CM | POA: Diagnosis not present

## 2019-02-15 DIAGNOSIS — M9701XD Periprosthetic fracture around internal prosthetic right hip joint, subsequent encounter: Secondary | ICD-10-CM | POA: Diagnosis not present

## 2019-02-15 DIAGNOSIS — L89626 Pressure-induced deep tissue damage of left heel: Secondary | ICD-10-CM | POA: Diagnosis not present

## 2019-02-15 DIAGNOSIS — F0391 Unspecified dementia with behavioral disturbance: Secondary | ICD-10-CM | POA: Diagnosis not present

## 2019-02-15 DIAGNOSIS — L89616 Pressure-induced deep tissue damage of right heel: Secondary | ICD-10-CM | POA: Diagnosis not present

## 2019-02-15 DIAGNOSIS — F329 Major depressive disorder, single episode, unspecified: Secondary | ICD-10-CM | POA: Diagnosis not present

## 2019-02-16 ENCOUNTER — Other Ambulatory Visit: Payer: Self-pay | Admitting: *Deleted

## 2019-02-16 NOTE — Patient Outreach (Signed)
Member screened for potential [facility] Care Management needs as a benefit of [place] Medicare.  Verified with [facility] dc planner that Ms. Shinabery remains at [facility] under custodial care until she transitions back to ALF.   No identifiable [facility] Care Management needs at this time.   Marthenia Rolling, MSN-Ed, RN,BSN [place] Acute Care Coordinator [phone] [facility]) [phone]  (Toll free office)

## 2019-02-23 ENCOUNTER — Encounter: Payer: Self-pay | Admitting: Orthopedic Surgery

## 2019-02-23 NOTE — Progress Notes (Signed)
Office Visit Note   Patient: Desiree Salinas           Date of Birth: [birth year]           MRN: [id number] Visit Date: 02/12/2019              Requested by: [name], MD [address] [place],  [place] [address] PCP: [name], MD  Chief Complaint  Patient presents with  . Right Hip - Routine Post Op    12/26/18 removal nail right femur IM intertroch nail       HPI: Patient is a 68 year old woman with advanced osteoporosis and dementia status post revision intramedullary nailing of the right hip.  Patient is full assistance with care and is nonweightbearing on the right lower extremity patient states she does have right hip pain.  Assessment & Plan: Visit Diagnoses:  1. Pain in right hip     Plan: Continue nonweightbearing there is evidence of interval healing patient made do strength training seated but no ambulation no gait training.  Plan to repeat radiographs at follow-up.  Follow-Up Instructions: Return in about 4 weeks (around 03/12/2019).   Ortho Exam  Patient is alert, oriented, no adenopathy, well-dressed, normal affect, normal respiratory effort. Examination patient is comfortable in a seated position there is no skin breakdown no redness no cellulitis no signs of infection.  Imaging: No results found. No images are attached to the encounter.  Labs: Lab Results  Component Value Date   REPTSTATUS 01/05/2019 FINAL 01/04/2019   CULT  01/04/2019    NO GROWTH Performed at [facility], [place] [address] [place], [place] [address]    LABORGA NO GROWTH 03/14/2015     Lab Results  Component Value Date   ALBUMIN 2.7 (L) 12/31/2018   ALBUMIN 2.5 (L) 12/29/2018   ALBUMIN 2.3 (L) 11/30/2018    Lab Results  Component Value Date   MG 2.2 11/05/2017   Lab Results  Component Value Date   VD25OH 40.01 11/05/2017   VD25OH 40.01 08/24/2013   VD25OH 38 08/22/2012    No results found for: PREALBUMIN CBC EXTENDED Latest Ref Rng & Units  01/07/2019 01/06/2019 01/05/2019  WBC 4.0 - 10.5 K/uL 10.8(H) 10.6(H) 14.5(H)  RBC 3.87 - 5.11 MIL/uL 3.05(L) 3.15(L) 3.07(L)  HGB 12.0 - 15.0 g/dL 9.0(L) 9.3(L) 9.1(L)  HCT 36.0 - 46.0 % 28.2(L) 29.2(L) 28.7(L)  PLT 150 - 400 K/uL 524(H) 421(H) 543(H)  NEUTROABS 1.7 - 7.7 K/uL - - -  LYMPHSABS 0.7 - 4.0 K/uL - - -     Body mass index is 22.85 kg/m.  Orders:  Orders Placed This Encounter  Procedures  . XR HIP UNILAT W OR W/O PELVIS 2-3 VIEWS RIGHT   No orders of the defined types were placed in this encounter.    Procedures: No procedures performed  Clinical Data: No additional findings.  ROS:  All other systems negative, except as noted in the HPI. Review of Systems  Objective: Vital Signs: Ht 5\' 3"  (1.6 m)   Wt 129 lb (58.5 kg)   BMI 22.85 kg/m   Specialty Comments:  No specialty comments available.  PMFS History: Patient Active Problem List   Diagnosis Date Noted  . Periprosthetic fracture of hip 12/24/2018  . Hip fracture ([place]) 11/26/2018  . Fall at home, initial encounter 11/26/2018  . Leukocytosis 11/26/2018  . COPD (chronic obstructive pulmonary disease) ([place]) 11/26/2018  . Intertrochanteric fracture of right hip ([place]) 11/26/2018  . Closed intertrochanteric  fracture of hip, right, initial encounter ([place])   . Dementia with aggressive behavior ([place]) 11/08/2018  . Iron deficiency anemia 07/15/2018  . Fall 06/09/2018  . Alcoholic dementia ([place]) [id number]  . Degenerative scoliosis 02/10/2018  . Diarrhea 10/14/2013  . Cigarette smoker 08/08/2011  . VAIN I (vaginal intraepithelial neoplasia grade I)   . COLONIC POLYPS 12/21/2007  . ALLERGIC RHINITIS 12/21/2007  . 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 HYPERCHOLESTEROLEMIA 07/05/2007  . 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 DERMATITIS 07/05/2007  . SHINGLES, HX OF 07/05/2007   Past Medical History:  Diagnosis Date  . ADD (attention deficit disorder)   . Allergy   . Anemia   . Anxiety   .  Arthritis   . Bruised rib 2020  . Cataract   . Colon polyps    HYPERPLASTIC POLYP  . COPD (chronic obstructive pulmonary disease) ([place])   . Dementia ([place])   . Depression   . Elevated cholesterol   . Shingles   . STD (sexually transmitted disease)    Condylomata  . Substance abuse ([place])    Stopped drinking one year ago.   Marland Kitchen 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    Family History  Problem Relation Age of Onset  . Breast cancer Mother        Age 36's  . Diabetes Maternal Grandmother   . Aneurysm Maternal Grandmother   . Aneurysm Maternal Aunt   . Aneurysm Maternal Grandfather   . Ankylosing spondylitis Father   . Colon cancer Neg Hx   . Colon polyps Neg Hx   . Esophageal cancer Neg Hx   . Rectal cancer Neg Hx   . Stomach cancer Neg Hx   . Dementia Neg Hx     Past Surgical History:  Procedure Laterality Date  . ABDOMINAL HYSTERECTOMY  1999   TAH  . ABDOMINAL SURGERY  1999   Abdominoplasty  . ACHILLES TENDON SURGERY    . AUGMENTATION MAMMAPLASTY     Implants  . COLONOSCOPY  2006  . HARDWARE REMOVAL Right 12/26/2018   Procedure: REMOVE NAIL RIGHT FEMUR;  Surgeon: Newt Minion, MD;  Location: [facility];  Service: Orthopedics;  Laterality: Right;  . INTRAMEDULLARY (IM) NAIL INTERTROCHANTERIC Right 11/26/2018   Procedure(s) Performed: INTRAMEDULLARY (IM) NAIL INTERTROCHANTRIC RIGHT HIP (Right Hip)  . INTRAMEDULLARY (IM) NAIL INTERTROCHANTERIC Right 11/26/2018   Procedure: INTRAMEDULLARY (IM) NAIL INTERTROCHANTRIC RIGHT HIP;  Surgeon: Newt Minion, MD;  Location: [facility];  Service: Orthopedics;  Laterality: Right;  . INTRAMEDULLARY (IM) NAIL INTERTROCHANTERIC Right 12/26/2018   Procedure: INTRAMEDULLARY LONG INTERTROCHANTRIC NAIL;  Surgeon: Newt Minion, MD;  Location: [facility];  Service: Orthopedics;  Laterality: Right;  . POLYPECTOMY    . TONSILLECTOMY AND ADENOIDECTOMY    . TUBAL LIGATION     Social History   Occupational  History  . Occupation: retired  Tobacco Use  . Smoking status: Former Smoker    Packs/day: 0.90    Types: Cigarettes  . Smokeless tobacco: Never Used  . Tobacco comment: 10 cigarettes per day as of 04/17/2018. Not smoked since the fall as of 06/11/2018  Substance and Sexual Activity  . Alcohol use: Not Currently    Alcohol/week: 0.0 standard drinks    Comment: none since May 2019  . Drug use: Not Currently    Types: Marijuana  . Sexual activity: Never    Birth control/protection: Post-menopausal, Surgical    Comment: 1st intercourse 68 yo-More than  5 partners

## 2019-02-25 DIAGNOSIS — F0391 Unspecified dementia with behavioral disturbance: Secondary | ICD-10-CM | POA: Diagnosis not present

## 2019-02-25 DIAGNOSIS — F329 Major depressive disorder, single episode, unspecified: Secondary | ICD-10-CM | POA: Diagnosis not present

## 2019-02-25 DIAGNOSIS — M6281 Muscle weakness (generalized): Secondary | ICD-10-CM | POA: Diagnosis not present

## 2019-02-25 DIAGNOSIS — Z87440 Personal history of urinary (tract) infections: Secondary | ICD-10-CM | POA: Diagnosis not present

## 2019-02-25 DIAGNOSIS — S72391D Other fracture of shaft of right femur, subsequent encounter for closed fracture with routine healing: Secondary | ICD-10-CM | POA: Diagnosis not present

## 2019-02-25 DIAGNOSIS — Z7982 Long term (current) use of aspirin: Secondary | ICD-10-CM | POA: Diagnosis not present

## 2019-02-25 DIAGNOSIS — L89612 Pressure ulcer of right heel, stage 2: Secondary | ICD-10-CM | POA: Diagnosis not present

## 2019-02-25 DIAGNOSIS — Z8781 Personal history of (healed) traumatic fracture: Secondary | ICD-10-CM | POA: Diagnosis not present

## 2019-02-25 DIAGNOSIS — Z79891 Long term (current) use of opiate analgesic: Secondary | ICD-10-CM | POA: Diagnosis not present

## 2019-02-25 DIAGNOSIS — L89626 Pressure-induced deep tissue damage of left heel: Secondary | ICD-10-CM | POA: Diagnosis not present

## 2019-02-25 DIAGNOSIS — F419 Anxiety disorder, unspecified: Secondary | ICD-10-CM | POA: Diagnosis not present

## 2019-02-25 DIAGNOSIS — R32 Unspecified urinary incontinence: Secondary | ICD-10-CM | POA: Diagnosis not present

## 2019-02-25 DIAGNOSIS — J449 Chronic obstructive pulmonary disease, unspecified: Secondary | ICD-10-CM | POA: Diagnosis not present

## 2019-02-26 DIAGNOSIS — F0391 Unspecified dementia with behavioral disturbance: Secondary | ICD-10-CM | POA: Diagnosis not present

## 2019-02-26 DIAGNOSIS — L89612 Pressure ulcer of right heel, stage 2: Secondary | ICD-10-CM | POA: Diagnosis not present

## 2019-02-26 DIAGNOSIS — S72391D Other fracture of shaft of right femur, subsequent encounter for closed fracture with routine healing: Secondary | ICD-10-CM | POA: Diagnosis not present

## 2019-02-26 DIAGNOSIS — J449 Chronic obstructive pulmonary disease, unspecified: Secondary | ICD-10-CM | POA: Diagnosis not present

## 2019-02-26 DIAGNOSIS — F419 Anxiety disorder, unspecified: Secondary | ICD-10-CM | POA: Diagnosis not present

## 2019-02-26 DIAGNOSIS — L89626 Pressure-induced deep tissue damage of left heel: Secondary | ICD-10-CM | POA: Diagnosis not present

## 2019-03-03 DIAGNOSIS — J449 Chronic obstructive pulmonary disease, unspecified: Secondary | ICD-10-CM | POA: Diagnosis not present

## 2019-03-03 DIAGNOSIS — F419 Anxiety disorder, unspecified: Secondary | ICD-10-CM | POA: Diagnosis not present

## 2019-03-03 DIAGNOSIS — L89626 Pressure-induced deep tissue damage of left heel: Secondary | ICD-10-CM | POA: Diagnosis not present

## 2019-03-03 DIAGNOSIS — S72391D Other fracture of shaft of right femur, subsequent encounter for closed fracture with routine healing: Secondary | ICD-10-CM | POA: Diagnosis not present

## 2019-03-03 DIAGNOSIS — L89612 Pressure ulcer of right heel, stage 2: Secondary | ICD-10-CM | POA: Diagnosis not present

## 2019-03-03 DIAGNOSIS — F0391 Unspecified dementia with behavioral disturbance: Secondary | ICD-10-CM | POA: Diagnosis not present

## 2019-03-05 DIAGNOSIS — L89612 Pressure ulcer of right heel, stage 2: Secondary | ICD-10-CM | POA: Diagnosis not present

## 2019-03-05 DIAGNOSIS — F0391 Unspecified dementia with behavioral disturbance: Secondary | ICD-10-CM | POA: Diagnosis not present

## 2019-03-05 DIAGNOSIS — S72391D Other fracture of shaft of right femur, subsequent encounter for closed fracture with routine healing: Secondary | ICD-10-CM | POA: Diagnosis not present

## 2019-03-05 DIAGNOSIS — L89626 Pressure-induced deep tissue damage of left heel: Secondary | ICD-10-CM | POA: Diagnosis not present

## 2019-03-05 DIAGNOSIS — J449 Chronic obstructive pulmonary disease, unspecified: Secondary | ICD-10-CM | POA: Diagnosis not present

## 2019-03-05 DIAGNOSIS — F419 Anxiety disorder, unspecified: Secondary | ICD-10-CM | POA: Diagnosis not present

## 2019-03-09 DIAGNOSIS — L89612 Pressure ulcer of right heel, stage 2: Secondary | ICD-10-CM | POA: Diagnosis not present

## 2019-03-09 DIAGNOSIS — F0391 Unspecified dementia with behavioral disturbance: Secondary | ICD-10-CM | POA: Diagnosis not present

## 2019-03-09 DIAGNOSIS — F0631 Mood disorder due to known physiological condition with depressive features: Secondary | ICD-10-CM | POA: Diagnosis not present

## 2019-03-09 DIAGNOSIS — F419 Anxiety disorder, unspecified: Secondary | ICD-10-CM | POA: Diagnosis not present

## 2019-03-09 DIAGNOSIS — K219 Gastro-esophageal reflux disease without esophagitis: Secondary | ICD-10-CM | POA: Diagnosis not present

## 2019-03-09 DIAGNOSIS — I10 Essential (primary) hypertension: Secondary | ICD-10-CM | POA: Diagnosis not present

## 2019-03-09 DIAGNOSIS — D509 Iron deficiency anemia, unspecified: Secondary | ICD-10-CM | POA: Diagnosis not present

## 2019-03-09 DIAGNOSIS — F900 Attention-deficit hyperactivity disorder, predominantly inattentive type: Secondary | ICD-10-CM | POA: Diagnosis not present

## 2019-03-09 DIAGNOSIS — J449 Chronic obstructive pulmonary disease, unspecified: Secondary | ICD-10-CM | POA: Diagnosis not present

## 2019-03-09 DIAGNOSIS — S72391D Other fracture of shaft of right femur, subsequent encounter for closed fracture with routine healing: Secondary | ICD-10-CM | POA: Diagnosis not present

## 2019-03-09 DIAGNOSIS — E785 Hyperlipidemia, unspecified: Secondary | ICD-10-CM | POA: Diagnosis not present

## 2019-03-09 DIAGNOSIS — L89626 Pressure-induced deep tissue damage of left heel: Secondary | ICD-10-CM | POA: Diagnosis not present

## 2019-03-10 DIAGNOSIS — F419 Anxiety disorder, unspecified: Secondary | ICD-10-CM | POA: Diagnosis not present

## 2019-03-10 DIAGNOSIS — J449 Chronic obstructive pulmonary disease, unspecified: Secondary | ICD-10-CM | POA: Diagnosis not present

## 2019-03-10 DIAGNOSIS — L89612 Pressure ulcer of right heel, stage 2: Secondary | ICD-10-CM | POA: Diagnosis not present

## 2019-03-10 DIAGNOSIS — F0391 Unspecified dementia with behavioral disturbance: Secondary | ICD-10-CM | POA: Diagnosis not present

## 2019-03-10 DIAGNOSIS — L89626 Pressure-induced deep tissue damage of left heel: Secondary | ICD-10-CM | POA: Diagnosis not present

## 2019-03-10 DIAGNOSIS — S72391D Other fracture of shaft of right femur, subsequent encounter for closed fracture with routine healing: Secondary | ICD-10-CM | POA: Diagnosis not present

## 2019-03-11 DIAGNOSIS — S72391D Other fracture of shaft of right femur, subsequent encounter for closed fracture with routine healing: Secondary | ICD-10-CM | POA: Diagnosis not present

## 2019-03-11 DIAGNOSIS — F0391 Unspecified dementia with behavioral disturbance: Secondary | ICD-10-CM | POA: Diagnosis not present

## 2019-03-11 DIAGNOSIS — F419 Anxiety disorder, unspecified: Secondary | ICD-10-CM | POA: Diagnosis not present

## 2019-03-11 DIAGNOSIS — J449 Chronic obstructive pulmonary disease, unspecified: Secondary | ICD-10-CM | POA: Diagnosis not present

## 2019-03-11 DIAGNOSIS — L89626 Pressure-induced deep tissue damage of left heel: Secondary | ICD-10-CM | POA: Diagnosis not present

## 2019-03-11 DIAGNOSIS — L89612 Pressure ulcer of right heel, stage 2: Secondary | ICD-10-CM | POA: Diagnosis not present

## 2019-03-12 ENCOUNTER — Encounter: Payer: Self-pay | Admitting: Orthopedic Surgery

## 2019-03-12 ENCOUNTER — Ambulatory Visit (INDEPENDENT_AMBULATORY_CARE_PROVIDER_SITE_OTHER): Payer: Medicare Other | Admitting: Orthopedic Surgery

## 2019-03-12 ENCOUNTER — Ambulatory Visit (INDEPENDENT_AMBULATORY_CARE_PROVIDER_SITE_OTHER): Payer: Medicare Other

## 2019-03-12 ENCOUNTER — Other Ambulatory Visit: Payer: Self-pay

## 2019-03-12 DIAGNOSIS — M25551 Pain in right hip: Secondary | ICD-10-CM

## 2019-03-12 DIAGNOSIS — L97421 Non-pressure chronic ulcer of left heel and midfoot limited to breakdown of skin: Secondary | ICD-10-CM | POA: Diagnosis not present

## 2019-03-12 NOTE — Progress Notes (Signed)
Office Visit Note   Patient: Desiree Salinas           Date of Birth: March 07, 1950           MRN: [id number] Visit Date: 03/12/2019              Requested by: [name], MD [address] [place],  [place] [address] PCP: [name], MD  Chief Complaint  Patient presents with  . Right Leg - Routine Post Op      HPI: Patient is a 69 year old woman who follows up for 2 separate issues.  #1 she is status post revision intramedullary nail fixation for the right hip she initially had loss of reduction and she has been nonweightbearing on the right.  At skilled nursing patient developed a large black decubitus heel ulcer on the left.  Patient is seen for initial evaluation for the left heel decubitus ulcer.  Assessment & Plan: Visit Diagnoses:  1. Pain in right hip   2. Non-pressure chronic ulcer of left heel and midfoot limited to breakdown of skin ([place])     Plan: Patient will be placed in a PRAFO she will wear this 24 hours a day with a dry dressing change to the left heel 3 times a week.  Recommended physical therapy to work on range of motion of her lower extremities she is developing contractures of both lower extremities.  Discussed that with therapy she can begin weightbearing on the right lower extremity with weightbearing as tolerated.  Follow-Up Instructions: Return in about 3 weeks (around 04/02/2019).   Ortho Exam  Patient is alert, oriented, no adenopathy, well-dressed, normal affect, normal respiratory effort. Examination patient has a large full-thickness black eschar on the left heel secondary to immobilization at skilled nursing.  The ulcer is 3 cm in diameter there is no cellulitis no odor no drainage no signs of infection.  Patient does have a palpable posterior tibial pulse.  Her foot is cold to the touch.  Examination the right lower extremity there is no pain with range of motion of the hip she is developing flexion contractures in both lower  extremities.  Imaging: XR HIP UNILAT W OR W/O PELVIS 1V RIGHT  Result Date: 03/12/2019 2 view radiographs of the right hip shows good callus formation with stable alignment from her last radiographs.  No complicating features.  No images are attached to the encounter.  Labs: Lab Results  Component Value Date   REPTSTATUS 01/05/2019 FINAL 01/04/2019   CULT  01/04/2019    NO GROWTH Performed at [facility], [place] [address] [place], [place] [address]    LABORGA NO GROWTH 03/14/2015     Lab Results  Component Value Date   ALBUMIN 2.7 (L) 12/31/2018   ALBUMIN 2.5 (L) 12/29/2018   ALBUMIN 2.3 (L) 11/30/2018    Lab Results  Component Value Date   MG 2.2 11/05/2017   Lab Results  Component Value Date   VD25OH 40.01 11/05/2017   VD25OH 40.01 08/24/2013   VD25OH 38 08/22/2012    No results found for: PREALBUMIN CBC EXTENDED Latest Ref Rng & Units 01/07/2019 01/06/2019 01/05/2019  WBC 4.0 - 10.5 K/uL 10.8(H) 10.6(H) 14.5(H)  RBC 3.87 - 5.11 MIL/uL 3.05(L) 3.15(L) 3.07(L)  HGB 12.0 - 15.0 g/dL 9.0(L) 9.3(L) 9.1(L)  HCT 36.0 - 46.0 % 28.2(L) 29.2(L) 28.7(L)  PLT 150 - 400 K/uL 524(H) 421(H) 543(H)  NEUTROABS 1.7 - 7.7 K/uL - - -  LYMPHSABS 0.7 - 4.0 K/uL - - -  There is no height or weight on file to calculate BMI.  Orders:  Orders Placed This Encounter  Procedures  . XR HIP UNILAT W OR W/O PELVIS 1V RIGHT   No orders of the defined types were placed in this encounter.    Procedures: No procedures performed  Clinical Data: No additional findings.  ROS:  All other systems negative, except as noted in the HPI. Review of Systems  Objective: Vital Signs: There were no vitals taken for this visit.  Specialty Comments:  No specialty comments available.  PMFS History: Patient Active Problem List   Diagnosis Date Noted  . Periprosthetic fracture of hip 12/24/2018  . Hip fracture ([place]) 11/26/2018  . Fall at home, initial encounter 11/26/2018  .  Leukocytosis 11/26/2018  . COPD (chronic obstructive pulmonary disease) ([place]) 11/26/2018  . Intertrochanteric fracture of right hip ([place]) 11/26/2018  . Closed intertrochanteric fracture of hip, right, initial encounter ([place])   . 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 Iron deficiency anemia 07/15/2018  . Fall 06/09/2018  . Alcoholic dementia ([place]) Q000111Q  . Degenerative scoliosis 02/10/2018  . Diarrhea 10/14/2013  . Cigarette smoker 08/08/2011  . VAIN I (vaginal intraepithelial neoplasia grade I)   . COLONIC POLYPS 12/21/2007  . ALLERGIC RHINITIS 12/21/2007  . 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 HYPERCHOLESTEROLEMIA 07/05/2007  . 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 DERMATITIS 07/05/2007  . SHINGLES, HX OF 07/05/2007   Past Medical History:  Diagnosis Date  . ADD (attention deficit disorder)   . Allergy   . Anemia   . Anxiety   . Arthritis   . Bruised rib 2020  . Cataract   . Colon polyps    HYPERPLASTIC POLYP  . COPD (chronic obstructive pulmonary disease) ([place])   . Dementia ([place])   . Depression   . Elevated cholesterol   . Shingles   . STD (sexually transmitted disease)    Condylomata  . Substance abuse ([place])    Stopped drinking one year ago.   Marland Kitchen 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    Family History  Problem Relation Age of Onset  . Breast cancer Mother        Age 55's  . Diabetes Maternal Grandmother   . Aneurysm Maternal Grandmother   . Aneurysm Maternal Aunt   . Aneurysm Maternal Grandfather   . Ankylosing spondylitis Father   . Colon cancer Neg Hx   . Colon polyps Neg Hx   . Esophageal cancer Neg Hx   . Rectal cancer Neg Hx   . Stomach cancer Neg Hx   . Dementia Neg Hx     Past Surgical History:  Procedure Laterality Date  . ABDOMINAL HYSTERECTOMY  1999   TAH  . ABDOMINAL SURGERY  1999   Abdominoplasty  . ACHILLES TENDON SURGERY    . AUGMENTATION MAMMAPLASTY     Implants   . COLONOSCOPY  2006  . HARDWARE REMOVAL Right 12/26/2018   Procedure: REMOVE NAIL RIGHT FEMUR;  Surgeon: Newt Minion, MD;  Location: [facility];  Service: Orthopedics;  Laterality: Right;  . INTRAMEDULLARY (IM) NAIL INTERTROCHANTERIC Right 11/26/2018   Procedure(s) Performed: INTRAMEDULLARY (IM) NAIL INTERTROCHANTRIC RIGHT HIP (Right Hip)  . INTRAMEDULLARY (IM) NAIL INTERTROCHANTERIC Right 11/26/2018   Procedure: INTRAMEDULLARY (IM) NAIL INTERTROCHANTRIC RIGHT HIP;  Surgeon: Newt Minion, MD;  Location: [facility];  Service: Orthopedics;  Laterality: Right;  . INTRAMEDULLARY (IM) NAIL INTERTROCHANTERIC Right 12/26/2018   Procedure:  INTRAMEDULLARY LONG INTERTROCHANTRIC NAIL;  Surgeon: Newt Minion, MD;  Location: [facility];  Service: Orthopedics;  Laterality: Right;  . POLYPECTOMY    . TONSILLECTOMY AND ADENOIDECTOMY    . TUBAL LIGATION     Social History   Occupational History  . Occupation: retired  Tobacco Use  . Smoking status: Former Smoker    Packs/day: 0.90    Types: Cigarettes  . Smokeless tobacco: Never Used  . Tobacco comment: 10 cigarettes per day as of 04/17/2018. Not smoked since the fall as of 06/11/2018  Substance and Sexual Activity  . Alcohol use: Not Currently    Alcohol/week: 0.0 standard drinks    Comment: none since May 2019  . Drug use: Not Currently    Types: Marijuana  . Sexual activity: Never    Birth control/protection: Post-menopausal, Surgical    Comment: 1st intercourse 69 yo-More than 5 partners

## 2019-03-13 DIAGNOSIS — L89626 Pressure-induced deep tissue damage of left heel: Secondary | ICD-10-CM | POA: Diagnosis not present

## 2019-03-13 DIAGNOSIS — F419 Anxiety disorder, unspecified: Secondary | ICD-10-CM | POA: Diagnosis not present

## 2019-03-13 DIAGNOSIS — F0391 Unspecified dementia with behavioral disturbance: Secondary | ICD-10-CM | POA: Diagnosis not present

## 2019-03-13 DIAGNOSIS — L89612 Pressure ulcer of right heel, stage 2: Secondary | ICD-10-CM | POA: Diagnosis not present

## 2019-03-13 DIAGNOSIS — S72391D Other fracture of shaft of right femur, subsequent encounter for closed fracture with routine healing: Secondary | ICD-10-CM | POA: Diagnosis not present

## 2019-03-13 DIAGNOSIS — J449 Chronic obstructive pulmonary disease, unspecified: Secondary | ICD-10-CM | POA: Diagnosis not present

## 2019-03-16 DIAGNOSIS — F419 Anxiety disorder, unspecified: Secondary | ICD-10-CM | POA: Diagnosis not present

## 2019-03-16 DIAGNOSIS — J449 Chronic obstructive pulmonary disease, unspecified: Secondary | ICD-10-CM | POA: Diagnosis not present

## 2019-03-16 DIAGNOSIS — F0391 Unspecified dementia with behavioral disturbance: Secondary | ICD-10-CM | POA: Diagnosis not present

## 2019-03-16 DIAGNOSIS — L89626 Pressure-induced deep tissue damage of left heel: Secondary | ICD-10-CM | POA: Diagnosis not present

## 2019-03-16 DIAGNOSIS — S72391D Other fracture of shaft of right femur, subsequent encounter for closed fracture with routine healing: Secondary | ICD-10-CM | POA: Diagnosis not present

## 2019-03-16 DIAGNOSIS — L89612 Pressure ulcer of right heel, stage 2: Secondary | ICD-10-CM | POA: Diagnosis not present

## 2019-03-17 DIAGNOSIS — L89612 Pressure ulcer of right heel, stage 2: Secondary | ICD-10-CM | POA: Diagnosis not present

## 2019-03-17 DIAGNOSIS — F419 Anxiety disorder, unspecified: Secondary | ICD-10-CM | POA: Diagnosis not present

## 2019-03-17 DIAGNOSIS — J449 Chronic obstructive pulmonary disease, unspecified: Secondary | ICD-10-CM | POA: Diagnosis not present

## 2019-03-17 DIAGNOSIS — L89626 Pressure-induced deep tissue damage of left heel: Secondary | ICD-10-CM | POA: Diagnosis not present

## 2019-03-17 DIAGNOSIS — S72391D Other fracture of shaft of right femur, subsequent encounter for closed fracture with routine healing: Secondary | ICD-10-CM | POA: Diagnosis not present

## 2019-03-17 DIAGNOSIS — F0391 Unspecified dementia with behavioral disturbance: Secondary | ICD-10-CM | POA: Diagnosis not present

## 2019-03-19 DIAGNOSIS — L89626 Pressure-induced deep tissue damage of left heel: Secondary | ICD-10-CM | POA: Diagnosis not present

## 2019-03-19 DIAGNOSIS — J449 Chronic obstructive pulmonary disease, unspecified: Secondary | ICD-10-CM | POA: Diagnosis not present

## 2019-03-19 DIAGNOSIS — S72391D Other fracture of shaft of right femur, subsequent encounter for closed fracture with routine healing: Secondary | ICD-10-CM | POA: Diagnosis not present

## 2019-03-19 DIAGNOSIS — F419 Anxiety disorder, unspecified: Secondary | ICD-10-CM | POA: Diagnosis not present

## 2019-03-19 DIAGNOSIS — L89612 Pressure ulcer of right heel, stage 2: Secondary | ICD-10-CM | POA: Diagnosis not present

## 2019-03-19 DIAGNOSIS — F0391 Unspecified dementia with behavioral disturbance: Secondary | ICD-10-CM | POA: Diagnosis not present

## 2019-03-20 DIAGNOSIS — L89626 Pressure-induced deep tissue damage of left heel: Secondary | ICD-10-CM | POA: Diagnosis not present

## 2019-03-20 DIAGNOSIS — J449 Chronic obstructive pulmonary disease, unspecified: Secondary | ICD-10-CM | POA: Diagnosis not present

## 2019-03-20 DIAGNOSIS — F0391 Unspecified dementia with behavioral disturbance: Secondary | ICD-10-CM | POA: Diagnosis not present

## 2019-03-20 DIAGNOSIS — S72391D Other fracture of shaft of right femur, subsequent encounter for closed fracture with routine healing: Secondary | ICD-10-CM | POA: Diagnosis not present

## 2019-03-20 DIAGNOSIS — L89612 Pressure ulcer of right heel, stage 2: Secondary | ICD-10-CM | POA: Diagnosis not present

## 2019-03-20 DIAGNOSIS — Z23 Encounter for immunization: Secondary | ICD-10-CM | POA: Diagnosis not present

## 2019-03-20 DIAGNOSIS — F419 Anxiety disorder, unspecified: Secondary | ICD-10-CM | POA: Diagnosis not present

## 2019-03-23 DIAGNOSIS — F419 Anxiety disorder, unspecified: Secondary | ICD-10-CM | POA: Diagnosis not present

## 2019-03-23 DIAGNOSIS — F0391 Unspecified dementia with behavioral disturbance: Secondary | ICD-10-CM | POA: Diagnosis not present

## 2019-03-23 DIAGNOSIS — L89626 Pressure-induced deep tissue damage of left heel: Secondary | ICD-10-CM | POA: Diagnosis not present

## 2019-03-23 DIAGNOSIS — L89612 Pressure ulcer of right heel, stage 2: Secondary | ICD-10-CM | POA: Diagnosis not present

## 2019-03-23 DIAGNOSIS — J449 Chronic obstructive pulmonary disease, unspecified: Secondary | ICD-10-CM | POA: Diagnosis not present

## 2019-03-23 DIAGNOSIS — S72391D Other fracture of shaft of right femur, subsequent encounter for closed fracture with routine healing: Secondary | ICD-10-CM | POA: Diagnosis not present

## 2019-03-24 DIAGNOSIS — F419 Anxiety disorder, unspecified: Secondary | ICD-10-CM | POA: Diagnosis not present

## 2019-03-24 DIAGNOSIS — S72391D Other fracture of shaft of right femur, subsequent encounter for closed fracture with routine healing: Secondary | ICD-10-CM | POA: Diagnosis not present

## 2019-03-24 DIAGNOSIS — J449 Chronic obstructive pulmonary disease, unspecified: Secondary | ICD-10-CM | POA: Diagnosis not present

## 2019-03-24 DIAGNOSIS — F0391 Unspecified dementia with behavioral disturbance: Secondary | ICD-10-CM | POA: Diagnosis not present

## 2019-03-24 DIAGNOSIS — L89612 Pressure ulcer of right heel, stage 2: Secondary | ICD-10-CM | POA: Diagnosis not present

## 2019-03-24 DIAGNOSIS — L89626 Pressure-induced deep tissue damage of left heel: Secondary | ICD-10-CM | POA: Diagnosis not present

## 2019-03-26 DIAGNOSIS — J449 Chronic obstructive pulmonary disease, unspecified: Secondary | ICD-10-CM | POA: Diagnosis not present

## 2019-03-26 DIAGNOSIS — L89612 Pressure ulcer of right heel, stage 2: Secondary | ICD-10-CM | POA: Diagnosis not present

## 2019-03-26 DIAGNOSIS — L89626 Pressure-induced deep tissue damage of left heel: Secondary | ICD-10-CM | POA: Diagnosis not present

## 2019-03-26 DIAGNOSIS — S72391D Other fracture of shaft of right femur, subsequent encounter for closed fracture with routine healing: Secondary | ICD-10-CM | POA: Diagnosis not present

## 2019-03-26 DIAGNOSIS — F419 Anxiety disorder, unspecified: Secondary | ICD-10-CM | POA: Diagnosis not present

## 2019-03-26 DIAGNOSIS — F0391 Unspecified dementia with behavioral disturbance: Secondary | ICD-10-CM | POA: Diagnosis not present

## 2019-03-27 DIAGNOSIS — J449 Chronic obstructive pulmonary disease, unspecified: Secondary | ICD-10-CM | POA: Diagnosis not present

## 2019-03-27 DIAGNOSIS — F419 Anxiety disorder, unspecified: Secondary | ICD-10-CM | POA: Diagnosis not present

## 2019-03-27 DIAGNOSIS — F329 Major depressive disorder, single episode, unspecified: Secondary | ICD-10-CM | POA: Diagnosis not present

## 2019-03-27 DIAGNOSIS — M6281 Muscle weakness (generalized): Secondary | ICD-10-CM | POA: Diagnosis not present

## 2019-03-27 DIAGNOSIS — Z79891 Long term (current) use of opiate analgesic: Secondary | ICD-10-CM | POA: Diagnosis not present

## 2019-03-27 DIAGNOSIS — Z8781 Personal history of (healed) traumatic fracture: Secondary | ICD-10-CM | POA: Diagnosis not present

## 2019-03-27 DIAGNOSIS — Z7982 Long term (current) use of aspirin: Secondary | ICD-10-CM | POA: Diagnosis not present

## 2019-03-27 DIAGNOSIS — L89612 Pressure ulcer of right heel, stage 2: Secondary | ICD-10-CM | POA: Diagnosis not present

## 2019-03-27 DIAGNOSIS — F0391 Unspecified dementia with behavioral disturbance: Secondary | ICD-10-CM | POA: Diagnosis not present

## 2019-03-27 DIAGNOSIS — L89626 Pressure-induced deep tissue damage of left heel: Secondary | ICD-10-CM | POA: Diagnosis not present

## 2019-03-27 DIAGNOSIS — S72391D Other fracture of shaft of right femur, subsequent encounter for closed fracture with routine healing: Secondary | ICD-10-CM | POA: Diagnosis not present

## 2019-03-27 DIAGNOSIS — R32 Unspecified urinary incontinence: Secondary | ICD-10-CM | POA: Diagnosis not present

## 2019-03-27 DIAGNOSIS — Z87440 Personal history of urinary (tract) infections: Secondary | ICD-10-CM | POA: Diagnosis not present

## 2019-03-30 ENCOUNTER — Encounter: Payer: Self-pay | Admitting: Orthopedic Surgery

## 2019-03-31 DIAGNOSIS — E785 Hyperlipidemia, unspecified: Secondary | ICD-10-CM | POA: Diagnosis not present

## 2019-03-31 DIAGNOSIS — L89626 Pressure-induced deep tissue damage of left heel: Secondary | ICD-10-CM | POA: Diagnosis not present

## 2019-03-31 DIAGNOSIS — F419 Anxiety disorder, unspecified: Secondary | ICD-10-CM | POA: Diagnosis not present

## 2019-03-31 DIAGNOSIS — M6281 Muscle weakness (generalized): Secondary | ICD-10-CM | POA: Diagnosis not present

## 2019-03-31 DIAGNOSIS — J449 Chronic obstructive pulmonary disease, unspecified: Secondary | ICD-10-CM | POA: Diagnosis not present

## 2019-03-31 DIAGNOSIS — S72391D Other fracture of shaft of right femur, subsequent encounter for closed fracture with routine healing: Secondary | ICD-10-CM | POA: Diagnosis not present

## 2019-03-31 DIAGNOSIS — L89612 Pressure ulcer of right heel, stage 2: Secondary | ICD-10-CM | POA: Diagnosis not present

## 2019-03-31 DIAGNOSIS — K219 Gastro-esophageal reflux disease without esophagitis: Secondary | ICD-10-CM | POA: Diagnosis not present

## 2019-03-31 DIAGNOSIS — F0631 Mood disorder due to known physiological condition with depressive features: Secondary | ICD-10-CM | POA: Diagnosis not present

## 2019-03-31 DIAGNOSIS — G301 Alzheimer's disease with late onset: Secondary | ICD-10-CM | POA: Diagnosis not present

## 2019-03-31 DIAGNOSIS — D509 Iron deficiency anemia, unspecified: Secondary | ICD-10-CM | POA: Diagnosis not present

## 2019-03-31 DIAGNOSIS — I10 Essential (primary) hypertension: Secondary | ICD-10-CM | POA: Diagnosis not present

## 2019-03-31 DIAGNOSIS — F0391 Unspecified dementia with behavioral disturbance: Secondary | ICD-10-CM | POA: Diagnosis not present

## 2019-04-01 DIAGNOSIS — L89612 Pressure ulcer of right heel, stage 2: Secondary | ICD-10-CM | POA: Diagnosis not present

## 2019-04-01 DIAGNOSIS — F0391 Unspecified dementia with behavioral disturbance: Secondary | ICD-10-CM | POA: Diagnosis not present

## 2019-04-01 DIAGNOSIS — L89626 Pressure-induced deep tissue damage of left heel: Secondary | ICD-10-CM | POA: Diagnosis not present

## 2019-04-01 DIAGNOSIS — J449 Chronic obstructive pulmonary disease, unspecified: Secondary | ICD-10-CM | POA: Diagnosis not present

## 2019-04-01 DIAGNOSIS — F419 Anxiety disorder, unspecified: Secondary | ICD-10-CM | POA: Diagnosis not present

## 2019-04-01 DIAGNOSIS — S72391D Other fracture of shaft of right femur, subsequent encounter for closed fracture with routine healing: Secondary | ICD-10-CM | POA: Diagnosis not present

## 2019-04-02 ENCOUNTER — Other Ambulatory Visit: Payer: Self-pay

## 2019-04-02 ENCOUNTER — Encounter: Payer: Self-pay | Admitting: Orthopedic Surgery

## 2019-04-02 ENCOUNTER — Ambulatory Visit (INDEPENDENT_AMBULATORY_CARE_PROVIDER_SITE_OTHER): Payer: Medicare Other | Admitting: Orthopedic Surgery

## 2019-04-02 VITALS — Ht 63.0 in | Wt 129.0 lb

## 2019-04-02 DIAGNOSIS — L89626 Pressure-induced deep tissue damage of left heel: Secondary | ICD-10-CM | POA: Diagnosis not present

## 2019-04-02 DIAGNOSIS — L89612 Pressure ulcer of right heel, stage 2: Secondary | ICD-10-CM | POA: Diagnosis not present

## 2019-04-02 DIAGNOSIS — M24559 Contracture, unspecified hip: Secondary | ICD-10-CM

## 2019-04-02 DIAGNOSIS — M25551 Pain in right hip: Secondary | ICD-10-CM | POA: Diagnosis not present

## 2019-04-02 DIAGNOSIS — T85848S Pain due to other internal prosthetic devices, implants and grafts, sequela: Secondary | ICD-10-CM

## 2019-04-02 DIAGNOSIS — S72391D Other fracture of shaft of right femur, subsequent encounter for closed fracture with routine healing: Secondary | ICD-10-CM | POA: Diagnosis not present

## 2019-04-02 DIAGNOSIS — L89624 Pressure ulcer of left heel, stage 4: Secondary | ICD-10-CM | POA: Diagnosis not present

## 2019-04-02 DIAGNOSIS — F419 Anxiety disorder, unspecified: Secondary | ICD-10-CM | POA: Diagnosis not present

## 2019-04-02 DIAGNOSIS — M24569 Contracture, unspecified knee: Secondary | ICD-10-CM

## 2019-04-02 DIAGNOSIS — F0391 Unspecified dementia with behavioral disturbance: Secondary | ICD-10-CM | POA: Diagnosis not present

## 2019-04-02 DIAGNOSIS — J449 Chronic obstructive pulmonary disease, unspecified: Secondary | ICD-10-CM | POA: Diagnosis not present

## 2019-04-06 DIAGNOSIS — M6281 Muscle weakness (generalized): Secondary | ICD-10-CM | POA: Diagnosis not present

## 2019-04-06 DIAGNOSIS — N390 Urinary tract infection, site not specified: Secondary | ICD-10-CM | POA: Diagnosis not present

## 2019-04-06 DIAGNOSIS — G300 Alzheimer's disease with early onset: Secondary | ICD-10-CM | POA: Diagnosis not present

## 2019-04-07 ENCOUNTER — Encounter: Payer: Self-pay | Admitting: Orthopedic Surgery

## 2019-04-07 DIAGNOSIS — L89626 Pressure-induced deep tissue damage of left heel: Secondary | ICD-10-CM | POA: Diagnosis not present

## 2019-04-07 DIAGNOSIS — J449 Chronic obstructive pulmonary disease, unspecified: Secondary | ICD-10-CM | POA: Diagnosis not present

## 2019-04-07 DIAGNOSIS — F0391 Unspecified dementia with behavioral disturbance: Secondary | ICD-10-CM | POA: Diagnosis not present

## 2019-04-07 DIAGNOSIS — S72391D Other fracture of shaft of right femur, subsequent encounter for closed fracture with routine healing: Secondary | ICD-10-CM | POA: Diagnosis not present

## 2019-04-07 DIAGNOSIS — L89612 Pressure ulcer of right heel, stage 2: Secondary | ICD-10-CM | POA: Diagnosis not present

## 2019-04-07 DIAGNOSIS — F419 Anxiety disorder, unspecified: Secondary | ICD-10-CM | POA: Diagnosis not present

## 2019-04-07 NOTE — Progress Notes (Signed)
Office Visit Note   Patient: Desiree Salinas           Date of Birth: Jul 30, 1950           MRN: [id number] Visit Date: 04/02/2019              Requested by: [name], MD [address] [place],  [place] [address] PCP: [name], MD  Chief Complaint  Patient presents with  . Right Hip - Routine Post Op    12/26/2018 right hip IM      HPI: Patient is a 69 year old woman who is status post intramedullary nailing of the right intertrochanteric hip fracture she developed collapse through the bone with pain with retained hardware and underwent removal of the hardware in October of last year.  Patient has been doing physical therapy without any improvement.  Patient has had a caregiver with her for the past 2 years and she is unable to get the patient to stand at this time.  Assessment & Plan: Visit Diagnoses:  1. Pain in right hip   2. Pain from implanted hardware, sequela     Plan: We will call [facility] and see if they can get their old therapist Elta Guadeloupe to resume therapy at home.  Patient is developing significant contractures of both lower extremities and needs aggressive help with her hip flexion and abduction contracture.  In patient's current states she will be unable to ever walk or stand again or transfer.  Follow-Up Instructions: Return in about 4 weeks (around 04/30/2019).   Ortho Exam  Patient is alert, oriented, no adenopathy, well-dressed, normal affect, normal respiratory effort. Examination patient has developed significant flexion contractures in both lower extremities both legs are abducted together and have a tight contracture of the hip abductors.  Patient also has a significant hip flexion contracture and a knee flexion contracture with her knees contracted at 90 degrees.  Patient has a decubitus left heel ulcer which is black her foot is cold to the touch she is currently wearing a protective PRAFO boot and a heel pad.  There is no depth to the black eschar  there is no drainage no cellulitis.  Imaging: No results found. No images are attached to the encounter.  Labs: Lab Results  Component Value Date   REPTSTATUS 01/05/2019 FINAL 01/04/2019   CULT  01/04/2019    NO GROWTH Performed at [facility], [place] [address] [place],  [address]    LABORGA NO GROWTH 03/14/2015     Lab Results  Component Value Date   ALBUMIN 2.7 (L) 12/31/2018   ALBUMIN 2.5 (L) 12/29/2018   ALBUMIN 2.3 (L) 11/30/2018    Lab Results  Component Value Date   MG 2.2 11/05/2017   Lab Results  Component Value Date   VD25OH 40.01 11/05/2017   VD25OH 40.01 08/24/2013   VD25OH 38 08/22/2012    No results found for: PREALBUMIN CBC EXTENDED Latest Ref Rng & Units 01/07/2019 01/06/2019 01/05/2019  WBC 4.0 - 10.5 K/uL 10.8(H) 10.6(H) 14.5(H)  RBC 3.87 - 5.11 MIL/uL 3.05(L) 3.15(L) 3.07(L)  HGB 12.0 - 15.0 g/dL 9.0(L) 9.3(L) 9.1(L)  HCT 36.0 - 46.0 % 28.2(L) 29.2(L) 28.7(L)  PLT 150 - 400 K/uL 524(H) 421(H) 543(H)  NEUTROABS 1.7 - 7.7 K/uL - - -  LYMPHSABS 0.7 - 4.0 K/uL - - -     Body mass index is 22.85 kg/m.  Orders:  No orders of the defined types were placed in this encounter.  No  orders of the defined types were placed in this encounter.    Procedures: No procedures performed  Clinical Data: No additional findings.  ROS:  All other systems negative, except as noted in the HPI. Review of Systems  Objective: Vital Signs: Ht 5\' 3"  (1.6 m)   Wt 129 lb (58.5 kg)   BMI 22.85 kg/m   Specialty Comments:  No specialty comments available.  PMFS History: Patient Active Problem List   Diagnosis Date Noted  . Periprosthetic fracture of hip 12/24/2018  . Hip fracture ([place]) 11/26/2018  . Fall at home, initial encounter 11/26/2018  . Leukocytosis 11/26/2018  . COPD (chronic obstructive pulmonary disease) ([place]) 11/26/2018  . Intertrochanteric fracture of right hip ([place]) 11/26/2018  . Closed intertrochanteric fracture of hip,  right, initial encounter ([place])   . 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 Iron deficiency anemia 07/15/2018  . Fall 06/09/2018  . 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 Degenerative scoliosis 02/10/2018  . Diarrhea 10/14/2013  . Cigarette smoker 08/08/2011  . VAIN I (vaginal intraepithelial neoplasia grade I)   . COLONIC POLYPS 12/21/2007  . ALLERGIC RHINITIS 12/21/2007  . 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 HYPERCHOLESTEROLEMIA 07/05/2007  . 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 DERMATITIS 07/05/2007  . SHINGLES, HX OF 07/05/2007   Past Medical History:  Diagnosis Date  . ADD (attention deficit disorder)   . Allergy   . Anemia   . Anxiety   . Arthritis   . Bruised rib 2020  . Cataract   . Colon polyps    HYPERPLASTIC POLYP  . COPD (chronic obstructive pulmonary disease) ([place])   . Dementia ([place])   . Depression   . Elevated cholesterol   . Shingles   . STD (sexually transmitted disease)    Condylomata  . Substance abuse ([place])    Stopped drinking one year ago.   Marland Kitchen 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    Family History  Problem Relation Age of Onset  . Breast cancer Mother        Age 45's  . Diabetes Maternal Grandmother   . Aneurysm Maternal Grandmother   . Aneurysm Maternal Aunt   . Aneurysm Maternal Grandfather   . Ankylosing spondylitis Father   . Colon cancer Neg Hx   . Colon polyps Neg Hx   . Esophageal cancer Neg Hx   . Rectal cancer Neg Hx   . Stomach cancer Neg Hx   . Dementia Neg Hx     Past Surgical History:  Procedure Laterality Date  . ABDOMINAL HYSTERECTOMY  1999   TAH  . ABDOMINAL SURGERY  1999   Abdominoplasty  . ACHILLES TENDON SURGERY    . AUGMENTATION MAMMAPLASTY     Implants  . COLONOSCOPY  2006  . HARDWARE REMOVAL Right 12/26/2018   Procedure: REMOVE NAIL RIGHT FEMUR;  Surgeon: Newt Minion, MD;  Location: [facility];  Service: Orthopedics;  Laterality: Right;  .  INTRAMEDULLARY (IM) NAIL INTERTROCHANTERIC Right 11/26/2018   Procedure(s) Performed: INTRAMEDULLARY (IM) NAIL INTERTROCHANTRIC RIGHT HIP (Right Hip)  . INTRAMEDULLARY (IM) NAIL INTERTROCHANTERIC Right 11/26/2018   Procedure: INTRAMEDULLARY (IM) NAIL INTERTROCHANTRIC RIGHT HIP;  Surgeon: Newt Minion, MD;  Location: [facility];  Service: Orthopedics;  Laterality: Right;  . INTRAMEDULLARY (IM) NAIL INTERTROCHANTERIC Right 12/26/2018   Procedure: INTRAMEDULLARY LONG INTERTROCHANTRIC NAIL;  Surgeon: Newt Minion, MD;  Location: [facility];  Service: Orthopedics;  Laterality: Right;  . POLYPECTOMY    .  TONSILLECTOMY AND ADENOIDECTOMY    . TUBAL LIGATION     Social History   Occupational History  . Occupation: retired  Tobacco Use  . Smoking status: Former Smoker    Packs/day: 0.90    Types: Cigarettes  . Smokeless tobacco: Never Used  . Tobacco comment: 10 cigarettes per day as of 04/17/2018. Not smoked since the fall as of 06/11/2018  Substance and Sexual Activity  . Alcohol use: Not Currently    Alcohol/week: 0.0 standard drinks    Comment: none since May 2019  . Drug use: Not Currently    Types: Marijuana  . Sexual activity: Never    Birth control/protection: Post-menopausal, Surgical    Comment: 1st intercourse 69 yo-More than 5 partners

## 2019-04-08 DIAGNOSIS — L89626 Pressure-induced deep tissue damage of left heel: Secondary | ICD-10-CM | POA: Diagnosis not present

## 2019-04-08 DIAGNOSIS — M79674 Pain in right toe(s): Secondary | ICD-10-CM | POA: Diagnosis not present

## 2019-04-08 DIAGNOSIS — J449 Chronic obstructive pulmonary disease, unspecified: Secondary | ICD-10-CM | POA: Diagnosis not present

## 2019-04-08 DIAGNOSIS — F419 Anxiety disorder, unspecified: Secondary | ICD-10-CM | POA: Diagnosis not present

## 2019-04-08 DIAGNOSIS — F0391 Unspecified dementia with behavioral disturbance: Secondary | ICD-10-CM | POA: Diagnosis not present

## 2019-04-08 DIAGNOSIS — B351 Tinea unguium: Secondary | ICD-10-CM | POA: Diagnosis not present

## 2019-04-08 DIAGNOSIS — S72391D Other fracture of shaft of right femur, subsequent encounter for closed fracture with routine healing: Secondary | ICD-10-CM | POA: Diagnosis not present

## 2019-04-08 DIAGNOSIS — M79675 Pain in left toe(s): Secondary | ICD-10-CM | POA: Diagnosis not present

## 2019-04-08 DIAGNOSIS — L89612 Pressure ulcer of right heel, stage 2: Secondary | ICD-10-CM | POA: Diagnosis not present

## 2019-04-09 DIAGNOSIS — L89612 Pressure ulcer of right heel, stage 2: Secondary | ICD-10-CM | POA: Diagnosis not present

## 2019-04-09 DIAGNOSIS — F419 Anxiety disorder, unspecified: Secondary | ICD-10-CM | POA: Diagnosis not present

## 2019-04-09 DIAGNOSIS — L89626 Pressure-induced deep tissue damage of left heel: Secondary | ICD-10-CM | POA: Diagnosis not present

## 2019-04-09 DIAGNOSIS — S72391D Other fracture of shaft of right femur, subsequent encounter for closed fracture with routine healing: Secondary | ICD-10-CM | POA: Diagnosis not present

## 2019-04-09 DIAGNOSIS — J449 Chronic obstructive pulmonary disease, unspecified: Secondary | ICD-10-CM | POA: Diagnosis not present

## 2019-04-09 DIAGNOSIS — F0391 Unspecified dementia with behavioral disturbance: Secondary | ICD-10-CM | POA: Diagnosis not present

## 2019-04-10 DIAGNOSIS — F0391 Unspecified dementia with behavioral disturbance: Secondary | ICD-10-CM | POA: Diagnosis not present

## 2019-04-10 DIAGNOSIS — S72391D Other fracture of shaft of right femur, subsequent encounter for closed fracture with routine healing: Secondary | ICD-10-CM | POA: Diagnosis not present

## 2019-04-10 DIAGNOSIS — F419 Anxiety disorder, unspecified: Secondary | ICD-10-CM | POA: Diagnosis not present

## 2019-04-10 DIAGNOSIS — L89612 Pressure ulcer of right heel, stage 2: Secondary | ICD-10-CM | POA: Diagnosis not present

## 2019-04-10 DIAGNOSIS — L89626 Pressure-induced deep tissue damage of left heel: Secondary | ICD-10-CM | POA: Diagnosis not present

## 2019-04-10 DIAGNOSIS — J449 Chronic obstructive pulmonary disease, unspecified: Secondary | ICD-10-CM | POA: Diagnosis not present

## 2019-04-14 DIAGNOSIS — F419 Anxiety disorder, unspecified: Secondary | ICD-10-CM | POA: Diagnosis not present

## 2019-04-14 DIAGNOSIS — J449 Chronic obstructive pulmonary disease, unspecified: Secondary | ICD-10-CM | POA: Diagnosis not present

## 2019-04-14 DIAGNOSIS — L89612 Pressure ulcer of right heel, stage 2: Secondary | ICD-10-CM | POA: Diagnosis not present

## 2019-04-14 DIAGNOSIS — S72391D Other fracture of shaft of right femur, subsequent encounter for closed fracture with routine healing: Secondary | ICD-10-CM | POA: Diagnosis not present

## 2019-04-14 DIAGNOSIS — F0391 Unspecified dementia with behavioral disturbance: Secondary | ICD-10-CM | POA: Diagnosis not present

## 2019-04-14 DIAGNOSIS — L89626 Pressure-induced deep tissue damage of left heel: Secondary | ICD-10-CM | POA: Diagnosis not present

## 2019-04-16 DIAGNOSIS — L89612 Pressure ulcer of right heel, stage 2: Secondary | ICD-10-CM | POA: Diagnosis not present

## 2019-04-16 DIAGNOSIS — S72391D Other fracture of shaft of right femur, subsequent encounter for closed fracture with routine healing: Secondary | ICD-10-CM | POA: Diagnosis not present

## 2019-04-16 DIAGNOSIS — J449 Chronic obstructive pulmonary disease, unspecified: Secondary | ICD-10-CM | POA: Diagnosis not present

## 2019-04-16 DIAGNOSIS — F419 Anxiety disorder, unspecified: Secondary | ICD-10-CM | POA: Diagnosis not present

## 2019-04-16 DIAGNOSIS — F0391 Unspecified dementia with behavioral disturbance: Secondary | ICD-10-CM | POA: Diagnosis not present

## 2019-04-16 DIAGNOSIS — L89626 Pressure-induced deep tissue damage of left heel: Secondary | ICD-10-CM | POA: Diagnosis not present

## 2019-04-17 DIAGNOSIS — S72391D Other fracture of shaft of right femur, subsequent encounter for closed fracture with routine healing: Secondary | ICD-10-CM | POA: Diagnosis not present

## 2019-04-17 DIAGNOSIS — Z23 Encounter for immunization: Secondary | ICD-10-CM | POA: Diagnosis not present

## 2019-04-17 DIAGNOSIS — F419 Anxiety disorder, unspecified: Secondary | ICD-10-CM | POA: Diagnosis not present

## 2019-04-17 DIAGNOSIS — L89612 Pressure ulcer of right heel, stage 2: Secondary | ICD-10-CM | POA: Diagnosis not present

## 2019-04-17 DIAGNOSIS — L89626 Pressure-induced deep tissue damage of left heel: Secondary | ICD-10-CM | POA: Diagnosis not present

## 2019-04-17 DIAGNOSIS — F0391 Unspecified dementia with behavioral disturbance: Secondary | ICD-10-CM | POA: Diagnosis not present

## 2019-04-17 DIAGNOSIS — J449 Chronic obstructive pulmonary disease, unspecified: Secondary | ICD-10-CM | POA: Diagnosis not present

## 2019-04-21 DIAGNOSIS — L89626 Pressure-induced deep tissue damage of left heel: Secondary | ICD-10-CM | POA: Diagnosis not present

## 2019-04-21 DIAGNOSIS — S72391D Other fracture of shaft of right femur, subsequent encounter for closed fracture with routine healing: Secondary | ICD-10-CM | POA: Diagnosis not present

## 2019-04-21 DIAGNOSIS — F0391 Unspecified dementia with behavioral disturbance: Secondary | ICD-10-CM | POA: Diagnosis not present

## 2019-04-21 DIAGNOSIS — F419 Anxiety disorder, unspecified: Secondary | ICD-10-CM | POA: Diagnosis not present

## 2019-04-21 DIAGNOSIS — J449 Chronic obstructive pulmonary disease, unspecified: Secondary | ICD-10-CM | POA: Diagnosis not present

## 2019-04-21 DIAGNOSIS — L89612 Pressure ulcer of right heel, stage 2: Secondary | ICD-10-CM | POA: Diagnosis not present

## 2019-04-22 DIAGNOSIS — F419 Anxiety disorder, unspecified: Secondary | ICD-10-CM | POA: Diagnosis not present

## 2019-04-22 DIAGNOSIS — L89612 Pressure ulcer of right heel, stage 2: Secondary | ICD-10-CM | POA: Diagnosis not present

## 2019-04-22 DIAGNOSIS — F0391 Unspecified dementia with behavioral disturbance: Secondary | ICD-10-CM | POA: Diagnosis not present

## 2019-04-22 DIAGNOSIS — S72391D Other fracture of shaft of right femur, subsequent encounter for closed fracture with routine healing: Secondary | ICD-10-CM | POA: Diagnosis not present

## 2019-04-22 DIAGNOSIS — J449 Chronic obstructive pulmonary disease, unspecified: Secondary | ICD-10-CM | POA: Diagnosis not present

## 2019-04-22 DIAGNOSIS — L89626 Pressure-induced deep tissue damage of left heel: Secondary | ICD-10-CM | POA: Diagnosis not present

## 2019-04-25 DIAGNOSIS — J449 Chronic obstructive pulmonary disease, unspecified: Secondary | ICD-10-CM | POA: Diagnosis not present

## 2019-04-25 DIAGNOSIS — L89626 Pressure-induced deep tissue damage of left heel: Secondary | ICD-10-CM | POA: Diagnosis not present

## 2019-04-25 DIAGNOSIS — L89612 Pressure ulcer of right heel, stage 2: Secondary | ICD-10-CM | POA: Diagnosis not present

## 2019-04-25 DIAGNOSIS — S72391D Other fracture of shaft of right femur, subsequent encounter for closed fracture with routine healing: Secondary | ICD-10-CM | POA: Diagnosis not present

## 2019-04-25 DIAGNOSIS — F0391 Unspecified dementia with behavioral disturbance: Secondary | ICD-10-CM | POA: Diagnosis not present

## 2019-04-25 DIAGNOSIS — F419 Anxiety disorder, unspecified: Secondary | ICD-10-CM | POA: Diagnosis not present

## 2019-04-26 DIAGNOSIS — Z87440 Personal history of urinary (tract) infections: Secondary | ICD-10-CM | POA: Diagnosis not present

## 2019-04-26 DIAGNOSIS — F0391 Unspecified dementia with behavioral disturbance: Secondary | ICD-10-CM | POA: Diagnosis not present

## 2019-04-26 DIAGNOSIS — R32 Unspecified urinary incontinence: Secondary | ICD-10-CM | POA: Diagnosis not present

## 2019-04-26 DIAGNOSIS — R41841 Cognitive communication deficit: Secondary | ICD-10-CM | POA: Diagnosis not present

## 2019-04-26 DIAGNOSIS — Z7982 Long term (current) use of aspirin: Secondary | ICD-10-CM | POA: Diagnosis not present

## 2019-04-26 DIAGNOSIS — M6281 Muscle weakness (generalized): Secondary | ICD-10-CM | POA: Diagnosis not present

## 2019-04-26 DIAGNOSIS — Z8781 Personal history of (healed) traumatic fracture: Secondary | ICD-10-CM | POA: Diagnosis not present

## 2019-04-26 DIAGNOSIS — F419 Anxiety disorder, unspecified: Secondary | ICD-10-CM | POA: Diagnosis not present

## 2019-04-26 DIAGNOSIS — Z79891 Long term (current) use of opiate analgesic: Secondary | ICD-10-CM | POA: Diagnosis not present

## 2019-04-26 DIAGNOSIS — S72391D Other fracture of shaft of right femur, subsequent encounter for closed fracture with routine healing: Secondary | ICD-10-CM | POA: Diagnosis not present

## 2019-04-26 DIAGNOSIS — L89626 Pressure-induced deep tissue damage of left heel: Secondary | ICD-10-CM | POA: Diagnosis not present

## 2019-04-26 DIAGNOSIS — J449 Chronic obstructive pulmonary disease, unspecified: Secondary | ICD-10-CM | POA: Diagnosis not present

## 2019-04-26 DIAGNOSIS — F329 Major depressive disorder, single episode, unspecified: Secondary | ICD-10-CM | POA: Diagnosis not present

## 2019-04-28 DIAGNOSIS — R41841 Cognitive communication deficit: Secondary | ICD-10-CM | POA: Diagnosis not present

## 2019-04-28 DIAGNOSIS — J449 Chronic obstructive pulmonary disease, unspecified: Secondary | ICD-10-CM | POA: Diagnosis not present

## 2019-04-28 DIAGNOSIS — F419 Anxiety disorder, unspecified: Secondary | ICD-10-CM | POA: Diagnosis not present

## 2019-04-28 DIAGNOSIS — S72391D Other fracture of shaft of right femur, subsequent encounter for closed fracture with routine healing: Secondary | ICD-10-CM | POA: Diagnosis not present

## 2019-04-28 DIAGNOSIS — L89626 Pressure-induced deep tissue damage of left heel: Secondary | ICD-10-CM | POA: Diagnosis not present

## 2019-04-28 DIAGNOSIS — F0391 Unspecified dementia with behavioral disturbance: Secondary | ICD-10-CM | POA: Diagnosis not present

## 2019-04-29 DIAGNOSIS — R41841 Cognitive communication deficit: Secondary | ICD-10-CM | POA: Diagnosis not present

## 2019-04-29 DIAGNOSIS — S72391D Other fracture of shaft of right femur, subsequent encounter for closed fracture with routine healing: Secondary | ICD-10-CM | POA: Diagnosis not present

## 2019-04-29 DIAGNOSIS — J449 Chronic obstructive pulmonary disease, unspecified: Secondary | ICD-10-CM | POA: Diagnosis not present

## 2019-04-29 DIAGNOSIS — F419 Anxiety disorder, unspecified: Secondary | ICD-10-CM | POA: Diagnosis not present

## 2019-04-29 DIAGNOSIS — F0391 Unspecified dementia with behavioral disturbance: Secondary | ICD-10-CM | POA: Diagnosis not present

## 2019-04-29 DIAGNOSIS — L89626 Pressure-induced deep tissue damage of left heel: Secondary | ICD-10-CM | POA: Diagnosis not present

## 2019-04-30 ENCOUNTER — Ambulatory Visit (INDEPENDENT_AMBULATORY_CARE_PROVIDER_SITE_OTHER): Payer: Medicare Other | Admitting: Orthopedic Surgery

## 2019-04-30 ENCOUNTER — Other Ambulatory Visit: Payer: Self-pay

## 2019-04-30 ENCOUNTER — Encounter: Payer: Self-pay | Admitting: Orthopedic Surgery

## 2019-04-30 VITALS — Ht 63.0 in | Wt 129.0 lb

## 2019-04-30 DIAGNOSIS — F0391 Unspecified dementia with behavioral disturbance: Secondary | ICD-10-CM | POA: Diagnosis not present

## 2019-04-30 DIAGNOSIS — L89624 Pressure ulcer of left heel, stage 4: Secondary | ICD-10-CM

## 2019-04-30 DIAGNOSIS — R41841 Cognitive communication deficit: Secondary | ICD-10-CM | POA: Diagnosis not present

## 2019-04-30 DIAGNOSIS — M24569 Contracture, unspecified knee: Secondary | ICD-10-CM

## 2019-04-30 DIAGNOSIS — L89626 Pressure-induced deep tissue damage of left heel: Secondary | ICD-10-CM | POA: Diagnosis not present

## 2019-04-30 DIAGNOSIS — J449 Chronic obstructive pulmonary disease, unspecified: Secondary | ICD-10-CM | POA: Diagnosis not present

## 2019-04-30 DIAGNOSIS — S72391D Other fracture of shaft of right femur, subsequent encounter for closed fracture with routine healing: Secondary | ICD-10-CM | POA: Diagnosis not present

## 2019-04-30 DIAGNOSIS — M24559 Contracture, unspecified hip: Secondary | ICD-10-CM

## 2019-04-30 DIAGNOSIS — M25551 Pain in right hip: Secondary | ICD-10-CM

## 2019-04-30 DIAGNOSIS — F419 Anxiety disorder, unspecified: Secondary | ICD-10-CM | POA: Diagnosis not present

## 2019-04-30 NOTE — Progress Notes (Signed)
Office Visit Note   Patient: Desiree Salinas           Date of Birth: March 27, 1950           MRN: [id number] Visit Date: 04/30/2019              Requested by: [name], MD [address] [place],  [place] [address] PCP: [name], MD  Chief Complaint  Patient presents with  . Right Hip - Follow-up    Right hip IM nailing of intertrochanteric fracture on 11/26/2018      HPI: Patient is a 69 year old woman with advanced dementia status post right hip fracture with migration of the hardware which required removal of the hardware 5 months ago.  The caregiver is with her and states there is no drainage at this time.  Patient is currently wearing a PRAFO on the left during the day for her left heel decubitus ulcer.  Assessment & Plan: Visit Diagnoses:  1. Pain in right hip   2. Flexion contracture of knee, unspecified laterality   3. Hip contracture, unspecified laterality   4. Pressure injury of left heel, stage 4 (HCC)     Plan: Recommended the PRAFO be worn around-the-clock continue with dressing changes routine to the left heel.  Patient was given a note that states that due to her advanced dementia she is unable to manage her financial affairs.  Patient may be weightbearing as tolerated for assisted transfers and therapy will continue to work with range of motion of the lower extremity contractures.  Follow-Up Instructions: Return if symptoms worsen or fail to improve.   Ortho Exam  Patient is alert, oriented, no adenopathy, well-dressed, normal affect, normal respiratory effort. Examination patient has developed windswept deformity to both lower extremities with contractures of the hip knee and ankle.  Imaging: No results found. No images are attached to the encounter.  Labs: Lab Results  Component Value Date   REPTSTATUS 01/05/2019 FINAL 01/04/2019   CULT  01/04/2019    NO GROWTH Performed at [facility], [place] [address], [place], [place] [address]    LABORGA NO GROWTH 03/14/2015     Lab Results  Component Value Date   ALBUMIN 2.7 (L) 12/31/2018   ALBUMIN 2.5 (L) 12/29/2018   ALBUMIN 2.3 (L) 11/30/2018    Lab Results  Component Value Date   MG 2.2 11/05/2017   Lab Results  Component Value Date   VD25OH 40.01 11/05/2017   VD25OH 40.01 08/24/2013   VD25OH 38 08/22/2012    No results found for: PREALBUMIN CBC EXTENDED Latest Ref Rng & Units 01/07/2019 01/06/2019 01/05/2019  WBC 4.0 - 10.5 K/uL 10.8(H) 10.6(H) 14.5(H)  RBC 3.87 - 5.11 MIL/uL 3.05(L) 3.15(L) 3.07(L)  HGB 12.0 - 15.0 g/dL 9.0(L) 9.3(L) 9.1(L)  HCT 36.0 - 46.0 % 28.2(L) 29.2(L) 28.7(L)  PLT 150 - 400 K/uL 524(H) 421(H) 543(H)  NEUTROABS 1.7 - 7.7 K/uL - - -  LYMPHSABS 0.7 - 4.0 K/uL - - -     Body mass index is 22.85 kg/m.  Orders:  No orders of the defined types were placed in this encounter.  No orders of the defined types were placed in this encounter.    Procedures: No procedures performed  Clinical Data: No additional findings.  ROS:  All other systems negative, except as noted in the HPI. Review of Systems  Objective: Vital Signs: Ht 5\' 3"  (1.6 m)   Wt 129 lb (58.5 kg)   BMI 22.85 kg/m  Specialty Comments:  No specialty comments available.  PMFS History: Patient Active Problem List   Diagnosis Date Noted  . Periprosthetic fracture of hip 12/24/2018  . Hip fracture ([place]) 11/26/2018  . Fall at home, initial encounter 11/26/2018  . Leukocytosis 11/26/2018  . COPD (chronic obstructive pulmonary disease) ([place]) 11/26/2018  . Intertrochanteric fracture of right hip ([place]) 11/26/2018  . Closed intertrochanteric fracture of hip, right, initial encounter ([place])   . 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 Iron deficiency anemia 07/15/2018  . Fall 06/09/2018  . 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 Degenerative scoliosis 02/10/2018  . Diarrhea 10/14/2013  . Cigarette smoker 08/08/2011  . VAIN I (vaginal intraepithelial  neoplasia grade I)   . COLONIC POLYPS 12/21/2007  . ALLERGIC RHINITIS 12/21/2007  . 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 HYPERCHOLESTEROLEMIA 07/05/2007  . 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 DERMATITIS 07/05/2007  . SHINGLES, HX OF 07/05/2007   Past Medical History:  Diagnosis Date  . ADD (attention deficit disorder)   . Allergy   . Anemia   . Anxiety   . Arthritis   . Bruised rib 2020  . Cataract   . Colon polyps    HYPERPLASTIC POLYP  . COPD (chronic obstructive pulmonary disease) ([place])   . Dementia ([place])   . Depression   . Elevated cholesterol   . Shingles   . STD (sexually transmitted disease)    Condylomata  . Substance abuse ([place])    Stopped drinking one year ago.   Marland Kitchen 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    Family History  Problem Relation Age of Onset  . Breast cancer Mother        Age 69's  . Diabetes Maternal Grandmother   . Aneurysm Maternal Grandmother   . Aneurysm Maternal Aunt   . Aneurysm Maternal Grandfather   . Ankylosing spondylitis Father   . Colon cancer Neg Hx   . Colon polyps Neg Hx   . Esophageal cancer Neg Hx   . Rectal cancer Neg Hx   . Stomach cancer Neg Hx   . Dementia Neg Hx     Past Surgical History:  Procedure Laterality Date  . ABDOMINAL HYSTERECTOMY  1999   TAH  . ABDOMINAL SURGERY  1999   Abdominoplasty  . ACHILLES TENDON SURGERY    . AUGMENTATION MAMMAPLASTY     Implants  . COLONOSCOPY  2006  . HARDWARE REMOVAL Right 12/26/2018   Procedure: REMOVE NAIL RIGHT FEMUR;  Surgeon: Newt Minion, MD;  Location: [facility];  Service: Orthopedics;  Laterality: Right;  . INTRAMEDULLARY (IM) NAIL INTERTROCHANTERIC Right 11/26/2018   Procedure(s) Performed: INTRAMEDULLARY (IM) NAIL INTERTROCHANTRIC RIGHT HIP (Right Hip)  . INTRAMEDULLARY (IM) NAIL INTERTROCHANTERIC Right 11/26/2018   Procedure: INTRAMEDULLARY (IM) NAIL INTERTROCHANTRIC RIGHT HIP;  Surgeon: Newt Minion, MD;   Location: [facility];  Service: Orthopedics;  Laterality: Right;  . INTRAMEDULLARY (IM) NAIL INTERTROCHANTERIC Right 12/26/2018   Procedure: INTRAMEDULLARY LONG INTERTROCHANTRIC NAIL;  Surgeon: Newt Minion, MD;  Location: [facility];  Service: Orthopedics;  Laterality: Right;  . POLYPECTOMY    . TONSILLECTOMY AND ADENOIDECTOMY    . TUBAL LIGATION     Social History   Occupational History  . Occupation: retired  Tobacco Use  . Smoking status: Former Smoker    Packs/day: 0.90    Types: Cigarettes  . Smokeless tobacco: Never Used  . Tobacco comment: 10 cigarettes per day as of 04/17/2018.  Not smoked since the fall as of 06/11/2018  Substance and Sexual Activity  . Alcohol use: Not Currently    Alcohol/week: 0.0 standard drinks    Comment: none since May 2019  . Drug use: Not Currently    Types: Marijuana  . Sexual activity: Never    Birth control/protection: Post-menopausal, Surgical    Comment: 1st intercourse 69 yo-More than 5 partners

## 2019-05-01 ENCOUNTER — Encounter: Payer: Self-pay | Admitting: Orthopedic Surgery

## 2019-05-05 DIAGNOSIS — R41841 Cognitive communication deficit: Secondary | ICD-10-CM | POA: Diagnosis not present

## 2019-05-05 DIAGNOSIS — J449 Chronic obstructive pulmonary disease, unspecified: Secondary | ICD-10-CM | POA: Diagnosis not present

## 2019-05-05 DIAGNOSIS — F0391 Unspecified dementia with behavioral disturbance: Secondary | ICD-10-CM | POA: Diagnosis not present

## 2019-05-05 DIAGNOSIS — F419 Anxiety disorder, unspecified: Secondary | ICD-10-CM | POA: Diagnosis not present

## 2019-05-05 DIAGNOSIS — L89626 Pressure-induced deep tissue damage of left heel: Secondary | ICD-10-CM | POA: Diagnosis not present

## 2019-05-05 DIAGNOSIS — S72391D Other fracture of shaft of right femur, subsequent encounter for closed fracture with routine healing: Secondary | ICD-10-CM | POA: Diagnosis not present

## 2019-05-07 DIAGNOSIS — J449 Chronic obstructive pulmonary disease, unspecified: Secondary | ICD-10-CM | POA: Diagnosis not present

## 2019-05-07 DIAGNOSIS — R41841 Cognitive communication deficit: Secondary | ICD-10-CM | POA: Diagnosis not present

## 2019-05-07 DIAGNOSIS — L89626 Pressure-induced deep tissue damage of left heel: Secondary | ICD-10-CM | POA: Diagnosis not present

## 2019-05-07 DIAGNOSIS — F0391 Unspecified dementia with behavioral disturbance: Secondary | ICD-10-CM | POA: Diagnosis not present

## 2019-05-07 DIAGNOSIS — F419 Anxiety disorder, unspecified: Secondary | ICD-10-CM | POA: Diagnosis not present

## 2019-05-07 DIAGNOSIS — M545 Low back pain: Secondary | ICD-10-CM | POA: Diagnosis not present

## 2019-05-07 DIAGNOSIS — S72391D Other fracture of shaft of right femur, subsequent encounter for closed fracture with routine healing: Secondary | ICD-10-CM | POA: Diagnosis not present

## 2019-05-09 ENCOUNTER — Encounter: Payer: Self-pay | Admitting: Orthopedic Surgery

## 2019-05-12 ENCOUNTER — Telehealth: Payer: Self-pay | Admitting: Orthopedic Surgery

## 2019-05-12 DIAGNOSIS — L89626 Pressure-induced deep tissue damage of left heel: Secondary | ICD-10-CM | POA: Diagnosis not present

## 2019-05-12 DIAGNOSIS — F0391 Unspecified dementia with behavioral disturbance: Secondary | ICD-10-CM | POA: Diagnosis not present

## 2019-05-12 DIAGNOSIS — J449 Chronic obstructive pulmonary disease, unspecified: Secondary | ICD-10-CM | POA: Diagnosis not present

## 2019-05-12 DIAGNOSIS — S72391D Other fracture of shaft of right femur, subsequent encounter for closed fracture with routine healing: Secondary | ICD-10-CM | POA: Diagnosis not present

## 2019-05-12 DIAGNOSIS — F419 Anxiety disorder, unspecified: Secondary | ICD-10-CM | POA: Diagnosis not present

## 2019-05-12 DIAGNOSIS — R41841 Cognitive communication deficit: Secondary | ICD-10-CM | POA: Diagnosis not present

## 2019-05-12 NOTE — Telephone Encounter (Signed)
Chakilrra with [facility] called. Says the patient is wearing the hard boot while sleeping at night and should be wearing the soft protective heel boot instead. Just want to make Sharol Given aware of this. Her call back number is [phone]

## 2019-05-13 ENCOUNTER — Telehealth (INDEPENDENT_AMBULATORY_CARE_PROVIDER_SITE_OTHER): Payer: Medicare Other | Admitting: Family Medicine

## 2019-05-13 ENCOUNTER — Other Ambulatory Visit: Payer: Self-pay

## 2019-05-13 DIAGNOSIS — F0150 Vascular dementia without behavioral disturbance: Secondary | ICD-10-CM
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
## 2019-05-14 DIAGNOSIS — R41841 Cognitive communication deficit: Secondary | ICD-10-CM | POA: Diagnosis not present

## 2019-05-14 DIAGNOSIS — F0391 Unspecified dementia with behavioral disturbance: Secondary | ICD-10-CM | POA: Diagnosis not present

## 2019-05-14 DIAGNOSIS — F419 Anxiety disorder, unspecified: Secondary | ICD-10-CM | POA: Diagnosis not present

## 2019-05-14 DIAGNOSIS — S72391D Other fracture of shaft of right femur, subsequent encounter for closed fracture with routine healing: Secondary | ICD-10-CM | POA: Diagnosis not present

## 2019-05-14 DIAGNOSIS — L89626 Pressure-induced deep tissue damage of left heel: Secondary | ICD-10-CM | POA: Diagnosis not present

## 2019-05-14 DIAGNOSIS — J449 Chronic obstructive pulmonary disease, unspecified: Secondary | ICD-10-CM | POA: Diagnosis not present

## 2019-05-19 DIAGNOSIS — L89626 Pressure-induced deep tissue damage of left heel: Secondary | ICD-10-CM | POA: Diagnosis not present

## 2019-05-19 DIAGNOSIS — R41841 Cognitive communication deficit: Secondary | ICD-10-CM | POA: Diagnosis not present

## 2019-05-19 DIAGNOSIS — F419 Anxiety disorder, unspecified: Secondary | ICD-10-CM | POA: Diagnosis not present

## 2019-05-19 DIAGNOSIS — F0391 Unspecified dementia with behavioral disturbance: Secondary | ICD-10-CM | POA: Diagnosis not present

## 2019-05-19 DIAGNOSIS — J449 Chronic obstructive pulmonary disease, unspecified: Secondary | ICD-10-CM | POA: Diagnosis not present

## 2019-05-19 DIAGNOSIS — S72391D Other fracture of shaft of right femur, subsequent encounter for closed fracture with routine healing: Secondary | ICD-10-CM | POA: Diagnosis not present

## 2019-05-21 DIAGNOSIS — R41841 Cognitive communication deficit: Secondary | ICD-10-CM | POA: Diagnosis not present

## 2019-05-21 DIAGNOSIS — F0391 Unspecified dementia with behavioral disturbance: Secondary | ICD-10-CM | POA: Diagnosis not present

## 2019-05-21 DIAGNOSIS — L89626 Pressure-induced deep tissue damage of left heel: Secondary | ICD-10-CM | POA: Diagnosis not present

## 2019-05-21 DIAGNOSIS — F419 Anxiety disorder, unspecified: Secondary | ICD-10-CM | POA: Diagnosis not present

## 2019-05-21 DIAGNOSIS — J449 Chronic obstructive pulmonary disease, unspecified: Secondary | ICD-10-CM | POA: Diagnosis not present

## 2019-05-21 DIAGNOSIS — S72391D Other fracture of shaft of right femur, subsequent encounter for closed fracture with routine healing: Secondary | ICD-10-CM | POA: Diagnosis not present

## 2019-05-22 ENCOUNTER — Other Ambulatory Visit: Payer: Self-pay

## 2019-05-22 ENCOUNTER — Encounter (HOSPITAL_BASED_OUTPATIENT_CLINIC_OR_DEPARTMENT_OTHER): Payer: Medicare Other | Attending: Internal Medicine | Admitting: Internal Medicine

## 2019-05-22 DIAGNOSIS — Z87891 Personal history of nicotine dependence: Secondary | ICD-10-CM | POA: Diagnosis not present

## 2019-05-22 DIAGNOSIS — L89624 Pressure ulcer of left heel, stage 4: Secondary | ICD-10-CM | POA: Insufficient documentation

## 2019-05-22 DIAGNOSIS — Z96641 Presence of right artificial hip joint: Secondary | ICD-10-CM | POA: Insufficient documentation

## 2019-05-22 DIAGNOSIS — F0150 Vascular dementia without behavioral disturbance: Secondary | ICD-10-CM | POA: Diagnosis not present

## 2019-05-22 DIAGNOSIS — J449 Chronic obstructive pulmonary disease, unspecified: Secondary | ICD-10-CM | POA: Insufficient documentation

## 2019-05-22 DIAGNOSIS — M79672 Pain in left foot: Secondary | ICD-10-CM | POA: Diagnosis not present

## 2019-05-24 LAB — AEROBIC CULTURE W GRAM STAIN (SUPERFICIAL SPECIMEN): Culture: NORMAL

## 2019-05-26 DIAGNOSIS — L89626 Pressure-induced deep tissue damage of left heel: Secondary | ICD-10-CM | POA: Diagnosis not present

## 2019-05-26 NOTE — Progress Notes (Signed)
Desiree Salinas, Desiree Salinas ([id number]) Visit Report for 05/22/2019 Allergy List Details Patient Name: Date of Service: Desiree Salinas, Desiree Salinas 05/22/2019 1:15 PM Medical Record [id number] Patient Account Number: [id number] Date of Birth/Sex: Treating RN: Desiree Salinas Allergies Active Allergies latex Reaction: rash nickel Reaction: hives, itching cobalt Reaction: rash Eucerin Reaction: hives Allergy Notes Electronic Signature(s) Signed: 05/26/2019 5:59:14 PM By: Baruch Gouty RN, BSN Entered By: Baruch Gouty on 05/22/2019 13:26:38 -------------------------------------------------------------------------------- Arrival Information Details Patient Name: Date of Service: Desiree Salinas 05/22/2019 1:15 PM Medical Record [id number] Patient Account Number: [id number] Date of Birth/Sex: Treating RN: [birth year] (69 y.o. Desiree Salinas: [name] Other Clinician: Referring Maxim Bedel: Treating Everson Mott/Extender:Desiree Salinas, Desiree Salinas in Treatment: Salinas Visit Information Patient Arrived: Wheel Chair Arrival Time: 13:03 Accompanied By: caregiver Transfer Assistance: Manual Patient Identification Verified: Yes Secondary Verification Process Completed: Yes Patient Requires Transmission-Based No Precautions: Patient Has Alerts: No Electronic Signature(s) Signed: 05/26/2019 5:59:14 PM By: Baruch Gouty RN, BSN Entered By: Baruch Gouty on 05/22/2019 13:23:44 -------------------------------------------------------------------------------- Clinic Level of Care Assessment Details Patient Name: Date of Service: Desiree Salinas, Desiree Salinas 05/22/2019 1:15 PM Medical Record [id number] Patient Account Number: [id number] Date of Birth/Sex: Treating RN: Salinas[birth year] (69  y.o. Desiree Salinas Primary Care Wendelin Reader: [name] Other Clinician: Referring Antasia Haider: Treating Mariana Goytia/Extender:Desiree Salinas, Desiree Salinas in Treatment: Salinas Clinic Level of Care Assessment Items TOOL 1 Quantity Score X - Use when EandM and Procedure is performed on INITIAL visit 1 [place]inas ASSESSMENTS - Nursing Assessment / Reassessment X - General Physical Exam (combine w/ comprehensive assessment (listed just below) 1 20 when performed on new pt. evals) X - Comprehensive Assessment (HX, ROS, Risk Assessments, Wounds Hx, etc.) 1 25 ASSESSMENTS - Wound and Skin Assessment / Reassessment []  - Dermatologic / Skin Assessment (not related to wound area) Salinas ASSESSMENTS - Ostomy and/or Continence Assessment and Care []  - Incontinence Assessment and Management Salinas []  - Ostomy Care Assessment and Management (repouching, etc.) Salinas PROCESS - Coordination of Care X - Simple Patient / Family Education for ongoing care 1 15 []  - Complex (extensive) Patient / Family Education for ongoing care Salinas X - Staff obtains Programmer, systems, Records, Test Results / Process Orders 1 10 X - Staff telephones HHA, Nursing Homes / Clarify orders / etc 1 10 []  - Routine Transfer to another Facility (non-emergent condition) Salinas []  - Routine Hospital Admission (non-emergent condition) [place] X - New Admissions / Biomedical engineer / Ordering NPWT, Apligraf, etc. 1 15 []  - Emergency Hospital Admission (emergent condition) Sal[place] PROCESS - Special Needs []  - Pediatric / Minor Patient Management Sal[place] []  - Isolation Patient Management Sal[place] []  - Hearing / Language / Visual special needs Sal[place] []  - Assessment of Community assistance (transportation, D/C planning, etc.) [place] []  - Additional assistance / Altered mentation Salinas []  - Support Surface(s) Assessment (bed, cushion, seat, etc.) Salinas INTERVENTIONS - Miscellaneous []  - External ear exam Salinas []  - Patient Transfer (multiple staff / Civil Service fast streamer / Similar devices) [place] []  - Simple Staple /  Suture removal (25 or less) Salinas []  - Complex Staple / Suture removal (26 or more) Salinas []  - Hypo/Hyperglycemic Management (do not check if billed separately) Salinas X - Ankle / Brachial Index (ABI) - do not check if billed separately 1 15 Has the patient been seen at the hospital within the last three years: Yes Total Score:  110 Level Of Care: New/Established - Level 3 Electronic Signature(s) Signed: 05/22/2019 5:10:17 PM By: Kela Millin Entered By: Kela Millin on 05/22/2019 14:33:04 -------------------------------------------------------------------------------- Encounter Discharge Information Details Patient Name: Date of Service: Desiree Salinas 05/22/2019 1:15 PM Medical Record [id number] Patient Account Number: [id number] Date of Birth/Sex: Treating RN: [birth year]Salinas (69 y.o. Debby Bud Primary Care Sherline Eberwein: [name] Other Clinician: Referring Treyson Axel: Treating Eivan Gallina/Extender:Desiree Salinas, Desiree Salinas in Treatment: Salinas Encounter Discharge Information Items Post Procedure Vitals Discharge Condition: Stable Temperature (F): 98 Ambulatory Status: Wheelchair Pulse (bpm): 82 Discharge Destination: [place] Respiratory Rate (breaths/min): 18 Telephoned: No Blood Pressure (mmHg): 102/56 Orders Sent: Yes Transportation: Private Auto Accompanied By: caregiver Schedule Follow-up Appointment: Yes Clinical Summary of Care: Electronic Signature(s) Signed: 05/22/2019 5:19:35 PM By: Deon Pilling Entered By: Deon Pilling on 05/22/2019 14:58:57 -------------------------------------------------------------------------------- Lower Extremity Assessment Details Patient Name: Date of Service: Desiree Salinas, Desiree Salinas 05/22/2019 1:15 PM Medical Record [id number] Patient Account Number: [id number] Date of Birth/Sex: Treating RN: [birth year] Salinas (69 y.o. Desiree Salinas Primary Care Kealie Barrie: [name] Other Clinician: Referring Kimorah Ridolfi:  Treating Zaila Crew/Extender:Desiree Salinas, Desiree Salinas in Treatment: Salinas Edema Assessment Assessed: [Left: No] [Right: No] Edema: [Left: N] [Right: o] Calf Left: Right: Point of Measurement: cm From Medial Instep 26 cm cm Ankle Left: Right: Point of Measurement: cm From Medial Instep 18.5 cm cm Vascular Assessment Pulses: Dorsalis Pedis Palpable: [Left:Yes] Blood Pressure: Brachial: [Left:102] Ankle: [Left:Posterior Tibial: 120 1.18] Electronic Signature(s) Signed: 05/26/2019 5:59:14 PM By: Baruch Gouty RN, BSN Entered By: Baruch Gouty on 05/22/2019 13:47:42 -------------------------------------------------------------------------------- Multi Wound Chart Details Patient Name: Date of Service: Desiree Salinas 05/22/2019 1:15 PM Medical Record [id number] Patient Account Number: [id number] Date of Birth/Sex: Treating RN: [birth year] Salinas (69 y.o. Desiree Salinas Primary Care Erryn Dickison: [name] Other Clinician: Referring Lemonte Al: Treating Ashdon Gillson/Extender:Desiree Salinas, Desiree Salinas in Treatment: Salinas Vital Signs Height(in): 67 Pulse(bpm): 82 Weight(lbs): 107 Blood Pressure(mmHg): 102/56 Body Mass Index(BMI): 17 Temperature(F): 98.Salinas Respiratory 18 Rate(breaths/min): Photos: [1:No Photos] [N/A:N/A] Wound Location: [1:Left Calcaneus] [N/A:N/A] Wounding Event: [1:Pressure Injury] [N/A:N/A] Primary Etiology: [1:Pressure Ulcer] [N/A:N/A] Comorbid History: [1:Cataracts, Anemia, Chronic N/A Obstructive Pulmonary Disease (COPD), Dementia, Confinement Anxiety] Date Acquired: [1:02/22/2019] [N/A:N/A] Salinas of Treatment: [1:Salinas] [N/A:N/A] Wound Status: [1:Open] [N/A:N/A] Measurements L x W x D 1.9x2.6x0.5 [N/A:N/A] (cm) Area (cm) : [1:3.88] [N/A:N/A] Volume (cm) : [1:1.94] [N/A:N/A] Classification: [1:Unstageable/Unclassified N/A] Exudate Amount: [1:Small] [N/A:N/A] Exudate Type: [1:Serosanguineous] [N/A:N/A] Exudate Color: [1:red,  brown] [N/A:N/A] Wound Margin: [1:Flat and Intact] [N/A:N/A] Granulation Amount: [1:None Present (Salinas%)] [N/A:N/A] Necrotic Amount: [1:Large (67-100%)] [N/A:N/A] Exposed Structures: [1:Fat Layer (Subcutaneous N/A Tissue) Exposed: Yes Fascia: No Tendon: No Muscle: No Joint: No Bone: No] Epithelialization: [1:None] [N/A:N/A] Debridement: [1:Debridement - Excisional N/A] Pre-procedure [1:14:20] [N/A:N/A] Verification/Time Out Taken: Pain Control: [1:Other] [N/A:N/A] Tissue Debrided: [1:Necrotic/Eschar, Subcutaneous, Slough] [N/A:N/A] Level: [1:Skin/Subcutaneous Tissue] [N/A:N/A] Debridement Area (sq cm):4.94 [N/A:N/A] Instrument: [1:Curette] [N/A:N/A] Bleeding: [1:Minimum] [N/A:N/A] Hemostasis Achieved: [1:Pressure] [N/A:N/A] Procedural Pain: [1:1] [N/A:N/A] Post Procedural Pain: [1:Salinas] [N/A:N/A] Debridement Treatment Procedure was tolerated [N/A:N/A] Response: [1:well] Post Debridement [1:1.9x2.6x1] [N/A:N/A] Measurements L x W x D (cm) Post Debridement [1:3.88] [N/A:N/A] Volume: (cm) Post Debridement Stage: Unstageable/Unclassified [N/A:N/A N/A] Treatment Notes Electronic Signature(s) Signed: 05/22/2019 5:09:02 PM By: Linton Ham MD Signed: 05/22/2019 5:10:17 PM By: Kela Millin Entered By: Linton Ham on 05/22/2019 14:38:57 -------------------------------------------------------------------------------- Multi-Disciplinary Care Plan Details Patient Name: Date of Service: Desiree Salinas 05/22/2019 1:15 PM Medical Record [id number] Patient Account Number: [id number] Date of Birth/Sex: Treating RN: [birth year]Salinas (69 y.o. F) Dwiggins, Larene Beach  Primary Care Cherice Glennie: [name] Other Clinician: Referring Abeeha Twist: Treating Kania Regnier/Extender:Desiree Salinas, Desiree Salinas in Treatment: Salinas Active Inactive Pain, Acute or Chronic Nursing Diagnoses: Pain, acute or chronic: actual or potential Goals: Patient/caregiver will verbalize adequate pain control  between visits Date Initiated: 05/22/2019 Target Resolution Date: 06/19/2019 Goal Status: Active Interventions: Provide education on pain management Notes: Pressure Nursing Diagnoses: Knowledge deficit related to causes and risk factors for pressure ulcer development Goals: Patient/caregiver will verbalize understanding of pressure ulcer management Date Initiated: 05/22/2019 Target Resolution Date: 06/19/2019 Goal Status: Active Interventions: Provide education on pressure ulcers Notes: Wound/Skin Impairment Nursing Diagnoses: Impaired tissue integrity Goals: Ulcer/skin breakdown will have a volume reduction of 50% by week 8 Date Initiated: 05/22/2019 Target Resolution Date: 06/19/2019 Goal Status: Active Interventions: Provide education on ulcer and skin care Notes: Electronic Signature(s) Signed: 05/22/2019 5:10:17 PM By: Kela Millin Entered By: Kela Millin on 05/22/2019 14:23:55 -------------------------------------------------------------------------------- Pain Assessment Details Patient Name: Date of Service: Desiree Salinas, Desiree Salinas 05/22/2019 1:15 PM Medical Record [id number] Patient Account Number: [id number] Date of Birth/Sex: Treating RN: [birth year] Salinas (69 y.o. Desiree Salinas Primary Care Brysyn Brandenberger: [name] Other Clinician: Referring Shironda Kain: Treating Joclyn Alsobrook/Extender:Desiree Salinas, Desiree Salinas in Treatment: Salinas Active Problems Location of Pain Severity and Description of Pain Patient Has Paino Yes Site Locations Pain Location: Pain in Ulcers With Dressing Change: Yes Rate the pain. Current Pain Level: Salinas Worst Pain Level: 6 Least Pain Level: Salinas Character of Pain Describe the Pain: Aching, Tender Pain Management and Medication Current Pain Management: Medication: Yes Other: reposition Is the Current Pain Management Adequate: Adequate How does your wound impact your activities of daily livingo Sleep: No Bathing: No Appetite:  No Relationship With Others: No Bladder Continence: No Emotions: No Bowel Continence: No Hobbies: No Toileting: No Dressing: No Electronic Signature(s) Signed: 05/26/2019 5:59:14 PM By: Baruch Gouty RN, BSN Entered By: Baruch Gouty on 05/22/2019 13:53:47 -------------------------------------------------------------------------------- Patient/Caregiver Education Details Patient Name: Date of Service: Desiree Salinas 3/19/2021andnbsp1:15 PM Medical Record [id number] Patient Account Number: [id number] Date of Birth/Gender: [birth year] Salinas (68 y.o. F) Treating RN: Kela Millin Primary Care Physician: [name] Other Clinician: Referring Physician: Treating Physician/Extender:Desiree Salinas, Desiree Salinas in Treatment: Salinas Education Assessment Education Provided To: Caregiver Education Topics Provided Pain: Methods: Explain/Verbal Responses: State content correctly Pressure: Methods: Explain/Verbal Responses: State content correctly Wound/Skin Impairment: Methods: Explain/Verbal Responses: State content correctly Electronic Signature(s) Signed: 05/22/2019 5:10:17 PM By: Kela Millin Entered By: Kela Millin on 05/22/2019 14:32:22 -------------------------------------------------------------------------------- Wound Assessment Details Patient Name: Date of Service: Desiree Salinas, Desiree Salinas 05/22/2019 1:15 PM Medical Record [id number] Patient Account Number: [id number] Date of Birth/Sex: Treating RN: [birth year] (69 y.o. Desiree Salinas Primary Care Menashe Kafer: [name] Other Clinician: Referring Farzad Tibbetts: Treating Marium Ragan/Extender:Desiree Salinas, Desiree Salinas in Treatment: Salinas Wound Status Wound Number: 1 Primary Pressure Ulcer Etiology: Wound Location: Left Calcaneus Wound Open Wounding Event: Pressure Injury Status: Date Acquired: 02/22/2019 Comorbid Cataracts, Anemia, Chronic Obstructive Salinas Of Treatment:  Salinas History: Pulmonary Disease (COPD), Dementia, Clustered Wound: No Confinement Anxiety Wound Measurements Length: (cm) 1.9 Width: (cm) 2.6 Depth: (cm) Salinas.5 Area: (cm) 3.88 Volume: (cm) 1.94 Wound Description Classification: Category/Stage IV Wound Margin: Flat and Intact Exudate Amount: Small Exudate Type: Serosanguineous Exudate Color: red, brown Wound Bed Granulation Amount: None Present (Salinas%) Necrotic Amount: Large (67-100%) Necrotic Quality: Adherent Slough After Cleansing: No brino Yes Exposed Structure osed: No (Subcutaneous Tissue) Exposed: Yes osed: No osed: No xposed: No posed: No % Reduction in Area: Salinas% % Reduction in Volume: Salinas%  Epithelialization: None Tunneling: No Undermining: No Foul Odor Slough/Fi Fascia Exp Fat Layer Tendon Exp Muscle Exp Joint E Bone Ex Treatment Notes Wound #1 (Left Calcaneus) 1. Cleanse With Wound Cleanser 2. Periwound Care Skin Prep 3. Primary Dressing Applied Santyl Other primary dressing (specifiy in notes) 4. Secondary Dressing Dry Gauze Roll Gauze Heel Cup 5. Secured With Medipore tape 7. Footwear/Offloading device applied Multipodus Splint/Boot Notes saline moisten gauze backing santyl. explained to caregiver how to apply dressings and when to return to wound center. Electronic Signature(s) Signed: 05/22/2019 5:10:17 PM By: Kela Millin Entered By: Kela Millin on 05/22/2019 14:43:30 -------------------------------------------------------------------------------- Vitals Details Patient Name: Date of Service: Desiree Salinas 05/22/2019 1:15 PM Medical Record [id number] Patient Account Number: [id number] Date of Birth/Sex: Treating RN: [birth year] (69 y.o. Desiree Salinas Primary Care Kadien Lineman: [name] Other Clinician: Referring Tanza Pellot: Treating Rehanna Oloughlin/Extender:Desiree Salinas, Desiree Salinas in Treatment: Salinas Vital Signs Time Taken: 13:24 Temperature (F):  98.Salinas Height (in): 67 Pulse (bpm): 82 Source: Stated Respiratory Rate (breaths/min): 18 Weight (lbs): 107 Blood Pressure (mmHg): 102/56 Source: Stated Reference Range: 80 - 120 mg / dl Body Mass Index (BMI): 16.8 Electronic Signature(s) Signed: 05/26/2019 5:59:14 PM By: Baruch Gouty RN, BSN Entered By: Baruch Gouty on 05/22/2019 13:25:14

## 2019-05-26 NOTE — Progress Notes (Addendum)
KAMYLLE, DIDIER ([id number]) Visit Report for 05/22/2019 Chief Complaint Document Details Patient Name: Date of Service: Desiree Salinas, Desiree Salinas 05/22/2019 1:15 PM Medical Record [id number] Patient Account Number: [id number] Date of Birth/Sex: Treating RN: June 09, 1950 (69 y.o. Clearnce Sorrel Primary Care Provider: [name] Other Clinician: Referring Provider: Treating Provider/Extender:Nicholas Trompeter, Yehuda Savannah, Nat Math in Treatment: 0 Information Obtained from: Patient Chief Complaint 05/22/2019; patient is here for review of a pressure ulcer on the tip of her left heel Electronic Signature(s) Signed: 05/22/2019 5:09:02 PM By: Linton Ham MD Entered By: Linton Ham on 05/22/2019 14:39:30 -------------------------------------------------------------------------------- Debridement Details Patient Name: Date of Service: Ronaldo Miyamoto 05/22/2019 1:15 PM Medical Record [id number] Patient Account Number: [id number] Date of Birth/Sex: Treating RN: October 03, 1950 (69 y.o. Clearnce Sorrel Primary Care Provider: [name] Other Clinician: Referring Provider: Treating Provider/Extender:Collin Hendley, Yehuda Savannah, Nat Math in Treatment: 0 Debridement Performed for Wound #1 Left Calcaneus Assessment: Performed By: Physician Ricard Dillon., MD Debridement Type: Debridement Level of Consciousness (Pre- Awake and Alert procedure): Pre-procedure Verification/Time Out Taken: Yes - 14:20 Start Time: 14:20 Pain Control: Other : benzocaine, 20% Total Area Debrided (L x W): 1.9 (cm) x 2.6 (cm) = 4.94 (cm) Tissue and other material Viable, Non-Viable, Eschar, Slough, Subcutaneous, Slough debrided: Level: Skin/Subcutaneous Tissue Debridement Description: Excisional Instrument: Curette Bleeding: Minimum Hemostasis Achieved: Pressure End Time: 14:21 Procedural Pain: 1 Post Procedural Pain: 0 Response to Treatment: Procedure was tolerated well Level of  Consciousness Awake and Alert (Post-procedure): Post Debridement Measurements of Total Wound Length: (cm) 1.9 Stage: Unstageable/Unclassified Width: (cm) 2.6 Depth: (cm) 1 Volume: (cm) 3.88 Character of Wound/Ulcer Post Requires Further Debridement Debridement: Post Procedure Diagnosis Same as Pre-procedure Electronic Signature(s) Signed: 05/22/2019 5:09:02 PM By: Linton Ham MD Signed: 05/22/2019 5:10:17 PM By: Kela Millin Entered By: Linton Ham on 05/22/2019 14:39:07 -------------------------------------------------------------------------------- HPI Details Patient Name: Date of Service: Ronaldo Miyamoto 05/22/2019 1:15 PM Medical Record [id number] Patient Account Number: [id number] Date of Birth/Sex: Treating RN: 1950/04/05 (69 y.o. Clearnce Sorrel Primary Care Provider: [name] Other Clinician: Referring Provider: Treating Provider/Extender:Overton Boggus, Yehuda Savannah, Nat Math in Treatment: 0 History of Present Illness HPI Description: ADMISSION 05/22/2019 This is a 69 year old woman with advanced vascular dementia. She lives at [facility] assisted living and comes in with her caregiver. The story is that she fractured her right hip in November 2020 was hospitalized was discharged to [facility] skilled facility and then return to [facility] assisted living. Upon her return there she was discovered to have a wound on her left heel. She is being dressed with Santyl. She has [facility]. The exact duration of this wound is not really clear. 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 ABIs in our clinic were 1.18 on the left Electronic Signature(s) Signed: 05/22/2019 5:09:02 PM By: Linton Ham MD Entered By: Linton Ham on 05/22/2019  14:41:22 -------------------------------------------------------------------------------- Physical Exam Details Patient Name: Date of Service: RAMIYA, VIADO 05/22/2019 1:15 PM Medical Record [id number] Patient Account Number: [id number] Date of Birth/Sex: Treating RN: [birth year] (69 y.o. Clearnce Sorrel Primary Care Provider: [name] Other Clinician: Referring Provider: Treating Provider/Extender:Quandre Polinski, Yehuda Savannah, Nat Math in Treatment: 0 Constitutional Sitting or standing Blood Pressure is within target range for patient.. Pulse regular and within target range for patient.Marland Kitchen Respirations regular, non-labored and within target range.. Temperature is normal and within the target range for the patient.Marland Kitchen Appears  in no distress. Eyes Conjunctivae clear. No discharge.no icterus. Respiratory work of breathing is normal. Cardiovascular Both dorsalis pedis and posterior tibial pulses are palpable on the left. Musculoskeletal Flexion contractures in the lower extremities and spasticity. Integumentary (Hair, Skin) No primary cutaneous disorders are seen. Psychiatric Severe dementia. Notes Wound exam; the patient had a fairly large blackened eschar over the wound which is on the tip of her heel. This was already separating. I used a #15 scalpel and pickups to remove this as well as some subcutaneous debris. After debridement it was fairly clear that there was a probing area laterally that went down to the bone itself making this a stage IV wound. This was swabbed cultured for CandS. There was no surrounding erythema. Patient tolerated the debridement well. Hemostasis with direct pressure Electronic Signature(s) Signed: 05/22/2019 5:09:02 PM By: Linton Ham MD Entered By: Linton Ham on 05/22/2019 14:43:07 -------------------------------------------------------------------------------- Physician Orders Details Patient Name: Date of Service: Ronaldo Miyamoto 05/22/2019 1:15 PM Medical Record [id number] Patient Account Number: [id number] Date of Birth/Sex: Treating RN: [birth year] (69 y.o. Clearnce Sorrel Primary Care Provider: [name] Other Clinician: Referring Provider: Treating Provider/Extender:Zaden Sako, Yehuda Savannah, Nat Math in Treatment: 0 Verbal / Phone Orders: No Diagnosis Coding Follow-up Appointments Return Appointment in 2 weeks. Dressing Change Frequency Wound #1 Left Calcaneus Change dressing every day. - HH to change twice a week Skin Barriers/Peri-Wound Care Wound #1 Left Calcaneus Skin Prep Wound Cleansing Wound #1 Left Calcaneus Clean wound with Wound Cleanser - or normal saline Primary Wound Dressing Wound #1 Left Calcaneus Santyl Ointment Secondary Dressing Wound #1 Left Calcaneus Kerlix/Rolled Gauze Heel Cup - lightly moistened saline gauze over santyl then heel cup Off-Loading Other: - wear boot to left foot [facility] for wound care. - [facility] Bacteria identified in Unspecified specimen by Anaerobe culture (MICRO) - Wound Culture of Left Heel LOINC Code: [id number] Convenience Name: Anerobic culture Radiology X-ray, heel - X-Ray of the left heel, possible osteomyelitis Electronic Signature(s) Signed: 05/22/2019 5:09:02 PM By: Linton Ham MD Signed: 05/22/2019 5:10:17 PM By: Kela Millin Entered By: Kela Millin on 05/22/2019 14:36:42 -------------------------------------------------------------------------------- Prescription 05/22/2019 Patient Name: Wynona Luna A. Provider: Linton Ham MD Date of Birth: [birth year] NPI#: [id number] Sex: F DEA#: [id number] Phone #: [phone] License #: [id number] Patient Address: [address] [place], [place] [address] [address], [place]  [address] [phone] Allergies latex Reaction: rash nickel Reaction: hives, itching cobalt Reaction: rash Eucerin Reaction: hives Provider's Orders X-ray, heel - X-Ray of the left heel, possible osteomyelitis Signature(s): Date(s): Electronic Signature(s) Signed: 05/22/2019 5:09:02 PM By: Linton Ham MD Signed: 05/22/2019 5:10:17 PM By: Kela Millin Entered By: Kela Millin on 05/22/2019 14:36:42 --------------------------------------------------------------------------------  Problem List Details Patient Name: Date of Service: Ronaldo Miyamoto 05/22/2019 1:15 PM Medical Record [id number] Patient Account Number: [id number] Date of Birth/Sex: Treating RN: Aug 26, 1950 (69 y.o. Clearnce Sorrel Primary Care Provider: [name] Other Clinician: Referring Provider: Treating Provider/Extender:Devone Tousley, Yehuda Savannah, Nat Math in Treatment: 0 Active Problems ICD-10 Evaluated Encounter Code Description Active Date Today Diagnosis L89.624 Pressure ulcer of left heel, stage 4 05/22/2019 No Yes F01.50 Vascular dementia without behavioral disturbance 05/22/2019 No Yes Inactive Problems Resolved Problems Electronic Signature(s) Signed: 05/22/2019 5:09:02 PM By: Linton Ham MD Entered By: Linton Ham on 05/22/2019 14:38:20 -------------------------------------------------------------------------------- Progress Note Details Patient Name: Date of Service: Ronaldo Miyamoto 05/22/2019 1:15 PM Medical Record  [id number] Patient Account Number: [id number] Date of Birth/Sex: Treating RN: 04/28/1950 (69 y.o. Clearnce Sorrel Primary Care Provider: [name] Other Clinician: Referring Provider: Treating Provider/Extender:Berdene Askari, Yehuda Savannah, Nat Math in Treatment: 0 Subjective Chief Complaint Information obtained from Patient 05/22/2019; patient is here for review of a pressure ulcer on the tip of her left heel History of  Present Illness (HPI) ADMISSION 05/22/2019 This is a 69 year old woman with advanced vascular dementia. She lives at [facility] assisted living and comes in with her caregiver. The story is that she fractured her right hip in November 2020 was hospitalized was discharged to [facility] skilled facility and then return to [facility] assisted living. Upon her return there she was discovered to have a wound on her left heel. She is being dressed with Santyl. [facility] has [facility]. The exact duration of this wound is not really clear. 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 ABIs in our clinic were 1.18 on the left Patient History Allergies latex (Reaction: rash), nickel (Reaction: hives, itching), cobalt (Reaction: rash), Eucerin (Reaction: hives) Family History Cancer - Mother, No family history of Diabetes, Heart Disease, Hereditary Spherocytosis, Hypertension, Kidney Disease, Lung Disease, Seizures, Stroke, Thyroid Problems, Tuberculosis. Social History Former smoker - quit 2 yrs ago, Marital Status - Divorced, Alcohol Use - Never - alcoholic, quit 2 yrs ago, Drug Use - Prior History - TCH, Caffeine Use - Daily - coffee. Medical History Eyes Patient has history of Cataracts - both eyes Denies history of Glaucoma, Optic Neuritis Hematologic/Lymphatic Patient has history of Anemia - iron deficiency Denies history of Hemophilia, Human Immunodeficiency Virus, Lymphedema, Sickle Cell Disease Respiratory Patient has history of Chronic Obstructive Pulmonary Disease (COPD) Denies history of Aspiration, Asthma, Pneumothorax, Sleep Apnea, Tuberculosis Endocrine Denies history of Type I Diabetes, Type II Diabetes Genitourinary Denies history of End Stage Renal Disease Integumentary (Skin) Denies history of  History of Burn Musculoskeletal Denies history of Gout, Rheumatoid Arthritis, Osteoarthritis Neurologic Patient has history of Dementia - alcoholic Denies history of Neuropathy, Quadriplegia, Paraplegia, Seizure Disorder Oncologic Denies history of Received Chemotherapy, Received Radiation Psychiatric Patient has history of Confinement Anxiety Denies history of Anorexia/bulimia Hospitalization/Surgery History - achilles tendon repair. - breast implants. - liposuction legs and tummy. - hip fracture reapir. - right hip IM nailing. - thumb surgery. Medical And Surgical History Notes Ear/Nose/Mouth/Throat allergic rhinitis Cardiovascular hypercholesteremia Gastrointestinal colonic polyps Genitourinary incontinence Musculoskeletal ankylosing spondylitis Neurologic scoliosis, right hip fx x2 Review of Systems (ROS) Constitutional Symptoms (General Health) Denies complaints or symptoms of Fatigue, Fever, Chills, Marked Weight Change. Eyes Complains or has symptoms of Glasses / Contacts. Denies complaints or symptoms of Dry Eyes, Vision Changes. Ear/Nose/Mouth/Throat Complains or has symptoms of Chronic sinus problems or rhinitis. Respiratory Denies complaints or symptoms of Chronic or frequent coughs, Shortness of Breath. Cardiovascular Denies complaints or symptoms of Chest pain. Gastrointestinal Denies complaints or symptoms of Frequent diarrhea, Nausea, Vomiting. Endocrine Denies complaints or symptoms of Heat/cold intolerance. Genitourinary Denies complaints or symptoms of Frequent urination. Integumentary (Skin) Complains or has symptoms of Wounds - left heel. Musculoskeletal Complains or has symptoms of Muscle Weakness. Denies complaints or symptoms of Muscle Pain. Psychiatric Complains or has symptoms of Claustrophobia. Denies complaints or symptoms of Suicidal. Objective Constitutional Sitting or standing Blood Pressure is within target range for patient.. Pulse  regular and within target range for patient.Marland Kitchen Respirations regular, non-labored and  within target range.. Temperature is normal and within the target range for the patient.Marland Kitchen Appears in no distress. Vitals Time Taken: 1:24 PM, Height: 67 in, Source: Stated, Weight: 107 lbs, Source: Stated, BMI: 16.8, Temperature: 98.0 F, Pulse: 82 bpm, Respiratory Rate: 18 breaths/min, Blood Pressure: 102/56 mmHg. Eyes Conjunctivae clear. No discharge.no icterus. Respiratory work of breathing is normal. Cardiovascular Both dorsalis pedis and posterior tibial pulses are palpable on the left. Musculoskeletal Flexion contractures in the lower extremities and spasticity. Psychiatric Severe dementia. General Notes: Wound exam; the patient had a fairly large blackened eschar over the wound which is on the tip of her heel. This was already separating. I used a #15 scalpel and pickups to remove this as well as some subcutaneous debris. After debridement it was fairly clear that there was a probing area laterally that went down to the bone itself making this a stage IV wound. This was swabbed cultured for CandS. There was no surrounding erythema. Patient tolerated the debridement well. Hemostasis with direct pressure Integumentary (Hair, Skin) No primary cutaneous disorders are seen. Wound #1 status is Open. Original cause of wound was Pressure Injury. The wound is located on the Left Calcaneus. The wound measures 1.9cm length x 2.6cm width x 0.5cm depth; 3.88cm^2 area and 1.94cm^3 volume. There is Fat Layer (Subcutaneous Tissue) Exposed exposed. There is no tunneling or undermining noted. There is a small amount of serosanguineous drainage noted. The wound margin is flat and intact. There is no granulation within the wound bed. There is a large (67-100%) amount of necrotic tissue within the wound bed including Adherent Slough. Assessment Active Problems ICD-10 Pressure ulcer of left heel, stage 4 Vascular  dementia without behavioral disturbance Procedures Wound #1 Pre-procedure diagnosis of Wound #1 is a Pressure Ulcer located on the Left Calcaneus . There was a Excisional Skin/Subcutaneous Tissue Debridement with a total area of 4.94 sq cm performed by Ricard Dillon., MD. With the following instrument(s): Curette to remove Viable and Non-Viable tissue/material. Material removed includes Eschar, Subcutaneous Tissue, and Slough after achieving pain control using Other (benzocaine, 20%). No specimens were taken. A time out was conducted at 14:20, prior to the start of the procedure. A Minimum amount of bleeding was controlled with Pressure. The procedure was tolerated well with a pain level of 1 throughout and a pain level of 0 following the procedure. Post Debridement Measurements: 1.9cm length x 2.6cm width x 1cm depth; 3.88cm^3 volume. Post debridement Stage noted as Unstageable/Unclassified. Character of Wound/Ulcer Post Debridement requires further debridement. Post procedure Diagnosis Wound #1: Same as Pre-Procedure Plan Follow-up Appointments: Return Appointment in 2 weeks. Dressing Change Frequency: Wound #1 Left Calcaneus: Change dressing every day. - HH to change twice a week Skin Barriers/Peri-Wound Care: Wound #1 Left Calcaneus: Skin Prep Wound Cleansing: Wound #1 Left Calcaneus: Clean wound with Wound Cleanser - or normal saline Primary Wound Dressing: Wound #1 Left Calcaneus: Santyl Ointment Secondary Dressing: Wound #1 Left Calcaneus: Kerlix/Rolled Gauze Heel Cup - lightly moistened saline gauze over santyl then heel cup Off-Loading: Other: - wear boot to left foot Home Health: [facility] for wound care. - [facility] ordered were: X-ray, heel - X-Ray of the left heel, possible osteomyelitis Laboratory ordered were: Anerobic culture - Wound Culture of Left Heel 1. I agree with Santyl ointment as the continued dressing 2. Heel  cup, rolled gauze 3. This will need to be done daily. They have [facility] 4. Because of the depth of this  I am going to see if I can get an x-ray done in the facility of the heel 5. Swab culture of the deep area for CandS but no empiric antibiotics 6. Her sister [name] who we talked to with Face[employer] told me that they are considering putting her on hospice care in the facility. But I think that is reasonable for many patients including probably this 1 I let her know that the responsibility for wound care would then shift to hospice I spent 35 minutes in review of this patient's record face-to-face evaluation, discussion with her sister via FaceTime and preparation of this record Electronic Signature(s) Signed: 05/22/2019 5:09:02 PM By: Linton Ham MD Entered By: Linton Ham on 05/22/2019 14:45:01 -------------------------------------------------------------------------------- HxROS Details Patient Name: Date of Service: Ronaldo Miyamoto 05/22/2019 1:15 PM Medical Record [id number] Patient Account Number: [id number] Date of Birth/Sex: Treating RN: 02/26/1951 (69 y.o. [name] Primary Care Provider: [name] Other Clinician: Referring Provider: Treating Provider/Extender:Yailyn Strack, Yehuda Savannah, Nat Math in Treatment: 0 Constitutional Symptoms (General Health) Complaints and Symptoms: Negative for: Fatigue; Fever; Chills; Marked Weight Change Eyes Complaints and Symptoms: Positive for: Glasses / Contacts Negative for: Dry Eyes; Vision Changes Medical History: Positive for: Cataracts - both eyes Negative for: Glaucoma; Optic Neuritis Ear/Nose/Mouth/Throat Complaints and Symptoms: Positive for: Chronic sinus problems or rhinitis Medical History: Past Medical History Notes: allergic rhinitis Respiratory Complaints and Symptoms: Negative for: Chronic or frequent coughs; Shortness of Breath Medical History: Positive for: Chronic  Obstructive Pulmonary Disease (COPD) Negative for: Aspiration; Asthma; Pneumothorax; Sleep Apnea; Tuberculosis Cardiovascular Complaints and Symptoms: Negative for: Chest pain Medical History: Past Medical History Notes: hypercholesteremia Gastrointestinal Complaints and Symptoms: Negative for: Frequent diarrhea; Nausea; Vomiting Medical History: Past Medical History Notes: colonic polyps Endocrine Complaints and Symptoms: Negative for: Heat/cold intolerance Medical History: Negative for: Type I Diabetes; Type II Diabetes Genitourinary Complaints and Symptoms: Negative for: Frequent urination Medical History: Negative for: End Stage Renal Disease Past Medical History Notes: incontinence Integumentary (Skin) Complaints and Symptoms: Positive for: Wounds - left heel Medical History: Negative for: History of Burn Musculoskeletal Complaints and Symptoms: Positive for: Muscle Weakness Negative for: Muscle Pain Medical History: Negative for: Gout; Rheumatoid Arthritis; Osteoarthritis Past Medical History Notes: ankylosing spondylitis Psychiatric Complaints and Symptoms: Positive for: Claustrophobia Negative for: Suicidal Medical History: Positive for: Confinement Anxiety Negative for: Anorexia/bulimia Hematologic/Lymphatic Medical History: Positive for: Anemia - iron deficiency Negative for: Hemophilia; Human Immunodeficiency Virus; Lymphedema; Sickle Cell Disease Immunological Neurologic Medical History: Positive for: Dementia - alcoholic Negative for: Neuropathy; Quadriplegia; Paraplegia; Seizure Disorder Past Medical History Notes: scoliosis, right hip fx x2 Oncologic Medical History: Negative for: Received Chemotherapy; Received Radiation HBO Extended History Items Eyes: Cataracts Immunizations Pneumococcal Vaccine: Received Pneumococcal Vaccination: Yes Implantable Devices Yes Hospitalization / Surgery History Type of  Hospitalization/Surgery achilles tendon repair breast implants liposuction legs and tummy hip fracture reapir right hip IM nailing thumb surgery Family and Social History Cancer: Yes - Mother; Diabetes: No; Heart Disease: No; Hereditary Spherocytosis: No; Hypertension: No; Kidney Disease: No; Lung Disease: No; Seizures: No; Stroke: No; Thyroid Problems: No; Tuberculosis: No; Former smoker - quit 2 yrs ago; Marital Status - Divorced; Alcohol Use: Never - alcoholic, quit 2 yrs ago; Drug Use: Prior History - Sargeant; Caffeine Use: Daily - coffee; Financial Concerns: No; Food, Clothing or Shelter Needs: No; Support System Lacking: No; Transportation Concerns: No Engineer, maintenance) Signed: 05/22/2019 5:09:02 PM By: Linton Ham MD Signed: 05/26/2019 5:59:14 PM By: Baruch Gouty RN, BSN Entered By: Baruch Gouty on  05/22/2019 13:38:25 -------------------------------------------------------------------------------- SuperBill Details Patient Name: Date of Service: GISEL, DEWINTER 05/22/2019 Medical Record [id number] Patient Account Number: [id number] Date of Birth/Sex: Treating RN: 05/13/1950 (69 y.o. Clearnce Sorrel Primary Care Provider: [name] Other Clinician: Referring Provider: Treating Provider/Extender:Kylah Maresh, Yehuda Savannah, Nat Math in Treatment: 0 Diagnosis Coding ICD-10 Codes Code Description [id number] Pressure ulcer of left heel, stage 4 F01.50 Vascular dementia without behavioral disturbance Facility Procedures CPT4 Code: [id number] Description: [facility] VISIT-LEV 3 EST PT Modifier: 25 Quantity: 1 CPT4 Code: [id number] Description: [id number] - DEB SUBQ TISSUE 20 SQ CM/< ICD-10 Diagnosis Description L89.624 Pressure ulcer of left heel, stage 4 Modifier: Quantity: 1 Physician Procedures CPT4 Code: [id number] Description: WC PHYS LEVEL 3 NEW PT ICD-10 Diagnosis Description L89.624 Pressure ulcer of left heel, stage 4 F01.50 Vascular dementia  without behavioral disturbance Modifier: 25 Quantity: 1 CPT4 Code: [id number] Description: 11042 - WC PHYS SUBQ TISS 20 SQ CM 1 ICD-10 Diagnosis Description L89.624 Pressure ulcer of left heel, stage 4 Modifier: Quantity: Electronic Signature(s) Signed: 05/28/2019 6:03:33 PM By: Linton Ham MD Signed: 05/28/2019 6:20:13 PM By: Kela Millin Previous Signature: 05/22/2019 5:09:02 PM Version By: Linton Ham MD Entered By: Kela Millin on 05/28/2019 14:34:06

## 2019-05-26 NOTE — Progress Notes (Signed)
Desiree Salinas, Desiree Salinas ([id number]) Visit Report for 05/22/2019 Abuse/Suicide Risk Screen Details Patient Name: Date of Service: Desiree Salinas, Desiree Salinas 05/22/2019 1:15 PM Medical Record [id number] Patient Account Number: [id number] Date of Birth/Sex: Treating RN: Dec 07, 1950 (69 y.o. [name] Primary Care Jayvyn Haselton: [name] Other Clinician: Referring Clariza Sickman: Treating Arianne Klinge/Extender:Robson, Yehuda Savannah, Nat Math in Treatment: 0 Abuse/Suicide Risk Screen Items Answer ABUSE RISK SCREEN: Has anyone close to you tried to hurt or harm you recentlyo No Do you feel uncomfortable with anyone in your familyo No Has anyone forced you do things that you didnt want to doo No Electronic Signature(s) Signed: 05/26/2019 5:59:14 PM By: Baruch Gouty RN, BSN Entered By: Baruch Gouty on 05/22/2019 13:38:33 -------------------------------------------------------------------------------- Activities of Daily Living Details Patient Name: Date of Service: Desiree Salinas, Desiree Salinas 05/22/2019 1:15 PM Medical Record [id number] Patient Account Number: [id number] Date of Birth/Sex: Treating RN: 1950/03/27 (69 y.o. [name] Primary Care Zionah Criswell: [name] Other Clinician: Referring Sinia Antosh: Treating Heela Heishman/Extender:Robson, Yehuda Savannah, Nat Math in Treatment: 0 Activities of Daily Living Items Answer Activities of Daily Living (Please select one for each item) Drive Automobile Not Able Take Medications Need Assistance Use Telephone Not Able Care for Appearance Need Assistance Use Toilet Need Assistance Bath / Shower Need Assistance Dress Self Need Assistance Feed Self Completely Able Walk Not Able Get In / Out Bed Need Assistance Housework Not Able Prepare Meals Not Able Handle Money Not Able Shop for Self Not Able Electronic Signature(s) Signed: 05/26/2019 5:59:14 PM By: Baruch Gouty RN, BSN Entered By: Baruch Gouty on 05/22/2019  13:39:06 -------------------------------------------------------------------------------- Education Screening Details Patient Name: Date of Service: Desiree Salinas 05/22/2019 1:15 PM Medical Record [id number] Patient Account Number: [id number] Date of Birth/Sex: Treating RN: 05/04/1950 (69 y.o. [name] Primary Care Jaydyn Bozzo: [name] Other Clinician: Referring Loralai Eisman: Treating Kynslee Baham/Extender:Robson, Yehuda Savannah, Nat Math in Treatment: 0 Primary Learner Assessed: Caregiver Reason Patient is not Primary Learner: dementia Learning Preferences/Education Level/Primary Language Learning Preference: Explanation, Demonstration, Printed Material Highest Education Level: College or Above Preferred Language: Diplomatic Services operational officer Language Barrier: No Translator Needed: No Memory Deficit: No Emotional Barrier: No Cultural/Religious Beliefs Affecting Medical Care: No Physical Barrier Impaired Vision: Yes Glasses Impaired Hearing: No Decreased Hand dexterity: No Motivation Anxiety Level: Calm Cooperation: Cooperative Education Importance: Acknowledges Need Interest in Health Problems: Asks Questions Perception: Actuary) Signed: 05/26/2019 5:59:14 PM By: Baruch Gouty RN, BSN Entered By: Baruch Gouty on 05/22/2019 13:40:11 -------------------------------------------------------------------------------- Fall Risk Assessment Details Patient Name: Date of Service: Desiree Salinas 05/22/2019 1:15 PM Medical Record [id number] Patient Account Number: [id number] Date of Birth/Sex: Treating RN: Feb 14, 1951 (69 y.o. [name] Primary Care Chaylee Ehrsam: [name] Other Clinician: Referring Treesa Mccully: Treating Stefano Trulson/Extender:Robson, Yehuda Savannah, Nat Math in Treatment: 0 Fall Risk Assessment Items Have you had 2 or more falls in the last 12 monthso 0 Yes Have you had any fall that resulted in injury  in the last 12 monthso 0 Yes FALLS RISK SCREEN History of falling - immediate or within 3 months 25 Yes Secondary diagnosis (Do you have 2 or more medical diagnoseso) 0 No Ambulatory aid None/bed rest/wheelchair/nurse 0 Yes Crutches/cane/walker 0 No Furniture 0 No Intravenous therapy Access/Saline/Heparin Lock 0 No Weak (short steps with or without shuffle, stooped but able to lift head 0 No while walking, may seek support from furniture) Impaired (short steps with shuffle, may have difficulty arising from chair, 20 Yes head down, impaired balance) Mental Status Oriented to own ability 0 No Overestimates or forgets  limitations 15 Yes Risk Level: High Risk Score: 60 Electronic Signature(s) Signed: 05/26/2019 5:59:14 PM By: Baruch Gouty RN, BSN Entered By: Baruch Gouty on 05/22/2019 13:40:51 -------------------------------------------------------------------------------- Foot Assessment Details Patient Name: Date of Service: Desiree Salinas 05/22/2019 1:15 PM Medical Record [id number] Patient Account Number: [id number] Date of Birth/Sex: Treating RN: 1950-11-22 (69 y.o. [name] Primary Care Dalonda Simoni: [name] Other Clinician: Referring Duell Holdren: Treating Myia Bergh/Extender:Robson, Yehuda Savannah, Nat Math in Treatment: 0 Foot Assessment Items [x]  Unable to perform due to altered mental status Site Locations + = Sensation present, - = Sensation absent, C = Callus, U = Ulcer R = Redness, W = Warmth, M = Maceration, PU = Pre-ulcerative lesion F = Fissure, S = Swelling, D = Dryness Assessment Right: Left: Other Deformity: No No Prior Foot Ulcer: No No Prior Amputation: No No Charcot Joint: No No Ambulatory Status: Non-ambulatory Assistance Device: Wheelchair Gait: Engineer, maintenance) Signed: 05/26/2019 5:59:14 PM By: Baruch Gouty RN, BSN Entered By: Baruch Gouty on 05/22/2019  13:42:22 -------------------------------------------------------------------------------- Nutrition Risk Screening Details Patient Name: Date of Service: Desiree Salinas, Desiree Salinas 05/22/2019 1:15 PM Medical Record [id number] Patient Account Number: [id number] Date of Birth/Sex: Treating RN: [birth year] (69 y.o. [name] Primary Care Buzz Axel: [name] Other Clinician: Referring Alexsandria Kivett: Treating Hussien Greenblatt/Extender:Robson, Yehuda Savannah, Scott Weeks in Treatment: 0 Height (in): 67 Weight (lbs): 107 Body Mass Index (BMI): 16.8 Nutrition Risk Screening Items Score Screening NUTRITION RISK SCREEN: I have an illness or condition that made me change the kind and/or 0 No amount of food I eat I eat fewer than two meals per day 0 No I eat few fruits and vegetables, or milk products 0 No I have three or more drinks of beer, liquor or wine almost every day 0 No I have tooth or mouth problems that make it hard for me to eat 0 No I don't always have enough money to buy the food I need 0 No I eat alone most of the time 0 No I take three or more different prescribed or over-the-counter drugs a day 1 Yes 2 Yes Without wanting to, I have lost or gained 10 pounds in the last six months I am not always physically able to shop, cook and/or feed myself 0 No Nutrition Protocols Good Risk Protocol Provide education on Moderate Risk Protocol 0 nutrition High Risk Proctocol Risk Level: Moderate Risk Score: 3 Electronic Signature(s) Signed: 05/26/2019 5:59:14 PM By: Baruch Gouty RN, BSN Entered By: Baruch Gouty on 05/22/2019 13:41:31

## 2019-05-27 ENCOUNTER — Ambulatory Visit: Payer: Medicare Other | Admitting: Family

## 2019-06-08 ENCOUNTER — Encounter (HOSPITAL_BASED_OUTPATIENT_CLINIC_OR_DEPARTMENT_OTHER): Payer: Medicare Other | Admitting: Internal Medicine

## 2019-07-20 DIAGNOSIS — Z79899 Other long term (current) drug therapy: Secondary | ICD-10-CM | POA: Diagnosis not present

## 2019-07-20 DIAGNOSIS — N390 Urinary tract infection, site not specified: Secondary | ICD-10-CM | POA: Diagnosis not present

## 2019-07-27 DIAGNOSIS — B351 Tinea unguium: Secondary | ICD-10-CM | POA: Diagnosis not present

## 2019-07-27 DIAGNOSIS — M79674 Pain in right toe(s): Secondary | ICD-10-CM | POA: Diagnosis not present

## 2019-07-27 DIAGNOSIS — M79675 Pain in left toe(s): Secondary | ICD-10-CM | POA: Diagnosis not present

## 2019-10-06 DIAGNOSIS — M79674 Pain in right toe(s): Secondary | ICD-10-CM | POA: Diagnosis not present

## 2019-10-06 DIAGNOSIS — B351 Tinea unguium: Secondary | ICD-10-CM | POA: Diagnosis not present

## 2019-10-06 DIAGNOSIS — M79675 Pain in left toe(s): Secondary | ICD-10-CM | POA: Diagnosis not present

## 2019-11-26 DIAGNOSIS — Z23 Encounter for immunization: Secondary | ICD-10-CM | POA: Diagnosis not present

## 2019-12-08 DIAGNOSIS — Z20828 Contact with and (suspected) exposure to other viral communicable diseases: Secondary | ICD-10-CM | POA: Diagnosis not present

## 2019-12-17 DIAGNOSIS — Z20828 Contact with and (suspected) exposure to other viral communicable diseases: Secondary | ICD-10-CM | POA: Diagnosis not present

## 2019-12-31 IMAGING — CT CT HEAD W/O CM
3 of 4 series · 16 of 47 positions shown, 19 images · non-contrast
Comparison: Head CT dated 11/13/2018

CLINICAL DATA: 68-year-old female with dementia and fall.

EXAM:
CT HEAD WITHOUT CONTRAST
TECHNIQUE: Contiguous axial images were obtained from the base of the skull
through the vertex without intravenous contrast.

[Series 2: head 5.0 h30s · axial · 0.41mm/px · z∈[-164,-29]mm · 10 of 34 slices shown, 13 images]
[im 4/34  brain]
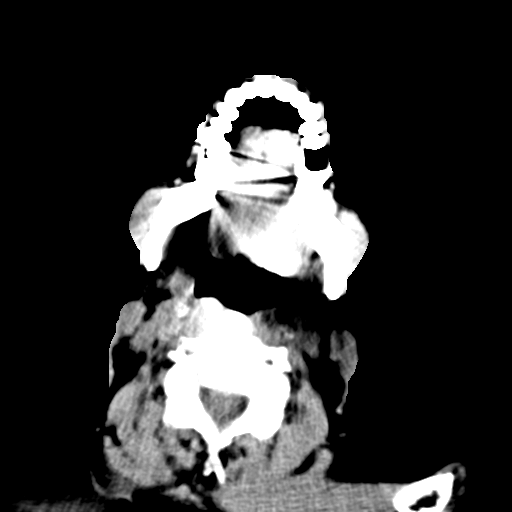
[im 4/34  bone]
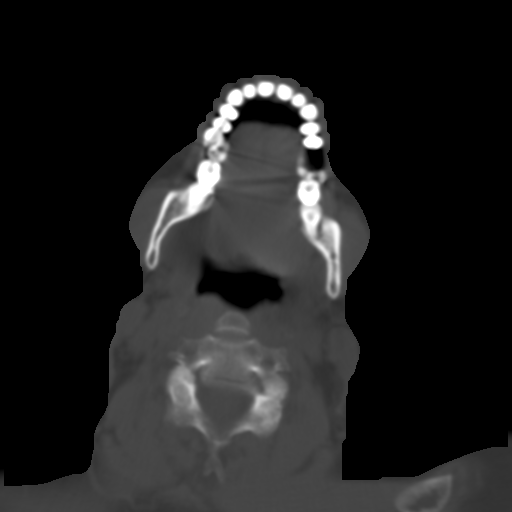
[im 7/34  brain]
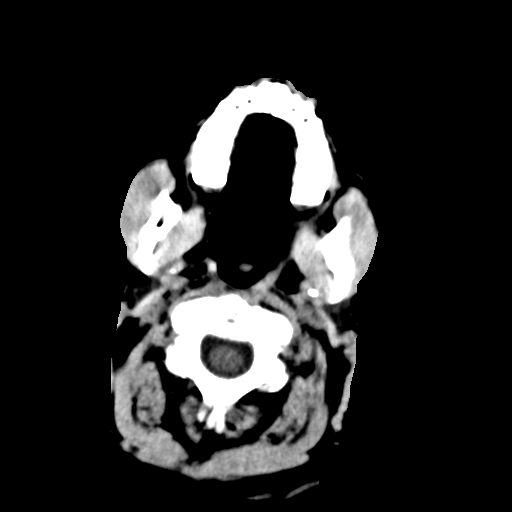
[im 10/34  brain]
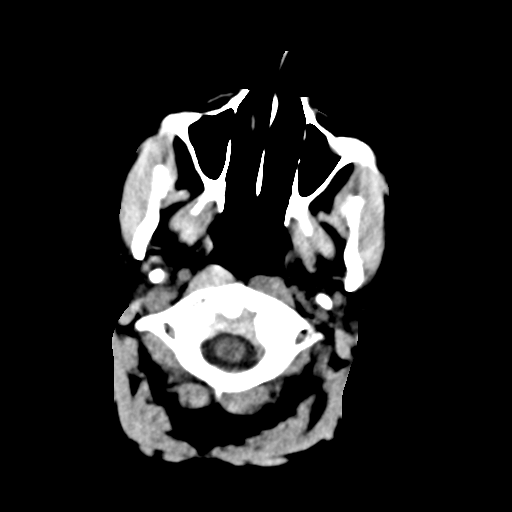
[im 13/34  brain]
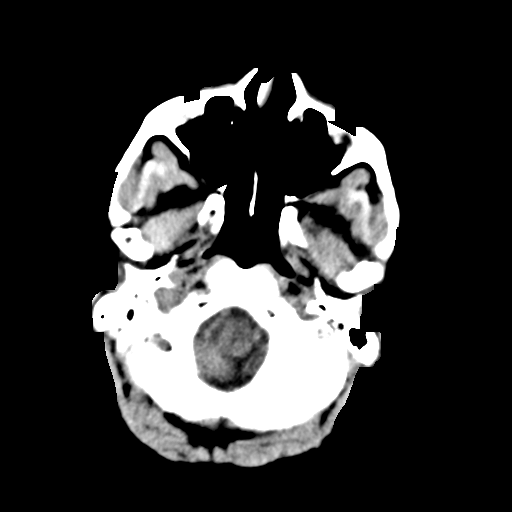
[im 16/34  brain]
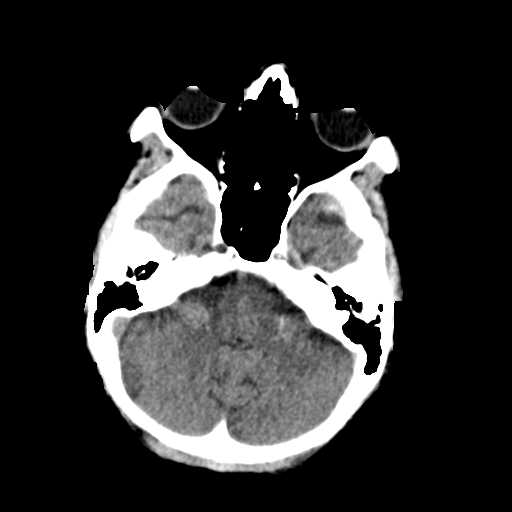
[im 16/34  bone]
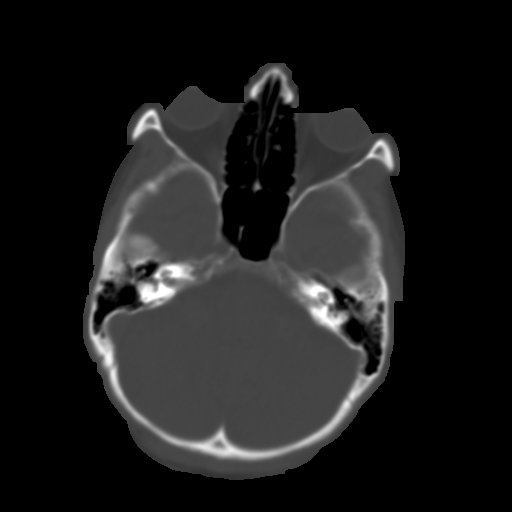
[im 19/34  brain]
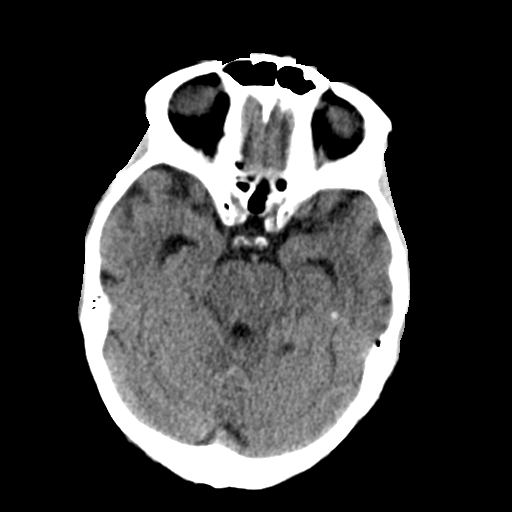
[im 22/34  brain]
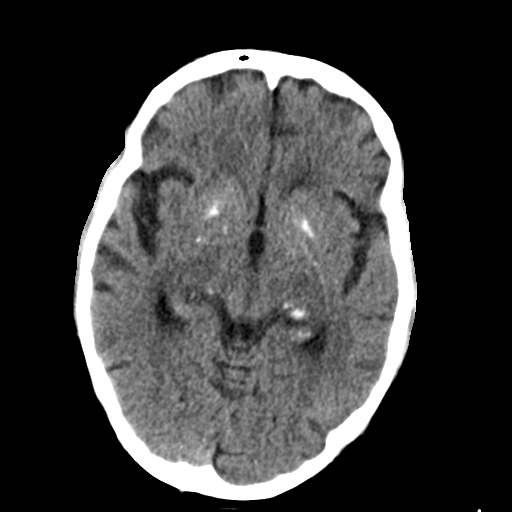
[im 25/34  brain]
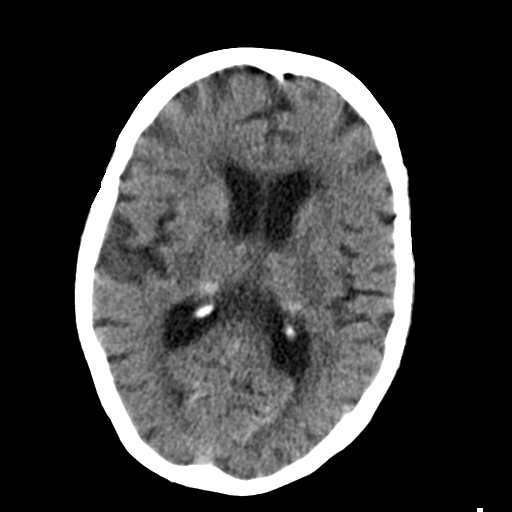
[im 28/34  brain]
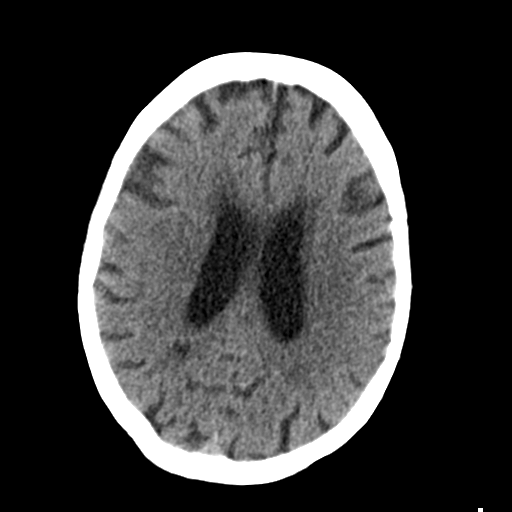
[im 28/34  bone]
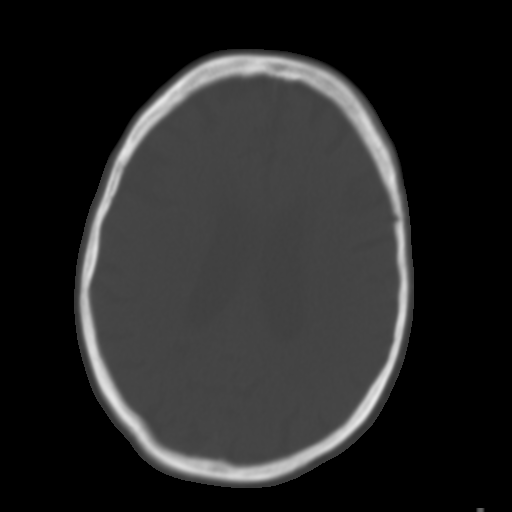
[im 31/34  brain]
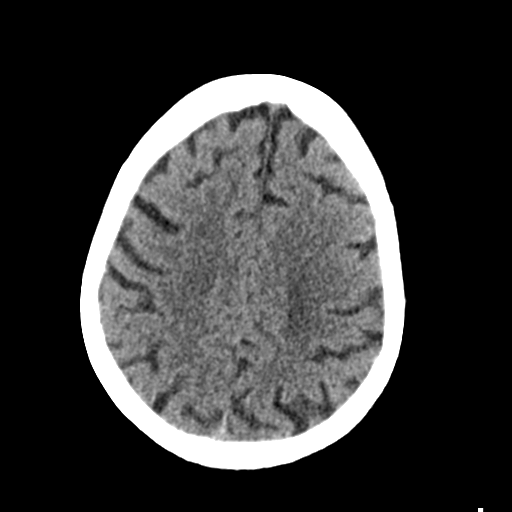

[Series 4: head 3.0 mpr cor · coronal · 0.32mm/px · 3 of 67 slices shown]
[im 23/67  brain]
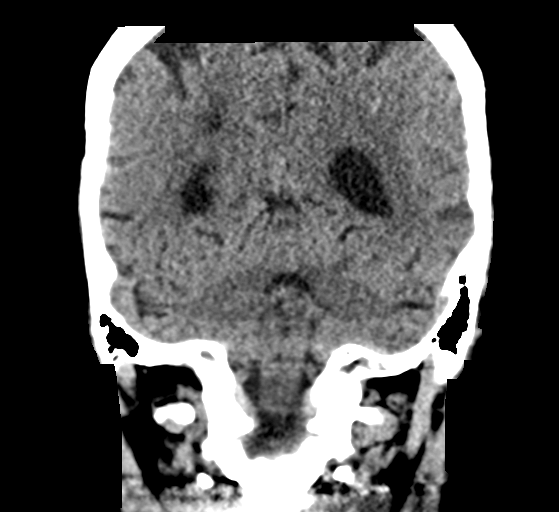
[im 30/67  brain]
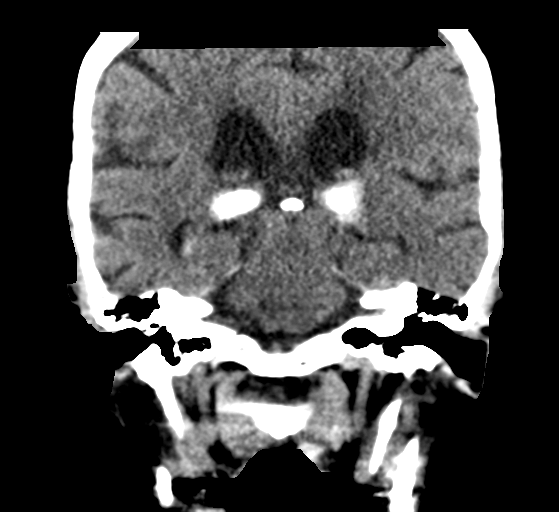
[im 37/67  brain]
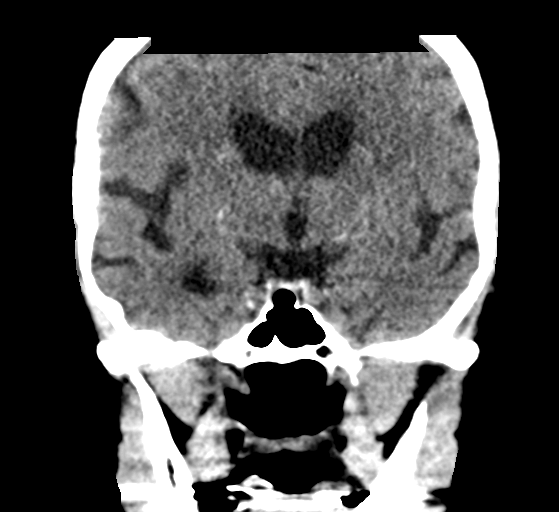

[Series 5: head 3.0 mpr sag · sagittal · 0.36mm/px · 3 of 55 slices shown]
[im 19/55  brain]
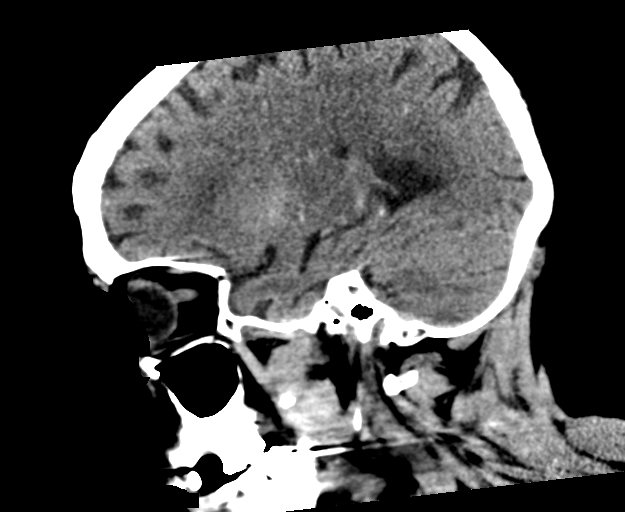
[im 28/55  brain]
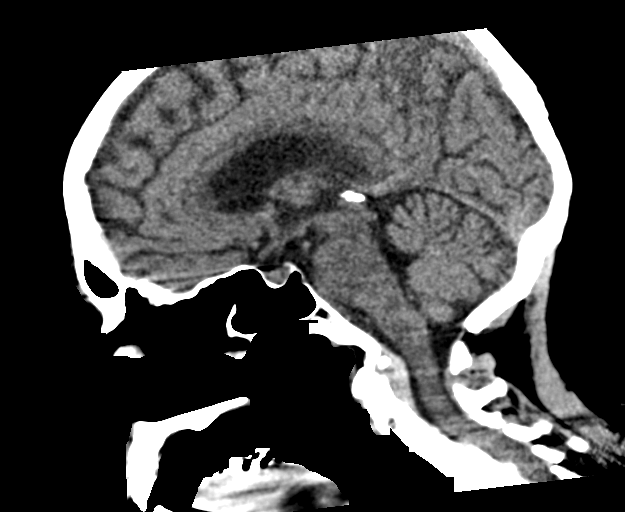
[im 37/55  brain]
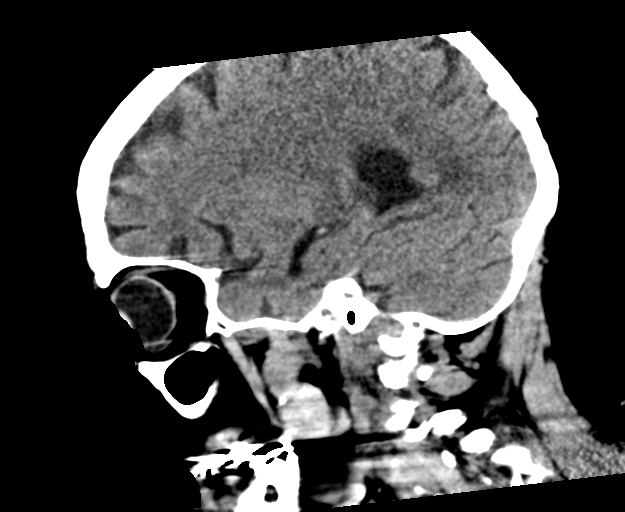

[16 of 47 positions shown; findings below may reference images not displayed]

FINDINGS: Brain: Mild age-related atrophy and chronic microvascular ischemic
changes. Bilateral basal ganglia calcifications as well as cortical
laminar calcification in the occipital lobes and areas of
calcification in the cerebellum noted, similar to prior CT. There is
no acute intracranial hemorrhage. No mass effect or midline shift.
No extra-axial fluid collection.

Vascular: No hyperdense vessel or unexpected calcification.

Skull: Normal. Negative for fracture or focal lesion.

Sinuses/Orbits: No acute finding.

Other: None
IMPRESSION: 1. No acute intracranial hemorrhage.
2. Age-related atrophy and chronic microvascular ischemic changes.

## 2020-01-29 IMAGING — DX DG CHEST 1V PORT
1 series · 1 of 1 positions shown · non-contrast
Comparison: Chest radiograph dated 11/25/2018

CLINICAL DATA: Leukocytosis

EXAM:
PORTABLE CHEST 1 VIEW

[chest]
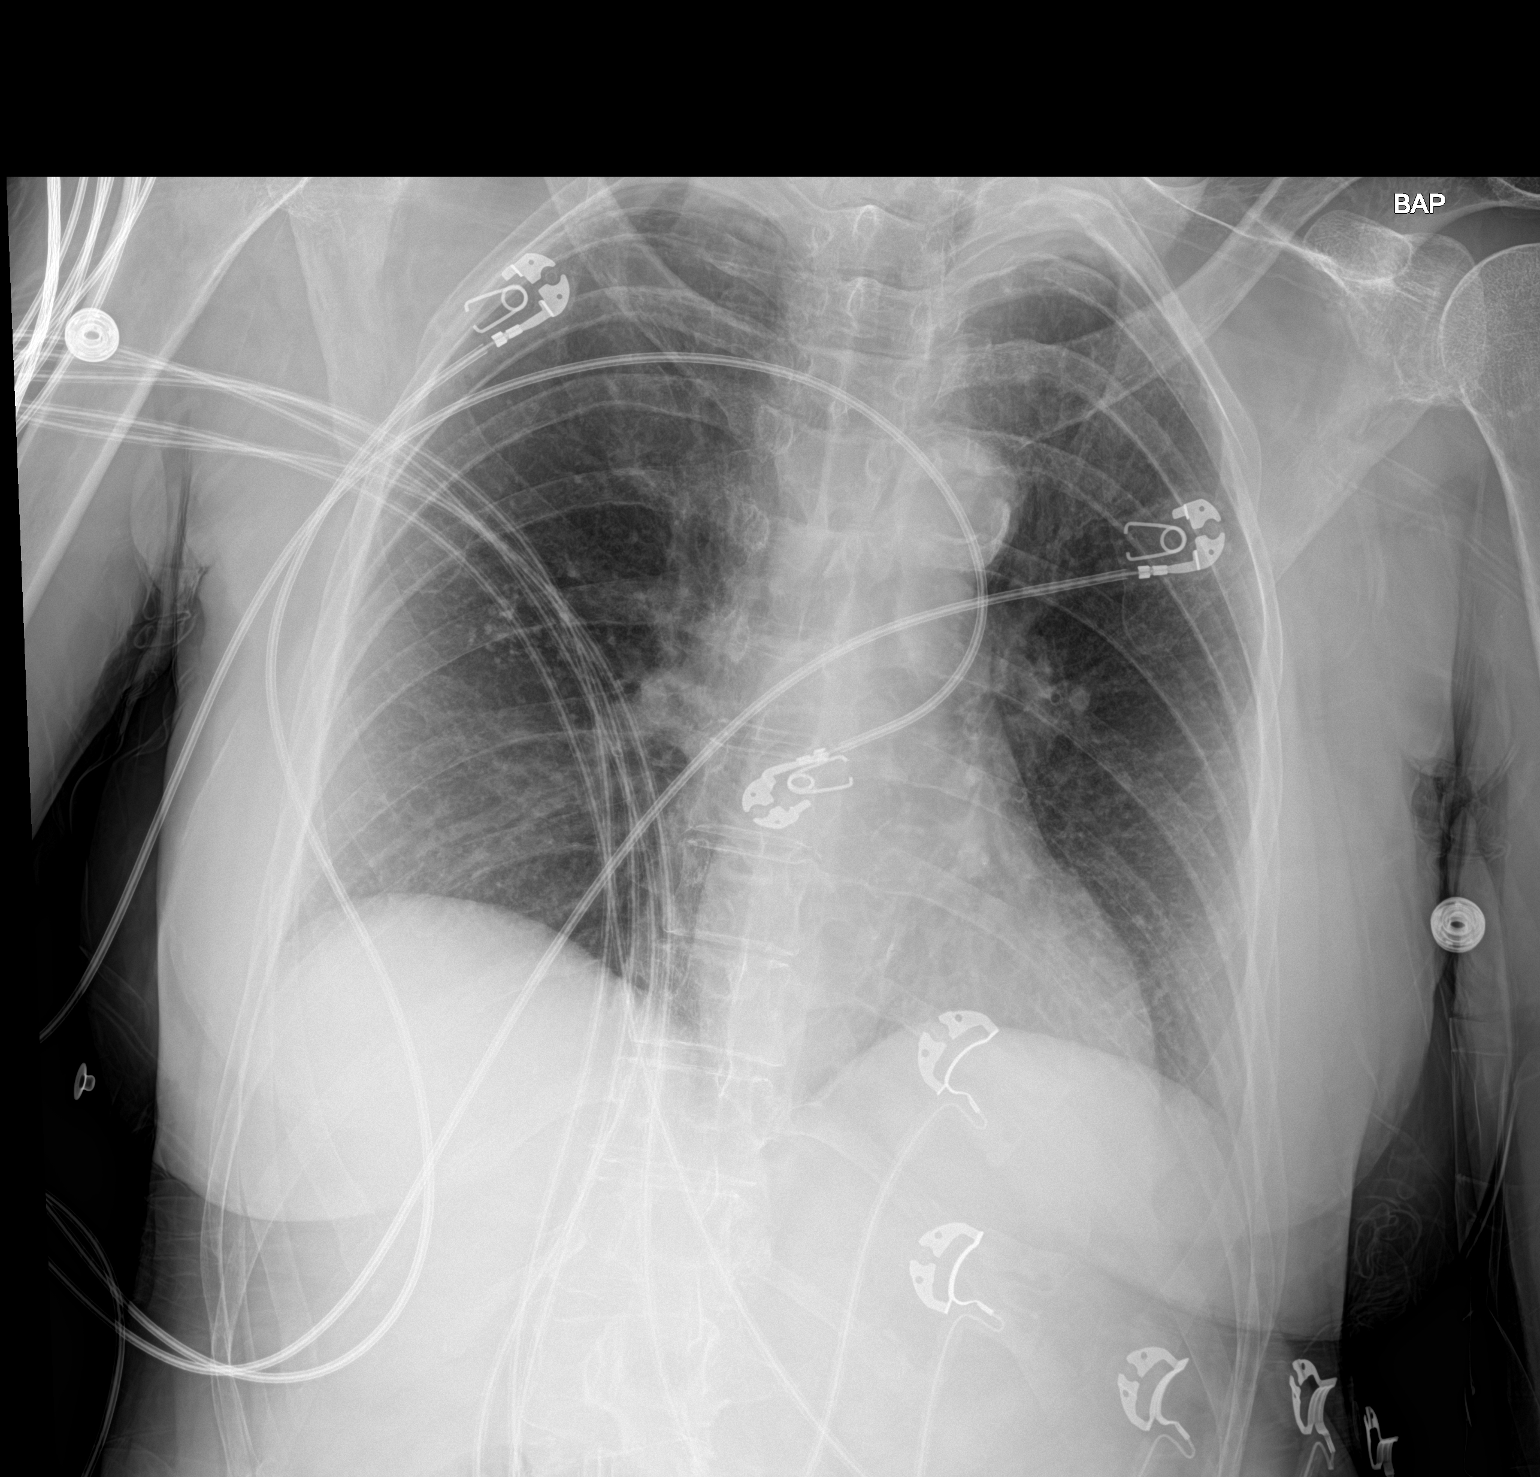

[1 of 1 positions shown; findings below may reference images not displayed]

FINDINGS: The heart size is normal. Vascular calcifications are seen in the
aortic arch. Both lungs are clear. The visualized skeletal
structures are unremarkable.
IMPRESSION: No active disease.

## 2020-02-09 IMAGING — DX DG CHEST 1V PORT
1 series · 1 of 1 positions shown · non-contrast
Comparison: Portable exam 5944 hours compared to 12/24/2018

CLINICAL DATA: Systemic inflammatory response syndrome

EXAM:
PORTABLE CHEST 1 VIEW

[chest]
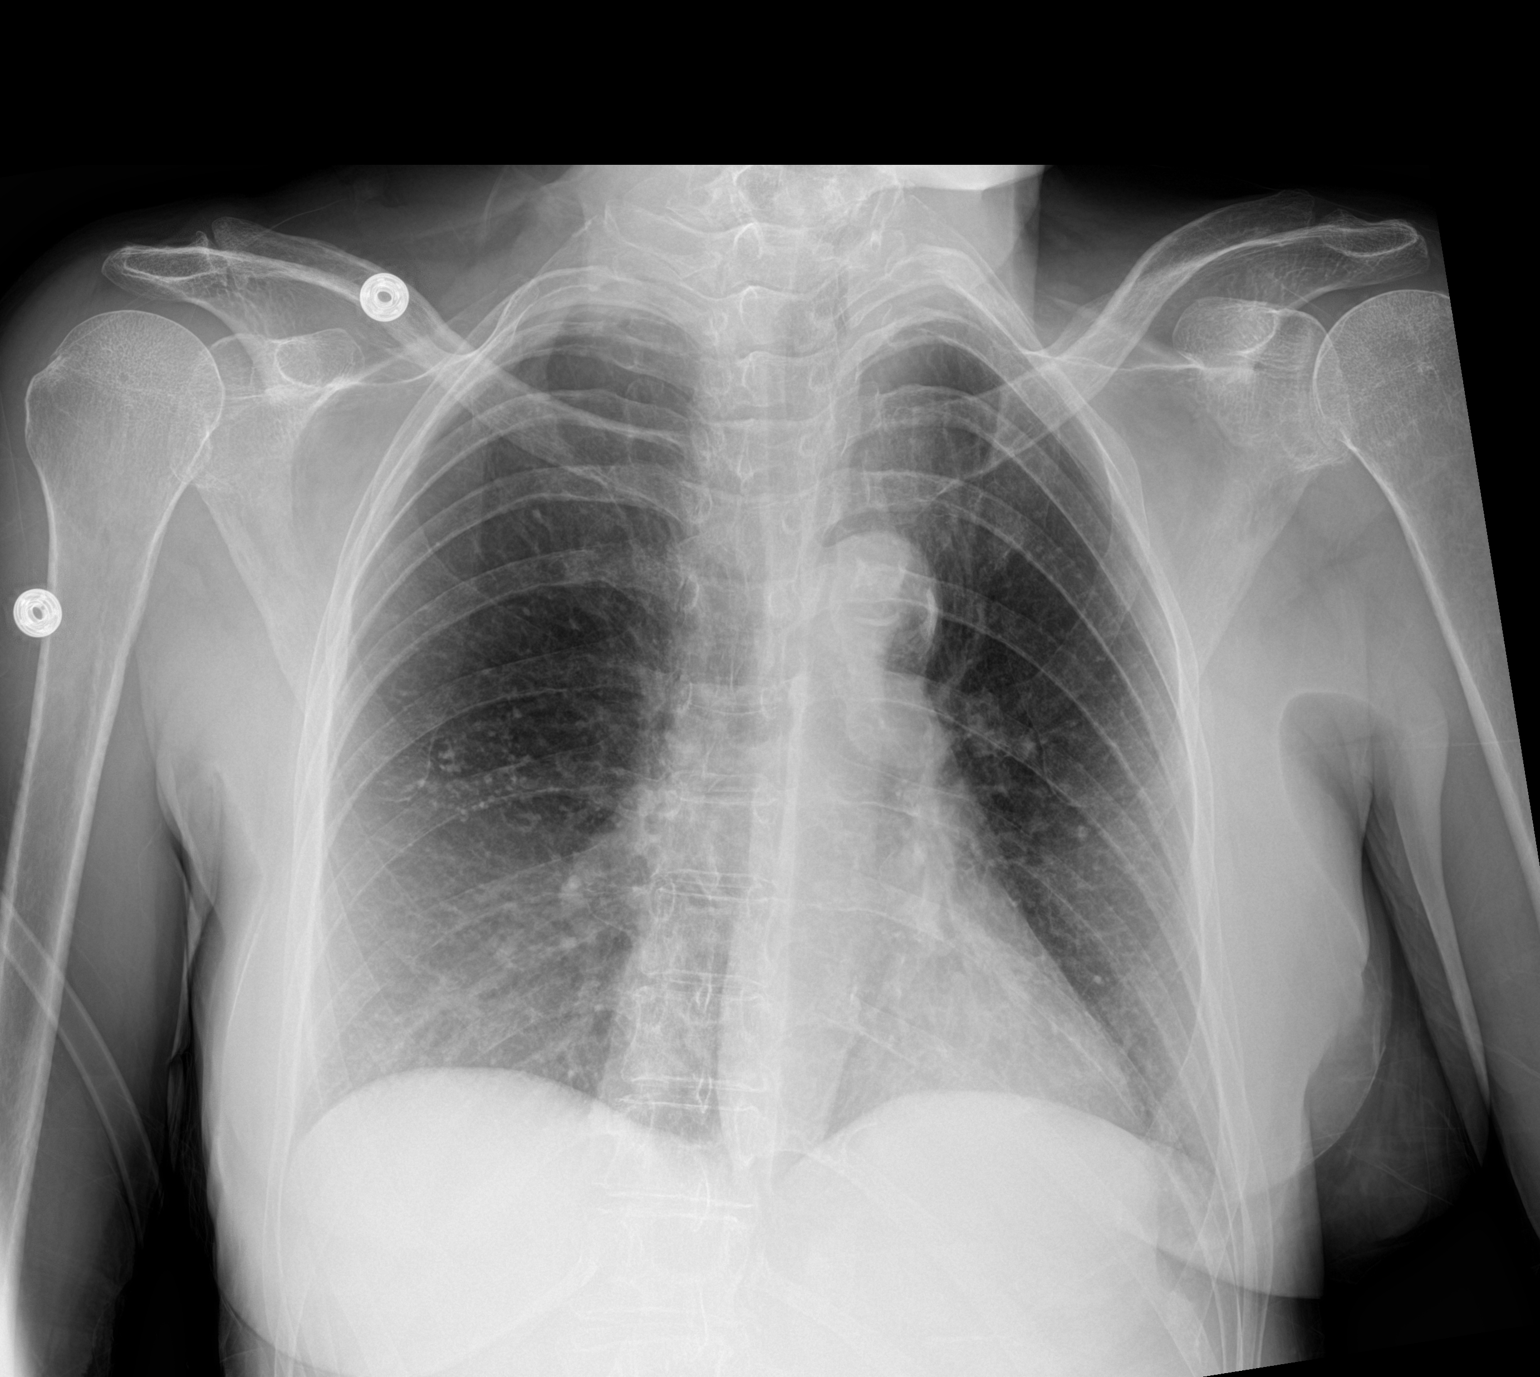

[1 of 1 positions shown; findings below may reference images not displayed]

FINDINGS: Normal heart size, mediastinal contours, and pulmonary vascularity.

Atherosclerotic calcification aorta.

Rotated to the LEFT.

Calcified granulomata RIGHT mid lung.

No infiltrate, pleural effusion or pneumothorax.

Bones demineralized.
IMPRESSION: No acute abnormalities.

## 2020-02-22 DIAGNOSIS — Z23 Encounter for immunization: Secondary | ICD-10-CM | POA: Diagnosis not present

## 2020-03-15 DIAGNOSIS — Z20828 Contact with and (suspected) exposure to other viral communicable diseases: Secondary | ICD-10-CM | POA: Diagnosis not present

## 2020-03-23 DIAGNOSIS — Z03818 Encounter for observation for suspected exposure to other biological agents ruled out: Secondary | ICD-10-CM | POA: Diagnosis not present

## 2020-03-30 DIAGNOSIS — Z20828 Contact with and (suspected) exposure to other viral communicable diseases: Secondary | ICD-10-CM | POA: Diagnosis not present

## 2020-04-08 DIAGNOSIS — Z20828 Contact with and (suspected) exposure to other viral communicable diseases: Secondary | ICD-10-CM | POA: Diagnosis not present

## 2020-04-15 DIAGNOSIS — Z20828 Contact with and (suspected) exposure to other viral communicable diseases: Secondary | ICD-10-CM | POA: Diagnosis not present

## 2020-04-26 DIAGNOSIS — Z20828 Contact with and (suspected) exposure to other viral communicable diseases: Secondary | ICD-10-CM | POA: Diagnosis not present

## 2020-05-19 DIAGNOSIS — M79674 Pain in right toe(s): Secondary | ICD-10-CM | POA: Diagnosis not present

## 2020-05-19 DIAGNOSIS — M79675 Pain in left toe(s): Secondary | ICD-10-CM | POA: Diagnosis not present

## 2020-05-19 DIAGNOSIS — B351 Tinea unguium: Secondary | ICD-10-CM | POA: Diagnosis not present

## 2020-09-02 DEATH — deceased
